# Patient Record
Sex: Female | Born: 1955
Health system: Southern US, Community
[De-identification: ages and names within clinical notes are randomized; demographics above are authoritative.]

## PROBLEM LIST (undated history)

## (undated) DIAGNOSIS — F32A Depression, unspecified: Secondary | ICD-10-CM

## (undated) DIAGNOSIS — I10 Essential (primary) hypertension: Secondary | ICD-10-CM

## (undated) DIAGNOSIS — E119 Type 2 diabetes mellitus without complications: Secondary | ICD-10-CM

## (undated) DIAGNOSIS — F329 Major depressive disorder, single episode, unspecified: Secondary | ICD-10-CM

## (undated) DIAGNOSIS — C50511 Malignant neoplasm of lower-outer quadrant of right female breast: Secondary | ICD-10-CM

## (undated) DIAGNOSIS — K219 Gastro-esophageal reflux disease without esophagitis: Secondary | ICD-10-CM

## (undated) DIAGNOSIS — Z803 Family history of malignant neoplasm of breast: Secondary | ICD-10-CM

## (undated) DIAGNOSIS — J302 Other seasonal allergic rhinitis: Secondary | ICD-10-CM

## (undated) DIAGNOSIS — J45909 Unspecified asthma, uncomplicated: Secondary | ICD-10-CM

## (undated) DIAGNOSIS — Z800 Family history of malignant neoplasm of digestive organs: Secondary | ICD-10-CM

## (undated) DIAGNOSIS — M1990 Unspecified osteoarthritis, unspecified site: Secondary | ICD-10-CM

## (undated) DIAGNOSIS — F419 Anxiety disorder, unspecified: Secondary | ICD-10-CM

## (undated) HISTORY — DX: Malignant neoplasm of lower-outer quadrant of right female breast: C50.511

## (undated) HISTORY — PX: DILATION AND CURETTAGE OF UTERUS: SHX78

## (undated) HISTORY — DX: Family history of malignant neoplasm of digestive organs: Z80.0

## (undated) HISTORY — PX: BREAST LUMPECTOMY: SHX2

## (undated) HISTORY — DX: Family history of malignant neoplasm of breast: Z80.3

## (undated) HISTORY — PX: JOINT REPLACEMENT: SHX530

---

## 1998-03-28 ENCOUNTER — Ambulatory Visit (HOSPITAL_COMMUNITY): Admission: RE | Admit: 1998-03-28 | Discharge: 1998-03-28 | Payer: Self-pay | Admitting: *Deleted

## 1998-07-11 ENCOUNTER — Other Ambulatory Visit: Admission: RE | Admit: 1998-07-11 | Discharge: 1998-07-11 | Payer: Self-pay | Admitting: Obstetrics and Gynecology

## 1999-04-14 ENCOUNTER — Encounter: Payer: Self-pay | Admitting: Obstetrics and Gynecology

## 1999-04-14 ENCOUNTER — Ambulatory Visit (HOSPITAL_COMMUNITY): Admission: RE | Admit: 1999-04-14 | Discharge: 1999-04-14 | Payer: Self-pay | Admitting: Obstetrics and Gynecology

## 1999-10-27 ENCOUNTER — Other Ambulatory Visit: Admission: RE | Admit: 1999-10-27 | Discharge: 1999-10-27 | Payer: Self-pay | Admitting: Obstetrics and Gynecology

## 2000-04-12 ENCOUNTER — Ambulatory Visit (HOSPITAL_COMMUNITY): Admission: RE | Admit: 2000-04-12 | Discharge: 2000-04-12 | Payer: Self-pay | Admitting: *Deleted

## 2000-04-12 ENCOUNTER — Encounter: Payer: Self-pay | Admitting: *Deleted

## 2000-12-26 ENCOUNTER — Other Ambulatory Visit: Admission: RE | Admit: 2000-12-26 | Discharge: 2000-12-26 | Payer: Self-pay | Admitting: Obstetrics and Gynecology

## 2002-02-20 ENCOUNTER — Other Ambulatory Visit: Admission: RE | Admit: 2002-02-20 | Discharge: 2002-02-20 | Payer: Self-pay | Admitting: Obstetrics and Gynecology

## 2002-03-01 ENCOUNTER — Encounter: Payer: Self-pay | Admitting: Obstetrics and Gynecology

## 2002-03-01 ENCOUNTER — Ambulatory Visit (HOSPITAL_COMMUNITY): Admission: RE | Admit: 2002-03-01 | Discharge: 2002-03-01 | Payer: Self-pay | Admitting: Obstetrics and Gynecology

## 2002-03-14 ENCOUNTER — Encounter (INDEPENDENT_AMBULATORY_CARE_PROVIDER_SITE_OTHER): Payer: Self-pay

## 2002-03-14 ENCOUNTER — Encounter: Admission: RE | Admit: 2002-03-14 | Discharge: 2002-03-14 | Payer: Self-pay | Admitting: Obstetrics and Gynecology

## 2002-03-14 ENCOUNTER — Encounter: Payer: Self-pay | Admitting: Obstetrics and Gynecology

## 2002-08-01 ENCOUNTER — Ambulatory Visit (HOSPITAL_COMMUNITY): Admission: RE | Admit: 2002-08-01 | Discharge: 2002-08-01 | Payer: Self-pay | Admitting: Family Medicine

## 2002-08-01 ENCOUNTER — Encounter: Payer: Self-pay | Admitting: Family Medicine

## 2002-08-03 ENCOUNTER — Encounter: Payer: Self-pay | Admitting: Internal Medicine

## 2002-08-03 ENCOUNTER — Encounter (HOSPITAL_COMMUNITY): Admission: RE | Admit: 2002-08-03 | Discharge: 2002-09-02 | Payer: Self-pay | Admitting: Internal Medicine

## 2002-08-13 ENCOUNTER — Encounter: Payer: Self-pay | Admitting: Family Medicine

## 2002-08-13 ENCOUNTER — Ambulatory Visit (HOSPITAL_COMMUNITY): Admission: RE | Admit: 2002-08-13 | Discharge: 2002-08-13 | Payer: Self-pay | Admitting: Family Medicine

## 2003-03-20 ENCOUNTER — Ambulatory Visit (HOSPITAL_COMMUNITY): Admission: RE | Admit: 2003-03-20 | Discharge: 2003-03-20 | Payer: Self-pay | Admitting: Obstetrics and Gynecology

## 2003-03-20 ENCOUNTER — Encounter: Payer: Self-pay | Admitting: Obstetrics and Gynecology

## 2003-03-28 ENCOUNTER — Other Ambulatory Visit: Admission: RE | Admit: 2003-03-28 | Discharge: 2003-03-28 | Payer: Self-pay | Admitting: Obstetrics and Gynecology

## 2003-08-12 ENCOUNTER — Ambulatory Visit (HOSPITAL_COMMUNITY): Admission: RE | Admit: 2003-08-12 | Discharge: 2003-08-12 | Payer: Self-pay | Admitting: Family Medicine

## 2004-03-31 ENCOUNTER — Ambulatory Visit (HOSPITAL_COMMUNITY): Admission: RE | Admit: 2004-03-31 | Discharge: 2004-03-31 | Payer: Self-pay | Admitting: Obstetrics and Gynecology

## 2004-11-23 ENCOUNTER — Other Ambulatory Visit: Admission: RE | Admit: 2004-11-23 | Discharge: 2004-11-23 | Payer: Self-pay | Admitting: Obstetrics and Gynecology

## 2005-04-01 ENCOUNTER — Ambulatory Visit (HOSPITAL_COMMUNITY): Admission: RE | Admit: 2005-04-01 | Discharge: 2005-04-01 | Payer: Self-pay | Admitting: Obstetrics and Gynecology

## 2006-03-14 ENCOUNTER — Ambulatory Visit: Payer: Self-pay | Admitting: Internal Medicine

## 2006-03-14 ENCOUNTER — Ambulatory Visit (HOSPITAL_COMMUNITY): Admission: RE | Admit: 2006-03-14 | Discharge: 2006-03-14 | Payer: Self-pay | Admitting: Internal Medicine

## 2006-04-13 ENCOUNTER — Ambulatory Visit (HOSPITAL_COMMUNITY): Admission: RE | Admit: 2006-04-13 | Discharge: 2006-04-13 | Payer: Self-pay | Admitting: Obstetrics and Gynecology

## 2007-04-17 ENCOUNTER — Ambulatory Visit (HOSPITAL_COMMUNITY): Admission: RE | Admit: 2007-04-17 | Discharge: 2007-04-17 | Payer: Self-pay | Admitting: Obstetrics & Gynecology

## 2008-01-16 ENCOUNTER — Encounter: Admission: RE | Admit: 2008-01-16 | Discharge: 2008-01-16 | Payer: Self-pay | Admitting: Obstetrics and Gynecology

## 2008-02-20 ENCOUNTER — Encounter: Admission: RE | Admit: 2008-02-20 | Discharge: 2008-02-20 | Payer: Self-pay | Admitting: Surgery

## 2009-03-04 ENCOUNTER — Ambulatory Visit (HOSPITAL_COMMUNITY): Admission: RE | Admit: 2009-03-04 | Discharge: 2009-03-04 | Payer: Self-pay | Admitting: Obstetrics and Gynecology

## 2010-03-05 ENCOUNTER — Ambulatory Visit (HOSPITAL_COMMUNITY): Admission: RE | Admit: 2010-03-05 | Discharge: 2010-03-05 | Payer: Self-pay | Admitting: Obstetrics and Gynecology

## 2010-09-28 ENCOUNTER — Encounter: Payer: Self-pay | Admitting: Obstetrics and Gynecology

## 2011-01-22 NOTE — Op Note (Signed)
NAMEZELIA, YZAGUIRRE                ACCOUNT NO.:  [id number]   MEDICAL RECORD NO.:  [id number]          PATIENT TYPE:  AMB   LOCATION:  DAY                           FACILITY:  [facility]   PHYSICIAN:  Lionel December, M.D.    DATE OF BIRTH:  April 17, 1956   DATE OF PROCEDURE:  03/14/2006  DATE OF DISCHARGE:                                 OPERATIVE REPORT   PROCEDURE:  Colonoscopy.   INDICATIONS:  Carolyn Glass is a 55 year old Caucasian female who is undergoing  average risk screening colonoscopy.   The procedure was reviewed with the patient and informed consent was  obtained.   MEDICATIONS FOR CONSCIOUS SEDATION:  Demerol 50 mg IV, Versed 6 mg IV,  divided dose.   FINDINGS:  Procedure performed in endoscopy suite.  The patient's vital  signs and O2 saturation were monitored during the procedure and remained  stable.  The patient was placed in left lateral recumbent position and  rectal examination performed.  No abnormality noted on external or digital  exam.  Olympus video scope was placed in the rectum and advanced under  vision into sigmoid colon and beyond.  Preparation was excellent.  The scope  was passed in the cecum which was identified by appendiceal orifice and  ileocecal valve.  Pictures taken for the record.  As the scope was  withdrawn, colonic mucosa was examined for the second time and was normal  throughout.  Rectal mucosa similarly was normal.  Scope was retroflexed to  examine anorectal junction and small hemorrhoids were noted below the  dentate line.  The endoscope was straightened and withdrawn.  The patient  tolerated the procedure well.   FINAL DIAGNOSIS:  Normal colonoscopy except small external hemorrhoids.   RECOMMENDATIONS:  1.  She will resume her usual medications and diet.  2.  Yearly hemoccults.  3.  She may consider next screening exam in ten years from now.      Lionel December, M.D.  Electronically Signed     NR/MEDQ  D:  03/14/2006  T:  03/14/2006   Job:  [id number]   cc:   Juluis Mire, M.D.  Fax: [phone]   Dr. Phillips Odor

## 2011-02-10 ENCOUNTER — Other Ambulatory Visit (HOSPITAL_COMMUNITY): Payer: Self-pay | Admitting: Obstetrics and Gynecology

## 2011-02-10 DIAGNOSIS — Z139 Encounter for screening, unspecified: Secondary | ICD-10-CM

## 2011-03-26 ENCOUNTER — Ambulatory Visit (HOSPITAL_COMMUNITY)
Admission: RE | Admit: 2011-03-26 | Discharge: 2011-03-26 | Disposition: A | Discharge status: Home / Self Care | Payer: BC Managed Care – PPO | Source: Ambulatory Visit | Attending: Obstetrics and Gynecology | Admitting: Obstetrics and Gynecology

## 2011-03-26 DIAGNOSIS — Z1231 Encounter for screening mammogram for malignant neoplasm of breast: Secondary | ICD-10-CM | POA: Insufficient documentation

## 2011-03-26 DIAGNOSIS — Z139 Encounter for screening, unspecified: Secondary | ICD-10-CM

## 2012-03-23 ENCOUNTER — Other Ambulatory Visit (HOSPITAL_COMMUNITY): Payer: Self-pay | Admitting: Obstetrics and Gynecology

## 2012-03-23 DIAGNOSIS — Z139 Encounter for screening, unspecified: Secondary | ICD-10-CM

## 2012-03-31 ENCOUNTER — Ambulatory Visit (HOSPITAL_COMMUNITY)
Admission: RE | Admit: 2012-03-31 | Discharge: 2012-03-31 | Disposition: A | Discharge status: Home / Self Care | Payer: BC Managed Care – PPO | Source: Ambulatory Visit | Attending: Obstetrics and Gynecology | Admitting: Obstetrics and Gynecology

## 2012-03-31 DIAGNOSIS — Z139 Encounter for screening, unspecified: Secondary | ICD-10-CM

## 2012-03-31 DIAGNOSIS — Z1231 Encounter for screening mammogram for malignant neoplasm of breast: Secondary | ICD-10-CM | POA: Insufficient documentation

## 2012-09-18 ENCOUNTER — Other Ambulatory Visit (HOSPITAL_COMMUNITY): Payer: Self-pay | Admitting: Family Medicine

## 2012-09-18 ENCOUNTER — Ambulatory Visit (HOSPITAL_COMMUNITY)
Admission: RE | Admit: 2012-09-18 | Discharge: 2012-09-18 | Disposition: A | Discharge status: Home / Self Care | Payer: BC Managed Care – PPO | Source: Ambulatory Visit | Attending: Family Medicine | Admitting: Family Medicine

## 2012-09-18 DIAGNOSIS — M546 Pain in thoracic spine: Secondary | ICD-10-CM | POA: Insufficient documentation

## 2012-09-18 DIAGNOSIS — M4120 Other idiopathic scoliosis, site unspecified: Secondary | ICD-10-CM

## 2012-12-29 ENCOUNTER — Ambulatory Visit (HOSPITAL_COMMUNITY)
Admission: RE | Admit: 2012-12-29 | Discharge: 2012-12-29 | Disposition: A | Discharge status: Home / Self Care | Payer: BC Managed Care – PPO | Source: Ambulatory Visit | Attending: Physician Assistant | Admitting: Physician Assistant

## 2012-12-29 ENCOUNTER — Other Ambulatory Visit (HOSPITAL_COMMUNITY): Payer: Self-pay | Admitting: Physician Assistant

## 2012-12-29 DIAGNOSIS — Y929 Unspecified place or not applicable: Secondary | ICD-10-CM | POA: Insufficient documentation

## 2012-12-29 DIAGNOSIS — W230XXA Caught, crushed, jammed, or pinched between moving objects, initial encounter: Secondary | ICD-10-CM | POA: Insufficient documentation

## 2012-12-29 DIAGNOSIS — R079 Chest pain, unspecified: Secondary | ICD-10-CM

## 2012-12-29 DIAGNOSIS — Y93K9 Activity, other involving animal care: Secondary | ICD-10-CM | POA: Insufficient documentation

## 2013-01-02 ENCOUNTER — Other Ambulatory Visit (HOSPITAL_COMMUNITY): Payer: Self-pay | Admitting: Sports Medicine

## 2013-01-02 DIAGNOSIS — M1611 Unilateral primary osteoarthritis, right hip: Secondary | ICD-10-CM

## 2013-01-05 ENCOUNTER — Ambulatory Visit (HOSPITAL_COMMUNITY): Admission: RE | Admit: 2013-01-05 | Payer: BC Managed Care – PPO | Source: Ambulatory Visit

## 2013-01-10 ENCOUNTER — Ambulatory Visit (HOSPITAL_COMMUNITY)
Admission: RE | Admit: 2013-01-10 | Discharge: 2013-01-10 | Disposition: A | Discharge status: Home / Self Care | Payer: BC Managed Care – PPO | Source: Ambulatory Visit | Attending: Sports Medicine | Admitting: Sports Medicine

## 2013-01-10 DIAGNOSIS — M1611 Unilateral primary osteoarthritis, right hip: Secondary | ICD-10-CM

## 2013-01-10 DIAGNOSIS — M1610 Unilateral primary osteoarthritis, unspecified hip: Secondary | ICD-10-CM | POA: Insufficient documentation

## 2013-01-10 DIAGNOSIS — M25559 Pain in unspecified hip: Secondary | ICD-10-CM | POA: Insufficient documentation

## 2013-01-10 DIAGNOSIS — M169 Osteoarthritis of hip, unspecified: Secondary | ICD-10-CM | POA: Insufficient documentation

## 2013-03-27 ENCOUNTER — Other Ambulatory Visit (HOSPITAL_COMMUNITY): Payer: Self-pay | Admitting: Obstetrics and Gynecology

## 2013-03-27 DIAGNOSIS — Z139 Encounter for screening, unspecified: Secondary | ICD-10-CM

## 2013-04-02 ENCOUNTER — Ambulatory Visit (HOSPITAL_COMMUNITY)
Admission: RE | Admit: 2013-04-02 | Discharge: 2013-04-02 | Disposition: A | Discharge status: Home / Self Care | Payer: BC Managed Care – PPO | Source: Ambulatory Visit | Attending: Obstetrics and Gynecology | Admitting: Obstetrics and Gynecology

## 2013-04-02 DIAGNOSIS — Z139 Encounter for screening, unspecified: Secondary | ICD-10-CM

## 2013-04-02 DIAGNOSIS — Z1231 Encounter for screening mammogram for malignant neoplasm of breast: Secondary | ICD-10-CM | POA: Insufficient documentation

## 2014-07-16 ENCOUNTER — Other Ambulatory Visit (HOSPITAL_COMMUNITY): Payer: Self-pay | Admitting: Family Medicine

## 2014-07-16 DIAGNOSIS — R591 Generalized enlarged lymph nodes: Secondary | ICD-10-CM

## 2014-07-18 ENCOUNTER — Other Ambulatory Visit (HOSPITAL_COMMUNITY): Payer: BC Managed Care – PPO

## 2014-07-18 ENCOUNTER — Ambulatory Visit (HOSPITAL_COMMUNITY): Payer: BC Managed Care – PPO

## 2014-12-31 ENCOUNTER — Other Ambulatory Visit (HOSPITAL_COMMUNITY): Payer: Self-pay | Admitting: Obstetrics and Gynecology

## 2014-12-31 DIAGNOSIS — Z1231 Encounter for screening mammogram for malignant neoplasm of breast: Secondary | ICD-10-CM

## 2015-01-20 ENCOUNTER — Ambulatory Visit (HOSPITAL_COMMUNITY)
Admission: RE | Admit: 2015-01-20 | Discharge: 2015-01-20 | Disposition: A | Discharge status: Home / Self Care | Payer: BC Managed Care – PPO | Source: Ambulatory Visit | Attending: Obstetrics and Gynecology | Admitting: Obstetrics and Gynecology

## 2015-01-20 DIAGNOSIS — Z1231 Encounter for screening mammogram for malignant neoplasm of breast: Secondary | ICD-10-CM

## 2015-03-25 ENCOUNTER — Other Ambulatory Visit: Payer: Self-pay | Admitting: Obstetrics and Gynecology

## 2015-03-26 LAB — CYTOLOGY - PAP

## 2015-12-14 ENCOUNTER — Ambulatory Visit: Payer: Self-pay | Admitting: Orthopedic Surgery

## 2015-12-14 NOTE — Progress Notes (Signed)
Preoperative surgical orders have been place into the Epic hospital system for Carolyn Glass on 12/14/2015, 7:57 PM  by Mickel Crow for surgery on 5//3/17.  Preop Total Hip - Anterior Approach orders including IV Tylenol, and IV Decadron as long as there are no contraindications to the above medications. Arlee Muslim, PA-C

## 2015-12-25 NOTE — Patient Instructions (Signed)
SHARESE LODES  12/25/2015   Your procedure is scheduled on: 01/07/2016    Report to [facility]  Entrance take [facility]  elevators to 3rd floor to  [place] at     130pm  Call this number if you have problems the morning of surgery [phone]   Remember: ONLY 1 PERSON MAY GO WITH YOU TO SHORT STAY TO GET  READY MORNING OF YOUR SURGERY.  Do not eat food after midnite.  May have clear liquids from 12 midnite until 0830am then nothing by mouth.       Take these medicines the morning of surgery with A SIP OF WATER: Albuterol Inhaler if needed and bring, Lexapro, Allegra, Hydrocodone if needed, Prilosec                                 You may not have any metal on your body including hair pins and              piercings  Do not wear jewelry, make-up, lotions, powders or perfumes, deodorant             Do not wear nail polish.  Do not shave  48 hours prior to surgery.                 Do not bring valuables to the hospital. [place].  Contacts, dentures or bridgework may not be worn into surgery.  Leave suitcase in the car. After surgery it may be brought to your room.         Special Instructions: coughing and deep breathing exercises, leg exercises               Please read over the following fact sheets you were given: _____________________________________________________________________                CLEAR LIQUID DIET   Foods Allowed                                                                     Foods Excluded  Coffee and tea, regular and decaf                             liquids that you cannot  Plain Jell-O in any flavor                                             see through such as: Fruit ices (not with fruit pulp)                                     milk, soups, orange juice  Iced Popsicles  All solid food Carbonated beverages, regular and diet                                     Cranberry, grape and apple juices Sports drinks like Gatorade Lightly seasoned clear broth or consume(fat free) Sugar, honey syrup  Sample Menu Breakfast                                Lunch                                     Supper Cranberry juice                    Beef broth                            Chicken broth Jell-O                                     Grape juice                           Apple juice Coffee or tea                        Jell-O                                      Popsicle                                                Coffee or tea                        Coffee or tea  _____________________________________________________________________  [facility] - Preparing for Surgery Before surgery, you can play an important role.  Because skin is not sterile, your skin needs to be as free of germs as possible.  You can reduce the number of germs on your skin by washing with CHG (chlorahexidine gluconate) soap before surgery.  CHG is an antiseptic cleaner which kills germs and bonds with the skin to continue killing germs even after washing. Please DO NOT use if you have an allergy to CHG or antibacterial soaps.  If your skin becomes reddened/irritated stop using the CHG and inform your nurse when you arrive at Short Stay. Do not shave (including legs and underarms) for at least 48 hours prior to the first CHG shower.  You may shave your face/neck. Please follow these instructions carefully:  1.  Shower with CHG Soap the night before surgery and the  morning of Surgery.  2.  If you choose to wash your hair, wash your hair first as usual with your  normal  shampoo.  3.  After you shampoo, rinse your hair and body thoroughly to remove the  shampoo.  4.  Use CHG as you would any other liquid soap.  You can apply chg directly  to the skin and wash                       Gently with a scrungie or clean washcloth.  5.   Apply the CHG Soap to your body ONLY FROM THE NECK DOWN.   Do not use on face/ open                           Wound or open sores. Avoid contact with eyes, ears mouth and genitals (private parts).                       Wash face,  Genitals (private parts) with your normal soap.             6.  Wash thoroughly, paying special attention to the area where your surgery  will be performed.  7.  Thoroughly rinse your body with warm water from the neck down.  8.  DO NOT shower/wash with your normal soap after using and rinsing off  the CHG Soap.                9.  Pat yourself dry with a clean towel.            10.  Wear clean pajamas.            11.  Place clean sheets on your bed the night of your first shower and do not  sleep with pets. Day of Surgery : Do not apply any lotions/deodorants the morning of surgery.  Please wear clean clothes to the hospital/surgery center.  FAILURE TO FOLLOW THESE INSTRUCTIONS MAY RESULT IN THE CANCELLATION OF YOUR SURGERY PATIENT SIGNATURE_________________________________  NURSE SIGNATURE__________________________________  ________________________________________________________________________  WHAT IS A BLOOD TRANSFUSION? Blood Transfusion Information  A transfusion is the replacement of blood or some of its parts. Blood is made up of multiple cells which provide different functions.  Red blood cells carry oxygen and are used for blood loss replacement.  White blood cells fight against infection.  Platelets control bleeding.  Plasma helps clot blood.  Other blood products are available for specialized needs, such as hemophilia or other clotting disorders. BEFORE THE TRANSFUSION  Who gives blood for transfusions?   Healthy volunteers who are fully evaluated to make sure their blood is safe. This is blood bank blood. Transfusion therapy is the safest it has ever been in the practice of medicine. Before blood is taken from a donor, a complete history is  taken to make sure that person has no history of diseases nor engages in risky social behavior (examples are intravenous drug use or sexual activity with multiple partners). The donor's travel history is screened to minimize risk of transmitting infections, such as malaria. The donated blood is tested for signs of infectious diseases, such as HIV and hepatitis. The blood is then tested to be sure it is compatible with you in order to minimize the chance of a transfusion reaction. If you or a relative donates blood, this is often done in anticipation of surgery and is not appropriate for emergency situations. It takes many days to process the donated blood. RISKS AND COMPLICATIONS Although transfusion therapy is very safe and saves many lives, the main dangers of transfusion include:  1. Getting an infectious disease. 2. Developing a transfusion reaction.  This is an allergic reaction to something in the blood you were given. Every precaution is taken to prevent this. The decision to have a blood transfusion has been considered carefully by your caregiver before blood is given. Blood is not given unless the benefits outweigh the risks. AFTER THE TRANSFUSION  Right after receiving a blood transfusion, you will usually feel much better and more energetic. This is especially true if your red blood cells have gotten low (anemic). The transfusion raises the level of the red blood cells which carry oxygen, and this usually causes an energy increase.  The nurse administering the transfusion will monitor you carefully for complications. HOME CARE INSTRUCTIONS  No special instructions are needed after a transfusion. You may find your energy is better. Speak with your caregiver about any limitations on activity for underlying diseases you may have. SEEK MEDICAL CARE IF:   Your condition is not improving after your transfusion.  You develop redness or irritation at the intravenous (IV) site. SEEK IMMEDIATE  MEDICAL CARE IF:  Any of the following symptoms occur over the next 12 hours:  Shaking chills.  You have a temperature by mouth above 102 F (38.9 C), not controlled by medicine.  Chest, back, or muscle pain.  People around you feel you are not acting correctly or are confused.  Shortness of breath or difficulty breathing.  Dizziness and fainting.  You get a rash or develop hives.  You have a decrease in urine output.  Your urine turns a dark color or changes to pink, red, or brown. Any of the following symptoms occur over the next 10 days:  You have a temperature by mouth above 102 F (38.9 C), not controlled by medicine.  Shortness of breath.  Weakness after normal activity.  The white part of the eye turns yellow (jaundice).  You have a decrease in the amount of urine or are urinating less often.  Your urine turns a dark color or changes to pink, red, or brown. Document Released: 08/20/2000 Document Revised: 11/15/2011 Document Reviewed: 04/08/2008 ExitCare Patient Information 2014 [place].  _______________________________________________________________________  Incentive Spirometer  An incentive spirometer is a tool that can help keep your lungs clear and active. This tool measures how well you are filling your lungs with each breath. Taking long deep breaths may help reverse or decrease the chance of developing breathing (pulmonary) problems (especially infection) following:  A long period of time when you are unable to move or be active. BEFORE THE PROCEDURE   If the spirometer includes an indicator to show your best effort, your nurse or respiratory therapist will set it to a desired goal.  If possible, sit up straight or lean slightly forward. Try not to slouch.  Hold the incentive spirometer in an upright position. INSTRUCTIONS FOR USE  3. Sit on the edge of your bed if possible, or sit up as far as you can in bed or on a chair. 4. Hold the  incentive spirometer in an upright position. 5. Breathe out normally. 6. Place the mouthpiece in your mouth and seal your lips tightly around it. 7. Breathe in slowly and as deeply as possible, raising the piston or the ball toward the top of the column. 8. Hold your breath for 3-5 seconds or for as long as possible. Allow the piston or ball to fall to the bottom of the column. 9. Remove the mouthpiece from your mouth and breathe out normally. 10. Rest for a few seconds and repeat Steps  1 through 7 at least 10 times every 1-2 hours when you are awake. Take your time and take a few normal breaths between deep breaths. 11. The spirometer may include an indicator to show your best effort. Use the indicator as a goal to work toward during each repetition. 12. After each set of 10 deep breaths, practice coughing to be sure your lungs are clear. If you have an incision (the cut made at the time of surgery), support your incision when coughing by placing a pillow or rolled up towels firmly against it. Once you are able to get out of bed, walk around indoors and cough well. You may stop using the incentive spirometer when instructed by your caregiver.  RISKS AND COMPLICATIONS  Take your time so you do not get dizzy or light-headed.  If you are in pain, you may need to take or ask for pain medication before doing incentive spirometry. It is harder to take a deep breath if you are having pain. AFTER USE  Rest and breathe slowly and easily.  It can be helpful to keep track of a log of your progress. Your caregiver can provide you with a simple table to help with this. If you are using the spirometer at home, follow these instructions: [place] IF:   You are having difficultly using the spirometer.  You have trouble using the spirometer as often as instructed.  Your pain medication is not giving enough relief while using the spirometer.  You develop fever of 100.5 F (38.1 C) or  higher. SEEK IMMEDIATE MEDICAL CARE IF:   You cough up bloody sputum that had not been present before.  You develop fever of 102 F (38.9 C) or greater.  You develop worsening pain at or near the incision site. MAKE SURE YOU:   Understand these instructions.  Will watch your condition.  Will get help right away if you are not doing well or get worse. Document Released: 01/03/2007 Document Revised: 11/15/2011 Document Reviewed: 03/06/2007 [facility] Patient Information 2014 [place], [place].   ________________________________________________________________________

## 2015-12-29 ENCOUNTER — Encounter (HOSPITAL_COMMUNITY): Payer: Self-pay

## 2015-12-29 ENCOUNTER — Encounter (HOSPITAL_COMMUNITY)
Admission: RE | Admit: 2015-12-29 | Discharge: 2015-12-29 | Disposition: A | Discharge status: Home / Self Care | Payer: BC Managed Care – PPO | Source: Ambulatory Visit | Attending: Orthopedic Surgery | Admitting: Orthopedic Surgery

## 2015-12-29 DIAGNOSIS — M1611 Unilateral primary osteoarthritis, right hip: Secondary | ICD-10-CM | POA: Diagnosis not present

## 2015-12-29 DIAGNOSIS — Z01812 Encounter for preprocedural laboratory examination: Secondary | ICD-10-CM | POA: Insufficient documentation

## 2015-12-29 DIAGNOSIS — Z01818 Encounter for other preprocedural examination: Secondary | ICD-10-CM | POA: Insufficient documentation

## 2015-12-29 DIAGNOSIS — Z0183 Encounter for blood typing: Secondary | ICD-10-CM | POA: Insufficient documentation

## 2015-12-29 HISTORY — DX: Gastro-esophageal reflux disease without esophagitis: K21.9

## 2015-12-29 HISTORY — DX: Unspecified osteoarthritis, unspecified site: M19.90

## 2015-12-29 HISTORY — DX: Type 2 diabetes mellitus without complications: E11.9

## 2015-12-29 HISTORY — DX: Unspecified asthma, uncomplicated: J45.909

## 2015-12-29 HISTORY — DX: Essential (primary) hypertension: I10

## 2015-12-29 LAB — SURGICAL PCR SCREEN
MRSA, PCR: NEGATIVE
Staphylococcus aureus: NEGATIVE

## 2015-12-29 LAB — COMPREHENSIVE METABOLIC PANEL
ALT: 20 U/L (ref 14–54)
ANION GAP: 9 (ref 5–15)
AST: 21 U/L (ref 15–41)
Albumin: 4.2 g/dL (ref 3.5–5.0)
Alkaline Phosphatase: 101 U/L (ref 38–126)
BUN: 10 mg/dL (ref 6–20)
CHLORIDE: 105 mmol/L (ref 101–111)
CO2: 25 mmol/L (ref 22–32)
CREATININE: 0.54 mg/dL (ref 0.44–1.00)
Calcium: 9.4 mg/dL (ref 8.9–10.3)
Glucose, Bld: 115 mg/dL — ABNORMAL HIGH (ref 65–99)
POTASSIUM: 4.3 mmol/L (ref 3.5–5.1)
Sodium: 139 mmol/L (ref 135–145)
Total Bilirubin: 0.7 mg/dL (ref 0.3–1.2)
Total Protein: 7.6 g/dL (ref 6.5–8.1)

## 2015-12-29 LAB — URINALYSIS, ROUTINE W REFLEX MICROSCOPIC
Bilirubin Urine: NEGATIVE
GLUCOSE, UA: 100 mg/dL — AB
KETONES UR: NEGATIVE mg/dL
Nitrite: NEGATIVE
PH: 6 (ref 5.0–8.0)
Protein, ur: NEGATIVE mg/dL
Specific Gravity, Urine: 1.012 (ref 1.005–1.030)

## 2015-12-29 LAB — APTT: aPTT: 33 seconds (ref 24–37)

## 2015-12-29 LAB — CBC
HCT: 42.3 % (ref 36.0–46.0)
Hemoglobin: 14 g/dL (ref 12.0–15.0)
MCH: 31.4 pg (ref 26.0–34.0)
MCHC: 33.1 g/dL (ref 30.0–36.0)
MCV: 94.8 fL (ref 78.0–100.0)
PLATELETS: 262 10*3/uL (ref 150–400)
RBC: 4.46 MIL/uL (ref 3.87–5.11)
RDW: 13.6 % (ref 11.5–15.5)
WBC: 9.4 10*3/uL (ref 4.0–10.5)

## 2015-12-29 LAB — URINE MICROSCOPIC-ADD ON: SQUAMOUS EPITHELIAL / LPF: NONE SEEN

## 2015-12-29 LAB — ABO/RH: ABO/RH(D): O POS

## 2015-12-29 LAB — PROTIME-INR
INR: 1.03 (ref 0.00–1.49)
PROTHROMBIN TIME: 13.3 s (ref 11.6–15.2)

## 2015-12-29 NOTE — Progress Notes (Signed)
Final EKG done 12/29/2015- EPIC

## 2015-12-29 NOTE — Progress Notes (Signed)
HGA1C done 12/01/15 on chart = 6.9

## 2015-12-29 NOTE — Progress Notes (Signed)
U/A and micro results done 12/29/2015 faxed via EPIC to Dr Wynelle Link.  Patient is being seen by her PCP- Dr Hilma Favors at [facility] on 12/29/2015 for " blood in urine".

## 2016-01-07 ENCOUNTER — Inpatient Hospital Stay (HOSPITAL_COMMUNITY): Payer: BC Managed Care – PPO

## 2016-01-07 ENCOUNTER — Encounter (HOSPITAL_COMMUNITY): Payer: Self-pay | Admitting: *Deleted

## 2016-01-07 ENCOUNTER — Encounter (HOSPITAL_COMMUNITY)
Admission: RE | Disposition: A | Discharge status: Home Health | Payer: Self-pay | Source: Ambulatory Visit | Attending: Orthopedic Surgery

## 2016-01-07 ENCOUNTER — Ambulatory Visit: Payer: Self-pay | Admitting: Orthopedic Surgery

## 2016-01-07 ENCOUNTER — Inpatient Hospital Stay (HOSPITAL_COMMUNITY): Payer: BC Managed Care – PPO | Admitting: Anesthesiology

## 2016-01-07 ENCOUNTER — Inpatient Hospital Stay (HOSPITAL_COMMUNITY)
Admission: RE | Admit: 2016-01-07 | Discharge: 2016-01-09 | DRG: 470 | Disposition: A | Discharge status: Home Health | Payer: BC Managed Care – PPO | Source: Ambulatory Visit | Attending: Orthopedic Surgery | Admitting: Orthopedic Surgery

## 2016-01-07 DIAGNOSIS — M1611 Unilateral primary osteoarthritis, right hip: Secondary | ICD-10-CM

## 2016-01-07 DIAGNOSIS — K219 Gastro-esophageal reflux disease without esophagitis: Secondary | ICD-10-CM | POA: Diagnosis present

## 2016-01-07 DIAGNOSIS — J45909 Unspecified asthma, uncomplicated: Secondary | ICD-10-CM | POA: Diagnosis present

## 2016-01-07 DIAGNOSIS — E119 Type 2 diabetes mellitus without complications: Secondary | ICD-10-CM | POA: Diagnosis present

## 2016-01-07 DIAGNOSIS — Z7984 Long term (current) use of oral hypoglycemic drugs: Secondary | ICD-10-CM | POA: Diagnosis not present

## 2016-01-07 DIAGNOSIS — Z96649 Presence of unspecified artificial hip joint: Secondary | ICD-10-CM

## 2016-01-07 DIAGNOSIS — M169 Osteoarthritis of hip, unspecified: Secondary | ICD-10-CM | POA: Diagnosis present

## 2016-01-07 DIAGNOSIS — I10 Essential (primary) hypertension: Secondary | ICD-10-CM | POA: Diagnosis present

## 2016-01-07 DIAGNOSIS — M25551 Pain in right hip: Secondary | ICD-10-CM | POA: Diagnosis present

## 2016-01-07 HISTORY — PX: TOTAL HIP ARTHROPLASTY: SHX124

## 2016-01-07 LAB — TYPE AND SCREEN
ABO/RH(D): O POS
ANTIBODY SCREEN: NEGATIVE

## 2016-01-07 LAB — GLUCOSE, CAPILLARY: GLUCOSE-CAPILLARY: 122 mg/dL — AB (ref 65–99)

## 2016-01-07 SURGERY — ARTHROPLASTY, HIP, TOTAL, ANTERIOR APPROACH
Anesthesia: Spinal | Site: Hip | Laterality: Right

## 2016-01-07 MED ORDER — LACTATED RINGERS IV SOLN
INTRAVENOUS | Status: DC
  Administered 2016-01-07 (×3): via INTRAVENOUS

## 2016-01-07 MED ORDER — LORATADINE 10 MG PO TABS
10.0000 mg | ORAL_TABLET | Freq: Every day | ORAL | Status: DC
  Administered 2016-01-08 – 2016-01-09 (×2): 10 mg via ORAL
  Filled 2016-01-07 (×2): qty 1

## 2016-01-07 MED ORDER — METOCLOPRAMIDE HCL 10 MG PO TABS: 5.0000 mg | ORAL_TABLET | Freq: Three times a day (TID) | ORAL | Status: DC | PRN

## 2016-01-07 MED ORDER — HYDROMORPHONE HCL 2 MG/ML IJ SOLN
INTRAMUSCULAR | Status: AC
Start: 2016-01-07 — End: 2016-01-07
  Filled 2016-01-07: qty 1

## 2016-01-07 MED ORDER — METHOCARBAMOL 1000 MG/10ML IJ SOLN
500.0000 mg | Freq: Four times a day (QID) | INTRAVENOUS | Status: DC | PRN
  Administered 2016-01-07: 500 mg via INTRAVENOUS
  Filled 2016-01-07: qty 550
  Filled 2016-01-07: qty 5

## 2016-01-07 MED ORDER — HYDROMORPHONE HCL 1 MG/ML IJ SOLN
INTRAMUSCULAR | Status: DC | PRN
  Administered 2016-01-07 (×2): 1 mg via INTRAVENOUS

## 2016-01-07 MED ORDER — ALBUTEROL SULFATE (2.5 MG/3ML) 0.083% IN NEBU: 3.0000 mL | INHALATION_SOLUTION | Freq: Four times a day (QID) | RESPIRATORY_TRACT | Status: DC | PRN

## 2016-01-07 MED ORDER — MIDAZOLAM HCL 2 MG/2ML IJ SOLN
INTRAMUSCULAR | Status: AC
  Filled 2016-01-07: qty 2

## 2016-01-07 MED ORDER — CEFAZOLIN SODIUM-DEXTROSE 2-4 GM/100ML-% IV SOLN
2.0000 g | Freq: Four times a day (QID) | INTRAVENOUS | Status: AC
  Administered 2016-01-07 – 2016-01-08 (×2): 2 g via INTRAVENOUS
  Filled 2016-01-07 (×2): qty 100

## 2016-01-07 MED ORDER — HYDROMORPHONE HCL 2 MG PO TABS
2.0000 mg | ORAL_TABLET | ORAL | Status: DC | PRN
  Administered 2016-01-07 – 2016-01-08 (×3): 2 mg via ORAL
  Filled 2016-01-07 (×3): qty 1

## 2016-01-07 MED ORDER — ONDANSETRON HCL 4 MG/2ML IJ SOLN
INTRAMUSCULAR | Status: DC | PRN
  Administered 2016-01-07: 4 mg via INTRAVENOUS

## 2016-01-07 MED ORDER — PROPOFOL 10 MG/ML IV BOLUS
INTRAVENOUS | Status: AC
  Filled 2016-01-07: qty 20

## 2016-01-07 MED ORDER — ACETAMINOPHEN 650 MG RE SUPP: 650.0000 mg | Freq: Four times a day (QID) | RECTAL | Status: DC | PRN

## 2016-01-07 MED ORDER — FENTANYL CITRATE (PF) 100 MCG/2ML IJ SOLN: 25.0000 ug | INTRAMUSCULAR | Status: DC | PRN

## 2016-01-07 MED ORDER — BUPIVACAINE HCL (PF) 0.25 % IJ SOLN
INTRAMUSCULAR | Status: AC
  Filled 2016-01-07: qty 30

## 2016-01-07 MED ORDER — DEXAMETHASONE SODIUM PHOSPHATE 10 MG/ML IJ SOLN
INTRAMUSCULAR | Status: AC
  Filled 2016-01-07: qty 1

## 2016-01-07 MED ORDER — SODIUM CHLORIDE 0.9 % IV SOLN
INTRAVENOUS | Status: DC
  Administered 2016-01-07: 22:00:00 via INTRAVENOUS

## 2016-01-07 MED ORDER — ONDANSETRON HCL 4 MG PO TABS: 4.0000 mg | ORAL_TABLET | Freq: Four times a day (QID) | ORAL | Status: DC | PRN

## 2016-01-07 MED ORDER — PROPOFOL 10 MG/ML IV BOLUS
INTRAVENOUS | Status: DC | PRN
  Administered 2016-01-07: 20 mg via INTRAVENOUS

## 2016-01-07 MED ORDER — METFORMIN HCL 500 MG PO TABS
500.0000 mg | ORAL_TABLET | Freq: Two times a day (BID) | ORAL | Status: DC
  Administered 2016-01-08 – 2016-01-09 (×3): 500 mg via ORAL
  Filled 2016-01-07 (×5): qty 1

## 2016-01-07 MED ORDER — 0.9 % SODIUM CHLORIDE (POUR BTL) OPTIME
TOPICAL | Status: DC | PRN
  Administered 2016-01-07: 1000 mL

## 2016-01-07 MED ORDER — ACETAMINOPHEN 500 MG PO TABS
1000.0000 mg | ORAL_TABLET | Freq: Four times a day (QID) | ORAL | Status: AC
  Administered 2016-01-07 – 2016-01-08 (×4): 1000 mg via ORAL
  Filled 2016-01-07 (×5): qty 2

## 2016-01-07 MED ORDER — MIDAZOLAM HCL 5 MG/5ML IJ SOLN
INTRAMUSCULAR | Status: DC | PRN
  Administered 2016-01-07: 1 mg via INTRAVENOUS
  Administered 2016-01-07: 2 mg via INTRAVENOUS

## 2016-01-07 MED ORDER — SODIUM CHLORIDE 0.9 % IV SOLN
1000.0000 mg | INTRAVENOUS | Status: AC
  Administered 2016-01-07: 1000 mg via INTRAVENOUS
  Filled 2016-01-07: qty 10

## 2016-01-07 MED ORDER — ONDANSETRON HCL 4 MG/2ML IJ SOLN
INTRAMUSCULAR | Status: AC
  Filled 2016-01-07: qty 2

## 2016-01-07 MED ORDER — FENTANYL CITRATE (PF) 100 MCG/2ML IJ SOLN
INTRAMUSCULAR | Status: DC | PRN
  Administered 2016-01-07 (×2): 50 ug via INTRAVENOUS

## 2016-01-07 MED ORDER — ACETAMINOPHEN 10 MG/ML IV SOLN
1000.0000 mg | Freq: Once | INTRAVENOUS | Status: AC
  Administered 2016-01-07: 1000 mg via INTRAVENOUS

## 2016-01-07 MED ORDER — PHENOL 1.4 % MT LIQD: 1.0000 | OROMUCOSAL | Status: DC | PRN

## 2016-01-07 MED ORDER — HYDROMORPHONE HCL 1 MG/ML IJ SOLN: 0.5000 mg | INTRAMUSCULAR | Status: DC | PRN

## 2016-01-07 MED ORDER — LACTATED RINGERS IV SOLN: INTRAVENOUS | Status: DC

## 2016-01-07 MED ORDER — CEFAZOLIN SODIUM-DEXTROSE 2-4 GM/100ML-% IV SOLN
2.0000 g | INTRAVENOUS | Status: AC
  Administered 2016-01-07: 2 g via INTRAVENOUS
  Filled 2016-01-07: qty 100

## 2016-01-07 MED ORDER — MEPERIDINE HCL 50 MG/ML IJ SOLN: 6.2500 mg | INTRAMUSCULAR | Status: DC | PRN

## 2016-01-07 MED ORDER — INSULIN ASPART 100 UNIT/ML ~~LOC~~ SOLN
0.0000 [IU] | Freq: Three times a day (TID) | SUBCUTANEOUS | Status: DC
Start: 2016-01-08 — End: 2016-01-09
  Administered 2016-01-08: 2 [IU] via SUBCUTANEOUS
  Administered 2016-01-08 – 2016-01-09 (×2): 3 [IU] via SUBCUTANEOUS

## 2016-01-07 MED ORDER — PANTOPRAZOLE SODIUM 40 MG PO TBEC
80.0000 mg | DELAYED_RELEASE_TABLET | Freq: Every day | ORAL | Status: DC
  Filled 2016-01-07: qty 2

## 2016-01-07 MED ORDER — FLEET ENEMA 7-19 GM/118ML RE ENEM: 1.0000 | ENEMA | Freq: Once | RECTAL | Status: DC | PRN

## 2016-01-07 MED ORDER — CEFAZOLIN SODIUM-DEXTROSE 2-4 GM/100ML-% IV SOLN
INTRAVENOUS | Status: AC
  Filled 2016-01-07: qty 100

## 2016-01-07 MED ORDER — METHOCARBAMOL 500 MG PO TABS: 500.0000 mg | ORAL_TABLET | Freq: Four times a day (QID) | ORAL | Status: DC | PRN

## 2016-01-07 MED ORDER — ACETAMINOPHEN 325 MG PO TABS: 650.0000 mg | ORAL_TABLET | Freq: Four times a day (QID) | ORAL | Status: DC | PRN

## 2016-01-07 MED ORDER — PROPOFOL 10 MG/ML IV BOLUS
INTRAVENOUS | Status: AC
  Filled 2016-01-07: qty 40

## 2016-01-07 MED ORDER — PROPOFOL 500 MG/50ML IV EMUL
INTRAVENOUS | Status: DC | PRN
  Administered 2016-01-07: 100 ug/kg/min via INTRAVENOUS

## 2016-01-07 MED ORDER — ESCITALOPRAM OXALATE 20 MG PO TABS
20.0000 mg | ORAL_TABLET | Freq: Every day | ORAL | Status: DC
  Administered 2016-01-08 – 2016-01-09 (×2): 20 mg via ORAL
  Filled 2016-01-07 (×2): qty 1

## 2016-01-07 MED ORDER — DEXAMETHASONE SODIUM PHOSPHATE 10 MG/ML IJ SOLN
10.0000 mg | Freq: Once | INTRAMUSCULAR | Status: AC
  Administered 2016-01-07: 10 mg via INTRAVENOUS

## 2016-01-07 MED ORDER — MENTHOL 3 MG MT LOZG: 1.0000 | LOZENGE | OROMUCOSAL | Status: DC | PRN

## 2016-01-07 MED ORDER — STERILE WATER FOR IRRIGATION IR SOLN
Status: DC | PRN
  Administered 2016-01-07: 3000 mL

## 2016-01-07 MED ORDER — DOCUSATE SODIUM 100 MG PO CAPS
100.0000 mg | ORAL_CAPSULE | Freq: Two times a day (BID) | ORAL | Status: DC
  Administered 2016-01-07 – 2016-01-09 (×4): 100 mg via ORAL

## 2016-01-07 MED ORDER — RIVAROXABAN 10 MG PO TABS
10.0000 mg | ORAL_TABLET | Freq: Every day | ORAL | Status: DC
  Administered 2016-01-08 – 2016-01-09 (×2): 10 mg via ORAL
  Filled 2016-01-07 (×3): qty 1

## 2016-01-07 MED ORDER — TRANEXAMIC ACID 1000 MG/10ML IV SOLN
1000.0000 mg | Freq: Once | INTRAVENOUS | Status: AC
  Administered 2016-01-07: 1000 mg via INTRAVENOUS
  Filled 2016-01-07: qty 10

## 2016-01-07 MED ORDER — BUPIVACAINE HCL (PF) 0.25 % IJ SOLN
INTRAMUSCULAR | Status: DC | PRN
  Administered 2016-01-07: 30 mL

## 2016-01-07 MED ORDER — PHENYLEPHRINE HCL 10 MG/ML IJ SOLN
10.0000 mg | INTRAVENOUS | Status: DC | PRN
  Administered 2016-01-07: 40 ug/min via INTRAVENOUS

## 2016-01-07 MED ORDER — CHLORHEXIDINE GLUCONATE 4 % EX LIQD: 60.0000 mL | Freq: Once | CUTANEOUS | Status: DC

## 2016-01-07 MED ORDER — SODIUM CHLORIDE 0.9 % IV SOLN: INTRAVENOUS | Status: DC

## 2016-01-07 MED ORDER — DEXAMETHASONE SODIUM PHOSPHATE 10 MG/ML IJ SOLN
10.0000 mg | Freq: Once | INTRAMUSCULAR | Status: AC
  Administered 2016-01-08: 10 mg via INTRAVENOUS
  Filled 2016-01-07: qty 1

## 2016-01-07 MED ORDER — PHENYLEPHRINE HCL 10 MG/ML IJ SOLN
INTRAMUSCULAR | Status: AC
  Filled 2016-01-07: qty 1

## 2016-01-07 MED ORDER — BISACODYL 10 MG RE SUPP: 10.0000 mg | Freq: Every day | RECTAL | Status: DC | PRN

## 2016-01-07 MED ORDER — POLYETHYLENE GLYCOL 3350 17 G PO PACK: 17.0000 g | PACK | Freq: Every day | ORAL | Status: DC | PRN

## 2016-01-07 MED ORDER — FENTANYL CITRATE (PF) 100 MCG/2ML IJ SOLN
INTRAMUSCULAR | Status: AC
  Filled 2016-01-07: qty 2

## 2016-01-07 MED ORDER — ONDANSETRON HCL 4 MG/2ML IJ SOLN: 4.0000 mg | Freq: Four times a day (QID) | INTRAMUSCULAR | Status: DC | PRN

## 2016-01-07 MED ORDER — METOCLOPRAMIDE HCL 5 MG/ML IJ SOLN: 5.0000 mg | Freq: Three times a day (TID) | INTRAMUSCULAR | Status: DC | PRN

## 2016-01-07 MED ORDER — ACETAMINOPHEN 10 MG/ML IV SOLN
INTRAVENOUS | Status: AC
  Filled 2016-01-07: qty 100

## 2016-01-07 MED ORDER — BUPIVACAINE IN DEXTROSE 0.75-8.25 % IT SOLN
INTRATHECAL | Status: DC | PRN
  Administered 2016-01-07: 1.8 mL via INTRATHECAL

## 2016-01-07 MED ORDER — DIPHENHYDRAMINE HCL 12.5 MG/5ML PO ELIX: 12.5000 mg | ORAL_SOLUTION | ORAL | Status: DC | PRN

## 2016-01-07 MED ORDER — METOCLOPRAMIDE HCL 5 MG/ML IJ SOLN: 10.0000 mg | Freq: Once | INTRAMUSCULAR | Status: DC | PRN

## 2016-01-07 SURGICAL SUPPLY — 33 items
BAG DECANTER FOR FLEXI CONT (MISCELLANEOUS) ×2 IMPLANT
BAG SPEC THK2 15X12 ZIP CLS (MISCELLANEOUS)
BAG ZIPLOCK 12X15 (MISCELLANEOUS) IMPLANT
BLADE SAG 18X100X1.27 (BLADE) ×2 IMPLANT
CAPT HIP TOTAL 2 ×1 IMPLANT
CLOTH BEACON ORANGE TIMEOUT ST (SAFETY) ×2 IMPLANT
COVER PERINEAL POST (MISCELLANEOUS) ×2 IMPLANT
DECANTER SPIKE VIAL GLASS SM (MISCELLANEOUS) ×2 IMPLANT
DRAPE STERI IOBAN 125X83 (DRAPES) ×2 IMPLANT
DRAPE U-SHAPE 47X51 STRL (DRAPES) ×4 IMPLANT
DRSG ADAPTIC 3X8 NADH LF (GAUZE/BANDAGES/DRESSINGS) ×2 IMPLANT
DRSG MEPILEX BORDER 4X4 (GAUZE/BANDAGES/DRESSINGS) ×2 IMPLANT
DRSG MEPILEX BORDER 4X8 (GAUZE/BANDAGES/DRESSINGS) ×2 IMPLANT
DURAPREP 26ML APPLICATOR (WOUND CARE) ×2 IMPLANT
ELECT REM PT RETURN 9FT ADLT (ELECTROSURGICAL) ×2
ELECTRODE REM PT RTRN 9FT ADLT (ELECTROSURGICAL) ×1 IMPLANT
EVACUATOR 1/8 PVC DRAIN (DRAIN) ×2 IMPLANT
GLOVE BIO SURGEON STRL SZ7.5 (GLOVE) ×2 IMPLANT
GLOVE BIO SURGEON STRL SZ8 (GLOVE) ×4 IMPLANT
GLOVE BIOGEL PI IND STRL 8 (GLOVE) ×2 IMPLANT
GLOVE BIOGEL PI INDICATOR 8 (GLOVE) ×2
GOWN STRL REUS W/TWL LRG LVL3 (GOWN DISPOSABLE) ×2 IMPLANT
GOWN STRL REUS W/TWL XL LVL3 (GOWN DISPOSABLE) ×2 IMPLANT
PACK ANTERIOR HIP CUSTOM (KITS) ×2 IMPLANT
STRIP CLOSURE SKIN 1/2X4 (GAUZE/BANDAGES/DRESSINGS) ×2 IMPLANT
SUT ETHIBOND NAB CT1 #1 30IN (SUTURE) ×2 IMPLANT
SUT MNCRL AB 4-0 PS2 18 (SUTURE) ×2 IMPLANT
SUT VIC AB 2-0 CT1 27 (SUTURE) ×6
SUT VIC AB 2-0 CT1 TAPERPNT 27 (SUTURE) ×2 IMPLANT
SUT VLOC 180 0 24IN GS25 (SUTURE) ×2 IMPLANT
SYR 50ML LL SCALE MARK (SYRINGE) IMPLANT
TRAY FOLEY W/METER SILVER 14FR (SET/KITS/TRAYS/PACK) ×2 IMPLANT
YANKAUER SUCT BULB TIP 10FT TU (MISCELLANEOUS) ×2 IMPLANT

## 2016-01-07 NOTE — Anesthesia Postprocedure Evaluation (Signed)
Anesthesia Post Note  Patient: Carolyn Glass  Procedure(s) Performed: Procedure(s) (LRB): TOTAL HIP ARTHROPLASTY ANTERIOR APPROACH (Right)  Patient location during evaluation: PACU Anesthesia Type: Spinal Level of consciousness: oriented and awake and alert Pain management: pain level controlled Vital Signs Assessment: post-procedure vital signs reviewed and stable Respiratory status: spontaneous breathing, respiratory function stable and patient connected to nasal cannula oxygen Cardiovascular status: blood pressure returned to baseline and stable Postop Assessment: no headache and no backache Anesthetic complications: no    Last Vitals:  Filed Vitals:   01/07/16 1845 01/07/16 1900  BP: 142/84 120/39  Pulse: 86 90  Temp: 36.7 C   Resp: 16 14    Last Pain:  Filed Vitals:   01/07/16 1910  PainSc: 1                  Montez Hageman

## 2016-01-07 NOTE — Anesthesia Preprocedure Evaluation (Signed)
Anesthesia Evaluation  Patient identified by MRN, date of birth, ID band Patient awake    Reviewed: Allergy & Precautions, NPO status , Patient's Chart, lab work & pertinent test results  Airway Mallampati: II  TM Distance: >3 FB Neck ROM: Full    Dental no notable dental hx.    Pulmonary asthma ,    Pulmonary exam normal breath sounds clear to auscultation       Cardiovascular hypertension, Pt. on medications Normal cardiovascular exam Rhythm:Regular Rate:Normal     Neuro/Psych negative neurological ROS  negative psych ROS   GI/Hepatic negative GI ROS, Neg liver ROS,   Endo/Other  diabetes, Type 2, Oral Hypoglycemic Agents  Renal/GU negative Renal ROS  negative genitourinary   Musculoskeletal negative musculoskeletal ROS (+)   Abdominal   Peds negative pediatric ROS (+)  Hematology negative hematology ROS (+)   Anesthesia Other Findings   Reproductive/Obstetrics negative OB ROS                             Anesthesia Physical Anesthesia Plan  ASA: II  Anesthesia Plan: Spinal   Post-op Pain Management:    Induction:   Airway Management Planned: Simple Face Mask  Additional Equipment:   Intra-op Plan:   Post-operative Plan:   Informed Consent: I have reviewed the patients History and Physical, chart, labs and discussed the procedure including the risks, benefits and alternatives for the proposed anesthesia with the patient or authorized representative who has indicated his/her understanding and acceptance.   Dental advisory given  Plan Discussed with: CRNA  Anesthesia Plan Comments:         Anesthesia Quick Evaluation

## 2016-01-07 NOTE — Op Note (Signed)
OPERATIVE REPORT  PREOPERATIVE DIAGNOSIS: Osteoarthritis of the Right hip.   POSTOPERATIVE DIAGNOSIS: Osteoarthritis of the Right  hip.   PROCEDURE: Right total hip arthroplasty, anterior approach.   SURGEON: Gaynelle Arabian, MD   ASSISTANT: Laroy Apple, PA-S  ANESTHESIA:  Spinal  ESTIMATED BLOOD LOSS:-325 ml  DRAINS: Hemovac x1.   COMPLICATIONS: None   CONDITION: PACU - hemodynamically stable.   BRIEF CLINICAL NOTE: Carolyn Glass is a 60 y.o. female who has advanced end-  stage arthritis of her Right  hip with progressively worsening pain and  dysfunction.The patient has failed nonoperative management and presents for  total hip arthroplasty.   PROCEDURE IN DETAIL: After successful administration of spinal  anesthetic, the traction boots for the [facility] bed were placed on both  feet and the patient was placed onto the [facility] bed, boots placed into the leg  holders. The Right hip was then isolated from the perineum with plastic  drapes and prepped and draped in the usual sterile fashion. ASIS and  greater trochanter were marked and a oblique incision was made, starting  at about 1 cm lateral and 2 cm distal to the ASIS and coursing towards  the anterior cortex of the femur. The skin was cut with a 10 blade  through subcutaneous tissue to the level of the fascia overlying the  tensor fascia lata muscle. The fascia was then incised in line with the  incision at the junction of the anterior third and posterior 2/3rd. The  muscle was teased off the fascia and then the interval between the TFL  and the rectus was developed. The Hohmann retractor was then placed at  the top of the femoral neck over the capsule. The vessels overlying the  capsule were cauterized and the fat on top of the capsule was removed.  A Hohmann retractor was then placed anterior underneath the rectus  femoris to give exposure to the entire anterior capsule. A T-shaped  capsulotomy was performed.  The edges were tagged and the femoral head  was identified.       Osteophytes are removed off the superior acetabulum.  The femoral neck was then cut in situ with an oscillating saw. Traction  was then applied to the left lower extremity utilizing the [facility]  traction. The femoral head was then removed. Retractors were placed  around the acetabulum and then circumferential removal of the labrum was  performed. Osteophytes were also removed. Reaming starts at 47 mm to  medialize and  Increased in 2 mm increments to 51 mm. We reamed in  approximately 40 degrees of abduction, 20 degrees anteversion. A 52 mm  pinnacle acetabular shell was then impacted in anatomic position under  fluoroscopic guidance with excellent purchase. We did not need to place  any additional dome screws. A 32 mm neutral + 4 marathon liner was then  placed into the acetabular shell.       The femoral lift was then placed along the lateral aspect of the femur  just distal to the vastus ridge. The leg was  externally rotated and capsule  was stripped off the inferior aspect of the femoral neck down to the  level of the lesser trochanter, this was done with electrocautery. The femur was lifted after this was performed. The  leg was then placed and extended in adducted position to essentially delivering the femur. We also removed the capsule superiorly and the  piriformis from the piriformis fossa  to gain excellent exposure of the  proximal femur. Rongeur was used to remove some cancellous bone to get  into the lateral portion of the proximal femur for placement of the  initial starter reamer. The starter broaches was placed  the starter broach  and was shown to go down the center of the canal. Broaching  with the  Corail system was then performed starting at size 8, coursing  Up to size 11. A size 11 had excellent torsional and rotational  and axial stability. The trial high offset neck was then placed  with a 32 + 1 trial  head. The hip was then reduced. We confirmed that  the stem was in the canal both on AP and lateral x-rays. It also has excellent sizing. The hip was reduced with outstanding stability through full extension, full external rotation,  and then flexion in adduction internal rotation. AP pelvis was taken  and the leg lengths were measured and found to be exactly equal. Hip  was then dislocated again and the femoral head and neck removed. The  femoral broach was removed. Size 11 Corail stem with a high offset  neck was then impacted into the femur following native anteversion. Has  excellent purchase in the canal. Excellent torsional and rotational and  axial stability. It is confirmed to be in the canal on AP and lateral  fluoroscopic views. The 32 + 1 ceramic head was placed and the hip  reduced with outstanding stability. Again AP pelvis was taken and it  confirmed that the leg lengths were equal. The wound was then copiously  irrigated with saline solution and the capsule reattached and repaired  with Ethibond suture. 20 ml of .25% Bupivicaine injected into the capsule and into the edge of the tensor fascia lata as well as subcutaneous tissue. The fascia overlying the tensor fascia lata was  then closed with a running #1 V-Loc. Subcu was closed with interrupted  2-0 Vicryl and subcuticular running 4-0 Monocryl. Incision was cleaned  and dried. Steri-Strips and a bulky sterile dressing applied. Hemovac  drain was hooked to suction and then he was awakened and transported to  recovery in stable condition.        Please note that a surgical assistant was a medical necessity for this procedure to perform it in a safe and expeditious manner. Assistant was necessary to provide appropriate retraction of vital neurovascular structures and to prevent femoral fracture and allow for anatomic placement of the prosthesis.  Gaynelle Arabian, M.D.

## 2016-01-07 NOTE — Transfer of Care (Signed)
Immediate Anesthesia Transfer of Care Note  Patient: Carolyn Glass  Procedure(s) Performed: Procedure(s): TOTAL HIP ARTHROPLASTY ANTERIOR APPROACH (Right)  Patient Location: PACU  Anesthesia Type:Spinal  Level of Consciousness: awake, alert , oriented and patient cooperative  Airway & Oxygen Therapy: Patient Spontanous Breathing and Patient connected to face mask oxygen  Post-op Assessment: Report given to RN, Post -op Vital signs reviewed and stable and Patient moving all extremities X 4  Post vital signs: stable  Last Vitals:  Filed Vitals:   01/07/16 1327  BP: 154/85  Pulse: 99  Temp: 36.8 C  Resp: 16    Last Pain:  Filed Vitals:   01/07/16 1349  PainSc: 1       Patients Stated Pain Goal: 4 ([id number])  Complications: No apparent anesthesia complications

## 2016-01-07 NOTE — H&P (Signed)
Carolyn Glass  DOB: [birth year] Married / Language: English / Race: White Female  Date of Admission:  01/07/2016 CC:  Right Hip Pain  History of Present Illness The patient is a 60 year old female who comes in for a preoperative History and Physical. The patient is scheduled for a right total hip arthroplasty (anterior) to be performed by Dr. Dione Plover. Aluisio, MD at [facility] on 01-07-2016. The patient is a 60 year old female who presented for follow up of their hip. The patient is being followed for their bilateral hip pain. Symptoms reported include: pain. The following medication has been used for pain control: Tylenol and Naproxen (but tries not to take it). The patient presents today following I-A injection on both hips (The left side was injected five weeks ago, and the right side was injected 37months ago.). Note for "Follow-up Hip": Patient states that her hips have noticed some improvement, but not much. Unfortunately, the hips are getting progressively worse over time. She has had pain for a few years, but it has gotten much worse especially on the right side. She is having a lot more functional limitations also. She is at a stage where the hip has essentially taken over her life. She is ready to proceed with the right total hip. They have been treated conservatively in the past for the above stated problem and despite conservative measures, they continue to have progressive pain and severe functional limitations and dysfunction. They have failed non-operative management including home exercise, medications, and injections. It is felt that they would benefit from undergoing total joint replacement. Risks and benefits of the procedure have been discussed with the patient and they elect to proceed with surgery. There are no active contraindications to surgery such as ongoing infection or rapidly progressive neurological disease.    Problem List/Past Medical  Primary osteoarthritis of  left hip (M16.12)  Osteoarthritis of right hip (M16.11)   Allergies  Azithromycin *MACROLIDES*  Rash.  Family History  Cancer  grandmother fathers side Cerebrovascular Accident  father Diabetes Mellitus  mother Hypertension  mother and father Osteoarthritis  mother Severe allergy  mother  Social History Alcohol use  never consumed alcohol Children  1 Current work status  retired Engineer, agricultural (Currently)  no Drug/Alcohol Rehab (Previously)  no Exercise  Exercises rarely; does running / walking Illicit drug use  no Living situation  live with spouse Marital status  married Number of flights of stairs before winded  4-5 Pain Contract  no Tobacco / smoke exposure  yes Tobacco use  never smoker  Medication History Omeprazole (40MG  Capsule DR, Oral) Active. Escitalopram Oxalate (20MG  Tablet, Oral) Active. Benazepril HCl (20MG  Tablet, Oral) Active. Calcium (Oral) Specific strength unknown - Active. Allegra Allergy (Oral) Specific strength unknown - Active. MetFORMIN HCl (500MG  Tablet, Oral) Active.    Past Surgical History Breast Biopsy  right, multiple times Dilation and Curettage of Uterus   Other Problems Asthma  Bronchitis  Anxiety Disorder  Diabetes Mellitus, Type II  Gastroesophageal Reflux Disease  High blood pressure  Osteoarthritis  Menopause     Review of Systems General Not Present- Chills, Fatigue, Fever, Memory Loss, Night Sweats, Weight Gain and Weight Loss. Skin Not Present- Eczema, Hives, Itching, Lesions and Rash. HEENT Not Present- Dentures, Double Vision, Headache, Hearing Loss, Tinnitus and Visual Loss. Respiratory Present- Allergies and Shortness of breath with exertion (History of Asthma). Not Present- Chronic Cough, Coughing up blood and Shortness of breath at rest. Cardiovascular Not Present-  Chest Pain, Difficulty Breathing Lying Down, Murmur, Palpitations, Racing/skipping heartbeats and  Swelling. Gastrointestinal Present- Heartburn (attributes to the NSAID's). Not Present- Abdominal Pain, Bloody Stool, Constipation, Diarrhea, Difficulty Swallowing, Jaundice, Loss of appetitie, Nausea and Vomiting. Female Genitourinary Not Present- Blood in Urine, Discharge, Flank Pain, Incontinence, Painful Urination, Urgency, Urinary frequency, Urinary Retention, Urinating at Night and Weak urinary stream. Musculoskeletal Present- Joint Pain and Morning Stiffness. Not Present- Back Pain, Joint Swelling, Muscle Pain, Muscle Weakness and Spasms. Neurological Not Present- Blackout spells, Difficulty with balance, Dizziness, Paralysis, Tremor and Weakness. Psychiatric Not Present- Insomnia.  Vitals Weight: 173 lb Height: 63in Weight was reported by patient. Height was reported by patient. Body Surface Area: 1.82 m Body Mass Index: 30.65 kg/m  Pulse: 84 (Regular)  BP: 134/74 (Sitting, Right Arm, Standard)  Physical Exam General Mental Status -Alert, cooperative and good historian. General Appearance-pleasant, Not in acute distress. Orientation-Oriented X3. Build & Nutrition-Well nourished and Well developed.  Head and Neck Head-normocephalic, atraumatic . Neck Global Assessment - supple, no bruit auscultated on the right, no bruit auscultated on the left.  Eye Vision-Wears corrective lenses(readers). Pupil - Bilateral-Regular and Round. Motion - Bilateral-EOMI.  Chest and Lung Exam Auscultation Breath sounds - clear at anterior chest wall and clear at posterior chest wall. Adventitious sounds - No Adventitious sounds.  Cardiovascular Auscultation Rhythm - Regular rate and rhythm. Heart Sounds - S1 WNL and S2 WNL. Murmurs & Other Heart Sounds - Auscultation of the heart reveals - No Murmurs.  Abdomen Palpation/Percussion Tenderness - Abdomen is non-tender to palpation. Rigidity (guarding) - Abdomen is soft. Auscultation Auscultation of the abdomen  reveals - Bowel sounds normal.  Female Genitourinary Note: Not done, not pertinent to present illness   Musculoskeletal Note: She is alert and oriented, in no apparent distress. Right hip can be flexed to about 100, no internal rotation, about 20 degrees of external rotation, 20 degrees of abduction. Left hip flexion about 110, rotation in 20, out 30, abduction 30 without discomfort. Gait pattern is significantly antalgic on the right.  RADIOGRAPHS AP pelvis and lateral of both hips show that she has advanced end-stage arthritis of the right hip bone-on-bone. She has a cystic mass in her femoral neck, which is well circumscribed and has a very benign appearance. She has significant arthritis in the left hip, but not as bad as the right.  Assessment & Plan  Osteoarthritis of right hip (M16.11)  Note:Surgical Plans: Right Total Hip Replacement - Anterior Approach  Disposition: Home  PCP: Dr. Sharilyn Sites  IV TXA  Anesthesia Issues: None  Signed electronically by Ok Edwards, III PA-C

## 2016-01-07 NOTE — Anesthesia Procedure Notes (Signed)
Spinal Patient location during procedure: OR End time: 01/07/2016 4:31 PM Staffing Performed by: anesthesiologist  Preanesthetic Checklist Completed: patient identified, site marked, surgical consent, pre-op evaluation, timeout performed, IV checked, risks and benefits discussed and monitors and equipment checked Spinal Block Patient position: sitting Prep: ChloraPrep Patient monitoring: heart rate, continuous pulse ox and blood pressure Approach: left paramedian Location: L3-4 Injection technique: single-shot Needle Needle type: Sprotte  Needle gauge: 24 G Needle length: 9 cm Assessment Sensory level: T6 Additional Notes Expiration date of kit checked and confirmed. Patient tolerated procedure well, without complications.

## 2016-01-08 ENCOUNTER — Encounter (HOSPITAL_COMMUNITY): Payer: Self-pay

## 2016-01-08 LAB — GLUCOSE, CAPILLARY
GLUCOSE-CAPILLARY: 113 mg/dL — AB (ref 65–99)
Glucose-Capillary: 152 mg/dL — ABNORMAL HIGH (ref 65–99)
Glucose-Capillary: 167 mg/dL — ABNORMAL HIGH (ref 65–99)
Glucose-Capillary: 185 mg/dL — ABNORMAL HIGH (ref 65–99)
Glucose-Capillary: 173 mg/dL — ABNORMAL HIGH (ref 65–99)

## 2016-01-08 LAB — BASIC METABOLIC PANEL
ANION GAP: 7 (ref 5–15)
BUN: 9 mg/dL (ref 6–20)
CHLORIDE: 102 mmol/L (ref 101–111)
CO2: 27 mmol/L (ref 22–32)
Calcium: 8.8 mg/dL — ABNORMAL LOW (ref 8.9–10.3)
Creatinine, Ser: 0.47 mg/dL (ref 0.44–1.00)
GFR calc Af Amer: >60 mL/min (ref >60)
GFR calc non Af Amer: >60 mL/min (ref >60)
GLUCOSE: 180 mg/dL — AB (ref 65–99)
POTASSIUM: 4.3 mmol/L (ref 3.5–5.1)
Sodium: 136 mmol/L (ref 135–145)

## 2016-01-08 LAB — CBC
HEMATOCRIT: 36.1 % (ref 36.0–46.0)
Hemoglobin: 12.1 g/dL (ref 12.0–15.0)
MCH: 31.4 pg (ref 26.0–34.0)
MCHC: 33.5 g/dL (ref 30.0–36.0)
MCV: 93.8 fL (ref 78.0–100.0)
Platelets: 264 10*3/uL (ref 150–400)
RBC: 3.85 MIL/uL — AB (ref 3.87–5.11)
RDW: 13.2 % (ref 11.5–15.5)
WBC: 12.8 10*3/uL — ABNORMAL HIGH (ref 4.0–10.5)

## 2016-01-08 MED ORDER — HYDROMORPHONE HCL 2 MG PO TABS: 2.0000 mg | ORAL_TABLET | ORAL | Status: DC | PRN

## 2016-01-08 MED ORDER — NON FORMULARY: 40.0000 mg | Freq: Every day | Status: DC

## 2016-01-08 MED ORDER — OMEPRAZOLE 20 MG PO CPDR
40.0000 mg | DELAYED_RELEASE_CAPSULE | Freq: Every day | ORAL | Status: DC
  Administered 2016-01-08 – 2016-01-09 (×2): 40 mg via ORAL
  Filled 2016-01-08 (×2): qty 2

## 2016-01-08 MED ORDER — RIVAROXABAN 10 MG PO TABS: 10.0000 mg | ORAL_TABLET | Freq: Every day | ORAL | Status: DC

## 2016-01-08 MED ORDER — METHOCARBAMOL 500 MG PO TABS: 500.0000 mg | ORAL_TABLET | Freq: Four times a day (QID) | ORAL | Status: DC | PRN

## 2016-01-08 NOTE — Evaluation (Signed)
Occupational Therapy Evaluation Patient Details Name: Carolyn Glass MRN: [id number] DOB: Apr 08, 1956 Today's Date: 01/08/2016    History of Present Illness s/p R DA THA   Clinical Impression   This 60 year old female was admitted for the above surgery. She will benefit from continued OT in acute to increase safety and independence with adls. Goals in acute are for min guard level.      Follow Up Recommendations  Supervision/Assistance - 24 hour    Equipment Recommendations   (has toilet riser; likely no further needs)    Recommendations for Other Services       Precautions / Restrictions Precautions Precautions: Fall Restrictions Weight Bearing Restrictions: No Other Position/Activity Restrictions: WBAT      Mobility Bed Mobility Overal bed mobility: Needs Assistance Bed Mobility: Supine to Sit     Supine to sit: Min assist     General bed mobility comments: assist for RLE  Transfers Overall transfer level: Needs assistance Equipment used: Rolling walker (2 wheeled) Transfers: Sit to/from [facility] Sit to Stand: Min assist Stand pivot transfers: Min assist       General transfer comment: cues for UE/LE placement    Balance                                            ADL Overall ADL's : Needs assistance/impaired             Lower Body Bathing: Moderate assistance;Sit to/from stand       Lower Body Dressing: Maximal assistance;Sit to/from stand   Toilet Transfer: Minimal assistance;Stand-pivot;RW (to recliner)             General ADL Comments: Pt anxious about getting up:  this was her first time up.  Pt had catheter in place. Educated on AE; husband is available to assist her as needed     Vision     Perception     Praxis      Pertinent Vitals/Pain Pain Assessment: 0-10 Pain Score: 6  Pain Location: R hip Pain Descriptors / Indicators: Tightness Pain Intervention(s): Limited activity within  patient's tolerance;Monitored during session;Premedicated before session;Repositioned (declined further ice)     Hand Dominance     Extremity/Trunk Assessment Upper Extremity Assessment Upper Extremity Assessment: Overall WFL for tasks assessed (arms shaky)           Communication Communication Communication: No difficulties   Cognition Arousal/Alertness: Awake/alert Behavior During Therapy: WFL for tasks assessed/performed Overall Cognitive Status: Within Functional Limits for tasks assessed                     General Comments       Exercises       Shoulder Instructions      Home Living Family/patient expects to be discharged to:: Private residence Living Arrangements: Spouse/significant other Available Help at Discharge: Family               Bathroom Shower/Tub: Walk-in Psychologist, prison and probation services: Standard     Home Equipment: Toilet riser          Prior Functioning/Environment Level of Independence: Independent             OT Diagnosis: Acute pain   OT Problem List: Decreased strength;Decreased activity tolerance;Decreased knowledge of use of DME or AE;Pain   OT Treatment/Interventions: Self-care/ADL training;DME and/or AE  instruction;Patient/family education    OT Goals(Current goals can be found in the care plan section) Acute Rehab OT Goals Patient Stated Goal: get back to being independent OT Goal Formulation: With patient Time For Goal Achievement: 01/15/16 Potential to Achieve Goals: Good ADL Goals Pt Will Perform Grooming: with min guard assist;standing Pt Will Perform Lower Body Bathing: with min guard assist;with adaptive equipment;sit to/from stand Pt Will Perform Lower Body Dressing: with min guard assist;with adaptive equipment;sit to/from stand Pt Will Transfer to Toilet: with min guard assist;ambulating;bedside commode (toilet riser) Pt Will Perform Toileting - Clothing Manipulation and hygiene: with min guard assist;sit  to/from stand Pt Will Perform Tub/Shower Transfer: Shower transfer;with min guard assist;ambulating;shower seat  OT Frequency: Min 2X/week   Barriers to D/C:            Co-evaluation              End of Session    Activity Tolerance: Patient tolerated treatment well Patient left: in chair;with call bell/phone within reach (no chair alarm pad present)   Time: [id number] OT Time Calculation (min): 23 min Charges:  OT General Charges $OT Visit: 1 Procedure OT Evaluation $OT Eval Low Complexity: 1 Procedure G-Codes:    Printice Hellmer 01/10/16, 12:15 PM Lesle Chris, OTR/L [phone] 01/10/2016

## 2016-01-08 NOTE — Care Management Note (Signed)
Case Management Note  Patient Details  Name: Carolyn Glass MRN: [id number] Date of Birth: September 16, 1955  Subjective/Objective:                   TOTAL HIP ARTHROPLASTY ANTERIOR APPROACH (Right) Action/Plan: Discharge planning  Expected Discharge Date:                  Expected Discharge Plan:  [place]  In-House Referral:     Discharge planning Services  CM Consult  Post Acute Care Choice:  NA Choice offered to:     DME Arranged:  N/A DME Agency:  NA  HH Arranged:  PT [place] Agency:  [place]  Status of Service:  Completed, signed off  Medicare Important Message Given:    Date Medicare IM Given:    Medicare IM give by:    Date Additional Medicare IM Given:    Additional Medicare Important Message give by:     If discussed at [place] of Stay Meetings, dates discussed:    Additional Comments: CM met with pt in room to offer choice of home health agency.  Pt chooses [facility] to render HHPT.  Pt has all DME at home.  Referral called to [facility] rep, Santiago Glad.  No other CM needs were communicated. Dellie Catholic, RN 01/08/2016, 4:16 PM

## 2016-01-08 NOTE — Progress Notes (Signed)
   Subjective: 1 Day Post-Op Procedure(s) (LRB): TOTAL HIP ARTHROPLASTY ANTERIOR APPROACH (Right) Patient reports pain as mild.   Patient seen in rounds by Dr. Wynelle Link. Patient is well, but has had some minor complaints of pain in the hip, requiring pain medications We will start therapy today.  Plan is to go Home after hospital stay.  Objective: Vital signs in last 24 hours: Temp:  [97.9 F (36.6 C)-99 F (37.2 C)] 97.9 F (36.6 C) (05/04 [id number]) Pulse Rate:  [68-99] 75 (05/04 0632) Resp:  [11-16] 16 (05/04 0632) BP: (120-154)/(39-85) 139/76 mmHg (05/04 0632) SpO2:  [97 %-100 %] 98 % (05/04 [id number]) Weight:  [78.472 kg (173 lb)] 78.472 kg (173 lb) (05/03 1347)  Intake/Output from previous day:  Intake/Output Summary (Last 24 hours) at 01/08/16 0731 Last data filed at 01/08/16 [id number]  Gross per 24 hour  Intake   2820 ml  Output   2205 ml  Net    615 ml    Intake/Output this shift: UOP 450   Labs:  Recent Labs  01/08/16 0422  HGB 12.1    Recent Labs  01/08/16 0422  WBC 12.8*  RBC 3.85*  HCT 36.1  PLT 264    Recent Labs  01/08/16 0422  NA 136  K 4.3  CL 102  CO2 27  BUN 9  CREATININE 0.47  GLUCOSE 180*  CALCIUM 8.8*   No results for input(s): LABPT, INR in the last 72 hours.  EXAM General - Patient is Alert and Appropriate Extremity - Neurovascular intact Sensation intact distally Dressing - dressing C/D/I Motor Function - intact, moving foot and toes well on exam.  Hemovac pulled without difficulty.  Past Medical History  Diagnosis Date  . Hypertension   . Shortness of breath dyspnea     with exertion   . Asthma     allergy and exercise induced asthma   . Diabetes mellitus without complication ([place])   . GERD (gastroesophageal reflux disease)   . Arthritis     Assessment/Plan: 1 Day Post-Op Procedure(s) (LRB): TOTAL HIP ARTHROPLASTY ANTERIOR APPROACH (Right) Active Problems:   OA (osteoarthritis) of hip  Estimated body mass index is  30.65 kg/(m^2) as calculated from the following:   Height as of this encounter: 5\' 3"  (1.6 m).   Weight as of this encounter: 78.472 kg (173 lb). Advance diet Up with therapy Plan for discharge tomorrow Discharge home with home health  DVT Prophylaxis - Xarelto Weight Bearing As Tolerated right Leg Hemovac Pulled Begin Therapy Probably home tomorrow  Arlee Muslim, PA-C Orthopaedic Surgery 01/08/2016, 7:31 AM

## 2016-01-08 NOTE — Progress Notes (Signed)
Physical Therapy Treatment Patient Details Name: Carolyn Glass MRN: [id number] DOB: 07-Nov-1955 Today's Date: Jan 14, 2016    History of Present Illness s/p R DA THA    PT Comments    Pt continues motivated but progressing slowly with mobility this pm 2* fatigue and "sore all  Over"  Follow Up Recommendations  Home health PT     Equipment Recommendations  Rolling walker with 5" wheels    Recommendations for Other Services OT consult     Precautions / Restrictions Precautions Precautions: Fall Restrictions Weight Bearing Restrictions: No Other Position/Activity Restrictions: WBAT    Mobility  Bed Mobility Overal bed mobility: Needs Assistance Bed Mobility: Sit to Supine       Sit to supine: Min assist   General bed mobility comments: Increased time, cues for sequence and use of L LE to self assist  Transfers Overall transfer level: Needs assistance Equipment used: Rolling walker (2 wheeled) Transfers: Sit to/from Stand Sit to Stand: Min assist         General transfer comment: cues for UE/LE placement  Ambulation/Gait Ambulation/Gait assistance: Min assist Ambulation Distance (Feet): 62 Feet Assistive device: Rolling walker (2 wheeled) Gait Pattern/deviations: Step-to pattern;Step-through pattern;Decreased step length - right;Decreased step length - left;Shuffle;Trunk flexed Gait velocity: decr   General Gait Details: cues for sequence, posture and position from [employer]            Wheelchair Mobility    Modified Rankin (Stroke Patients Only)       Balance                                    Cognition Arousal/Alertness: Awake/alert Behavior During Therapy: WFL for tasks assessed/performed Overall Cognitive Status: Within Functional Limits for tasks assessed                      Exercises Total Joint Exercises Ankle Circles/Pumps: AAROM;Both;15 reps;Supine    General Comments        Pertinent Vitals/Pain  Pain Assessment: 0-10 Pain Score: 6  Pain Location: Hip pain but c/o feeling sore all over Pain Descriptors / Indicators: Aching;Sore Pain Intervention(s): Limited activity within patient's tolerance;Monitored during session;Premedicated before session;Ice applied    Home Living                      Prior Function            PT Goals (current goals can now be found in the care plan section) Acute Rehab PT Goals Patient Stated Goal: get back to being independent PT Goal Formulation: With patient Potential to Achieve Goals: Good Progress towards PT goals: Progressing toward goals    Frequency  7X/week    PT Plan Current plan remains appropriate    Co-evaluation             End of Session Equipment Utilized During Treatment: Gait belt Activity Tolerance: Patient tolerated treatment well;Patient limited by fatigue Patient left: in bed;with call bell/phone within reach     Time: 1500-1524 PT Time Calculation (min) (ACUTE ONLY): 24 min  Charges:  $Gait Training: 8-22 mins $Therapeutic Activity: 8-22 mins                    G Codes:      Karmah Potocki Jan 14, 2016, 4:43 PM

## 2016-01-08 NOTE — Discharge Instructions (Addendum)
° °Dr. Frank Aluisio °Total Joint Specialist °[facility] °[address] °[place], [place] [address] °[phone] ° °ANTERIOR APPROACH TOTAL HIP REPLACEMENT POSTOPERATIVE DIRECTIONS ° ° °Hip Rehabilitation, Guidelines Following Surgery  °The results of a hip operation are greatly improved after range of motion and muscle strengthening exercises. Follow all safety measures which are given to protect your hip. If any of these exercises cause increased pain or swelling in your joint, decrease the amount until you are comfortable again. Then slowly increase the exercises. Call your caregiver if you have problems or questions.  ° °HOME CARE INSTRUCTIONS  °Remove items at home which could result in a fall. This includes throw rugs or furniture in walking pathways.  °· ICE to the affected hip every three hours for 30 minutes at a time and then as needed for pain and swelling.  Continue to use ice on the hip for pain and swelling from surgery. You may notice swelling that will progress down to the foot and ankle.  This is normal after surgery.  Elevate the leg when you are not up walking on it.   °· Continue to use the breathing machine which will help keep your temperature down.  It is common for your temperature to cycle up and down following surgery, especially at night when you are not up moving around and exerting yourself.  The breathing machine keeps your lungs expanded and your temperature down. ° ° °DIET °You may resume your previous home diet once your are discharged from the hospital. ° °DRESSING / WOUND CARE / SHOWERING °You may shower 3 days after surgery, but keep the wounds dry during showering.  You may use an occlusive plastic wrap (Press'n Seal for example), NO SOAKING/SUBMERGING IN THE BATHTUB.  If the bandage gets wet, change with a clean dry gauze.  If the incision gets wet, pat the wound dry with a clean towel. °You may start showering once you are discharged home but do not  submerge the incision under water. Just pat the incision dry and apply a dry gauze dressing on daily. °Change the surgical dressing daily and reapply a dry dressing each time. ° °ACTIVITY °Walk with your walker as instructed. °Use walker as long as suggested by your caregivers. °Avoid periods of inactivity such as sitting longer than an hour when not asleep. This helps prevent blood clots.  °You may resume a sexual relationship in one month or when given the OK by your doctor.  °You may return to work once you are cleared by your doctor.  °Do not drive a car for 6 weeks or until released by you surgeon.  °Do not drive while taking narcotics. ° °WEIGHT BEARING °Weight bearing as tolerated with assist device (walker, cane, etc) as directed, use it as long as suggested by your surgeon or therapist, typically at least 4-6 weeks. ° °POSTOPERATIVE CONSTIPATION PROTOCOL °Constipation - defined medically as fewer than three stools per week and severe constipation as less than one stool per week. ° °One of the most common issues patients have following surgery is constipation.  Even if you have a regular bowel pattern at home, your normal regimen is likely to be disrupted due to multiple reasons following surgery.  Combination of anesthesia, postoperative narcotics, change in appetite and fluid intake all can affect your bowels.  In order to avoid complications following surgery, here are some recommendations in order to help you during your recovery period. ° °Colace (docusate) - Pick up an over-the-counter   form of Colace or another stool softener and take twice a day as long as you are requiring postoperative pain medications.  Take with a full glass of water daily.  If you experience loose stools or diarrhea, hold the colace until you stool forms back up.  If your symptoms do not get better within 1 week or if they get worse, check with your doctor. ° °Dulcolax (bisacodyl) - Pick up over-the-counter and take as directed  by the product packaging as needed to assist with the movement of your bowels.  Take with a full glass of water.  Use this product as needed if not relieved by Colace only.  ° °MiraLax (polyethylene glycol) - Pick up over-the-counter to have on hand.  MiraLax is a solution that will increase the amount of water in your bowels to assist with bowel movements.  Take as directed and can mix with a glass of water, juice, soda, coffee, or tea.  Take if you go more than two days without a movement. °Do not use MiraLax more than once per day. Call your doctor if you are still constipated or irregular after using this medication for 7 days in a row. ° °If you continue to have problems with postoperative constipation, please contact the office for further assistance and recommendations.  If you experience "the worst abdominal pain ever" or develop nausea or vomiting, please contact the office immediatly for further recommendations for treatment. ° °ITCHING ° If you experience itching with your medications, try taking only a single pain pill, or even half a pain pill at a time.  You can also use Benadryl over the counter for itching or also to help with sleep.  ° °TED HOSE STOCKINGS °Wear the elastic stockings on both legs for three weeks following surgery during the day but you may remove then at night for sleeping. ° °MEDICATIONS °See your medication summary on the “After Visit Summary” that the nursing staff will review with you prior to discharge.  You may have some home medications which will be placed on hold until you complete the course of blood thinner medication.  It is important for you to complete the blood thinner medication as prescribed by your surgeon.  Continue your approved medications as instructed at time of discharge. ° °PRECAUTIONS °If you experience chest pain or shortness of breath - call 911 immediately for transfer to the hospital emergency department.  °If you develop a fever greater that 101 F,  purulent drainage from wound, increased redness or drainage from wound, foul odor from the wound/dressing, or calf pain - CONTACT YOUR SURGEON.   °                                                °FOLLOW-UP APPOINTMENTS °Make sure you keep all of your appointments after your operation with your surgeon and caregivers. You should call the office at the above phone number and make an appointment for approximately two weeks after the date of your surgery or on the date instructed by your surgeon outlined in the "After Visit Summary". ° °RANGE OF MOTION AND STRENGTHENING EXERCISES  °These exercises are designed to help you keep full movement of your hip joint. Follow your caregiver's or physical therapist's instructions. Perform all exercises about fifteen times, three times per day or as directed. Exercise both hips, even if you   have had only one joint replacement. These exercises can be done on a training (exercise) mat, on the floor, on a table or on a bed. Use whatever works the best and is most comfortable for you. Use music or television while you are exercising so that the exercises are a pleasant break in your day. This will make your life better with the exercises acting as a break in routine you can look forward to.  °Lying on your back, slowly slide your foot toward your buttocks, raising your knee up off the floor. Then slowly slide your foot back down until your leg is straight again.  °Lying on your back spread your legs as far apart as you can without causing discomfort.  °Lying on your side, raise your upper leg and foot straight up from the floor as far as is comfortable. Slowly lower the leg and repeat.  °Lying on your back, tighten up the muscle in the front of your thigh (quadriceps muscles). You can do this by keeping your leg straight and trying to raise your heel off the floor. This helps strengthen the largest muscle supporting your knee.  °Lying on your back, tighten up the muscles of your  buttocks both with the legs straight and with the knee bent at a comfortable angle while keeping your heel on the floor.  ° °IF YOU ARE TRANSFERRED TO A SKILLED REHAB FACILITY °If the patient is transferred to a skilled rehab facility following release from the hospital, a list of the current medications will be sent to the facility for the patient to continue.  When discharged from the skilled rehab facility, please have the facility set up the patient's Home Health Physical Therapy prior to being released. Also, the skilled facility will be responsible for providing the patient with their medications at time of release from the facility to include their pain medication, the muscle relaxants, and their blood thinner medication. If the patient is still at the rehab facility at time of the two week follow up appointment, the skilled rehab facility will also need to assist the patient in arranging follow up appointment in our office and any transportation needs. ° °MAKE SURE YOU:  °Understand these instructions.  °Get help right away if you are not doing well or get worse.  ° ° °Pick up stool softner and laxative for home use following surgery while on pain medications. °Do not submerge incision under water. °Please use good hand washing techniques while changing dressing each day. °May shower starting three days after surgery. °Please use a clean towel to pat the incision dry following showers. °Continue to use ice for pain and swelling after surgery. °Do not use any lotions or creams on the incision until instructed by your surgeon. ° °Take Xarelto for two and a half more weeks, then discontinue Xarelto. °Once the patient has completed the blood thinner regimen, then take a Baby 81 mg Aspirin daily for three more weeks. ° °Information on my medicine - XARELTO® (Rivaroxaban) ° °This medication education was reviewed with me or my healthcare representative as part of my discharge preparation.  The pharmacist that  spoke with me during my hospital stay was:  Zeigler, Dustin George, RPH ° °Why was Xarelto® prescribed for you? °Xarelto® was prescribed for you to reduce the risk of blood clots forming after orthopedic surgery. The medical term for these abnormal blood clots is venous thromboembolism (VTE). ° °What do you need to know about xarelto® ? °Take your Xarelto® ONCE   DAILY at the same time every day. °You may take it either with or without food. ° °If you have difficulty swallowing the tablet whole, you may crush it and mix in applesauce just prior to taking your dose. ° °Take Xarelto® exactly as prescribed by your doctor and DO NOT stop taking Xarelto® without talking to the doctor who prescribed the medication.  Stopping without other VTE prevention medication to take the place of Xarelto® may increase your risk of developing a clot. ° °After discharge, you should have regular check-up appointments with your healthcare provider that is prescribing your Xarelto®.   ° °What do you do if you miss a dose? °If you miss a dose, take it as soon as you remember on the same day then continue your regularly scheduled once daily regimen the next day. Do not take two doses of Xarelto® on the same day.  ° °Important Safety Information °A possible side effect of Xarelto® is bleeding. You should call your healthcare provider right away if you experience any of the following: °? Bleeding from an injury or your nose that does not stop. °? Unusual colored urine (red or dark brown) or unusual colored stools (red or black). °? Unusual bruising for unknown reasons. °? A serious fall or if you hit your head (even if there is no bleeding). ° °Some medicines may interact with Xarelto® and might increase your risk of bleeding while on Xarelto®. To help avoid this, consult your healthcare provider or pharmacist prior to using any new prescription or non-prescription medications, including herbals, vitamins, non-steroidal anti-inflammatory drugs  (NSAIDs) and supplements. ° °This website has more information on Xarelto®: [email or web address]. ° ° ° ° °

## 2016-01-08 NOTE — Discharge Summary (Signed)
Physician Discharge Summary   Patient ID: Carolyn Glass MRN: [id number] DOB/AGE: 12-Aug-1956 60 y.o.  Admit date: 01/07/2016 Discharge date: 01-09-16  Primary Diagnosis:  Osteoarthritis of the Right hip.   Admission Diagnoses:  Past Medical History  Diagnosis Date  . Hypertension   . Shortness of breath dyspnea     with exertion   . Asthma     allergy and exercise induced asthma   . Diabetes mellitus without complication ([place])   . GERD (gastroesophageal reflux disease)   . Arthritis    Discharge Diagnoses:   Active Problems:   OA (osteoarthritis) of hip  Estimated body mass index is 30.65 kg/(m^2) as calculated from the following:   Height as of this encounter: _0  (1.6 m).   Weight as of this encounter: 78.472 kg (173 lb).  Procedure(s) (LRB): TOTAL HIP ARTHROPLASTY ANTERIOR APPROACH (Right)   Consults: None  HPI: Carolyn Glass is a 60 y.o. female who has advanced end-  stage arthritis of her Right hip with progressively worsening pain and  dysfunction.The patient has failed nonoperative management and presents for  total hip arthroplasty.   Laboratory Data: Admission on 01/07/2016  Component Date Value Ref Range Status  . Glucose-Capillary 01/07/2016 122* 65 - 99 mg/dL Final  . WBC 01/08/2016 12.8* 4.0 - 10.5 K/uL Final  . RBC 01/08/2016 3.85* 3.87 - 5.11 MIL/uL Final  . Hemoglobin 01/08/2016 12.1  12.0 - 15.0 g/dL Final  . HCT 01/08/2016 36.1  36.0 - 46.0 % Final  . MCV 01/08/2016 93.8  78.0 - 100.0 fL Final  . MCH 01/08/2016 31.4  26.0 - 34.0 pg Final  . MCHC 01/08/2016 33.5  30.0 - 36.0 g/dL Final  . RDW 01/08/2016 13.2  11.5 - 15.5 % Final  . Platelets 01/08/2016 264  150 - 400 K/uL Final  . Sodium 01/08/2016 136  135 - 145 mmol/L Final  . Potassium 01/08/2016 4.3  3.5 - 5.1 mmol/L Final  . Chloride 01/08/2016 102  101 - 111 mmol/L Final  . CO2 01/08/2016 27  22 - 32 mmol/L Final  . Glucose, Bld 01/08/2016 180* 65 - 99 mg/dL Final  . BUN 01/08/2016 9   6 - 20 mg/dL Final  . Creatinine, Ser 01/08/2016 0.47  0.44 - 1.00 mg/dL Final  . Calcium 01/08/2016 8.8* 8.9 - 10.3 mg/dL Final  . GFR calc non Af Amer 01/08/2016 >60  >60 mL/min Final  . GFR calc Af Amer 01/08/2016 >60  >60 mL/min Final   Comment: (NOTE) The eGFR has been calculated using the CKD EPI equation. This calculation has not been validated in all clinical situations. eGFR's persistently <60 mL/min signify possible Chronic Kidney Disease.   . Anion gap 01/08/2016 7  5 - 15 Final  . Glucose-Capillary 01/07/2016 113* 65 - 99 mg/dL Final  . Comment 1 01/07/2016 Notify RN   Final  . Glucose-Capillary 01/07/2016 167* 65 - 99 mg/dL Final  . Comment 1 01/07/2016 Notify RN   Final  . Glucose-Capillary 01/08/2016 152* 65 - 99 mg/dL Final  . Glucose-Capillary 01/08/2016 185* 65 - 99 mg/dL Final  Hospital Outpatient Visit on 12/29/2015  Component Date Value Ref Range Status  . aPTT 12/29/2015 33  24 - 37 seconds Final  . WBC 12/29/2015 9.4  4.0 - 10.5 K/uL Final  . RBC 12/29/2015 4.46  3.87 - 5.11 MIL/uL Final  . Hemoglobin 12/29/2015 14.0  12.0 - 15.0 g/dL Final  . HCT 12/29/2015 42.3  36.0 - 46.0 %  Final  . MCV 12/29/2015 94.8  78.0 - 100.0 fL Final  . MCH 12/29/2015 31.4  26.0 - 34.0 pg Final  . MCHC 12/29/2015 33.1  30.0 - 36.0 g/dL Final  . RDW 12/29/2015 13.6  11.5 - 15.5 % Final  . Platelets 12/29/2015 262  150 - 400 K/uL Final  . Sodium 12/29/2015 139  135 - 145 mmol/L Final  . Potassium 12/29/2015 4.3  3.5 - 5.1 mmol/L Final  . Chloride 12/29/2015 105  101 - 111 mmol/L Final  . CO2 12/29/2015 25  22 - 32 mmol/L Final  . Glucose, Bld 12/29/2015 115* 65 - 99 mg/dL Final  . BUN 12/29/2015 10  6 - 20 mg/dL Final  . Creatinine, Ser 12/29/2015 0.54  0.44 - 1.00 mg/dL Final  . Calcium 12/29/2015 9.4  8.9 - 10.3 mg/dL Final  . Total Protein 12/29/2015 7.6  6.5 - 8.1 g/dL Final  . Albumin 12/29/2015 4.2  3.5 - 5.0 g/dL Final  . AST 12/29/2015 21  15 - 41 U/L Final  . ALT  12/29/2015 20  14 - 54 U/L Final  . Alkaline Phosphatase 12/29/2015 101  38 - 126 U/L Final  . Total Bilirubin 12/29/2015 0.7  0.3 - 1.2 mg/dL Final  . GFR calc non Af Amer 12/29/2015 >60  >60 mL/min Final  . GFR calc Af Amer 12/29/2015 >60  >60 mL/min Final   Comment: (NOTE) The eGFR has been calculated using the CKD EPI equation. This calculation has not been validated in all clinical situations. eGFR's persistently <60 mL/min signify possible Chronic Kidney Disease.   . Anion gap 12/29/2015 9  5 - 15 Final  . Prothrombin Time 12/29/2015 13.3  11.6 - 15.2 seconds Final  . INR 12/29/2015 1.03  0.00 - 1.49 Final  . ABO/RH(D) 12/29/2015 O POS   Final  . Antibody Screen 12/29/2015 NEG   Final  . Sample Expiration 12/29/2015 01/10/2016   Final  . Extend sample reason 12/29/2015 NO TRANSFUSIONS OR PREGNANCY IN THE PAST 3 MONTHS   Final  . Color, Urine 12/29/2015 YELLOW  YELLOW Final  . APPearance 12/29/2015 CLEAR  CLEAR Final  . Specific Gravity, Urine 12/29/2015 1.012  1.005 - 1.030 Final  . pH 12/29/2015 6.0  5.0 - 8.0 Final  . Glucose, UA 12/29/2015 100* NEGATIVE mg/dL Final  . Hgb urine dipstick 12/29/2015 LARGE* NEGATIVE Final  . Bilirubin Urine 12/29/2015 NEGATIVE  NEGATIVE Final  . Ketones, ur 12/29/2015 NEGATIVE  NEGATIVE mg/dL Final  . Protein, ur 12/29/2015 NEGATIVE  NEGATIVE mg/dL Final  . Nitrite 12/29/2015 NEGATIVE  NEGATIVE Final  . Leukocytes, UA 12/29/2015 MODERATE* NEGATIVE Final  . MRSA, PCR 12/29/2015 NEGATIVE  NEGATIVE Final  . Staphylococcus aureus 12/29/2015 NEGATIVE  NEGATIVE Final   Comment:        The Xpert SA Assay (FDA approved for NASAL specimens in patients over 69 years of age), is one component of a comprehensive surveillance program.  Test performance has been validated by [facility] for patients greater than or equal to 67 year old. It is not intended to diagnose infection nor to guide or monitor treatment.   . ABO/RH(D) 12/29/2015 O POS    Final  . Squamous Epithelial / LPF 12/29/2015 NONE SEEN  NONE SEEN Final  . WBC, UA 12/29/2015 6-30  0 - 5 WBC/hpf Final  . RBC / HPF 12/29/2015 6-30  0 - 5 RBC/hpf Final  . Bacteria, UA 12/29/2015 RARE* NONE SEEN Final  X-Rays:Dg Pelvis Portable  01/07/2016  CLINICAL DATA:  Status post right hip replacement. EXAM: PORTABLE PELVIS 1-2 VIEWS; DG C-ARM 1-60 MIN-NO REPORT COMPARISON:  None. FINDINGS: Single AP view shows right hip replacement hardware in place. Hardware appears grossly intact and appropriately positioned. Expected postsurgical changes within the surrounding soft tissues. IMPRESSION: Postsurgical changes of right hip arthroplasty. Hardware appears intact and appropriately positioned. No evidence of surgical complicating feature. Electronically Signed   By: Franki Cabot M.D.   On: 01/07/2016 19:37   Dg C-arm 1-60 Min-no Report  01/07/2016  CLINICAL DATA: SURG C-ARM 1-60 MINUTES Fluoroscopy was utilized by the requesting physician.  No radiographic interpretation.    EKG: Orders placed or performed during the hospital encounter of 12/29/15  . EKG 12 lead  . EKG 12 lead     Hospital Course: Patient was admitted to [facility] and taken to the OR and underwent the above state procedure without complications.  Patient tolerated the procedure well and was later transferred to the recovery room and then to the orthopaedic floor for postoperative care.  They were given PO and IV analgesics for pain control following their surgery.  They were given 24 hours of postoperative antibiotics of  Anti-infectives    Start     Dose/Rate Route Frequency Ordered Stop   01/07/16 2300  ceFAZolin (ANCEF) IVPB 2g/100 mL premix     2 g 200 mL/hr over 30 Minutes Intravenous Every 6 hours 01/07/16 1955 01/08/16 0602   01/07/16 1330  ceFAZolin (ANCEF) IVPB 2g/100 mL premix     2 g 200 mL/hr over 30 Minutes Intravenous On call to O.R. 01/07/16 1320 01/07/16 1627     and started on DVT  prophylaxis in the form of Xarelto.   PT and OT were ordered for total hip protocol.  The patient was allowed to be WBAT with therapy. Discharge planning was consulted to help with postop disposition and equipment needs.  Patient had a decent night on the evening of surgery.  They started to get up OOB with therapy on day one.  Hemovac drain was pulled without difficulty.  Continued to work with therapy into day two.  Dressing was changed on day two and the incision was healing well.  Patient was seen in rounds and was ready to go home on day two.  Discharge home with home health Diet - Cardiac diet and Diabetic Diet Follow up - in 2 weeks Activity - WBAT Disposition - Home Condition Upon Discharge - Stable D/C Meds - See DC Summary DVT Prophylaxis - Xarelto  Discharge Instructions    Call MD / Call 911    Complete by:  As directed   If you experience chest pain or shortness of breath, CALL 911 and be transported to the hospital emergency room.  If you develope a fever above 101 F, pus (white drainage) or increased drainage or redness at the wound, or calf pain, call your surgeon's office.     Change dressing    Complete by:  As directed   You may change your dressing dressing daily with sterile 4 x 4 inch gauze dressing and paper tape.  Do not submerge the incision under water.     Constipation Prevention    Complete by:  As directed   Drink plenty of fluids.  Prune juice may be helpful.  You may use a stool softener, such as Colace (over the counter) 100 mg twice a day.  Use MiraLax (over the counter)  for constipation as needed.     Diet - low sodium heart healthy    Complete by:  As directed      Diet Carb Modified    Complete by:  As directed      Discharge instructions    Complete by:  As directed   Pick up stool softner and laxative for home use following surgery while on pain medications. Do not submerge incision under water. Please use good hand washing techniques while changing  dressing each day. May shower starting three days after surgery. Please use a clean towel to pat the incision dry following showers. Continue to use ice for pain and swelling after surgery. Do not use any lotions or creams on the incision until instructed by your surgeon.   Total Hip Protocol.  Take Xarelto for two and a half more weeks, then discontinue Xarelto. Once the patient has completed the blood thinner regimen, then take a Baby 81 mg Aspirin daily for three more weeks.  Postoperative Constipation Protocol  Constipation - defined medically as fewer than three stools per week and severe constipation as less than one stool per week.  One of the most common issues patients have following surgery is constipation.  Even if you have a regular bowel pattern at home, your normal regimen is likely to be disrupted due to multiple reasons following surgery.  Combination of anesthesia, postoperative narcotics, change in appetite and fluid intake all can affect your bowels.  In order to avoid complications following surgery, here are some recommendations in order to help you during your recovery period.  Colace (docusate) - Pick up an over-the-counter form of Colace or another stool softener and take twice a day as long as you are requiring postoperative pain medications.  Take with a full glass of water daily.  If you experience loose stools or diarrhea, hold the colace until you stool forms back up.  If your symptoms do not get better within 1 week or if they get worse, check with your doctor.  Dulcolax (bisacodyl) - Pick up over-the-counter and take as directed by the product packaging as needed to assist with the movement of your bowels.  Take with a full glass of water.  Use this product as needed if not relieved by Colace only.   MiraLax (polyethylene glycol) - Pick up over-the-counter to have on hand.  MiraLax is a solution that will increase the amount of water in your bowels to assist with  bowel movements.  Take as directed and can mix with a glass of water, juice, soda, coffee, or tea.  Take if you go more than two days without a movement. Do not use MiraLax more than once per day. Call your doctor if you are still constipated or irregular after using this medication for 7 days in a row.  If you continue to have problems with postoperative constipation, please contact the office for further assistance and recommendations.  If you experience "the worst abdominal pain ever" or develop nausea or vomiting, please contact the office immediatly for further recommendations for treatment.     Do not sit on low chairs, stoools or toilet seats, as it may be difficult to get up from low surfaces    Complete by:  As directed      Driving restrictions    Complete by:  As directed   No driving until released by the physician.     Increase activity slowly as tolerated    Complete by:  As directed      Lifting restrictions    Complete by:  As directed   No lifting until released by the physician.     Patient may shower    Complete by:  As directed   You may shower without a dressing once there is no drainage.  Do not wash over the wound.  If drainage remains, do not shower until drainage stops.     TED hose    Complete by:  As directed   Use stockings (TED hose) for 3 weeks on both leg(s).  You may remove them at night for sleeping.     Weight bearing as tolerated    Complete by:  As directed   Laterality:  right  Extremity:  Lower            Medication List    STOP taking these medications        calcium-vitamin D 500-200 MG-UNIT tablet  Commonly known as:  OSCAL WITH D     cyclobenzaprine 10 MG tablet  Commonly known as:  FLEXERIL     HYDROcodone-acetaminophen 5-325 MG tablet  Commonly known as:  NORCO/VICODIN      TAKE these medications        albuterol 108 (90 Base) MCG/ACT inhaler  Commonly known as:  PROVENTIL HFA;VENTOLIN HFA  Inhale 2 puffs into the lungs every  6 (six) hours as needed for wheezing or shortness of breath.     benazepril 20 MG tablet  Commonly known as:  LOTENSIN  Take 20 mg by mouth 2 (two) times daily.     escitalopram 20 MG tablet  Commonly known as:  LEXAPRO  Take 20 mg by mouth daily.     fexofenadine 180 MG tablet  Commonly known as:  ALLEGRA  Take 180 mg by mouth daily.     HYDROmorphone 2 MG tablet  Commonly known as:  DILAUDID  Take 1-2 tablets (2-4 mg total) by mouth every 4 (four) hours as needed for moderate pain or severe pain.     metFORMIN 500 MG tablet  Commonly known as:  GLUCOPHAGE  Take 500 mg by mouth 2 (two) times daily with a meal.     methocarbamol 500 MG tablet  Commonly known as:  ROBAXIN  Take 1 tablet (500 mg total) by mouth every 6 (six) hours as needed for muscle spasms.     omeprazole 40 MG capsule  Commonly known as:  PRILOSEC  Take 40 mg by mouth daily.     rivaroxaban 10 MG Tabs tablet  Commonly known as:  XARELTO  Take 1 tablet (10 mg total) by mouth daily with breakfast. Take Xarelto for two and a half more weeks, then discontinue Xarelto. Once the patient has completed the blood thinner regimen, then take a Baby 81 mg Aspirin daily for three more weeks.           Follow-up Information    Follow up with [facility].   Why:  home health physical therapy   Contact information:   [address] [place] [address] [phone]       Follow up with Gearlean Alf, MD. Schedule an appointment as soon as possible for a visit on 01/20/2016.   Specialty:  Orthopedic Surgery   Why:  Call office at [phone] to setup appointment on Tuesday 01/20/16 with Dr. Wynelle Link.   Contact information:   [address] [place] [address] [phone]  Signed: Arlee Muslim, PA-C Orthopaedic Surgery 01/08/2016, 9:59 PM

## 2016-01-08 NOTE — Evaluation (Signed)
Physical Therapy Evaluation Patient Details Name: Carolyn Glass MRN: [id number] DOB: 1956-02-15 Today's Date: 01/08/2016   History of Present Illness  s/p R DA THA  Clinical Impression  Pt s/p R THR presents with decreased R LE strength/ROM and post op pain limiting functional mobility.  Pt should progress to dc home with family assist and HHPT follow up.      Follow Up Recommendations Home health PT    Equipment Recommendations  None recommended by PT    Recommendations for Other Services OT consult     Precautions / Restrictions Precautions Precautions: Fall Restrictions Weight Bearing Restrictions: No Other Position/Activity Restrictions: WBAT      Mobility  Bed Mobility Overal bed mobility: Needs Assistance Bed Mobility: Supine to Sit     Supine to sit: Min assist     General bed mobility comments: OOB with OT - declines back to bed  Transfers Overall transfer level: Needs assistance Equipment used: Rolling walker (2 wheeled) Transfers: Sit to/from Stand Sit to Stand: Min assist Stand pivot transfers: Min assist       General transfer comment: cues for UE/LE placement  Ambulation/Gait Ambulation/Gait assistance: Min assist Ambulation Distance (Feet): 64 Feet Assistive device: Rolling walker (2 wheeled) Gait Pattern/deviations: Step-to pattern;Decreased step length - right;Decreased step length - left;Shuffle;Trunk flexed Gait velocity: decr   General Gait Details: cues for sequence, posture and position from [employer]            Wheelchair Mobility    Modified Rankin (Stroke Patients Only)       Balance                                             Pertinent Vitals/Pain Pain Assessment: Faces Pain Score: 6  Faces Pain Scale: Hurts little more Pain Location: R hip Pain Descriptors / Indicators: Aching;Sore;Grimacing Pain Intervention(s): Limited activity within patient's tolerance;Monitored during  session;Premedicated before session;Ice applied    Home Living Family/patient expects to be discharged to:: Private residence Living Arrangements: Spouse/significant other Available Help at Discharge: Family Type of Home: Apartment Home Access: Stairs to enter Entrance Stairs-Rails: None Technical brewer of Steps: 3 Home Layout: One level Home Equipment: Environmental consultant - 4 wheels Additional Comments: Pt states she can borrow 4 wh RW from father    Prior Function Level of Independence: Independent               Hand Dominance        Extremity/Trunk Assessment   Upper Extremity Assessment: Overall WFL for tasks assessed           Lower Extremity Assessment: RLE deficits/detail RLE Deficits / Details: Strengtha t hip 2+/5 with AAROM at hip to 80 flex and 15 abd    Cervical / Trunk Assessment: Normal  Communication   Communication: No difficulties  Cognition Arousal/Alertness: Awake/alert Behavior During Therapy: WFL for tasks assessed/performed Overall Cognitive Status: Within Functional Limits for tasks assessed                      General Comments      Exercises Total Joint Exercises Ankle Circles/Pumps: AAROM;Both;15 reps;Supine Quad Sets: AAROM;Both;10 reps;Supine Heel Slides: AAROM;Right;15 reps;Supine Hip ABduction/ADduction: AAROM;Right;10 reps;Supine      Assessment/Plan    PT Assessment Patient needs continued PT services  PT Diagnosis Difficulty walking   PT Problem List Decreased  strength;Decreased range of motion;Decreased activity tolerance;Decreased mobility;Decreased knowledge of use of DME;Pain  PT Treatment Interventions DME instruction;Gait training;Stair training;Functional mobility training;Therapeutic activities;Therapeutic exercise;Patient/family education   PT Goals (Current goals can be found in the Care Plan section) Acute Rehab PT Goals Patient Stated Goal: get back to being independent PT Goal Formulation: With  patient Potential to Achieve Goals: Good    Frequency 7X/week   Barriers to discharge        Co-evaluation               End of Session Equipment Utilized During Treatment: Gait belt Activity Tolerance: Patient tolerated treatment well Patient left: in chair;with call bell/phone within reach Nurse Communication: Mobility status         Time: [id number] PT Time Calculation (min) (ACUTE ONLY): 38 min   Charges:   PT Evaluation $PT Eval Low Complexity: 1 Procedure PT Treatments $Gait Training: 8-22 mins $Therapeutic Exercise: 8-22 mins   PT G Codes:        Abdulahad Mederos 29-Jan-2016, 12:47 PM

## 2016-01-09 LAB — BASIC METABOLIC PANEL
Anion gap: 8 (ref 5–15)
BUN: 18 mg/dL (ref 6–20)
CO2: 28 mmol/L (ref 22–32)
Calcium: 9.1 mg/dL (ref 8.9–10.3)
Chloride: 104 mmol/L (ref 101–111)
Creatinine, Ser: 0.65 mg/dL (ref 0.44–1.00)
GFR calc Af Amer: >60 mL/min (ref >60)
GLUCOSE: 169 mg/dL — AB (ref 65–99)
POTASSIUM: 3.8 mmol/L (ref 3.5–5.1)
Sodium: 140 mmol/L (ref 135–145)

## 2016-01-09 LAB — CBC
HCT: 36.3 % (ref 36.0–46.0)
Hemoglobin: 12.1 g/dL (ref 12.0–15.0)
MCH: 31.3 pg (ref 26.0–34.0)
MCHC: 33.3 g/dL (ref 30.0–36.0)
MCV: 93.8 fL (ref 78.0–100.0)
PLATELETS: 267 10*3/uL (ref 150–400)
RBC: 3.87 MIL/uL (ref 3.87–5.11)
RDW: 13.4 % (ref 11.5–15.5)
WBC: 17.3 10*3/uL — ABNORMAL HIGH (ref 4.0–10.5)

## 2016-01-09 LAB — GLUCOSE, CAPILLARY
GLUCOSE-CAPILLARY: 136 mg/dL — AB (ref 65–99)
Glucose-Capillary: 188 mg/dL — ABNORMAL HIGH (ref 65–99)
Glucose-Capillary: 137 mg/dL — ABNORMAL HIGH (ref 65–99)

## 2016-01-09 NOTE — Progress Notes (Signed)
[facility] delivered RW to pt's room. Pt declined 3n1 bedside commode. Jonnie Finner RN CCM Case Mgmt phone [phone]

## 2016-01-09 NOTE — Progress Notes (Signed)
   Subjective: 2 Days Post-Op Procedure(s) (LRB): TOTAL HIP ARTHROPLASTY ANTERIOR APPROACH (Right) Patient reports pain as mild.   Patient seen in rounds by Dr. Wynelle Link. Patient is well, but has had some minor complaints of pain in the hip and thigh, requiring pain medications Patient is ready to go home today  Objective: Vital signs in last 24 hours: Temp:  [98 F (36.7 C)-98.6 F (37 C)] 98.2 F (36.8 C) (05/05 0533) Pulse Rate:  [76-81] 76 (05/05 0533) Resp:  [16-18] 16 (05/05 0533) BP: (132-147)/(61-74) 145/73 mmHg (05/05 0533) SpO2:  [95 %-99 %] 97 % (05/05 0533)  Intake/Output from previous day:  Intake/Output Summary (Last 24 hours) at 01/09/16 0755 Last data filed at 01/09/16 0534  Gross per 24 hour  Intake   1560 ml  Output   2800 ml  Net  -1240 ml    Labs:  Recent Labs  01/08/16 0422 01/09/16 0358  HGB 12.1 12.1    Recent Labs  01/08/16 0422 01/09/16 0358  WBC 12.8* 17.3*  RBC 3.85* 3.87  HCT 36.1 36.3  PLT 264 267    Recent Labs  01/08/16 0422 01/09/16 0358  NA 136 140  K 4.3 3.8  CL 102 104  CO2 27 28  BUN 9 18  CREATININE 0.47 0.65  GLUCOSE 180* 169*  CALCIUM 8.8* 9.1   No results for input(s): LABPT, INR in the last 72 hours.  EXAM: General - Patient is Alert, Appropriate and Oriented Extremity - Neurovascular intact Sensation intact distally Dorsiflexion/Plantar flexion intact Incision - clean, dry, no drainage Motor Function - intact, moving foot and toes well on exam.   Assessment/Plan: 2 Days Post-Op Procedure(s) (LRB): TOTAL HIP ARTHROPLASTY ANTERIOR APPROACH (Right) Procedure(s) (LRB): TOTAL HIP ARTHROPLASTY ANTERIOR APPROACH (Right) Past Medical History  Diagnosis Date  . Hypertension   . Shortness of breath dyspnea     with exertion   . Asthma     allergy and exercise induced asthma   . Diabetes mellitus without complication ([place])   . GERD (gastroesophageal reflux disease)   . Arthritis    Active Problems:  OA (osteoarthritis) of hip  Estimated body mass index is 30.65 kg/(m^2) as calculated from the following:   Height as of this encounter: 5\' 3"  (1.6 m).   Weight as of this encounter: 78.472 kg (173 lb). Up with therapy Discharge home with home health Diet - Cardiac diet and Diabetic Diet Follow up - in 2 weeks Activity - WBAT Disposition - Home Condition Upon Discharge - Stable D/C Meds - See DC Summary DVT Prophylaxis - [place], PA-C Orthopaedic Surgery 01/09/2016, 7:55 AM

## 2016-01-09 NOTE — Progress Notes (Signed)
[facility]   [facility] is providing the following services:RW - patient declined commode  If patient discharges after hours, please call [phone].   Linward Headland 01/09/2016, 11:55 AM

## 2016-01-09 NOTE — Progress Notes (Signed)
Physical Therapy Treatment Patient Details Name: Carolyn Glass MRN: [id number] DOB: 1956-07-22 Today's Date: 01/09/2016    History of Present Illness s/p R DA THA    PT Comments    POD # 2 Assisted with amb a greater distance then practiced stairs with spouse.  Performed some THR TE's following HEP and applied ICE.  Pt ready for D/C to home.   Follow Up Recommendations  Home health PT     Equipment Recommendations  Rolling walker with 5" wheels    Recommendations for Other Services       Precautions / Restrictions Precautions Precautions: None Restrictions Weight Bearing Restrictions: No Other Position/Activity Restrictions: WBAT    Mobility  Bed Mobility               General bed mobility comments: Pt OOB in recliner  Transfers Overall transfer level: Needs assistance Equipment used: Rolling walker (2 wheeled) Transfers: Sit to/from Stand Sit to Stand: Supervision         General transfer comment: cues for UE/LE placement and increased time  Ambulation/Gait Ambulation/Gait assistance: Supervision Ambulation Distance (Feet): 72 Feet Assistive device: Rolling walker (2 wheeled) Gait Pattern/deviations: Step-to pattern     General Gait Details: cues for sequence, posture and position from RW   Stairs Stairs: Yes Stairs assistance: Min assist Stair Management: No rails;Step to pattern;Backwards;With walker Number of Stairs: 4 General stair comments: <25% VC's on proper walker placement and safety    spouse present  Wheelchair Mobility    Modified Rankin (Stroke Patients Only)       Balance                                    Cognition Arousal/Alertness: Awake/alert Behavior During Therapy: WFL for tasks assessed/performed Overall Cognitive Status: Within Functional Limits for tasks assessed                      Exercises      General Comments        Pertinent Vitals/Pain Pain Assessment: 0-10 Pain Score:  4  Pain Location: R hip Pain Descriptors / Indicators: Sore;Tender Pain Intervention(s): Monitored during session;Premedicated before session;Repositioned;Ice applied    Home Living                      Prior Function            PT Goals (current goals can now be found in the care plan section)      Frequency  7X/week    PT Plan Current plan remains appropriate    Co-evaluation             End of Session Equipment Utilized During Treatment: Gait belt Activity Tolerance: Patient tolerated treatment well;Patient limited by fatigue Patient left: in chair;with call bell/phone within reach;with family/visitor present     Time: 1025-1050 PT Time Calculation (min) (ACUTE ONLY): 25 min  Charges:  $Gait Training: 8-22 mins $Therapeutic Exercise: 8-22 mins                    G Codes:      Rica Koyanagi  PTA [facility]  Acute  Rehab Pager      [phone]

## 2016-01-09 NOTE — Progress Notes (Signed)
Occupational Therapy Treatment Patient Details Name: Carolyn Glass MRN: [id number] DOB: Aug 08, 1956 Today's Date: 01/09/2016    History of present illness s/p R DA THA   OT comments  All OT education completed; no further OT needs at this time  Follow Up Recommendations  Supervision/Assistance - 24 hour    Equipment Recommendations  None recommended by OT    Recommendations for Other Services      Precautions / Restrictions Precautions Precautions: Fall Restrictions Weight Bearing Restrictions: No (WBAT) Other Position/Activity Restrictions: WBAT       Mobility Bed Mobility           Sit to supine: Min assist   General bed mobility comments: attempted to use sheet to self-assist.  Still required min A  Transfers   Equipment used: Rolling walker (2 wheeled) Transfers: Sit to/from Stand Sit to Stand: Min guard         General transfer comment: cues for UE/LE placement    Balance                                   ADL Overall ADL's : Needs assistance/impaired     Grooming: Oral care;Standing;Supervision/safety                           Tub/ Shower Transfer: Min guard;Walk-in shower;Ambulation     General ADL Comments: pt did not want to practice with AE this session nor complete any more ADLs. She states family will assist her at home.        Vision                     Perception     Praxis      Cognition   Behavior During Therapy: WFL for tasks assessed/performed Overall Cognitive Status: Within Functional Limits for tasks assessed                       Extremity/Trunk Assessment               Exercises     Shoulder Instructions       General Comments      Pertinent Vitals/ Pain       Pain Score: 3  Pain Location: R hip Pain Descriptors / Indicators: Sore Pain Intervention(s): Limited activity within patient's tolerance;Monitored during session;Premedicated before  session;Repositioned  Home Living                                          Prior Functioning/Environment              Frequency       Progress Toward Goals  OT Goals(current goals can now be found in the care plan section)  Progress towards OT goals: Progressing toward goals (all goals not met but no further OT needs)     Plan      Co-evaluation                 End of Session     Activity Tolerance Patient tolerated treatment well;No increased pain   Patient Left in chair;with call bell/phone within reach   Nurse Communication          Time: 7106-2694 OT Time Calculation (min): 19 min  Charges: OT General Charges $OT Visit: 1 Procedure OT Treatments $Self Care/Home Management : 8-22 mins  Jaedon Siler 01/09/2016, 10:42 AM  Lesle Chris, OTR/L [phone] 01/09/2016

## 2016-02-18 ENCOUNTER — Other Ambulatory Visit (HOSPITAL_COMMUNITY): Payer: Self-pay | Admitting: Obstetrics and Gynecology

## 2016-02-18 DIAGNOSIS — Z1231 Encounter for screening mammogram for malignant neoplasm of breast: Secondary | ICD-10-CM

## 2016-03-01 ENCOUNTER — Ambulatory Visit (HOSPITAL_COMMUNITY)
Admission: RE | Admit: 2016-03-01 | Discharge: 2016-03-01 | Disposition: A | Discharge status: Home / Self Care | Payer: BC Managed Care – PPO | Source: Ambulatory Visit | Attending: Obstetrics and Gynecology | Admitting: Obstetrics and Gynecology

## 2016-03-01 DIAGNOSIS — Z1231 Encounter for screening mammogram for malignant neoplasm of breast: Secondary | ICD-10-CM

## 2016-04-14 ENCOUNTER — Encounter (INDEPENDENT_AMBULATORY_CARE_PROVIDER_SITE_OTHER): Payer: Self-pay | Admitting: *Deleted

## 2016-04-15 ENCOUNTER — Ambulatory Visit: Payer: BC Managed Care – PPO | Admitting: Family Medicine

## 2016-05-06 ENCOUNTER — Ambulatory Visit (INDEPENDENT_AMBULATORY_CARE_PROVIDER_SITE_OTHER): Payer: BC Managed Care – PPO | Admitting: Family Medicine

## 2016-05-06 ENCOUNTER — Encounter: Payer: Self-pay | Admitting: Family Medicine

## 2016-05-06 VITALS — BP 124/72 | HR 88 | Temp 98.2°F | Resp 12 | Ht 63.0 in | Wt 183.0 lb

## 2016-05-06 DIAGNOSIS — E119 Type 2 diabetes mellitus without complications: Secondary | ICD-10-CM | POA: Diagnosis not present

## 2016-05-06 DIAGNOSIS — Z1159 Encounter for screening for other viral diseases: Secondary | ICD-10-CM | POA: Diagnosis not present

## 2016-05-06 DIAGNOSIS — I10 Essential (primary) hypertension: Secondary | ICD-10-CM

## 2016-05-06 DIAGNOSIS — Z7189 Other specified counseling: Secondary | ICD-10-CM | POA: Diagnosis not present

## 2016-05-06 DIAGNOSIS — Z7689 Persons encountering health services in other specified circumstances: Secondary | ICD-10-CM

## 2016-05-06 NOTE — Progress Notes (Signed)
Subjective:    Patient ID: Carolyn Glass, female    DOB: 1956-04-11, 60 y.o.   MRN: [id number]  HPI Patient is a very pleasant 60 year old white female here today to establish care. Her mammogram is up-to-date. It is been 10 years since her last colonoscopy. She already has an appointment to see her gastroenterologist at the first of next year to have this performed. She sees a gynecologist for her Pap smears. She has had Prevnar 13. She is due for the shingles vaccine as well as Pneumovax 23. We discussed these today but she defers them. Past medical history is significant for borderline type 2 diabetes mellitus for which she takes metformin. She also has hypertension. In the past she's had a history of anxiety for which she takes Lexapro 20 mg a day. 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 Otherwise she is doing well with no concerns. She recently had a right-sided total hip replacement. She also has severe osteoarthritis in her left hip but she is deferring surgery at the present time on this. She also has a history of acid reflux for which she takes omeprazole. However the majority of her symptoms began after she started taking NSAIDs for her hip pain. Past Medical History:  Diagnosis Date  . Arthritis   . Asthma    allergy and exercise induced asthma   . Diabetes mellitus without complication ([place])   . GERD (gastroesophageal reflux disease)   . Hypertension   . Shortness of breath dyspnea    with exertion    Past Surgical History:  Procedure Laterality Date  . BREAST LUMPECTOMY     right   . DILATION AND CURETTAGE OF UTERUS    . JOINT REPLACEMENT    . TOTAL HIP ARTHROPLASTY Right 01/07/2016   Procedure: TOTAL HIP ARTHROPLASTY ANTERIOR APPROACH;  Surgeon: Gaynelle Arabian, MD;  Location: [facility];  Service: Orthopedics;  Laterality: Right;   Current Outpatient Prescriptions on File Prior to Visit  Medication Sig  Dispense Refill  . albuterol (PROVENTIL HFA;VENTOLIN HFA) 108 (90 Base) MCG/ACT inhaler Inhale 2 puffs into the lungs every 6 (six) hours as needed for wheezing or shortness of breath.    . benazepril (LOTENSIN) 20 MG tablet Take 20 mg by mouth 2 (two) times daily.    Marland Kitchen escitalopram (LEXAPRO) 20 MG tablet Take 20 mg by mouth daily.    . fexofenadine (ALLEGRA) 180 MG tablet Take 180 mg by mouth daily.    . metFORMIN (GLUCOPHAGE) 500 MG tablet Take 500 mg by mouth 2 (two) times daily with a meal.    . omeprazole (PRILOSEC) 40 MG capsule Take 40 mg by mouth daily.     No current facility-administered medications on file prior to visit.    Allergies  Allergen Reactions  . Azithromycin Itching and Rash  . Codeine Rash    Thinks a codeine cough syrup caused rash.   Social History   Social History  . Marital status: Married    Spouse name: N/A  . Number of children: N/A  . Years of education: N/A   Occupational History  . Not on file.   Social History Main Topics  . Smoking status: Never Smoker  . Smokeless tobacco: Never Used  . Alcohol use No  . Drug use: No  . Sexual activity: Yes   Other Topics Concern  . Not on file   Social History Narrative  .  No narrative on file   Family History  Problem Relation Age of Onset  . Arthritis Mother   . Asthma Mother   . Diabetes Mother   . Hyperlipidemia Mother   . Hypertension Mother   . Arthritis Father   . Asthma Father   . Stroke Father   . Hyperlipidemia Father   . Early death Brother 8    Muscular Dystrophy  . Birth defects Brother   . Cancer Maternal Uncle   . Mental retardation Maternal Uncle   . Cancer Maternal Grandmother     breast, mets to bone      Review of Systems  All other systems reviewed and are negative.      Objective:   Physical Exam  Constitutional: She is oriented to person, place, and time. She appears well-developed and well-nourished. No distress.  HENT:  Head: Normocephalic and  atraumatic.  Right Ear: External ear normal.  Left Ear: External ear normal.  Nose: Nose normal.  Mouth/Throat: Oropharynx is clear and moist. No oropharyngeal exudate.  Eyes: Conjunctivae and EOM are normal. Pupils are equal, round, and reactive to light. Right eye exhibits no discharge. Left eye exhibits no discharge. No scleral icterus.  Neck: Normal range of motion. Neck supple. No JVD present. No tracheal deviation present. No thyromegaly present.  Cardiovascular: Normal rate, regular rhythm, normal heart sounds and intact distal pulses.  Exam reveals no gallop and no friction rub.   No murmur heard. Pulmonary/Chest: Effort normal and breath sounds normal. No stridor. No respiratory distress. She has no wheezes. She has no rales. She exhibits no tenderness.  Abdominal: Soft. Bowel sounds are normal. She exhibits no distension and no mass. There is no tenderness. There is no rebound and no guarding.  Musculoskeletal: Normal range of motion. She exhibits no edema, tenderness or deformity.  Lymphadenopathy:    She has no cervical adenopathy.  Neurological: She is alert and oriented to person, place, and time. She has normal reflexes. She displays normal reflexes. No cranial nerve deficit. She exhibits normal muscle tone. Coordination normal.  Skin: Skin is warm. No rash noted. She is not diaphoretic. No erythema. No pallor.  Psychiatric: She has a normal mood and affect. Her behavior is normal. Judgment and thought content normal.  Vitals reviewed.         Assessment & Plan:  Controlled type 2 diabetes mellitus without complication, without long-term current use of insulin ([place]) - Plan: CBC with Differential/Platelet, COMPLETE METABOLIC PANEL WITH GFR, Lipid panel, Hemoglobin A1c, Microalbumin, urine  Need for hepatitis C screening test - Plan: Hepatitis C Ab Reflex HCV RNA, QUANT  Encounter to establish care with new doctor  Benign essential HTN  Her blood pressure today is well  controlled. She is requesting assistance with weight loss. She would like to come off metformin area and I recommended that she return fasting for hemoglobin A1c area and hemoglobin A1c reveals that she needs to continue some type of medication, I would recommend discontinuing metformin and possibly switching to Trulicity to help manage her diabetes and also assist in weight loss. I will also like to check a fasting lipid panel. Goal LDL cholesterol is less than 100. I would also like to check a urine microalbumin. She is due for hepatitis C screening. I recommended a shingles vaccine as well as Pneumovax 23 but she declined both. Her cancer screening is up-to-date. Her bone density is up-to-date.

## 2016-05-11 ENCOUNTER — Other Ambulatory Visit: Payer: BC Managed Care – PPO

## 2016-05-11 LAB — COMPLETE METABOLIC PANEL WITH GFR
ALBUMIN: 4 g/dL (ref 3.6–5.1)
ALK PHOS: 91 U/L (ref 33–130)
ALT: 26 U/L (ref 6–29)
AST: 21 U/L (ref 10–35)
BILIRUBIN TOTAL: 0.3 mg/dL (ref 0.2–1.2)
BUN: 11 mg/dL (ref 7–25)
CALCIUM: 8.9 mg/dL (ref 8.6–10.4)
CO2: 28 mmol/L (ref 20–31)
Chloride: 104 mmol/L (ref 98–110)
Creat: 0.68 mg/dL (ref 0.50–0.99)
Glucose, Bld: 131 mg/dL — ABNORMAL HIGH (ref 70–99)
POTASSIUM: 4.5 mmol/L (ref 3.5–5.3)
Sodium: 140 mmol/L (ref 135–146)
TOTAL PROTEIN: 6.6 g/dL (ref 6.1–8.1)

## 2016-05-11 LAB — LIPID PANEL
CHOL/HDL RATIO: 4.5 ratio (ref <=5.0)
CHOLESTEROL: 176 mg/dL (ref 125–200)
HDL: 39 mg/dL — AB (ref >=46)
LDL Cholesterol: 102 mg/dL (ref <130)
Triglycerides: 174 mg/dL — ABNORMAL HIGH (ref <150)
VLDL: 35 mg/dL — ABNORMAL HIGH (ref <30)

## 2016-05-11 LAB — CBC WITH DIFFERENTIAL/PLATELET
BASOS ABS: 44 {cells}/uL (ref 0–200)
Basophils Relative: 1 %
EOS ABS: 264 {cells}/uL (ref 15–500)
EOS PCT: 6 %
HEMATOCRIT: 39.2 % (ref 35.0–45.0)
HEMOGLOBIN: 12.9 g/dL (ref 12.0–15.0)
LYMPHS ABS: 1452 {cells}/uL (ref 850–3900)
Lymphocytes Relative: 33 %
MCH: 29.9 pg (ref 27.0–33.0)
MCHC: 32.9 g/dL (ref 32.0–36.0)
MCV: 90.7 fL (ref 80.0–100.0)
MONO ABS: 396 {cells}/uL (ref 200–950)
MPV: 10.5 fL (ref 7.5–12.5)
Monocytes Relative: 9 %
NEUTROS PCT: 51 %
Neutro Abs: 2244 cells/uL (ref 1500–7800)
Platelets: 299 10*3/uL (ref 140–400)
RBC: 4.32 MIL/uL (ref 3.80–5.10)
RDW: 14.4 % (ref 11.0–15.0)
WBC: 4.4 10*3/uL (ref 3.8–10.8)

## 2016-05-12 LAB — HEMOGLOBIN A1C
HEMOGLOBIN A1C: 6.7 % — AB (ref <5.7)
MEAN PLASMA GLUCOSE: 146 mg/dL

## 2016-05-12 LAB — HEPATITIS C ANTIBODY: HCV Ab: NEGATIVE

## 2016-05-27 ENCOUNTER — Other Ambulatory Visit: Payer: Self-pay | Admitting: Obstetrics and Gynecology

## 2016-05-27 DIAGNOSIS — Z803 Family history of malignant neoplasm of breast: Secondary | ICD-10-CM

## 2016-06-08 ENCOUNTER — Ambulatory Visit
Admission: RE | Admit: 2016-06-08 | Discharge: 2016-06-08 | Disposition: A | Discharge status: Home / Self Care | Payer: BC Managed Care – PPO | Source: Ambulatory Visit | Attending: Obstetrics and Gynecology | Admitting: Obstetrics and Gynecology

## 2016-06-08 DIAGNOSIS — Z803 Family history of malignant neoplasm of breast: Secondary | ICD-10-CM

## 2016-06-08 MED ORDER — GADOBENATE DIMEGLUMINE 529 MG/ML IV SOLN
16.0000 mL | Freq: Once | INTRAVENOUS | Status: AC | PRN
  Administered 2016-06-08: 16 mL via INTRAVENOUS

## 2016-06-10 ENCOUNTER — Other Ambulatory Visit: Payer: Self-pay | Admitting: Obstetrics and Gynecology

## 2016-06-10 DIAGNOSIS — R928 Other abnormal and inconclusive findings on diagnostic imaging of breast: Secondary | ICD-10-CM

## 2016-06-16 ENCOUNTER — Ambulatory Visit
Admission: RE | Admit: 2016-06-16 | Discharge: 2016-06-16 | Disposition: A | Discharge status: Home / Self Care | Payer: BC Managed Care – PPO | Source: Ambulatory Visit | Attending: Obstetrics and Gynecology | Admitting: Obstetrics and Gynecology

## 2016-06-16 ENCOUNTER — Other Ambulatory Visit: Payer: Self-pay | Admitting: Obstetrics and Gynecology

## 2016-06-16 DIAGNOSIS — R928 Other abnormal and inconclusive findings on diagnostic imaging of breast: Secondary | ICD-10-CM

## 2016-06-18 ENCOUNTER — Telehealth: Payer: Self-pay | Admitting: *Deleted

## 2016-06-18 DIAGNOSIS — C50511 Malignant neoplasm of lower-outer quadrant of right female breast: Secondary | ICD-10-CM

## 2016-06-18 NOTE — Telephone Encounter (Signed)
Left vm to discuss [place] on 06/23/16. Contact information provided.

## 2016-06-21 ENCOUNTER — Encounter: Payer: Self-pay | Admitting: *Deleted

## 2016-06-21 DIAGNOSIS — C50511 Malignant neoplasm of lower-outer quadrant of right female breast: Secondary | ICD-10-CM | POA: Insufficient documentation

## 2016-06-21 HISTORY — DX: Malignant neoplasm of lower-outer quadrant of right female breast: C50.511

## 2016-06-21 NOTE — Telephone Encounter (Signed)
Confirmed [facility] for 06/23/16 at 0815 .  Instructions and contact information given.

## 2016-06-23 ENCOUNTER — Encounter: Payer: Self-pay | Admitting: Hematology

## 2016-06-23 ENCOUNTER — Ambulatory Visit: Payer: BC Managed Care – PPO | Attending: General Surgery | Admitting: Physical Therapy

## 2016-06-23 ENCOUNTER — Ambulatory Visit (HOSPITAL_BASED_OUTPATIENT_CLINIC_OR_DEPARTMENT_OTHER): Payer: BC Managed Care – PPO | Admitting: Hematology

## 2016-06-23 ENCOUNTER — Ambulatory Visit
Admission: RE | Admit: 2016-06-23 | Discharge: 2016-06-23 | Disposition: A | Discharge status: Home / Self Care | Payer: BC Managed Care – PPO | Source: Ambulatory Visit | Attending: Radiation Oncology | Admitting: Radiation Oncology

## 2016-06-23 ENCOUNTER — Other Ambulatory Visit (HOSPITAL_BASED_OUTPATIENT_CLINIC_OR_DEPARTMENT_OTHER): Payer: BC Managed Care – PPO

## 2016-06-23 ENCOUNTER — Encounter: Payer: Self-pay | Admitting: *Deleted

## 2016-06-23 ENCOUNTER — Encounter: Payer: Self-pay | Admitting: Physical Therapy

## 2016-06-23 VITALS — BP 137/79 | HR 81 | Temp 98.6°F | Resp 18 | Ht 63.0 in | Wt 178.8 lb

## 2016-06-23 DIAGNOSIS — Z170 Estrogen receptor positive status [ER+]: Secondary | ICD-10-CM | POA: Diagnosis not present

## 2016-06-23 DIAGNOSIS — R293 Abnormal posture: Secondary | ICD-10-CM | POA: Diagnosis not present

## 2016-06-23 DIAGNOSIS — C50511 Malignant neoplasm of lower-outer quadrant of right female breast: Secondary | ICD-10-CM

## 2016-06-23 DIAGNOSIS — E119 Type 2 diabetes mellitus without complications: Secondary | ICD-10-CM

## 2016-06-23 LAB — CBC WITH DIFFERENTIAL/PLATELET
BASO%: 0.2 % (ref 0.0–2.0)
Basophils Absolute: 0 10*3/uL (ref 0.0–0.1)
EOS ABS: 0.2 10*3/uL (ref 0.0–0.5)
EOS%: 3.7 % (ref 0.0–7.0)
HCT: 39.2 % (ref 34.8–46.6)
HEMOGLOBIN: 12.8 g/dL (ref 11.6–15.9)
LYMPH#: 1.3 10*3/uL (ref 0.9–3.3)
LYMPH%: 26.6 % (ref 14.0–49.7)
MCH: 30.8 pg (ref 25.1–34.0)
MCHC: 32.7 g/dL (ref 31.5–36.0)
MCV: 94.2 fL (ref 79.5–101.0)
MONO#: 0.4 10*3/uL (ref 0.1–0.9)
MONO%: 7.7 % (ref 0.0–14.0)
NEUT%: 61.8 % (ref 38.4–76.8)
NEUTROS ABS: 3 10*3/uL (ref 1.5–6.5)
Platelets: 250 10*3/uL (ref 145–400)
RBC: 4.16 10*6/uL (ref 3.70–5.45)
RDW: 13.7 % (ref 11.2–14.5)
WBC: 4.9 10*3/uL (ref 3.9–10.3)

## 2016-06-23 LAB — COMPREHENSIVE METABOLIC PANEL
ALBUMIN: 3.7 g/dL (ref 3.5–5.0)
ALK PHOS: 110 U/L (ref 40–150)
ALT: 19 U/L (ref 0–55)
AST: 18 U/L (ref 5–34)
Anion Gap: 9 mEq/L (ref 3–11)
BILIRUBIN TOTAL: 0.59 mg/dL (ref 0.20–1.20)
BUN: 12.3 mg/dL (ref 7.0–26.0)
CO2: 25 mEq/L (ref 22–29)
Calcium: 9.4 mg/dL (ref 8.4–10.4)
Chloride: 105 mEq/L (ref 98–109)
Creatinine: 0.8 mg/dL (ref 0.6–1.1)
EGFR: 81 mL/min/{1.73_m2} — AB (ref >90)
GLUCOSE: 253 mg/dL — AB (ref 70–140)
Potassium: 3.8 mEq/L (ref 3.5–5.1)
SODIUM: 139 meq/L (ref 136–145)
TOTAL PROTEIN: 6.8 g/dL (ref 6.4–8.3)

## 2016-06-23 NOTE — Patient Instructions (Signed)

## 2016-06-23 NOTE — Progress Notes (Signed)
Clinical Social Work [place] Psychosocial Distress Screening [place]  Patient completed distress screening protocol and scored a 5 on the Psychosocial Distress Thermometer which indicates moderate distress. Clinical Social Worker met with patient and patients husband in [facility] to assess for distress and other psychosocial needs. Patient stated she was feeling overwhelmed but felt "better" after meeting with the treatment team and getting more information on her treatment plan. CSW and patient discussed common feeling and emotions when being diagnosed with cancer, and the importance of support during treatment. CSW informed patient of the support team and support services at [facility], and patient was agreeable to an [place] guide referral. CSW provided contact information and encouraged patient to call with any questions or concerns.  ON[facility]N DISTRESS SCREENING 06/23/2016  Screening Type Initial Screening  Distress experienced in past week (1-10) 5  Practical problem type Work/school  Family Problem type Other (comment)  Emotional problem type Nervousness/Anxiety  Physical Problem type Pain  Physician notified of physical symptoms Yes  Referral to clinical social work Yes  Referral to support programs Yes     Johnnye Lana, MSW, LCSW, OSW-C Clinical Social Worker [place] [phone]

## 2016-06-23 NOTE — Therapy (Addendum)
[place], [place], [address] Phone: [phone]   Fax:  [phone]  Physical Therapy Evaluation  Patient Details  Name: Carolyn Glass MRN: [id number] Date of Birth: [birth year] Referring Provider: Dr. [name]  Encounter Date: 06/23/2016      PT End of Session - 06/23/16 1011    Visit Number 1   Number of Visits 1   PT Start Time 0946   PT Stop Time 5916  Also saw pt from 1014-1028 for a total of 30 minutes   PT Time Calculation (min) 16 min   Activity Tolerance Patient tolerated treatment well   Behavior During Therapy [facility] for tasks assessed/performed      Past Medical History:  Diagnosis Date  . Arthritis   . Asthma    allergy and exercise induced asthma   . Diabetes mellitus without complication ([place])   . GERD (gastroesophageal reflux disease)   . Hypertension   . Malignant neoplasm of lower-outer quadrant of right female breast ([place]) 06/21/2016  . Shortness of breath dyspnea    with exertion     Past Surgical History:  Procedure Laterality Date  . BREAST LUMPECTOMY     right   . DILATION AND CURETTAGE OF UTERUS    . JOINT REPLACEMENT    . TOTAL HIP ARTHROPLASTY Right 01/07/2016   Procedure: TOTAL HIP ARTHROPLASTY ANTERIOR APPROACH;  Surgeon: Gaynelle Arabian, MD;  Location: [facility];  Service: Orthopedics;  Laterality: Right;    There were no vitals filed for this visit.       Subjective Assessment - 06/23/16 1032    Subjective Patient reports she is here to see her medical team for her newly diagnosed right breast cancer.   Patient is accompained by: Family member   Pertinent History Patient was diagnosed on 06/08/16 with right grade 2 lobular carcinoma breast cancer.  It is ER/PR positive and HER2 negative with a Ki67 of 10%.  There are 2 masses measuring 1 cm and 1.4 cm located in the lower outer quadrant. There was also a finding on her breast MRI showing an abnormality  in her liver which will warrant a liver MRI.  She recently had a right total hip replacement in 5/17 and reports she needs to have her left hip replaced. She also has asthma.   Patient Stated Goals Reduce lymphedema risk and learn post op shoulder ROM HEP            [facility] PT Assessment - 06/23/16 0001      Assessment   Medical Diagnosis Right breast cancer   Referring Provider Dr. [name]   Onset Date/Surgical Date 06/08/16   Hand Dominance Right   Prior Therapy none     Precautions   Precautions Other (comment)   Precaution Comments active breast cancer     Restrictions   Weight Bearing Restrictions No     Balance Screen   Has the patient fallen in the past 6 months No   Has the patient had a decrease in activity level because of a fear of falling?  No   Is the patient reluctant to leave their home because of a fear of falling?  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   Leisure She does not exercise and is limited due to her left hip pain     Cognition   Overall Cognitive Status Within Functional Limits for tasks assessed     Posture/Postural Control   Posture/Postural Control Postural limitations   Postural Limitations Forward head;Rounded Shoulders     ROM / Strength   AROM / PROM / Strength AROM;Strength     AROM   AROM Assessment Site Shoulder;Cervical   Right/Left Shoulder Right;Left   Right Shoulder Extension 45 Degrees   Right Shoulder Flexion 146 Degrees   Right Shoulder ABduction 164 Degrees   Right Shoulder Internal Rotation 75 Degrees   Right Shoulder External Rotation 80 Degrees   Left Shoulder Extension 45 Degrees   Left Shoulder Flexion 139 Degrees    Left Shoulder ABduction 153 Degrees   Left Shoulder Internal Rotation 62 Degrees   Left Shoulder External Rotation 80 Degrees   Cervical Flexion WNL   Cervical Extension WNL   Cervical - Right Side Bend WNL   Cervical - Left Side Bend WNL   Cervical - Right Rotation 25% limited   Cervical - Left Rotation 25% limited     Strength   Overall Strength Within functional limits for tasks performed           LYMPHEDEMA/ONCOLOGY QUESTIONNAIRE - 06/23/16 1010      Type   Cancer Type Right breast cancer     Lymphedema Assessments   Lymphedema Assessments Upper extremities     Right Upper Extremity Lymphedema   10 cm Proximal to Olecranon Process 30 cm   Olecranon Process 26.5 cm   10 cm Proximal to Ulnar Styloid Process 25.7 cm   Just Proximal to Ulnar Styloid Process 18.2 cm   Across Hand at [employer] 19.6 cm   At [place] of 2nd Digit 6.9 cm     Left Upper Extremity Lymphedema   10 cm Proximal to Olecranon Process 29.3 cm   Olecranon Process 27 cm   10 cm Proximal to Ulnar Styloid Process 25.2 cm   Just Proximal to Ulnar Styloid Process 18.2 cm   Across Hand at [employer] 19.7 cm   At [place] of 2nd Digit 6.9 cm          Patient was instructed today in a home exercise program today for post op shoulder range of motion. These included active assist shoulder flexion in sitting, scapular retraction, wall walking with shoulder abduction, and hands behind head external rotation.  She was encouraged to do these twice a day, holding 3 seconds and repeating 5 times when permitted by her physician.                  PT Education - 06/23/16 1011    Education provided Yes   Education Details Lymphedema risk reduction and post op shoulder ROM HEP   Person(s) Educated Patient;Spouse   Methods Explanation;Demonstration;Handout   Comprehension Returned demonstration;Verbalized understanding              Breast Clinic Goals - 06/23/16 1038      Patient  will be able to verbalize understanding of pertinent lymphedema risk reduction practices relevant to her diagnosis specifically related to skin care.   Time 1   Period Days   Status Achieved     Patient will be able to return demonstrate and/or verbalize understanding of the post-op home exercise program related to regaining shoulder  range of motion.   Time 1   Period Days   Status Achieved     Patient will be able to verbalize understanding of the importance of attending the postoperative After Breast Cancer Class for further lymphedema risk reduction education and therapeutic exercise.   Time 1   Period Days   Status Achieved              Plan - 06/23/16 1032    Clinical Impression Statement Patient was diagnosed on 06/08/16 with right grade 2 lobular carcinoma breast cancer.  It is ER/PR positive and HER2 negative with a Ki67 of 10%.  There are 2 masses measuring 1 cm and 1.4 cm located in the lower outer quadrant. There was also a finding on her breast MRI showing an abnormality in her liver which will warrant a liver MRI.  She recently had a right total hip replacement in 5/17 and reports she needs to have her left hip replaced. She also has asthma.  Her multidisciplinary medical team met prior to her assessments to determine a recommended treatment plan.  She is planning to have a right mastectomy with reconstruction and a sentinel node biopsy followed by Oncotype testing and anti-estrogen therapy.  She will benefit from post op PT to regain shoulder ROM and reduce lymphedema risk.  Due to her lack of comorbidities that would impact rehab, her eval is of low complexity.   Rehab Potential Excellent   Clinical Impairments Affecting Rehab Potential None   PT Frequency One time visit   PT Treatment/Interventions Patient/family education;Therapeutic exercise   PT Next Visit Plan Will f/u after surgery to determine need for PT   PT Home Exercise Plan Post op shoulder ROM HEP   Consulted  and Agree with Plan of Care Patient;Family member/caregiver   Family Member Consulted Husband      Patient will benefit from skilled therapeutic intervention in order to improve the following deficits and impairments:  Postural dysfunction, Decreased knowledge of precautions, Pain, Impaired UE functional use, Decreased range of motion  Visit Diagnosis: Abnormal posture - Plan: PT plan of care cert/re-cert  Carcinoma of lower-outer quadrant of right breast in female, estrogen receptor positive ([place]) - Plan: PT plan of care cert/re-cert   Patient will follow up at outpatient cancer rehab if needed following surgery.  If the patient requires physical therapy at that time, a specific plan will be dictated and sent to the referring physician for approval. The patient was educated today on appropriate basic range of motion exercises to begin post operatively and the importance of attending the After Breast Cancer class following surgery.  Patient was educated today on lymphedema risk reduction practices as it pertains to recommendations that will benefit the patient immediately following surgery.  She verbalized good understanding.  No additional physical therapy is indicated at this time.      Problem List Patient Active Problem List   Diagnosis Date Noted  . Malignant neoplasm of lower-outer quadrant of right female breast ([place]) 06/21/2016  . OA (osteoarthritis) of hip 01/07/2016    Annia Friendly, PT 06/23/16 2:14 PM  [place], [place], [address] Phone: [phone]   Fax:  [phone]  Name: Carolyn Glass MRN: [id number] Date of Birth: Feb 03, 1956

## 2016-06-23 NOTE — Progress Notes (Addendum)
[place]  [phone] Fax:([phone]) [place] Note   Patient Care Team: [name], MD as PCP - General (Family Medicine) [name], MD as Consulting Physician (General Surgery) [name], MD as Consulting Physician (Hematology) [name], MD as Attending Physician (Radiation Oncology) 06/23/2016  CHIEF COMPLAINTS/PURPOSE OF CONSULTATION:  Newly diagnosed right multifocal breast cancer  Oncology History   Malignant neoplasm of lower-outer quadrant of right female breast [facility])   Staging form: Breast, AJCC 7th Edition   - Clinical stage from 06/23/2016: Stage IA (T1c, N0, M0) - Unsigned      Malignant neoplasm of lower-outer quadrant of right female breast ([place])   03/01/2016 Mammogram    Bilateral screening mammogram was negative      06/08/2016 Imaging    Bilateral breast MRI with and without contrast showed 4 lesions in the right breast, 2 adjacent 1.4 cm mass in the inferior aspect of the right breast, and in the right lateral aspect of the breast, there are 8 mm and a 7 mm irregular enhancing mass. Multiple small enhancing lesions within the liver, indeterminate. No axillary adenopathy.      06/16/2016 Initial Diagnosis    Malignant neoplasm of lower-outer quadrant of right female breast ([place])      06/16/2016 Initial Biopsy    Right breast needle core biopsy at 9 clock, 6:30 clock, 6 clock showed invasive bladder carcinoma, and LCIS      06/16/2016 Receptors her2    two right breast biopsy showed ER strongly positive, PR strongly positive, HER-2 negative, Ki-67 5-10%       06/16/2016 Imaging    Right breast and axilla ultrasound showed a 1.4 cm mass at the 6:30 o'clock, 1.0 cm mass at the 6:00, and 53m mass at 9:00 position. Axilla was negative.       HISTORY OF PRESENTING ILLNESS:  Carolyn DRISKILL60y.o. female is here because of her newly diagnosed right breast cancer. She is accompanied by her  husband to our multidisciplinary clinic today.  She [facility] right breast [facility] which was negative for malignancy. She also had right breast lumpectomy in 2009 for benign lesion. Her last screening mammogram was negative in June 2017. Due to her family history of breast cancer, her primary care physician felt she is at high risk for breast cancer, and referred her for breast MRI on 06/08/2016, which showed 4 small lesions in the right breast. She underwent ultrasound of the right breast and axilla, which showed 2 adjacent breast mass at 6:00 position, and a smaller mass at 9 clock position, axillary was negative. All of the 3 right breast lesions were biopsied under ultrasound guidance, which all showed invasive lobular carcinoma and LCIS. He appears dry positive, HER-2 negative.  She feels well, denies any significant pain, except arthritis pain in the left hip, manageable. She has good appetite and energy level, works full-time at [employer] She is married, lives with her husband. Her paternal grandmother and paternal cousin had breast cancer in their 460s  GYN HISTORY  Menarchal: 11  LMP: 60yo  Contraceptive: no  HRT: no  G5P1: one daughter 253yo    MEDICAL HISTORY:  Past Medical History:  Diagnosis Date  . Arthritis   . Asthma    allergy and exercise induced asthma   . Diabetes mellitus without complication ([place]   . GERD (gastroesophageal reflux disease)   . Hypertension   . Malignant neoplasm  of lower-outer quadrant of right female breast ([place]) 06/21/2016  . Shortness of breath dyspnea    with exertion     SURGICAL HISTORY: Past Surgical History:  Procedure Laterality Date  . BREAST LUMPECTOMY     right   . DILATION AND CURETTAGE OF UTERUS    . JOINT REPLACEMENT    . TOTAL HIP ARTHROPLASTY Right 01/07/2016   Procedure: TOTAL HIP ARTHROPLASTY ANTERIOR APPROACH;  Surgeon: Gaynelle Arabian, MD;  Location: [facility];  Service: Orthopedics;  Laterality: Right;     SOCIAL HISTORY: Social History   Social History  . Marital status: Married    Spouse name: N/A  . Number of children: N/A  . Years of education: N/A   Occupational History  . Not on file.   Social History Main Topics  . Smoking status: Never Smoker  . Smokeless tobacco: Never Used  . Alcohol use No  . Drug use: No  . Sexual activity: Yes   Other Topics Concern  . Not on file   Social History Narrative  . No narrative on file    FAMILY HISTORY: Family History  Problem Relation Age of Onset  . Arthritis Mother   . Asthma Mother   . Diabetes Mother   . Hyperlipidemia Mother   . Hypertension Mother   . Arthritis Father   . Asthma Father   . Stroke Father   . Hyperlipidemia Father   . Early death Brother 8    Muscular Dystrophy  . Birth defects Brother   . Cancer Maternal Uncle   . Mental retardation Maternal Uncle   . Cancer Maternal Grandmother     breast, mets to bone  . Cancer Paternal Grandmother 5    breast cancer   . Cancer Cousin 48    breast cancer     ALLERGIES:  is allergic to azithromycin; biaxin [clarithromycin]; and codeine.  MEDICATIONS:  Current Outpatient Prescriptions  Medication Sig Dispense Refill  . Acetaminophen (TYLENOL ARTHRITIS PAIN PO) Take by mouth 2 (two) times daily.    . benazepril (LOTENSIN) 20 MG tablet Take 20 mg by mouth 2 (two) times daily.    Marland Kitchen escitalopram (LEXAPRO) 20 MG tablet Take 20 mg by mouth daily.    . fexofenadine (ALLEGRA) 180 MG tablet Take 180 mg by mouth daily.    Marland Kitchen omeprazole (PRILOSEC) 40 MG capsule Take 40 mg by mouth daily.    Marland Kitchen albuterol (PROVENTIL HFA;VENTOLIN HFA) 108 (90 Base) MCG/ACT inhaler Inhale 2 puffs into the lungs every 6 (six) hours as needed for wheezing or shortness of breath.    . metFORMIN (GLUCOPHAGE) 500 MG tablet Take 500 mg by mouth 2 (two) times daily with a meal.     No current facility-administered medications for this visit.     REVIEW OF SYSTEMS:   Constitutional:  Denies fevers, chills or abnormal night sweats Eyes: Denies blurriness of vision, double vision or watery eyes Ears, nose, mouth, throat, and face: Denies mucositis or sore throat Respiratory: Denies cough, dyspnea or wheezes Cardiovascular: Denies palpitation, chest discomfort or lower extremity swelling Gastrointestinal:  Denies nausea, heartburn or change in bowel habits Skin: Denies abnormal skin rashes Lymphatics: Denies new lymphadenopathy or easy bruising Neurological:Denies numbness, tingling or new weaknesses Behavioral/Psych: Mood is stable, no new changes  All other systems were reviewed with the patient and are negative.  PHYSICAL EXAMINATION: ECOG PERFORMANCE STATUS: 0 - Asymptomatic  Vitals:   06/23/16 0844  BP: 137/79  Pulse: 81  Resp: 18  Temp: 98.6 F (37 C)   Filed Weights   06/23/16 0844  Weight: 178 lb 12.8 oz (81.1 kg)    GENERAL:alert, no distress and comfortable SKIN: skin color, texture, turgor are normal, no rashes or significant lesions EYES: normal, conjunctiva are pink and non-injected, sclera clear OROPHARYNX:no exudate, no erythema and lips, buccal mucosa, and tongue normal  NECK: supple, thyroid normal size, non-tender, without nodularity LYMPH:  no palpable lymphadenopathy in the cervical, axillary or inguinal LUNGS: clear to auscultation and percussion with normal breathing effort HEART: regular rate & rhythm and no murmurs and no lower extremity edema ABDOMEN:abdomen soft, non-tender and normal bowel sounds Musculoskeletal:no cyanosis of digits and no clubbing  PSYCH: alert & oriented x 3 with fluent speech NEURO: no focal motor/sensory deficits  Breasts: Breast inspection showed them to be symmetrical with no nipple discharge. (+) Skin ecchymosis at the right breast biopsy sites. Palpation of the breasts and axilla revealed no obvious mass that I could appreciate.   LABORATORY DATA:  I have reviewed the data as listed CBC Latest Ref Rng  & Units 06/23/2016 05/11/2016 01/09/2016  WBC 3.9 - 10.3 10e3/uL 4.9 4.4 17.3(H)  Hemoglobin 11.6 - 15.9 g/dL 12.8 12.9 12.1  Hematocrit 34.8 - 46.6 % 39.2 39.2 36.3  Platelets 145 - 400 10e3/uL 250 299 267   CMP Latest Ref Rng & Units 06/23/2016 05/11/2016 01/09/2016  Glucose 70 - 140 mg/dl 253(H) 131(H) 169(H)  BUN 7.0 - 26.0 mg/dL 12._0 Creatinine 0.6 - 1.1 mg/dL 0.8 0.68 0.65  Sodium 136 - 145 mEq/L 139 140 140  Potassium 3.5 - 5.1 mEq/L 3.8 4.5 3.8  Chloride 98 - 110 mmol/L - 104 104  CO2 22 - 29 mEq/L _1 Calcium 8.4 - 10.4 mg/dL 9.4 8.9 9.1  Total Protein 6.4 - 8.3 g/dL 6.8 6.6 -  Total Bilirubin 0.20 - 1.20 mg/dL 0.59 0.3 -  Alkaline Phos 40 - 150 U/L 110 91 -  AST 5 - 34 U/L 18 21 -  ALT 0 - 55 U/L 19 26 -   PATHOLOGY REPORT  Diagnosis 06/16/2016 1. Breast, right, needle core biopsy, 9 o'clock - INVASIVE LOBULAR CARCINOMA. - LOBULAR CARCINOMA IN SITU. - SEE COMMENT. 2. Breast, right, needle core biopsy, 6:30 o'clock - INVASIVE LOBULAR CARCINOMA. - LOBULAR CARCINOMA IN SITU. - SEE COMMENT. 3. Breast, right, needle core biopsy, 6 o'clock - INVASIVE LOBULAR CARCINOMA. - LOBULAR CARCINOMA IN SITU. - SEE COMMENT. Microscopic Comment 1. Smooth muscle myosin, p63 and calponin stains are performed on the first specimen (right 9 o'clock needle core biopsy). The stains confirm the presence of both invasive and in situ carcinoma. An E-cadherin immunohistochemical stain is also performed. This additional stain is negative in both the invasive and in situ components confirming a lobular phenotype. Although definitive grading of breast carcinoma is best done on excision, the features of the invasive tumor from the right 9 o'clock breast biopsy are compatible with a grade 2 breast carcinoma. Breast prognostic markers will be performed and reported in an addendum. Findings are called to the [facility] on 06/18/16. Dr. Vicente Males has seen this specimen in  consultation with agreement. 2. Smooth muscle myosin, p63 and calponin stains are performed on the second specimen (right 6:30 o'clock needle core biopsy). The stains confirm the presence of both invasive and in situ carcinoma. An E-cadherin immunohistochemical stain is also performed. This additional stain is negative in both the invasive and in situ  components confirming a lobular phenotype. Although definitive grading of breast carcinoma is best done on excision, the features of the invasive tumor from the right 6:30 o'clock biopsy are compatible with a grade 2 breast carcinoma. Breast prognostic markers will be performed and reported in an addendum. Findings are called to the [facility] on 06/18/16. Dr. Vicente Males has seen this specimen in consultation with agreement. 3. An E-cadherin immunohistochemical stain is performed on the third specimen (right 6 o'clock breast biopsy). The stain fails to demonstrate positivity in the invasive or in situ component confirming the lobular nature of both. Although definitive grading of breast carcinoma is best done on excision, the features of the invasive tumor from the right 6 o'clock breast biopsy are compatible with a grade 2 breast carcinoma. Breast prognostic markers will be performed and reported in an addendum. Findings are called to the [facility] on 06/18/16. Dr. Vicente Males has seen this specimen in consultation with agreement. (RAH:gt, 06/18/16) Willeen Niece MD  1. Results: IMMUNOHISTOCHEMICAL AND MORPHOMETRIC ANALYSIS PERFORMED MANUALLY Estrogen Receptor: 95%, POSITIVE, STRONG STAINING INTENSITY Progesterone Receptor: 95%, POSITIVE, STRONG STAINING INTENSITY Proliferation Marker Ki67: 10%  Results: HER2 - NEGATIVE RATIO OF HER2/CEP17 SIGNALS 1.16 AVERAGE HER2 COPY NUMBER PER CELL 2.20  2. PROGNOSTIC INDICATORS Insufficient tissue   3. PROGNOSTIC INDICATORS Results: IMMUNOHISTOCHEMICAL AND  MORPHOMETRIC ANALYSIS PERFORMED MANUALLY Estrogen Receptor: 100%, POSITIVE, STRONG STAINING INTENSITY Progesterone Receptor: 100%, POSITIVE, STRONG STAINING INTENSITY Proliferation Marker Ki67: 5% Results: HER2 - NEGATIVE RATIO OF HER2/CEP17 SIGNALS 1.19 AVERAGE HER2 COPY NUMBER PER CELL 3.15   RADIOGRAPHIC STUDIES: I have personally reviewed the radiological images as listed and agreed with the findings in the report. Mr Breast Bilateral W Wo Contrast  Result Date: 06/08/2016 CLINICAL DATA:  Patient for high risk screening MRI. Significant family history of breast cancer. History of benign right surgical biopsy. EXAM: BILATERAL BREAST MRI WITH AND WITHOUT CONTRAST TECHNIQUE: Multiplanar, multisequence MR images of both breasts were obtained prior to and following the intravenous administration of 16 ml of MultiHance. THREE-DIMENSIONAL MR IMAGE RENDERING ON INDEPENDENT WORKSTATION: Three-dimensional MR images were rendered by post-processing of the original MR data on an independent workstation. The three-dimensional MR images were interpreted, and findings are reported in the following complete MRI report for this study. Three dimensional images were evaluated at the independent Dyna[employer] workstation COMPARISON:  Previous exam(s). FINDINGS: Breast composition: b.  Scattered fibroglandular tissue. Background parenchymal enhancement: Mild Right breast: Within the inferior aspect of the right breast centrally there is a 1.4 x 1.6 x 1.1 cm irregular mass. Immediately adjacent (slightly anterior and lateral to this mass is an additional 1.4 cm irregular mass. In total these masses measure 2.4 x 1.1 x 1.4 cm. Within the lateral aspect of the right breast posteriorly there is an 8 mm enhancing mass with susceptibility artifact compatible with previously biopsied benign right fibroadenoma. Within the lateral right breast middle depth there is an indeterminate 7 mm irregular enhancing mass. Within the anterior  lateral right breast there is an indeterminate 7 mm irregular enhancing mass. Left breast: Within the subareolar left breast there is a 9 x 8 x 6 mm oval circumscribed enhancing mass. No additional suspicious areas of enhancement identified within the left breast. Lymph nodes: No abnormal appearing lymph nodes. Ancillary findings: Multiple small enhancing lesions are demonstrated within the liver which are incompletely characterized. Reference 8 mm lesion (image 114; series 8). Additionally there are multiple T2 bright lesions within the liver which  are incompletely characterized. IMPRESSION: Two adjacent irregular enhancing masses within the inferior aspect of the right breast concerning for the possibility of primary breast malignancy. Two additional indeterminate irregular enhancing masses within the lateral right breast. Indeterminate oval enhancing mass within the subareolar left breast. Indeterminate enhancing lesions within the liver. Indeterminate T2 bright lesions within the liver. RECOMMENDATION: Recommend second-look ultrasound and attempt to identify the irregular enhancing masses within the inferior aspect of the right breast and the two 7 mm enhancing masses within the lateral aspect of the right breast. Recommend second-look ultrasound in an attempt to identify the retroareolar left breast mass. Recommend biopsy of these masses by ultrasound if they are apparent sonographically. Recommend MRI guided core needle biopsy of any mass that is unable to be identified or characterized sonographically. Recommend dedicated abdominal MRI for further evaluation of hepatic lesions. BI-RADS CATEGORY  4: Suspicious. Electronically Signed   By: Lovey Newcomer M.D.   On: 06/08/2016 12:11   US Breast Ltd Uni Left Inc Axilla  Result Date: 06/16/2016 CLINICAL DATA:  Strong family history of breast cancer. Recent screening MRI showed multiple enhancing masses in the right breast and a subareolar area of enhancement in  the left breast. EXAM: ULTRASOUND OF THE BILATERAL BREAST COMPARISON:  Previous exam(s). FINDINGS: On physical exam, I do not palpate a mass in the lateral aspect of the right breast or 6 o'clock region of the right breast. I do not palpate a mass in the subareolar region of the left breast. Both nipples are symmetric. The left nipple is not inverted and there is no erythema or ulceration. Targeted ultrasound is performed, showing an irregular spiculated mass in the right breast at 6:30 4 cm from the nipple measuring 1.1 x 1.3 x 1.4 cm. Adjacent to this is an irregular spiculated mass at 6 o'clock 4 cm from the nipple measuring 0.9 x 1.0 x 1.0 cm. There is an irregular hypoechoic mass in the right breast at 9 o'clock 4 cm from the nipple measuring 7 x 8 x 4 mm. Sonographic evaluation of the right axilla does not show any enlarged adenopathy. Sonographic evaluation of the subareolar region of the left breast shows normal tissue. There is no suspicious mass, abnormal shadowing or distortion. The lesion seen on the MRI is thought to be the nipple. The nipple has a normal appearance clinically. IMPRESSION: Three suspicious masses in the right breast. RECOMMENDATION: Ultrasound-guided core biopsies of the masses in the 9 o'clock, 6:30 o'clock and 6 o'clock regions of the right breast recommended. This will be performed and dictated separately. I have discussed the findings and recommendations with the patient. Results were also provided in writing at the conclusion of the visit. If applicable, a reminder letter will be sent to the patient regarding the next appointment. BI-RADS CATEGORY  5: Highly suggestive of malignancy - appropriate action should be taken. Electronically Signed   By: Lillia Mountain M.D.   On: 06/16/2016 15:25   US Breast Ltd Uni Right Inc Axilla  Result Date: 06/16/2016 CLINICAL DATA:  Strong family history of breast cancer. Recent screening MRI showed multiple enhancing masses in the right breast  and a subareolar area of enhancement in the left breast. EXAM: ULTRASOUND OF THE BILATERAL BREAST COMPARISON:  Previous exam(s). FINDINGS: On physical exam, I do not palpate a mass in the lateral aspect of the right breast or 6 o'clock region of the right breast. I do not palpate a mass in the subareolar region of the left breast.  Both nipples are symmetric. The left nipple is not inverted and there is no erythema or ulceration. Targeted ultrasound is performed, showing an irregular spiculated mass in the right breast at 6:30 4 cm from the nipple measuring 1.1 x 1.3 x 1.4 cm. Adjacent to this is an irregular spiculated mass at 6 o'clock 4 cm from the nipple measuring 0.9 x 1.0 x 1.0 cm. There is an irregular hypoechoic mass in the right breast at 9 o'clock 4 cm from the nipple measuring 7 x 8 x 4 mm. Sonographic evaluation of the right axilla does not show any enlarged adenopathy. Sonographic evaluation of the subareolar region of the left breast shows normal tissue. There is no suspicious mass, abnormal shadowing or distortion. The lesion seen on the MRI is thought to be the nipple. The nipple has a normal appearance clinically. IMPRESSION: Three suspicious masses in the right breast. RECOMMENDATION: Ultrasound-guided core biopsies of the masses in the 9 o'clock, 6:30 o'clock and 6 o'clock regions of the right breast recommended. This will be performed and dictated separately. I have discussed the findings and recommendations with the patient. Results were also provided in writing at the conclusion of the visit. If applicable, a reminder letter will be sent to the patient regarding the next appointment. BI-RADS CATEGORY  5: Highly suggestive of malignancy - appropriate action should be taken. Electronically Signed   By: Lillia Mountain M.D.   On: 06/16/2016 15:25   Mm Diag Breast Tomo Uni Right  Result Date: 06/16/2016 CLINICAL DATA:  Recent MRI showed multiple enhancing lesions in the right breast. Strong family  history of breast cancer. Status post 3 ultrasound-guided core biopsies. EXAM: 3D DIAGNOSTIC RIGHT MAMMOGRAM POST ULTRASOUND BIOPSY COMPARISON:  Previous exam(s). FINDINGS: 3D Mammographic images were obtained following ultrasound guided biopsies of the right breast. Mammographic images show there is a heart shaped clip in the 6 o'clock region of the right breast, a coil shaped clip in the 6:30 region of the right breast and a ribbon shaped clip in the 9 o'clock region of the right breast. There is a wing shaped clip in the upper-outer quadrant of the right breast from a prior benign biopsy. IMPRESSION: Status post ultrasound-guided core biopsies of the right breast with pathology pending. Final Assessment: Post Procedure Mammograms for Marker Placement Electronically Signed   By: Lillia Mountain M.D.   On: 06/16/2016 15:42   [place] Rt Breast Bx W Loc Dev 1st Lesion Img Bx Spec US Guide  Addendum Date: 06/21/2016   ADDENDUM REPORT: 06/21/2016 07:58 ADDENDUM: Pathology revealed grade II invasive lobular carcinoma and lobular carcinoma in situ in the right breast at 9:00, 6:30 and 6:00. This was found to be concordant by Dr. Lillia Mountain. Pathology results were discussed with the patient by telephone. The patient reported doing well after the biopsy. Post biopsy instructions and care were reviewed and questions were answered. The patient was encouraged to call [facility] for any additional concerns. The patient was referred to the [facility] at the [facility] on June 23, 2016. Pathology results reported by Susa Raring RN, BSN on 06/21/2016. Electronically Signed   By: Lillia Mountain M.D.   On: 06/21/2016 07:58   Result Date: 06/21/2016 CLINICAL DATA:  Strong family history of breast cancer. Recent screening MRI showed multiple enhancing masses in the right breast. EXAM: ULTRASOUND GUIDED RIGHT BREAST CORE NEEDLE BIOPSY COMPARISON:  Previous  exam(s). PROCEDURE: I met with the patient  and we discussed the procedure of ultrasound-guided biopsy, including benefits and alternatives. We discussed the high likelihood of a successful procedure. We discussed the risks of the procedure including infection, bleeding, tissue injury, clip migration, and inadequate sampling. Informed written consent was given. The usual time-out protocol was performed immediately prior to the procedure. Using sterile technique and 1% Lidocaine as local anesthetic, under direct ultrasound visualization, a 12 gauge vacuum-assisted device was used to perform biopsy of a mass in the 6 o'clock region of the right breastusing a lateral to medial approach. At the conclusion of the procedure, a heart shaped tissue marker clip was deployed into the biopsy cavity. Follow-up 2-view mammogram was performed and dictated separately. IMPRESSION: Ultrasound-guided biopsy of the 6 o'clock region of the right breast. No apparent complications. Electronically Signed: By: Lillia Mountain M.D. On: 06/16/2016 15:30   [place] Rt Breast Bx W Loc Dev Ea Add Lesion Img Bx Spec US Guide  Addendum Date: 06/21/2016   ADDENDUM REPORT: 06/21/2016 07:58 ADDENDUM: Pathology revealed grade II invasive lobular carcinoma and lobular carcinoma in situ in the right breast at 9:00, 6:30 and 6:00. This was found to be concordant by Dr. Lillia Mountain. Pathology results were discussed with the patient by telephone. The patient reported doing well after the biopsy. Post biopsy instructions and care were reviewed and questions were answered. The patient was encouraged to call [facility] for any additional concerns. The patient was referred to the [facility] at the [facility] on June 23, 2016. Pathology results reported by Susa Raring RN, BSN on 06/21/2016. Electronically Signed   By: Lillia Mountain M.D.   On: 06/21/2016 07:58   Result Date:  06/21/2016 CLINICAL DATA:  Recent MRI showed multiple enhancing masses in the right breast. Strong family history of breast cancer. EXAM: ULTRASOUND GUIDED RIGHT BREAST CORE NEEDLE BIOPSY COMPARISON:  Previous exam(s). PROCEDURE: I met with the patient and we discussed the procedure of ultrasound-guided biopsy, including benefits and alternatives. We discussed the high likelihood of a successful procedure. We discussed the risks of the procedure including infection, bleeding, tissue injury, clip migration, and inadequate sampling. Informed written consent was given. The usual time-out protocol was performed immediately prior to the procedure. Using sterile technique and 1% Lidocaine as local anesthetic, under direct ultrasound visualization, a 12 gauge vacuum-assisted device was used to perform biopsy of a mass in the 6:30 region of the right breastusing a lateral to medial approach. At the conclusion of the procedure, a coil shaped tissue marker clip was deployed into the biopsy cavity. Follow-up 2-view mammogram was performed and dictated separately. IMPRESSION: Ultrasound-guided biopsy of the 6:30 region of the right breast. No apparent complications. Electronically Signed: By: Lillia Mountain M.D. On: 06/16/2016 15:29   [place] Rt Breast Bx W Loc Dev Ea Add Lesion Img Bx Spec US Guide  Addendum Date: 06/21/2016   ADDENDUM REPORT: 06/21/2016 07:58 ADDENDUM: Pathology revealed grade II invasive lobular carcinoma and lobular carcinoma in situ in the right breast at 9:00, 6:30 and 6:00. This was found to be concordant by Dr. Lillia Mountain. Pathology results were discussed with the patient by telephone. The patient reported doing well after the biopsy. Post biopsy instructions and care were reviewed and questions were answered. The patient was encouraged to call [facility] for any additional concerns. The patient was referred to the [facility] at the [facility]  on June 23, 2016. Pathology results reported by Susa Raring RN, BSN on 06/21/2016. Electronically Signed   By: Lillia Mountain M.D.   On: 06/21/2016 07:58   Result Date: 06/21/2016 CLINICAL DATA:  Strong family history of breast cancer. Recent MRI showed suspicious enhancing masses in the right breast. EXAM: ULTRASOUND GUIDED RIGHT BREAST CORE NEEDLE BIOPSY COMPARISON:  Previous exam(s). PROCEDURE: I met with the patient and we discussed the procedure of ultrasound-guided biopsy, including benefits and alternatives. We discussed the high likelihood of a successful procedure. We discussed the risks of the procedure including infection, bleeding, tissue injury, clip migration, and inadequate sampling. Informed written consent was given. The usual time-out protocol was performed immediately prior to the procedure. Using sterile technique and 1% Lidocaine as local anesthetic, under direct ultrasound visualization, a 12 gauge vacuum-assisted device was used to perform biopsy of the mass in the 9 o'clock region the right breastusing an inferior to superior approach. At the conclusion of the procedure, a ribbon shaped tissue marker clip was deployed into the biopsy cavity. Follow-up 2-view mammogram was performed and dictated separately. IMPRESSION: Ultrasound-guided biopsy of the 9 o'clock region right breast. No apparent complications. Electronically Signed: By: Lillia Mountain M.D. On: 06/16/2016 15:27    ASSESSMENT & PLAN: 60 year old Caucasian postmenopausal woman, presented image discovered multifocal right breast masses.   1. Malignant neoplasm of lower-outer quadrant of right female breast, invasive lobular carcinoma and LCIS, [id number], stage IA, G2, ER+/PR+/HER2- ---We discussed her imaging findings and the biopsy results in great details. -Her breast MRI incidentally showed multiple small lesions in the liver, indeterminate, will order abdominal MRI with and without contrast for  evaluation. I think the suspicion for metastatic disease in her liver is not very high, given her early stage disease.  -Giving the multifocal disease, she likely need right mastectomy. She is agreeable with that. She was seen by Dr. [name] today and likely will proceed with surgery soon.  -I recommend a Oncotype Dx test on the surgical sample and we'll make a decision about adjuvant chemotherapy based on the Oncotype result. Written material of this test was given to her. She has good baseline health, would be a good candidate for chemotherapy if her Oncotype recurrence score is high. Giving her lobular histology and low Ki-67, I suspect this is likely a low-risk disease. -If her surgical sentinel lymph node node positive, I recommend mammaprint for further risk stratification and guide adjuvant chemotherapy. -Giving the strong ER and PR expression in her postmenopausal status, I recommend adjuvant endocrine therapy with aromatase inhibitor for a total of 5-10 years to reduce the risk of cancer recurrence. Potential benefits and side effects were discussed with patient and she is interested. Due to her lobular histology, I would recommend a total of 10 years if she is able tolerate. -She was also seen by radiation oncologist Dr. Camelia Eng today. If her surgical sentinel lymph nodes were negative, she would not need post mastectomy radiation.  -We also discussed the breast cancer surveillance after her surgery. She will continue annual screening mammogram, self exam, and a routine office visit with lab and exam with [place]. -I encouraged her to have healthy diet and exercise regularly.    2. Genetics -She has 2 family members who had breast cancer at a young age, she will qualify for genetic testing, we'll refer her.  3. DM, type 2 -She used to be on metformin, off now. Her random blood glucose was 253 today, I strongly encouraged her to  follow-up with her primary care physician, and consider restarting  metformin  4. Arthritis -She had a total right hip replacement, has mild arthralgia in the left hip, manageable.  Plan -Abdominal MRI with and without contrast -Genetic referral -Oncotype or mammaprint on her surgical sample -I'll plan to see her back 3 weeks after her surgery -She'll follow-up with her primary care physician for her hyperglycemia   Orders Placed This Encounter  Procedures  . MR Abdomen W Contrast    Standing Status:   Future    Standing Expiration Date:   06/23/2017    Order Specific Question:   If indicated for the ordered procedure, I authorize the administration of contrast media per Radiology protocol    Answer:   Yes    Order Specific Question:   Reason for Exam (SYMPTOM  OR DIAGNOSIS REQUIRED)    Answer:   new breast cancer with liver lesions seen on breast MRI    Order Specific Question:   Preferred imaging location?    Answer:   [facility] (table limit-350 lbs)    Order Specific Question:   What is the patient's sedation requirement?    Answer:   No Sedation    Order Specific Question:   Does the patient have a pacemaker or implanted devices?    Answer:   No    All questions were answered. The patient knows to call the clinic with any problems, questions or concerns. I spent 55 minutes counseling the patient face to face. The total time spent in the appointment was 60 minutes and more than 50% was on counseling.     [name], MD 06/23/2016 9:57 PM

## 2016-06-23 NOTE — Progress Notes (Signed)
Nutrition Assessment  Reason for Assessment:  Pt seen in Breast Clinic  ASSESSMENT: 60 y.o female with new diagnosis of right breast cancer ER +, P + and HER2 -.  History of DM, GERD, HTN  Pt reports normal appetite prior to visit today.  Medications:  metformin  Labs: glucose 253  Anthropometrics:  Ht: 5'3" Wt: 178 lbs, 12.8 oz. Reports no wt loss prior to admission BMI; 31.7  NUTRITION DIAGNOSIS: Food and Nutrition Related knowledge Deficit related to new diagnosis of breast cancer as evidenced by no prior need for nutrition related information.  INTERVENTION: Provided packet of information regarding managing treatment side effects, well-balanced plant based diet including whole grains and lean proteins, soy foods, organic foods, and common myths.  Answered pt and husband's questions regarding nutrition.  Provided business card for further questions or concerns.  Verbalized understanding.  MONITORING, EVALUATION, and GOAL: Pt will consume a healthy plant based diet to maintain lean body mass throughout treatment.   Marah Park B. Zenia Resides, [place], [place] (pager) Weekend/On-Call pager [phone])

## 2016-06-23 NOTE — Progress Notes (Signed)
Radiation Oncology         [phone] ________________________________  Initial outpatient Consultation  Name: Carolyn Glass MRN: [id number]  Date: 06/23/2016  DOB: 1956/02/12  EB:RAXENMM,HWKGSU [name], MD  [name], MD   REFERRING PHYSICIAN: [name], MD  DIAGNOSIS:    ICD-9-CM ICD-10-CM   1. Malignant neoplasm of lower-outer quadrant of right breast of female, estrogen receptor positive ([place]) 174.5 C50.511    V86.0 Z17.0    Stage IA Right multifocal [id number] Breast LOQ Invasive Lobular Carcinoma, ER+ / PR+ / Her2- Ki67 5-10%, Grade II  HISTORY OF PRESENT ILLNESS::Carolyn Glass is a 60 y.o. female who presented with four suspicious masses in the right breast in screening MRI. Also a left retroareolar mass, but that was negative in ultrasound. There were also non-specific lesions in liver MRI. The ultrasound revealed a 1.0 cm mass at the 6 o'clock position, a 1.4 cm mass at 6:30 position, and an 8 mm mass at the 9 o'clock position of the right breast. Note that there were two 7 mm masses in this 9:00 region reported on her MRI. Axilla was negative. The three lesions from the ultrasound were biopsied, all were demonstrative of grade II invasive lobular carcinoma ER +, PR +,  Her2-.   Her discussion at tumor board, it was recommended she undergo mastectomy and sentinel lymph node biopsy, oncotype testing, genetic testing due to family history, and a dedicated MRI of her liver.    PREVIOUS RADIATION THERAPY: No  PAST MEDICAL HISTORY:  has a past medical history of Arthritis; Asthma; Diabetes mellitus without complication ([place]); GERD (gastroesophageal reflux disease); Hypertension; Malignant neoplasm of lower-outer quadrant of right female breast ([place]) (06/21/2016); and Shortness of breath dyspnea.    PAST SURGICAL HISTORY: Past Surgical History:  Procedure Laterality Date  . BREAST LUMPECTOMY     right   . DILATION AND CURETTAGE OF UTERUS    . JOINT REPLACEMENT    .  TOTAL HIP ARTHROPLASTY Right 01/07/2016   Procedure: TOTAL HIP ARTHROPLASTY ANTERIOR APPROACH;  Surgeon: Gaynelle Arabian, MD;  Location: [facility];  Service: Orthopedics;  Laterality: Right;    FAMILY HISTORY: family history includes Arthritis in her father and mother; Asthma in her father and mother; Birth defects in her brother; Cancer in her maternal grandmother and maternal uncle; Diabetes in her mother; Early death (age of onset: 72) in her brother; Hyperlipidemia in her father and mother; Hypertension in her mother; Mental retardation in her maternal uncle; Stroke in her father.  SOCIAL HISTORY:  reports that she has never smoked. She has never used smokeless tobacco. She reports that she does not drink alcohol or use drugs.  ALLERGIES: Azithromycin; Biaxin [clarithromycin]; and Codeine  MEDICATIONS:  Current Outpatient Prescriptions  Medication Sig Dispense Refill  . Acetaminophen (TYLENOL ARTHRITIS PAIN PO) Take by mouth 2 (two) times daily.    Marland Kitchen albuterol (PROVENTIL HFA;VENTOLIN HFA) 108 (90 Base) MCG/ACT inhaler Inhale 2 puffs into the lungs every 6 (six) hours as needed for wheezing or shortness of breath.    . benazepril (LOTENSIN) 20 MG tablet Take 20 mg by mouth 2 (two) times daily.    Marland Kitchen escitalopram (LEXAPRO) 20 MG tablet Take 20 mg by mouth daily.    . fexofenadine (ALLEGRA) 180 MG tablet Take 180 mg by mouth daily.    . metFORMIN (GLUCOPHAGE) 500 MG tablet Take 500 mg by mouth 2 (two) times daily with a meal.    . omeprazole (PRILOSEC) 40 MG  capsule Take 40 mg by mouth daily.     No current facility-administered medications for this encounter.     REVIEW OF SYSTEMS:  Notable for that above. A 15 point review of systems was obtained. She was positive for arthritis, difficulties walking, and diabetes. All other systems were negative.   PHYSICAL EXAM:   Vitals with BMI 06/23/2016  Height 5' 3"   Weight 178 lbs 13 oz  BMI 93.7  Systolic 342  Diastolic 79  Pulse 81  Respirations  18    General: Alert and oriented, in no acute distress HEENT: Head is normocephalic. Extraocular movements are intact. Oropharynx is clear. Neck: Neck is supple, no palpable cervical or supraclavicular lymphadenopathy. Heart: Regular in rate and rhythm with no murmurs, rubs, or gallops. Chest: Clear to auscultation bilaterally, with no rhonchi, wheezes, or rales. Abdomen: Soft, nontender, nondistended, with no rigidity or guarding. Extremities: No cyanosis or edema. Lymphatics: see Neck Exam Skin: No concerning lesions. Ecchymosis over right breast from bx Musculoskeletal: symmetric strength and muscle tone throughout. Neurologic: Cranial nerves II through XII are grossly intact. No obvious focalities. Speech is fluent. Coordination is intact. Psychiatric: Judgment and insight are intact. Affect is appropriate. Breasts: No palpable masses in axillary regions bilaterally. No palpable masses in the left breast.  In the right breast there is post biopsy bruising in the 6-9 o'clock region. She has some swelling in the 6 o'clock region, most of which is post biopsy change. I am not able to appreciate a discreet mass in the 9 o'clock region. . No other palpable masses appreciated in the breasts or axillae.   ECOG = 0  0 - Asymptomatic (Fully active, able to carry on all predisease activities without restriction)  1 - Symptomatic but completely ambulatory (Restricted in physically strenuous activity but ambulatory and able to carry out work of a light or sedentary nature. For example, light housework, office work)  2 - Symptomatic, <50% in bed during the day (Ambulatory and capable of all self care but unable to carry out any work activities. Up and about more than 50% of waking hours)  3 - Symptomatic, >50% in bed, but not bedbound (Capable of only limited self-care, confined to bed or chair 50% or more of waking hours)  4 - Bedbound (Completely disabled. Cannot carry on any self-care. Totally  confined to bed or chair)  5 - Death   Eustace Pen MM, Creech RH, Tormey DC, et al. [phone]). "Toxicity and response criteria of the [facility]". [place] Oncol. 5 (6): 649-55   LABORATORY DATA:  Lab Results  Component Value Date   WBC 4.9 06/23/2016   HGB 12.8 06/23/2016   HCT 39.2 06/23/2016   MCV 94.2 06/23/2016   PLT 250 06/23/2016   CMP     Component Value Date/Time   NA 139 06/23/2016 0753   K 3.8 06/23/2016 0753   CL 104 05/11/2016 0940   CO2 25 06/23/2016 0753   GLUCOSE 253 (H) 06/23/2016 0753   BUN 12.3 06/23/2016 0753   CREATININE 0.8 06/23/2016 0753   CALCIUM 9.4 06/23/2016 0753   PROT 6.8 06/23/2016 0753   ALBUMIN 3.7 06/23/2016 0753   AST 18 06/23/2016 0753   ALT 19 06/23/2016 0753   ALKPHOS 110 06/23/2016 0753   BILITOT 0.59 06/23/2016 0753   GFRNONAA >89 05/11/2016 0940   GFRAA >89 05/11/2016 0940         RADIOGRAPHY: Mr Breast Bilateral W Wo Contrast  Result Date: 06/08/2016  CLINICAL DATA:  Patient for high risk screening MRI. Significant family history of breast cancer. History of benign right surgical biopsy. EXAM: BILATERAL BREAST MRI WITH AND WITHOUT CONTRAST TECHNIQUE: Multiplanar, multisequence MR images of both breasts were obtained prior to and following the intravenous administration of 16 ml of MultiHance. THREE-DIMENSIONAL MR IMAGE RENDERING ON INDEPENDENT WORKSTATION: Three-dimensional MR images were rendered by post-processing of the original MR data on an independent workstation. The three-dimensional MR images were interpreted, and findings are reported in the following complete MRI report for this study. Three dimensional images were evaluated at the independent DynaCad workstation COMPARISON:  Previous exam(s). FINDINGS: Breast composition: b.  Scattered fibroglandular tissue. Background parenchymal enhancement: Mild Right breast: Within the inferior aspect of the right breast centrally there is a 1.4 x 1.6 x 1.1 cm  irregular mass. Immediately adjacent (slightly anterior and lateral to this mass is an additional 1.4 cm irregular mass. In total these masses measure 2.4 x 1.1 x 1.4 cm. Within the lateral aspect of the right breast posteriorly there is an 8 mm enhancing mass with susceptibility artifact compatible with previously biopsied benign right fibroadenoma. Within the lateral right breast middle depth there is an indeterminate 7 mm irregular enhancing mass. Within the anterior lateral right breast there is an indeterminate 7 mm irregular enhancing mass. Left breast: Within the subareolar left breast there is a 9 x 8 x 6 mm oval circumscribed enhancing mass. No additional suspicious areas of enhancement identified within the left breast. Lymph nodes: No abnormal appearing lymph nodes. Ancillary findings: Multiple small enhancing lesions are demonstrated within the liver which are incompletely characterized. Reference 8 mm lesion (image 114; series 8). Additionally there are multiple T2 bright lesions within the liver which are incompletely characterized. IMPRESSION: Two adjacent irregular enhancing masses within the inferior aspect of the right breast concerning for the possibility of primary breast malignancy. Two additional indeterminate irregular enhancing masses within the lateral right breast. Indeterminate oval enhancing mass within the subareolar left breast. Indeterminate enhancing lesions within the liver. Indeterminate T2 bright lesions within the liver. RECOMMENDATION: Recommend second-look ultrasound and attempt to identify the irregular enhancing masses within the inferior aspect of the right breast and the two 7 mm enhancing masses within the lateral aspect of the right breast. Recommend second-look ultrasound in an attempt to identify the retroareolar left breast mass. Recommend biopsy of these masses by ultrasound if they are apparent sonographically. Recommend MRI guided core needle biopsy of any mass that  is unable to be identified or characterized sonographically. Recommend dedicated abdominal MRI for further evaluation of hepatic lesions. BI-RADS CATEGORY  4: Suspicious. Electronically Signed   By: Lovey Newcomer M.D.   On: 06/08/2016 12:11   US Breast Ltd Uni Left Inc Axilla  Result Date: 06/16/2016 CLINICAL DATA:  Strong family history of breast cancer. Recent screening MRI showed multiple enhancing masses in the right breast and a subareolar area of enhancement in the left breast. EXAM: ULTRASOUND OF THE BILATERAL BREAST COMPARISON:  Previous exam(s). FINDINGS: On physical exam, I do not palpate a mass in the lateral aspect of the right breast or 6 o'clock region of the right breast. I do not palpate a mass in the subareolar region of the left breast. Both nipples are symmetric. The left nipple is not inverted and there is no erythema or ulceration. Targeted ultrasound is performed, showing an irregular spiculated mass in the right breast at 6:30 4 cm from the nipple measuring 1.1 x 1.3 x 1.4  cm. Adjacent to this is an irregular spiculated mass at 6 o'clock 4 cm from the nipple measuring 0.9 x 1.0 x 1.0 cm. There is an irregular hypoechoic mass in the right breast at 9 o'clock 4 cm from the nipple measuring 7 x 8 x 4 mm. Sonographic evaluation of the right axilla does not show any enlarged adenopathy. Sonographic evaluation of the subareolar region of the left breast shows normal tissue. There is no suspicious mass, abnormal shadowing or distortion. The lesion seen on the MRI is thought to be the nipple. The nipple has a normal appearance clinically. IMPRESSION: Three suspicious masses in the right breast. RECOMMENDATION: Ultrasound-guided core biopsies of the masses in the 9 o'clock, 6:30 o'clock and 6 o'clock regions of the right breast recommended. This will be performed and dictated separately. I have discussed the findings and recommendations with the patient. Results were also provided in writing at the  conclusion of the visit. If applicable, a reminder letter will be sent to the patient regarding the next appointment. BI-RADS CATEGORY  5: Highly suggestive of malignancy - appropriate action should be taken. Electronically Signed   By: Lillia Mountain M.D.   On: 06/16/2016 15:25   US Breast Ltd Uni Right Inc Axilla  Result Date: 06/16/2016 CLINICAL DATA:  Strong family history of breast cancer. Recent screening MRI showed multiple enhancing masses in the right breast and a subareolar area of enhancement in the left breast. EXAM: ULTRASOUND OF THE BILATERAL BREAST COMPARISON:  Previous exam(s). FINDINGS: On physical exam, I do not palpate a mass in the lateral aspect of the right breast or 6 o'clock region of the right breast. I do not palpate a mass in the subareolar region of the left breast. Both nipples are symmetric. The left nipple is not inverted and there is no erythema or ulceration. Targeted ultrasound is performed, showing an irregular spiculated mass in the right breast at 6:30 4 cm from the nipple measuring 1.1 x 1.3 x 1.4 cm. Adjacent to this is an irregular spiculated mass at 6 o'clock 4 cm from the nipple measuring 0.9 x 1.0 x 1.0 cm. There is an irregular hypoechoic mass in the right breast at 9 o'clock 4 cm from the nipple measuring 7 x 8 x 4 mm. Sonographic evaluation of the right axilla does not show any enlarged adenopathy. Sonographic evaluation of the subareolar region of the left breast shows normal tissue. There is no suspicious mass, abnormal shadowing or distortion. The lesion seen on the MRI is thought to be the nipple. The nipple has a normal appearance clinically. IMPRESSION: Three suspicious masses in the right breast. RECOMMENDATION: Ultrasound-guided core biopsies of the masses in the 9 o'clock, 6:30 o'clock and 6 o'clock regions of the right breast recommended. This will be performed and dictated separately. I have discussed the findings and recommendations with the patient.  Results were also provided in writing at the conclusion of the visit. If applicable, a reminder letter will be sent to the patient regarding the next appointment. BI-RADS CATEGORY  5: Highly suggestive of malignancy - appropriate action should be taken. Electronically Signed   By: Lillia Mountain M.D.   On: 06/16/2016 15:25   Mm Diag Breast Tomo Uni Right  Result Date: 06/16/2016 CLINICAL DATA:  Recent MRI showed multiple enhancing lesions in the right breast. Strong family history of breast cancer. Status post 3 ultrasound-guided core biopsies. EXAM: 3D DIAGNOSTIC RIGHT MAMMOGRAM POST ULTRASOUND BIOPSY COMPARISON:  Previous exam(s). FINDINGS: 3D Mammographic images were  obtained following ultrasound guided biopsies of the right breast. Mammographic images show there is a heart shaped clip in the 6 o'clock region of the right breast, a coil shaped clip in the 6:30 region of the right breast and a ribbon shaped clip in the 9 o'clock region of the right breast. There is a wing shaped clip in the upper-outer quadrant of the right breast from a prior benign biopsy. IMPRESSION: Status post ultrasound-guided core biopsies of the right breast with pathology pending. Final Assessment: Post Procedure Mammograms for Marker Placement Electronically Signed   By: Lillia Mountain M.D.   On: 06/16/2016 15:42   [place] Rt Breast Bx W Loc Dev 1st Lesion Img Bx Spec US Guide  Addendum Date: 06/21/2016   ADDENDUM REPORT: 06/21/2016 07:58 ADDENDUM: Pathology revealed grade II invasive lobular carcinoma and lobular carcinoma in situ in the right breast at 9:00, 6:30 and 6:00. This was found to be concordant by Dr. Lillia Mountain. Pathology results were discussed with the patient by telephone. The patient reported doing well after the biopsy. Post biopsy instructions and care were reviewed and questions were answered. The patient was encouraged to call [facility] for any additional concerns. The patient was referred  to the [facility] at the [facility] on June 23, 2016. Pathology results reported by Susa Raring RN, BSN on 06/21/2016. Electronically Signed   By: Lillia Mountain M.D.   On: 06/21/2016 07:58   Result Date: 06/21/2016 CLINICAL DATA:  Strong family history of breast cancer. Recent screening MRI showed multiple enhancing masses in the right breast. EXAM: ULTRASOUND GUIDED RIGHT BREAST CORE NEEDLE BIOPSY COMPARISON:  Previous exam(s). PROCEDURE: I met with the patient and we discussed the procedure of ultrasound-guided biopsy, including benefits and alternatives. We discussed the high likelihood of a successful procedure. We discussed the risks of the procedure including infection, bleeding, tissue injury, clip migration, and inadequate sampling. Informed written consent was given. The usual time-out protocol was performed immediately prior to the procedure. Using sterile technique and 1% Lidocaine as local anesthetic, under direct ultrasound visualization, a 12 gauge vacuum-assisted device was used to perform biopsy of a mass in the 6 o'clock region of the right breastusing a lateral to medial approach. At the conclusion of the procedure, a heart shaped tissue marker clip was deployed into the biopsy cavity. Follow-up 2-view mammogram was performed and dictated separately. IMPRESSION: Ultrasound-guided biopsy of the 6 o'clock region of the right breast. No apparent complications. Electronically Signed: By: Lillia Mountain M.D. On: 06/16/2016 15:30   [place] Rt Breast Bx W Loc Dev Ea Add Lesion Img Bx Spec US Guide  Addendum Date: 06/21/2016   ADDENDUM REPORT: 06/21/2016 07:58 ADDENDUM: Pathology revealed grade II invasive lobular carcinoma and lobular carcinoma in situ in the right breast at 9:00, 6:30 and 6:00. This was found to be concordant by Dr. Lillia Mountain. Pathology results were discussed with the patient by telephone. The patient reported doing well after the  biopsy. Post biopsy instructions and care were reviewed and questions were answered. The patient was encouraged to call [facility] for any additional concerns. The patient was referred to the [facility] at the [facility] on June 23, 2016. Pathology results reported by Susa Raring RN, BSN on 06/21/2016. Electronically Signed   By: Lillia Mountain M.D.   On: 06/21/2016 07:58   Result Date: 06/21/2016 CLINICAL DATA:  Recent MRI showed multiple enhancing masses in the right breast. Strong family history of breast cancer. EXAM: ULTRASOUND GUIDED RIGHT BREAST CORE NEEDLE BIOPSY COMPARISON:  Previous exam(s). PROCEDURE: I met with the patient and we discussed the procedure of ultrasound-guided biopsy, including benefits and alternatives. We discussed the high likelihood of a successful procedure. We discussed the risks of the procedure including infection, bleeding, tissue injury, clip migration, and inadequate sampling. Informed written consent was given. The usual time-out protocol was performed immediately prior to the procedure. Using sterile technique and 1% Lidocaine as local anesthetic, under direct ultrasound visualization, a 12 gauge vacuum-assisted device was used to perform biopsy of a mass in the 6:30 region of the right breastusing a lateral to medial approach. At the conclusion of the procedure, a coil shaped tissue marker clip was deployed into the biopsy cavity. Follow-up 2-view mammogram was performed and dictated separately. IMPRESSION: Ultrasound-guided biopsy of the 6:30 region of the right breast. No apparent complications. Electronically Signed: By: Lillia Mountain M.D. On: 06/16/2016 15:29   [place] Rt Breast Bx W Loc Dev Ea Add Lesion Img Bx Spec US Guide  Addendum Date: 06/21/2016   ADDENDUM REPORT: 06/21/2016 07:58 ADDENDUM: Pathology revealed grade II invasive lobular carcinoma and lobular carcinoma in situ in the right  breast at 9:00, 6:30 and 6:00. This was found to be concordant by Dr. Lillia Mountain. Pathology results were discussed with the patient by telephone. The patient reported doing well after the biopsy. Post biopsy instructions and care were reviewed and questions were answered. The patient was encouraged to call [facility] for any additional concerns. The patient was referred to the [facility] at the [facility] on June 23, 2016. Pathology results reported by Susa Raring RN, BSN on 06/21/2016. Electronically Signed   By: Lillia Mountain M.D.   On: 06/21/2016 07:58   Result Date: 06/21/2016 CLINICAL DATA:  Strong family history of breast cancer. Recent MRI showed suspicious enhancing masses in the right breast. EXAM: ULTRASOUND GUIDED RIGHT BREAST CORE NEEDLE BIOPSY COMPARISON:  Previous exam(s). PROCEDURE: I met with the patient and we discussed the procedure of ultrasound-guided biopsy, including benefits and alternatives. We discussed the high likelihood of a successful procedure. We discussed the risks of the procedure including infection, bleeding, tissue injury, clip migration, and inadequate sampling. Informed written consent was given. The usual time-out protocol was performed immediately prior to the procedure. Using sterile technique and 1% Lidocaine as local anesthetic, under direct ultrasound visualization, a 12 gauge vacuum-assisted device was used to perform biopsy of the mass in the 9 o'clock region the right breastusing an inferior to superior approach. At the conclusion of the procedure, a ribbon shaped tissue marker clip was deployed into the biopsy cavity. Follow-up 2-view mammogram was performed and dictated separately. IMPRESSION: Ultrasound-guided biopsy of the 9 o'clock region right breast. No apparent complications. Electronically Signed: By: Lillia Mountain M.D. On: 06/16/2016 15:27      IMPRESSION/PLAN:  Multifocal T1c  N0 Mx, likely stage I A right breast cancer, liver MRI pending   It was a pleasure meeting the patient today. We discussed the risks, benefits, and side effects of radiotherapy. We discussed that given her clinical node negativity, it is unlikely that she will need post mastectomy radiotherapy. I think it is reasonable to under go immediate reconstruction if she is interested. We discussed the indication for post mastectomy radiation in case she warrants a referral back to  me, such as node positivity or positive margins .   __________________________________________   [name], MD

## 2016-06-24 ENCOUNTER — Ambulatory Visit (INDEPENDENT_AMBULATORY_CARE_PROVIDER_SITE_OTHER): Payer: BC Managed Care – PPO | Admitting: Family Medicine

## 2016-06-24 ENCOUNTER — Encounter: Payer: Self-pay | Admitting: Family Medicine

## 2016-06-24 VITALS — BP 162/90 | HR 84 | Temp 98.8°F | Resp 16 | Wt 179.5 lb

## 2016-06-24 DIAGNOSIS — R739 Hyperglycemia, unspecified: Secondary | ICD-10-CM | POA: Diagnosis not present

## 2016-06-24 MED ORDER — METFORMIN HCL 500 MG PO TABS: 500.0000 mg | ORAL_TABLET | Freq: Two times a day (BID) | ORAL | 5 refills | Status: DC

## 2016-06-24 MED ORDER — ESCITALOPRAM OXALATE 20 MG PO TABS: 20.0000 mg | ORAL_TABLET | Freq: Every day | ORAL | 11 refills | Status: DC

## 2016-06-24 NOTE — Progress Notes (Signed)
Subjective:    Patient ID: Carolyn Glass, female    DOB: 1956-02-27, 60 y.o.   MRN: [id number]  HPI  05/06/16 Patient is a very pleasant 60 year old white female here today to establish care. Her mammogram is up-to-date. It is been 10 years since her last colonoscopy. She already has an appointment to see her gastroenterologist at the first of next year to have this performed. She sees a gynecologist for her Pap smears. She has had Prevnar 13. She is due for the shingles vaccine as well as Pneumovax 23. We discussed these today but she defers them. Past medical history is significant for borderline type 2 diabetes mellitus for which she takes metformin. She also has hypertension. In the past she's had a history of anxiety for which she takes Lexapro 20 mg a day. 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 Otherwise she is doing well with no concerns. She recently had a right-sided total hip replacement. She also has severe osteoarthritis in her left hip but she is deferring surgery at the present time on this. She also has a history of acid reflux for which she takes omeprazole. However the majority of her symptoms began after she started taking NSAIDs for her hip pain.  At that time, my plan was: Her blood pressure today is well controlled. She is requesting assistance with weight loss. She would like to come off metformin area and I recommended that she return fasting for hemoglobin A1c area and hemoglobin A1c reveals that she needs to continue some type of medication, I would recommend discontinuing metformin and possibly switching to Trulicity to help manage her diabetes and also assist in weight loss. I will also like to check a fasting lipid panel. Goal LDL cholesterol is less than 100. I would also like to check a urine microalbumin. She is due for hepatitis C screening. I recommended a shingles vaccine as well as Pneumovax 23  but she declined both. Her cancer screening is up-to-date. Her bone density is up-to-date.  06/24/16 Hemoglobin A1c was 6.7. Patient independently decided to discontinue metformin and replaced it with no other medication. Shortly thereafter she was diagnosed with breast cancer in her right breast. During the process of preoperative evaluation, she was found to have a random blood sugar of 253 and was referred back to me. Her hemoglobin A1c was 6.7 in early September. Shortly thereafter she stopped the metformin. Prior to stopping the metformin it was working well. We had a 20 minute discussion today involving the medication options that she has. Past Medical History:  Diagnosis Date  . Arthritis   . Asthma    allergy and exercise induced asthma   . Diabetes mellitus without complication ([place])   . GERD (gastroesophageal reflux disease)   . Hypertension   . Malignant neoplasm of lower-outer quadrant of right female breast ([place]) 06/21/2016  . Shortness of breath dyspnea    with exertion    Past Surgical History:  Procedure Laterality Date  . BREAST LUMPECTOMY     right   . DILATION AND CURETTAGE OF UTERUS    . JOINT REPLACEMENT    . TOTAL HIP ARTHROPLASTY Right 01/07/2016   Procedure: TOTAL HIP ARTHROPLASTY ANTERIOR APPROACH;  Surgeon: Gaynelle Arabian, MD;  Location: [facility];  Service: Orthopedics;  Laterality: Right;   Current Outpatient Prescriptions on File Prior to Visit  Medication Sig Dispense Refill  . Acetaminophen (TYLENOL ARTHRITIS PAIN  PO) Take by mouth 2 (two) times daily.    Marland Kitchen albuterol (PROVENTIL HFA;VENTOLIN HFA) 108 (90 Base) MCG/ACT inhaler Inhale 2 puffs into the lungs every 6 (six) hours as needed for wheezing or shortness of breath.    . benazepril (LOTENSIN) 20 MG tablet Take 20 mg by mouth 2 (two) times daily.    . fexofenadine (ALLEGRA) 180 MG tablet Take 180 mg by mouth daily.    Marland Kitchen omeprazole (PRILOSEC) 40 MG capsule Take 40 mg by mouth daily.     No current  facility-administered medications on file prior to visit.    Allergies  Allergen Reactions  . Azithromycin Itching and Rash  . Biaxin [Clarithromycin] Rash  . Codeine Rash    Thinks a codeine cough syrup caused rash.   Social History   Social History  . Marital status: Married    Spouse name: N/A  . Number of children: N/A  . Years of education: N/A   Occupational History  . Not on file.   Social History Main Topics  . Smoking status: Never Smoker  . Smokeless tobacco: Never Used  . Alcohol use No  . Drug use: No  . Sexual activity: Yes   Other Topics Concern  . Not on file   Social History Narrative  . No narrative on file   Family History  Problem Relation Age of Onset  . Arthritis Mother   . Asthma Mother   . Diabetes Mother   . Hyperlipidemia Mother   . Hypertension Mother   . Arthritis Father   . Asthma Father   . Stroke Father   . Hyperlipidemia Father   . Early death Brother 8    Muscular Dystrophy  . Birth defects Brother   . Cancer Maternal Uncle   . Mental retardation Maternal Uncle   . Cancer Maternal Grandmother     breast, mets to bone  . Cancer Paternal Grandmother 15    breast cancer   . Cancer Cousin 48    breast cancer       Review of Systems  All other systems reviewed and are negative.      Objective:   Physical Exam  Constitutional: She is oriented to person, place, and time. She appears well-developed and well-nourished. No distress.  HENT:  Head: Normocephalic and atraumatic.  Right Ear: External ear normal.  Left Ear: External ear normal.  Nose: Nose normal.  Mouth/Throat: Oropharynx is clear and moist. No oropharyngeal exudate.  Eyes: Conjunctivae and EOM are normal. Pupils are equal, round, and reactive to light. Right eye exhibits no discharge. Left eye exhibits no discharge. No scleral icterus.  Neck: Normal range of motion. Neck supple. No JVD present. No tracheal deviation present. No thyromegaly present.    Cardiovascular: Normal rate, regular rhythm, normal heart sounds and intact distal pulses.  Exam reveals no gallop and no friction rub.   No murmur heard. Pulmonary/Chest: Effort normal and breath sounds normal. No stridor. No respiratory distress. She has no wheezes. She has no rales. She exhibits no tenderness.  Abdominal: Soft. Bowel sounds are normal. She exhibits no distension and no mass. There is no tenderness. There is no rebound and no guarding.  Musculoskeletal: Normal range of motion. She exhibits no edema, tenderness or deformity.  Lymphadenopathy:    She has no cervical adenopathy.  Neurological: She is alert and oriented to person, place, and time. She has normal reflexes. No cranial nerve deficit. She exhibits normal muscle tone. Coordination normal.  Skin:  Skin is warm. No rash noted. She is not diaphoretic. No erythema. No pallor.  Psychiatric: She has a normal mood and affect. Her behavior is normal. Judgment and thought content normal.  Vitals reviewed.         Assessment & Plan:  Hyperglycemia The patient elects to resume her metformin 500 mg by mouth twice a day and recheck blood sugars in 6 weeks.

## 2016-06-25 ENCOUNTER — Ambulatory Visit (HOSPITAL_COMMUNITY): Payer: BC Managed Care – PPO

## 2016-06-25 ENCOUNTER — Other Ambulatory Visit: Payer: Self-pay | Admitting: Hematology

## 2016-06-28 ENCOUNTER — Ambulatory Visit (HOSPITAL_COMMUNITY)
Admission: RE | Admit: 2016-06-28 | Discharge: 2016-06-28 | Disposition: A | Discharge status: Home / Self Care | Payer: BC Managed Care – PPO | Source: Ambulatory Visit | Attending: Hematology | Admitting: Hematology

## 2016-06-28 ENCOUNTER — Telehealth: Payer: Self-pay | Admitting: *Deleted

## 2016-06-28 ENCOUNTER — Encounter: Payer: Self-pay | Admitting: Genetic Counselor

## 2016-06-28 ENCOUNTER — Other Ambulatory Visit: Payer: BC Managed Care – PPO

## 2016-06-28 ENCOUNTER — Ambulatory Visit (HOSPITAL_BASED_OUTPATIENT_CLINIC_OR_DEPARTMENT_OTHER): Payer: BC Managed Care – PPO | Admitting: Genetic Counselor

## 2016-06-28 DIAGNOSIS — Z803 Family history of malignant neoplasm of breast: Secondary | ICD-10-CM

## 2016-06-28 DIAGNOSIS — C50511 Malignant neoplasm of lower-outer quadrant of right female breast: Secondary | ICD-10-CM

## 2016-06-28 DIAGNOSIS — Z170 Estrogen receptor positive status [ER+]: Secondary | ICD-10-CM

## 2016-06-28 DIAGNOSIS — K760 Fatty (change of) liver, not elsewhere classified: Secondary | ICD-10-CM | POA: Insufficient documentation

## 2016-06-28 DIAGNOSIS — Z800 Family history of malignant neoplasm of digestive organs: Secondary | ICD-10-CM

## 2016-06-28 DIAGNOSIS — Z7183 Encounter for nonprocreative genetic counseling: Secondary | ICD-10-CM

## 2016-06-28 DIAGNOSIS — D1803 Hemangioma of intra-abdominal structures: Secondary | ICD-10-CM | POA: Insufficient documentation

## 2016-06-28 MED ORDER — GADOBENATE DIMEGLUMINE 529 MG/ML IV SOLN
20.0000 mL | Freq: Once | INTRAVENOUS | Status: AC | PRN
Start: 2016-06-28 — End: 2016-06-28
  Administered 2016-06-28: 16 mL via INTRAVENOUS

## 2016-06-28 NOTE — Telephone Encounter (Signed)
  Oncology Nurse Navigator Documentation  Navigator Location: [facility] (06/28/16 1400)   )Navigator Encounter Type: Telephone (06/28/16 1400) Telephone: Outgoing Call;Clinic/MDC Follow-up (06/28/16 1400)           Plastic Surgery Consult Date: 06/24/16 (06/28/16 1400)                                      Time Spent with Patient: 15 (06/28/16 1400)

## 2016-06-28 NOTE — Progress Notes (Signed)
REFERRING PROVIDER: [name], MD [address] [place], [place] [address]   [name], MD  PRIMARY PROVIDER:  Odette Fraction, MD  PRIMARY REASON FOR VISIT:  1. Malignant neoplasm of lower-outer quadrant of right breast of female, estrogen receptor positive ([place])   2. Family history of breast cancer   3. Family history of colon cancer   4. Family history of stomach cancer      HISTORY OF PRESENT ILLNESS:   Carolyn Glass, a 60 y.o. female, was seen for a [facility] cancer genetics consultation at the request of Dr. Dennard Schaumann due to a personal and family history of cancer.  Carolyn Glass presents to clinic today to discuss the possibility of a hereditary predisposition to cancer, genetic testing, and to further clarify her future cancer risks, as well as potential cancer risks for family members.   In October 2017, at the age of 28, Carolyn Glass was diagnosed with invasive ductal carcinoma of the right breast.  The tumor is ER+/PR+/Her2-. This will be treated with single mastectomy and radiation.  Genetics is being drawn to determine whether she should consider a double mastectomy.   CANCER HISTORY:  Oncology History   Malignant neoplasm of lower-outer quadrant of right female breast [facility])   Staging form: Breast, AJCC 7th Edition   - Clinical stage from 06/23/2016: Stage IA (T1c, N0, M0) - Unsigned      Malignant neoplasm of lower-outer quadrant of right female breast ([place])   03/01/2016 Mammogram    Bilateral screening mammogram was negative      06/08/2016 Imaging    Bilateral breast MRI with and without contrast showed 4 lesions in the right breast, 2 adjacent 1.4 cm mass in the inferior aspect of the right breast, and in the right lateral aspect of the breast, there are 8 mm and a 7 mm irregular enhancing mass. Multiple small enhancing lesions within the liver, indeterminate. No axillary adenopathy.      06/16/2016 Initial Diagnosis    Malignant neoplasm of lower-outer  quadrant of right female breast ([place])      06/16/2016 Initial Biopsy    Right breast needle core biopsy at 9 clock, 6:30 clock, 6 clock showed invasive bladder carcinoma, and LCIS      06/16/2016 Receptors her2    two right breast biopsy showed ER strongly positive, PR strongly positive, HER-2 negative, Ki-67 5-10%       06/16/2016 Imaging    Right breast and axilla ultrasound showed a 1.4 cm mass at the 6:30 o'clock, 1.0 cm mass at the 6:00, and 43m mass at 9:00 position. Axilla was negative.        HORMONAL RISK FACTORS:  Menarche was at age [age]  First live birth at age 60  OCP use for approximately 7 years.  Ovaries intact: yes.  Hysterectomy: no.  Menopausal status: postmenopausal.  HRT use: 0 years. Colonoscopy: yes; normal. Mammogram within the last year: yes. Number of breast biopsies: 1. Up to date with pelvic exams:  yes. Any excessive radiation exposure in the past:  no  Past Medical History:  Diagnosis Date  . Arthritis   . Asthma    allergy and exercise induced asthma   . Diabetes mellitus without complication ([place]   . Family history of breast cancer   . Family history of colon cancer   . Family history of stomach cancer   . GERD (gastroesophageal reflux disease)   . Hypertension   . Malignant neoplasm of  lower-outer quadrant of right female breast ([place]) 06/21/2016  . Shortness of breath dyspnea    with exertion     Past Surgical History:  Procedure Laterality Date  . BREAST LUMPECTOMY     right   . DILATION AND CURETTAGE OF UTERUS    . JOINT REPLACEMENT    . TOTAL HIP ARTHROPLASTY Right 01/07/2016   Procedure: TOTAL HIP ARTHROPLASTY ANTERIOR APPROACH;  Surgeon: Gaynelle Arabian, MD;  Location: [facility];  Service: Orthopedics;  Laterality: Right;    Social History   Social History  . Marital status: Married    Spouse name: N/A  . Number of children: 1  . Years of education: N/A   Social History Main Topics  . Smoking status: Never Smoker   . Smokeless tobacco: Never Used  . Alcohol use No  . Drug use: No  . Sexual activity: Yes   Other Topics Concern  . None   Social History Narrative  . None     FAMILY HISTORY:  We obtained a detailed, 4-generation family history.  Significant diagnoses are listed below: Family History  Problem Relation Age of Onset  . Arthritis Mother   . Asthma Mother   . Diabetes Mother   . Hyperlipidemia Mother   . Hypertension Mother   . Arthritis Father   . Asthma Father   . Stroke Father   . Hyperlipidemia Father   . Early death Brother 8    Muscular Dystrophy  . Birth defects Brother   . Cancer Maternal Uncle     possibly stomach  . Mental retardation Maternal Uncle   . Cancer Paternal Grandmother 40    breast cancer   . Cancer Cousin 51    paternal cousin's daughter with breast cancer  . Colon cancer Maternal Aunt     dx in her 23s  . Leukemia Paternal Uncle   . Lymphoma Paternal Uncle   . Down syndrome Paternal Uncle     The patient has one daughter who is cancer free. She has three brothers, one who died at 45 from muscular dystrophy.  The patient's mother is alive.  She has two sisters and a brother.  One sister had colon cancer in her 41's and her brother had possibly stomach cancer in his 41's.  The patient's father is alive at 49.  He had 7 brothers.  One brother had leukemia and another with Hodgkin's Lymphoma.  One of his brothers had a child who's daughter had breast cancer in her 35's. The patient's paternal grandmother had breast cancer at age 10.   Patient's maternal ancestors are of [place] and [place] descent, and paternal ancestors are of Caucasian descent. There is no reported Ashkenazi Jewish ancestry. There is no known consanguinity.  GENETIC COUNSELING ASSESSMENT: Carolyn Glass is a 60 y.o. female with a personal history of breast cancer and family history of breast, colon and stomach cancer which is somewhat suggestive of a hereditary cancer syndrome and  predisposition to cancer. We, therefore, discussed and recommended the following at today's visit.   DISCUSSION: We discussed that about 5-10% of breast cancer is hereditary with most cases due to BRCA mutations.  She has a limited family history on her paternal side with her father having 6 brothers.  We discussed that there are other genes outside of BRCA 1 and 2 that are associated with an increased risk for breast cancer including PALB2, ATM and CHEK2.  We reviewed the characteristics, features and inheritance patterns of hereditary cancer  syndromes. We also discussed genetic testing, including the appropriate family members to test, the process of testing, insurance coverage and turn-around-time for results. We discussed the implications of a negative, positive and/or variant of uncertain significant result. We recommended Carolyn Glass pursue genetic testing for the Comprehensive cancer gene panel. The Comprehensive Cancer Panel offered by [facility] includes sequencing and/or deletion duplication testing of the following 32 genes: APC, ATM, AXIN2, BARD1, BMPR1A, BRCA1, BRCA2, BRIP1, CDH1, CDK4, CDKN2A, CHEK2, EPCAM, FANCC, MLH1, MSH2, MSH6, MUTYH, NBN, PALB2, PMS2, POLD1, POLE, PTEN, RAD51C, RAD51D, SCG5/GREM1, SMAD4, STK11, TP53, VHL, and XRCC2.     Based on Ms. Ferdinand's personal and family history of cancer, she meets medical criteria for genetic testing. Despite that she meets criteria, she may still have an out of pocket cost. We discussed that if her out of pocket cost for testing is over $100, the laboratory will call and confirm whether she wants to proceed with testing.  If the out of pocket cost of testing is less than $100 she will be billed by the genetic testing laboratory.   PLAN: After considering the risks, benefits, and limitations, Carolyn Glass  provided informed consent to pursue genetic testing and the blood sample was sent to [facility] for analysis of the Comprehensive Cancer Panel.  We placed a RUSH on the results, so the results should be available within approximately 2-3 weeks' time, at which point they will be disclosed by telephone to Carolyn Glass, as will any additional recommendations warranted by these results. Carolyn Glass will receive a summary of her genetic counseling visit and a copy of her results once available. This information will also be available in Epic. We encouraged Carolyn Glass to remain in contact with cancer genetics annually so that we can continuously update the family history and inform her of any changes in cancer genetics and testing that may be of benefit for her family. Carolyn Glass questions were answered to her satisfaction today. Our contact information was provided should additional questions or concerns arise.  Lastly, we encouraged Carolyn Glass to remain in contact with cancer genetics annually so that we can continuously update the family history and inform her of any changes in cancer genetics and testing that may be of benefit for this family.   Ms.  Glass questions were answered to her satisfaction today. Our contact information was provided should additional questions or concerns arise. Thank you for the referral and allowing [place] to share in the care of your patient.   Carolyn Glass P. Florene Glen, [place], [facility] Certified Genetic Counselor [email or web address] phone: [phone]  The patient was seen for a total of 45 minutes in face-to-face genetic counseling.  This patient was discussed with Drs. Magrinat, Lindi Adie and/or Burr Medico who agrees with the above.    _______________________________________________________________________ For Office Staff:  Number of people involved in session: 1 Was an Intern/ student involved with case: yes Lady Deutscher

## 2016-07-09 ENCOUNTER — Encounter: Payer: Self-pay | Admitting: Genetic Counselor

## 2016-07-09 ENCOUNTER — Telehealth: Payer: Self-pay | Admitting: Genetic Counselor

## 2016-07-09 DIAGNOSIS — Z1379 Encounter for other screening for genetic and chromosomal anomalies: Secondary | ICD-10-CM | POA: Insufficient documentation

## 2016-07-09 NOTE — Telephone Encounter (Signed)
LM on VM with good news on test results.  Asked that she CB. 

## 2016-07-13 ENCOUNTER — Encounter: Payer: Self-pay | Admitting: Genetic Counselor

## 2016-07-15 ENCOUNTER — Ambulatory Visit: Payer: Self-pay | Admitting: General Surgery

## 2016-07-15 DIAGNOSIS — C50811 Malignant neoplasm of overlapping sites of right female breast: Secondary | ICD-10-CM

## 2016-07-23 ENCOUNTER — Ambulatory Visit: Payer: Self-pay | Admitting: General Surgery

## 2016-07-27 ENCOUNTER — Other Ambulatory Visit: Payer: Self-pay | Admitting: Family Medicine

## 2016-07-30 ENCOUNTER — Telehealth: Payer: Self-pay | Admitting: Hematology

## 2016-07-30 NOTE — Telephone Encounter (Signed)
lvm to inform pt of 09/10/16 appt date/time per LOS °

## 2016-08-04 NOTE — H&P (Signed)
Subjective:     Patient ID: Carolyn Glass is a 60 y.o. female.  HPI  Here for follow up discussion breast reconstruction prior to planned bilateral mastectomies. Presented following normal screening MMG 02/2016 and then screening MRI 06/2016 due to family history. MRI showed right breast 1.4 x 1.6 x 1.1 cm irregular mass. Anterior and lateral to this mass was an additional 1.4 cm irregular mass, total span 2.4 cm. In that lateral right breast 8 mm enhancing mass compatible with previously biopsied benign right fibroadenoma.  Additionaly in the lateral right breast middle depth there is an indeterminate 7 mm irregular enhancing mass.Within the anterior lateral right breast there is an indeterminate 7 mm irregular enhancing mass.  In the LEFT breast subareolar  9 x 8 x 6 mm mass noted.  Subsuquent [place] was normal on LEFT. On right three biopsies labeled 9, 6:30 and 6 oclock  all demonstrated ILC/LCIS, ER/PR +, Her2-. Axilla was negative. Given span of disease, mastectomy recommended. Oncotype planned. Patient has elected for bilateral procedure.  Genetics negative. Current 38 C, happy with this. 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 Husband drives trucks.  PMH significant for DM, last HbA1c 6.7 9/17 Notes she went on metformin prior to THA, was diet controlled throughout life and has gone off Metformin and lost 5 lbs.  Review of Systems     Objective:   Physical Exam  Cardiovascular: Normal rate, regular rhythm and normal heart sounds.   Pulmonary/Chest: Effort normal and breath sounds normal.  Abdominal: Soft.    Grade 2 ptosis, right periareolar scar   SN to nipple R 27 L 27 BW R 18 L 18 (chest wall BW 13 cm) Nipple to IMF R 9.5 L 10.5 cm  Assessment:     Right ILC LOQ    Plan:     Plan bilateral SSM with tissue expander possible acellular dermis reconstruction.Discussed may have Wise pattern skin scar or transverse scar mid breast Reviewed use  of ADM in breast reconstruction, cadaveric source, and can act as additional nidus for infection if experiences infection or seroma as will take weeks to incorporate.  Reviewed incisions, drains, OR length, hospital stay and recovery. Discussed process of expansion and implant based risks including rupture, MRI surveillance for silicone implants, infection requiring surgery or removal, contracture. Discussed future surgery dependent on adjuvant treatments.  Reviewed post procedure limitations inc compression, drain care, prescriptions.   Reviewed reconstruction will be asensate. Reviewedrisks mastectomy flap necrosis requiring additional surgery. Additional risks including but not limited to anesthesia, wound healing problems, damage to deeper structures, need for additional procedures, DVT/PE, cardiopulmonary complications, unacceptable cosmetic result, hematoma, seroma, fat necrosis reviewed.  Irene Limbo, MD [facility] [phone], pin [phone]

## 2016-08-10 ENCOUNTER — Encounter (HOSPITAL_BASED_OUTPATIENT_CLINIC_OR_DEPARTMENT_OTHER): Payer: Self-pay | Admitting: *Deleted

## 2016-08-12 ENCOUNTER — Encounter (HOSPITAL_BASED_OUTPATIENT_CLINIC_OR_DEPARTMENT_OTHER)
Admission: RE | Admit: 2016-08-12 | Discharge: 2016-08-12 | Disposition: A | Discharge status: Home / Self Care | Payer: BC Managed Care – PPO | Source: Ambulatory Visit | Attending: General Surgery | Admitting: General Surgery

## 2016-08-12 DIAGNOSIS — N6022 Fibroadenosis of left breast: Secondary | ICD-10-CM | POA: Diagnosis not present

## 2016-08-12 DIAGNOSIS — J45909 Unspecified asthma, uncomplicated: Secondary | ICD-10-CM | POA: Diagnosis not present

## 2016-08-12 DIAGNOSIS — K219 Gastro-esophageal reflux disease without esophagitis: Secondary | ICD-10-CM | POA: Diagnosis not present

## 2016-08-12 DIAGNOSIS — Z881 Allergy status to other antibiotic agents status: Secondary | ICD-10-CM | POA: Diagnosis not present

## 2016-08-12 DIAGNOSIS — I10 Essential (primary) hypertension: Secondary | ICD-10-CM | POA: Diagnosis not present

## 2016-08-12 DIAGNOSIS — Z885 Allergy status to narcotic agent status: Secondary | ICD-10-CM | POA: Diagnosis not present

## 2016-08-12 DIAGNOSIS — M1990 Unspecified osteoarthritis, unspecified site: Secondary | ICD-10-CM | POA: Diagnosis not present

## 2016-08-12 DIAGNOSIS — Z7984 Long term (current) use of oral hypoglycemic drugs: Secondary | ICD-10-CM | POA: Diagnosis not present

## 2016-08-12 DIAGNOSIS — F419 Anxiety disorder, unspecified: Secondary | ICD-10-CM | POA: Diagnosis not present

## 2016-08-12 DIAGNOSIS — E119 Type 2 diabetes mellitus without complications: Secondary | ICD-10-CM | POA: Diagnosis not present

## 2016-08-12 DIAGNOSIS — N6012 Diffuse cystic mastopathy of left breast: Secondary | ICD-10-CM | POA: Diagnosis not present

## 2016-08-12 DIAGNOSIS — Z170 Estrogen receptor positive status [ER+]: Secondary | ICD-10-CM | POA: Diagnosis not present

## 2016-08-12 DIAGNOSIS — C50511 Malignant neoplasm of lower-outer quadrant of right female breast: Secondary | ICD-10-CM | POA: Diagnosis not present

## 2016-08-12 DIAGNOSIS — D0502 Lobular carcinoma in situ of left breast: Secondary | ICD-10-CM | POA: Diagnosis not present

## 2016-08-12 DIAGNOSIS — Z888 Allergy status to other drugs, medicaments and biological substances status: Secondary | ICD-10-CM | POA: Diagnosis not present

## 2016-08-12 LAB — BASIC METABOLIC PANEL
Anion gap: 7 (ref 5–15)
BUN: 10 mg/dL (ref 6–20)
CALCIUM: 9.3 mg/dL (ref 8.9–10.3)
CHLORIDE: 103 mmol/L (ref 101–111)
CO2: 26 mmol/L (ref 22–32)
CREATININE: 0.7 mg/dL (ref 0.44–1.00)
GFR calc non Af Amer: >60 mL/min (ref >60)
Glucose, Bld: 96 mg/dL (ref 65–99)
Potassium: 3.9 mmol/L (ref 3.5–5.1)
SODIUM: 136 mmol/L (ref 135–145)

## 2016-08-12 NOTE — Progress Notes (Signed)
Pt instructed to drink H2O before 0730 day of surgery.

## 2016-08-12 NOTE — H&P (Signed)
History of Present Illness Carolyn Glass Carolyn Glass; 08/12/2016 1:45 PM) The patient is a 60 year old female who presents with breast cancer. She returns for a preop visit prior to planned bilateral total mastectomy with reconstruction including right axillary sentinel lymph node biopsy.  Her original presentation was as follows: She is a 60 YO post menopausal female referred by Dr. Miquel Glass for evaluation of recently diagnosed carcinoma of the right breast. she previously has had benign biopsies on the right side. mammogram was negative this summer. Breast MRI was recommended by Dr. Radene Glass due to her family history and personal history of breast problems. she has had a paternal grandmother and an aunt with breast cancer. Subsequent imaging included bilateral breast MRI revealing 2 separate masses in the inferior breast measuring 1.4 cm and 1.4 cm adjacent to each other. also noted in the right breast were an 8 mm enhancing mass laterally and a separate 7 mm enhancing mass laterally more anteriorly and a 7 mm irregular enhancing mass in the anterior lateral right breast. Left side showed enhancement subareolar which on subsequent ultrasound was just felt to be nipple enhancement. Several small indeterminate liver masses were also noted. Subsequent targeted ultrasound showed 2 spiculated masses in the 6:30 position measuring 1.4 cm and 1 cm as well as a hypoechoic mass in the lateral right breast at 9:00 measuring 7 mm. Axilla appeared normal. An ultrasound guided breast biopsy was performed on 06/16/2016 with pathology revealing invasive lobular carcinoma of the breast in all 3 areas. She is seen now in breast multidisciplinary clinic for initial treatment planning. She has experienced no breast symptoms, specifically lump or skin change, nipple discharge or pain.  Findings at that time were the following: Tumor size: 3 masses measuring from 1.4-0.7 cm Tumor grade: 2, Ki-67 5-10% Estrogen Receptor:  positive Progesterone Receptor: positive Her-2 neu: negative Lymph node status: negative  Subsequently genetic testing was negative. She has seen last surgery and has elected bilateral mastectomy with immediate reconstruction.    Problem List/Past Medical Carolyn Glass; 08/12/2016 1:45 PM) MALIGNANT NEOPLASM OF RIGHT BREAST, STAGE 1, ESTROGEN RECEPTOR POSITIVE (C50.911)  Other Problems Carolyn Glass; 08/12/2016 1:45 PM) Gastroesophageal Reflux Disease Breast Cancer High blood pressure Lump In Breast Anxiety Disorder Asthma Arthritis  Past Surgical History Carolyn Glass; 08/12/2016 1:45 PM) Oral Surgery Hip Surgery Right. Breast Biopsy Right. multiple  Diagnostic Studies History Carolyn Glass; 08/12/2016 1:45 PM) Mammogram within last year Colonoscopy 5-10 years ago Pap Smear 1-5 years ago  Allergies Carolyn Glass, Carolyn Glass; 08/12/2016 1:06 PM) Azithromycin *CHEMICALS* Biaxin *MACROLIDES* Codeine Sulfate *ANALGESICS - OPIOID* Rash.  Medication History Carolyn Glass, Carolyn Glass; 08/12/2016 1:07 PM) Albuterol (90MCG/ACT Aerosol Soln, Inhalation) Active. Benazepril HCl (20MG Tablet, Oral) Active. Lexapro (20MG Tablet, Oral) Active. Allegra (180MG Tablet, Oral) Active. MetFORMIN HCl (500MG Tablet, Oral) Active. PriLOSEC (20MG Capsule DR, Oral) Active. Medications Reconciled  Social History Carolyn Glass; 08/12/2016 1:45 PM) No drug use No caffeine use Tobacco use Never smoker. Alcohol use Occasional alcohol use.  Family History Carolyn Glass; 08/12/2016 1:45 PM) Arthritis Brother, Family Members In General, Father, Mother. Bleeding disorder Daughter. Cancer Family Members In General. Breast Cancer Family Members In General. Thyroid problems Family Members In General. Respiratory Condition Mother. Colon Cancer Family Members In General. Cerebrovascular Accident Father. Diabetes  Mellitus Brother, Family Members In [place], Mother. Kidney Disease Father. Hypertension Family Members In General, Father, Mother.  Pregnancy / Birth History Carolyn Glass,  Carolyn Glass; 08/12/2016 1:45 PM) Age of menopause 2-55 Age at menarche 60 years. Para 1 Gravida 4 Contraceptive History Intrauterine device, Oral contraceptives. Irregular periods Maternal age 84-25 Length (months) of breastfeeding 3-6  Vitals Carolyn Glass Carolyn Glass; 08/12/2016 1:07 PM) 08/12/2016 1:07 PM Weight: 176 lb Temp.: 98.33F  Pulse: 85 (Regular)  BP: 110/72 (Sitting, Left Arm, Standard)       Physical Exam Carolyn Glass; 08/12/2016 1:48 PM) The physical exam findings are as follows: Note:General: Alert, well-developed and well nourished Caucasian female, in no distress Skin: Warm and dry without rash or infection. HEENT: No palpable masses or thyromegaly. Sclera nonicteric. Breasts: No palpable masses in either breast. No skin changes or nipple crusting or discharge. Lymph nodes: No cervical, supraclavicular, or inguinal nodes palpable. Lungs: Breath sounds clear and equal. No wheezing or increased work of breathing. Cardiovascular: Regular rate and rhythm without murmer. Extremities: No edema or joint swelling or deformity. No chronic venous stasis changes. Neurologic: Alert and fully oriented. Gait normal. No focal weakness. Psychiatric: Normal mood and affect. Thought content appropriate with normal judgement and insight    Assessment & Plan Carolyn Glass; 08/12/2016 1:49 PM) MALIGNANT NEOPLASM OF RIGHT BREAST, STAGE 1, ESTROGEN RECEPTOR POSITIVE (C50.911) Impression: 60 year old female with a new diagnosis of cancer of the right breast, multicentric. Clinical stage Ia, ER positive, PR positive, HER-2 negative. I had previously discussed with the patient and her husband surgical treatment options. due to multicentric disease she will require a total mastectomy  with axillary sentinel lymph node biopsy. After consultation she has elected bilateral mastectomy with immediate reconstruction. We again discussed the surgery and indications and risks today and all her questions were answered.

## 2016-08-13 ENCOUNTER — Encounter (HOSPITAL_BASED_OUTPATIENT_CLINIC_OR_DEPARTMENT_OTHER): Payer: Self-pay | Admitting: Anesthesiology

## 2016-08-13 ENCOUNTER — Ambulatory Visit (HOSPITAL_BASED_OUTPATIENT_CLINIC_OR_DEPARTMENT_OTHER)
Admission: RE | Admit: 2016-08-13 | Discharge: 2016-08-14 | Disposition: A | Discharge status: Home / Self Care | Payer: BC Managed Care – PPO | Source: Ambulatory Visit | Attending: General Surgery | Admitting: General Surgery

## 2016-08-13 ENCOUNTER — Encounter (HOSPITAL_COMMUNITY)
Admission: RE | Admit: 2016-08-13 | Discharge: 2016-08-13 | Disposition: A | Discharge status: Home / Self Care | Payer: BC Managed Care – PPO | Source: Ambulatory Visit | Attending: General Surgery | Admitting: General Surgery

## 2016-08-13 ENCOUNTER — Ambulatory Visit (HOSPITAL_BASED_OUTPATIENT_CLINIC_OR_DEPARTMENT_OTHER): Payer: BC Managed Care – PPO | Admitting: Anesthesiology

## 2016-08-13 ENCOUNTER — Encounter (HOSPITAL_BASED_OUTPATIENT_CLINIC_OR_DEPARTMENT_OTHER)
Admission: RE | Disposition: A | Discharge status: Home / Self Care | Payer: Self-pay | Source: Ambulatory Visit | Attending: General Surgery

## 2016-08-13 DIAGNOSIS — J45909 Unspecified asthma, uncomplicated: Secondary | ICD-10-CM | POA: Insufficient documentation

## 2016-08-13 DIAGNOSIS — I10 Essential (primary) hypertension: Secondary | ICD-10-CM | POA: Insufficient documentation

## 2016-08-13 DIAGNOSIS — C50511 Malignant neoplasm of lower-outer quadrant of right female breast: Secondary | ICD-10-CM | POA: Diagnosis not present

## 2016-08-13 DIAGNOSIS — D0502 Lobular carcinoma in situ of left breast: Secondary | ICD-10-CM | POA: Insufficient documentation

## 2016-08-13 DIAGNOSIS — Z888 Allergy status to other drugs, medicaments and biological substances status: Secondary | ICD-10-CM | POA: Insufficient documentation

## 2016-08-13 DIAGNOSIS — C50811 Malignant neoplasm of overlapping sites of right female breast: Secondary | ICD-10-CM

## 2016-08-13 DIAGNOSIS — K219 Gastro-esophageal reflux disease without esophagitis: Secondary | ICD-10-CM | POA: Insufficient documentation

## 2016-08-13 DIAGNOSIS — Z885 Allergy status to narcotic agent status: Secondary | ICD-10-CM | POA: Insufficient documentation

## 2016-08-13 DIAGNOSIS — F419 Anxiety disorder, unspecified: Secondary | ICD-10-CM | POA: Insufficient documentation

## 2016-08-13 DIAGNOSIS — N6012 Diffuse cystic mastopathy of left breast: Secondary | ICD-10-CM | POA: Insufficient documentation

## 2016-08-13 DIAGNOSIS — Z7984 Long term (current) use of oral hypoglycemic drugs: Secondary | ICD-10-CM | POA: Insufficient documentation

## 2016-08-13 DIAGNOSIS — E119 Type 2 diabetes mellitus without complications: Secondary | ICD-10-CM | POA: Insufficient documentation

## 2016-08-13 DIAGNOSIS — M1990 Unspecified osteoarthritis, unspecified site: Secondary | ICD-10-CM | POA: Insufficient documentation

## 2016-08-13 DIAGNOSIS — Z170 Estrogen receptor positive status [ER+]: Secondary | ICD-10-CM | POA: Insufficient documentation

## 2016-08-13 DIAGNOSIS — Z881 Allergy status to other antibiotic agents status: Secondary | ICD-10-CM | POA: Insufficient documentation

## 2016-08-13 DIAGNOSIS — N6022 Fibroadenosis of left breast: Secondary | ICD-10-CM | POA: Insufficient documentation

## 2016-08-13 HISTORY — PX: MASTECTOMY W/ SENTINEL NODE BIOPSY: SHX2001

## 2016-08-13 HISTORY — DX: Major depressive disorder, single episode, unspecified: F32.9

## 2016-08-13 HISTORY — PX: BREAST RECONSTRUCTION WITH PLACEMENT OF TISSUE EXPANDER AND FLEX HD (ACELLULAR HYDRATED DERMIS): SHX6295

## 2016-08-13 HISTORY — DX: Anxiety disorder, unspecified: F41.9

## 2016-08-13 HISTORY — DX: Other seasonal allergic rhinitis: J30.2

## 2016-08-13 HISTORY — DX: Depression, unspecified: F32.A

## 2016-08-13 LAB — GLUCOSE, CAPILLARY
GLUCOSE-CAPILLARY: 163 mg/dL — AB (ref 65–99)
GLUCOSE-CAPILLARY: 190 mg/dL — AB (ref 65–99)
Glucose-Capillary: 205 mg/dL — ABNORMAL HIGH (ref 65–99)

## 2016-08-13 SURGERY — MASTECTOMY WITH SENTINEL LYMPH NODE BIOPSY
Anesthesia: Regional | Site: Chest | Laterality: Bilateral

## 2016-08-13 MED ORDER — DIPHENHYDRAMINE HCL 50 MG/ML IJ SOLN: 12.5000 mg | Freq: Four times a day (QID) | INTRAMUSCULAR | Status: DC | PRN

## 2016-08-13 MED ORDER — METHYLENE BLUE 0.5 % INJ SOLN
INTRAVENOUS | Status: DC | PRN
  Administered 2016-08-13: 5 mL via SUBMUCOSAL

## 2016-08-13 MED ORDER — METHYLENE BLUE 0.5 % INJ SOLN
INTRAVENOUS | Status: AC
  Filled 2016-08-13: qty 10

## 2016-08-13 MED ORDER — FENTANYL CITRATE (PF) 100 MCG/2ML IJ SOLN
INTRAMUSCULAR | Status: AC
  Filled 2016-08-13: qty 2

## 2016-08-13 MED ORDER — PHENYLEPHRINE HCL 10 MG/ML IJ SOLN
INTRAMUSCULAR | Status: DC | PRN
  Administered 2016-08-13 (×3): 80 ug via INTRAVENOUS

## 2016-08-13 MED ORDER — ACETAMINOPHEN 500 MG PO TABS
ORAL_TABLET | ORAL | Status: AC
  Filled 2016-08-13: qty 2

## 2016-08-13 MED ORDER — ROCURONIUM BROMIDE 100 MG/10ML IV SOLN
INTRAVENOUS | Status: DC | PRN
  Administered 2016-08-13: 10 mg via INTRAVENOUS
  Administered 2016-08-13: 30 mg via INTRAVENOUS
  Administered 2016-08-13 (×2): 10 mg via INTRAVENOUS

## 2016-08-13 MED ORDER — CELECOXIB 400 MG PO CAPS
400.0000 mg | ORAL_CAPSULE | ORAL | Status: AC
  Administered 2016-08-13: 400 mg via ORAL

## 2016-08-13 MED ORDER — BENAZEPRIL HCL 20 MG PO TABS: 20.0000 mg | ORAL_TABLET | Freq: Every day | ORAL | Status: DC

## 2016-08-13 MED ORDER — ONDANSETRON HCL 4 MG/2ML IJ SOLN: 4.0000 mg | Freq: Once | INTRAMUSCULAR | Status: DC | PRN

## 2016-08-13 MED ORDER — DIPHENHYDRAMINE HCL 12.5 MG/5ML PO ELIX: 12.5000 mg | ORAL_SOLUTION | Freq: Four times a day (QID) | ORAL | Status: DC | PRN

## 2016-08-13 MED ORDER — MIDAZOLAM HCL 2 MG/2ML IJ SOLN
INTRAMUSCULAR | Status: AC
  Filled 2016-08-13: qty 2

## 2016-08-13 MED ORDER — ACETAMINOPHEN 500 MG PO TABS
1000.0000 mg | ORAL_TABLET | ORAL | Status: AC
  Administered 2016-08-13: 1000 mg via ORAL

## 2016-08-13 MED ORDER — OXYCODONE HCL 5 MG PO TABS
5.0000 mg | ORAL_TABLET | ORAL | 0 refills | Status: DC | PRN
Start: 2016-08-13 — End: 2016-11-03

## 2016-08-13 MED ORDER — PROPOFOL 10 MG/ML IV BOLUS
INTRAVENOUS | Status: AC
Start: 2016-08-13 — End: 2016-08-13
  Filled 2016-08-13: qty 20

## 2016-08-13 MED ORDER — SUCCINYLCHOLINE CHLORIDE 20 MG/ML IJ SOLN
INTRAMUSCULAR | Status: DC | PRN
  Administered 2016-08-13: 100 mg via INTRAVENOUS

## 2016-08-13 MED ORDER — LIDOCAINE 2% (20 MG/ML) 5 ML SYRINGE
INTRAMUSCULAR | Status: AC
  Filled 2016-08-13: qty 5

## 2016-08-13 MED ORDER — CEFAZOLIN SODIUM-DEXTROSE 2-4 GM/100ML-% IV SOLN
2.0000 g | Freq: Three times a day (TID) | INTRAVENOUS | Status: DC
  Administered 2016-08-13 – 2016-08-14 (×2): 2 g via INTRAVENOUS
  Filled 2016-08-13 (×2): qty 100

## 2016-08-13 MED ORDER — ALBUTEROL SULFATE HFA 108 (90 BASE) MCG/ACT IN AERS: 2.0000 | INHALATION_SPRAY | Freq: Four times a day (QID) | RESPIRATORY_TRACT | Status: DC | PRN

## 2016-08-13 MED ORDER — HYDROMORPHONE HCL 1 MG/ML IJ SOLN
0.5000 mg | INTRAMUSCULAR | Status: DC | PRN
Start: 2016-08-13 — End: 2016-08-14
  Administered 2016-08-13: 1 mg via INTRAVENOUS
  Filled 2016-08-13: qty 1

## 2016-08-13 MED ORDER — CEFAZOLIN SODIUM-DEXTROSE 2-4 GM/100ML-% IV SOLN
2.0000 g | INTRAVENOUS | Status: AC
  Administered 2016-08-13: 2 g via INTRAVENOUS

## 2016-08-13 MED ORDER — ONDANSETRON 4 MG PO TBDP: 4.0000 mg | ORAL_TABLET | Freq: Four times a day (QID) | ORAL | Status: DC | PRN

## 2016-08-13 MED ORDER — DEXAMETHASONE SODIUM PHOSPHATE 4 MG/ML IJ SOLN
INTRAMUSCULAR | Status: DC | PRN
  Administered 2016-08-13: 10 mg via INTRAVENOUS

## 2016-08-13 MED ORDER — INSULIN ASPART 100 UNIT/ML ~~LOC~~ SOLN: 0.0000 [IU] | Freq: Three times a day (TID) | SUBCUTANEOUS | Status: DC

## 2016-08-13 MED ORDER — LACTATED RINGERS IV SOLN
INTRAVENOUS | Status: DC
  Administered 2016-08-13 (×3): via INTRAVENOUS

## 2016-08-13 MED ORDER — BUPIVACAINE-EPINEPHRINE (PF) 0.25% -1:200000 IJ SOLN
INTRAMUSCULAR | Status: AC
  Filled 2016-08-13: qty 30

## 2016-08-13 MED ORDER — EPHEDRINE SULFATE 50 MG/ML IJ SOLN
INTRAMUSCULAR | Status: DC | PRN
  Administered 2016-08-13: 15 mg via INTRAVENOUS

## 2016-08-13 MED ORDER — LIDOCAINE HCL (CARDIAC) 20 MG/ML IV SOLN
INTRAVENOUS | Status: DC | PRN
  Administered 2016-08-13: 30 mg via INTRAVENOUS

## 2016-08-13 MED ORDER — MIDAZOLAM HCL 5 MG/5ML IJ SOLN
INTRAMUSCULAR | Status: DC | PRN
  Administered 2016-08-13: 2 mg via INTRAVENOUS

## 2016-08-13 MED ORDER — SUCCINYLCHOLINE CHLORIDE 200 MG/10ML IV SOSY
PREFILLED_SYRINGE | INTRAVENOUS | Status: AC
  Filled 2016-08-13: qty 10

## 2016-08-13 MED ORDER — SUGAMMADEX SODIUM 200 MG/2ML IV SOLN
INTRAVENOUS | Status: DC | PRN
  Administered 2016-08-13: 200 mg via INTRAVENOUS

## 2016-08-13 MED ORDER — ONDANSETRON HCL 4 MG/2ML IJ SOLN: 4.0000 mg | Freq: Four times a day (QID) | INTRAMUSCULAR | Status: DC | PRN

## 2016-08-13 MED ORDER — SODIUM CHLORIDE 0.9 % IV SOLN
INTRAVENOUS | Status: DC | PRN
  Administered 2016-08-13: 800 mL

## 2016-08-13 MED ORDER — FENTANYL CITRATE (PF) 100 MCG/2ML IJ SOLN
25.0000 ug | INTRAMUSCULAR | Status: DC | PRN
  Administered 2016-08-13 (×2): 25 ug via INTRAVENOUS

## 2016-08-13 MED ORDER — SCOPOLAMINE 1 MG/3DAYS TD PT72
1.0000 | MEDICATED_PATCH | Freq: Once | TRANSDERMAL | Status: DC | PRN
Start: 2016-08-13 — End: 2016-08-13

## 2016-08-13 MED ORDER — GABAPENTIN 300 MG PO CAPS
300.0000 mg | ORAL_CAPSULE | ORAL | Status: AC
  Administered 2016-08-13: 300 mg via ORAL

## 2016-08-13 MED ORDER — ONDANSETRON HCL 4 MG/2ML IJ SOLN
INTRAMUSCULAR | Status: DC | PRN
  Administered 2016-08-13: 4 mg via INTRAVENOUS

## 2016-08-13 MED ORDER — SULFAMETHOXAZOLE-TRIMETHOPRIM 800-160 MG PO TABS: 1.0000 | ORAL_TABLET | Freq: Two times a day (BID) | ORAL | 0 refills | Status: DC

## 2016-08-13 MED ORDER — PROPOFOL 10 MG/ML IV BOLUS
INTRAVENOUS | Status: DC | PRN
  Administered 2016-08-13: 50 mg via INTRAVENOUS
  Administered 2016-08-13: 150 mg via INTRAVENOUS

## 2016-08-13 MED ORDER — ONDANSETRON HCL 4 MG/2ML IJ SOLN
INTRAMUSCULAR | Status: AC
  Filled 2016-08-13: qty 2

## 2016-08-13 MED ORDER — ESCITALOPRAM OXALATE 20 MG PO TABS
20.0000 mg | ORAL_TABLET | Freq: Every day | ORAL | Status: DC
  Administered 2016-08-13: 20 mg via ORAL

## 2016-08-13 MED ORDER — CYCLOBENZAPRINE HCL 10 MG PO TABS: 10.0000 mg | ORAL_TABLET | Freq: Three times a day (TID) | ORAL | Status: DC | PRN

## 2016-08-13 MED ORDER — POTASSIUM CHLORIDE IN NACL 20-0.45 MEQ/L-% IV SOLN
INTRAVENOUS | Status: DC
  Administered 2016-08-13: 18:00:00 via INTRAVENOUS

## 2016-08-13 MED ORDER — DEXAMETHASONE SODIUM PHOSPHATE 10 MG/ML IJ SOLN
INTRAMUSCULAR | Status: AC
  Filled 2016-08-13: qty 1

## 2016-08-13 MED ORDER — CYCLOBENZAPRINE HCL 10 MG PO TABS: 10.0000 mg | ORAL_TABLET | Freq: Three times a day (TID) | ORAL | 0 refills | Status: DC | PRN

## 2016-08-13 MED ORDER — FENTANYL CITRATE (PF) 100 MCG/2ML IJ SOLN
INTRAMUSCULAR | Status: DC | PRN
  Administered 2016-08-13: 25 ug via INTRAVENOUS
  Administered 2016-08-13: 50 ug via INTRAVENOUS
  Administered 2016-08-13 (×2): 25 ug via INTRAVENOUS
  Administered 2016-08-13: 50 ug via INTRAVENOUS
  Administered 2016-08-13: 100 ug via INTRAVENOUS
  Administered 2016-08-13: 25 ug via INTRAVENOUS
  Administered 2016-08-13: 100 ug via INTRAVENOUS

## 2016-08-13 MED ORDER — TECHNETIUM TC 99M SULFUR COLLOID FILTERED
1.0000 | Freq: Once | INTRAVENOUS | Status: AC | PRN
  Administered 2016-08-13: 1 via INTRADERMAL

## 2016-08-13 MED ORDER — MIDAZOLAM HCL 2 MG/2ML IJ SOLN
1.0000 mg | INTRAMUSCULAR | Status: DC | PRN
  Administered 2016-08-13: 1 mg via INTRAVENOUS
  Administered 2016-08-13: 2 mg via INTRAVENOUS

## 2016-08-13 MED ORDER — CHLORHEXIDINE GLUCONATE CLOTH 2 % EX PADS: 6.0000 | MEDICATED_PAD | Freq: Once | CUTANEOUS | Status: DC

## 2016-08-13 MED ORDER — FENTANYL CITRATE (PF) 100 MCG/2ML IJ SOLN
50.0000 ug | INTRAMUSCULAR | Status: DC | PRN
  Administered 2016-08-13: 100 ug via INTRAVENOUS
  Administered 2016-08-13: 50 ug via INTRAVENOUS

## 2016-08-13 MED ORDER — CELECOXIB 200 MG PO CAPS
ORAL_CAPSULE | ORAL | Status: AC
  Filled 2016-08-13: qty 2

## 2016-08-13 MED ORDER — SODIUM CHLORIDE 0.9 % IJ SOLN
INTRAMUSCULAR | Status: AC
  Filled 2016-08-13: qty 10

## 2016-08-13 MED ORDER — SUGAMMADEX SODIUM 200 MG/2ML IV SOLN
INTRAVENOUS | Status: AC
  Filled 2016-08-13: qty 2

## 2016-08-13 MED ORDER — SODIUM CHLORIDE 0.9 % IJ SOLN
INTRAMUSCULAR | Status: DC | PRN
  Administered 2016-08-13: 3 mL via INTRAVENOUS

## 2016-08-13 MED ORDER — PHENYLEPHRINE 40 MCG/ML (10ML) SYRINGE FOR IV PUSH (FOR BLOOD PRESSURE SUPPORT)
PREFILLED_SYRINGE | INTRAVENOUS | Status: AC
  Filled 2016-08-13: qty 10

## 2016-08-13 MED ORDER — OXYCODONE HCL 5 MG PO TABS
5.0000 mg | ORAL_TABLET | ORAL | Status: DC | PRN
  Administered 2016-08-14 (×2): 10 mg via ORAL
  Filled 2016-08-13 (×2): qty 2

## 2016-08-13 MED ORDER — PANTOPRAZOLE SODIUM 40 MG PO TBEC
80.0000 mg | DELAYED_RELEASE_TABLET | Freq: Every day | ORAL | Status: DC
  Administered 2016-08-13: 80 mg via ORAL

## 2016-08-13 MED ORDER — KETOROLAC TROMETHAMINE 15 MG/ML IJ SOLN
15.0000 mg | Freq: Three times a day (TID) | INTRAMUSCULAR | Status: DC
  Administered 2016-08-13 – 2016-08-14 (×2): 15 mg via INTRAVENOUS
  Filled 2016-08-13 (×2): qty 1

## 2016-08-13 MED ORDER — EPHEDRINE 5 MG/ML INJ
INTRAVENOUS | Status: AC
  Filled 2016-08-13: qty 10

## 2016-08-13 MED ORDER — GABAPENTIN 300 MG PO CAPS
ORAL_CAPSULE | ORAL | Status: AC
  Filled 2016-08-13: qty 1

## 2016-08-13 MED ORDER — EPHEDRINE SULFATE 50 MG/ML IJ SOLN: INTRAMUSCULAR | Status: DC | PRN

## 2016-08-13 MED ORDER — CEFAZOLIN SODIUM-DEXTROSE 2-4 GM/100ML-% IV SOLN
INTRAVENOUS | Status: AC
  Filled 2016-08-13: qty 100

## 2016-08-13 SURGICAL SUPPLY — 102 items
ALLODERM 8X16 READY TO USE (Tissue) ×2 IMPLANT
ALLODERM RTU 8X16 (Tissue) ×4 IMPLANT
APL SKNCLS STERI-STRIP NONHPOA (GAUZE/BANDAGES/DRESSINGS)
APPLIER CLIP 11 MED OPEN (CLIP)
APPLIER CLIP 9.375 MED OPEN (MISCELLANEOUS)
APR CLP MED 11 20 MLT OPN (CLIP)
APR CLP MED 9.3 20 MLT OPN (MISCELLANEOUS)
BAG DECANTER FOR FLEXI CONT (MISCELLANEOUS) ×3 IMPLANT
BENZOIN TINCTURE PRP APPL 2/3 (GAUZE/BANDAGES/DRESSINGS) IMPLANT
BINDER BREAST LRG (GAUZE/BANDAGES/DRESSINGS) ×4 IMPLANT
BINDER BREAST MEDIUM (GAUZE/BANDAGES/DRESSINGS) IMPLANT
BINDER BREAST XLRG (GAUZE/BANDAGES/DRESSINGS) ×1 IMPLANT
BINDER BREAST XXLRG (GAUZE/BANDAGES/DRESSINGS) IMPLANT
BLADE CLIPPER SURG (BLADE) IMPLANT
BLADE HEX COATED 2.75 (ELECTRODE) ×3 IMPLANT
BLADE SURG 10 STRL SS (BLADE) ×6 IMPLANT
BLADE SURG 15 STRL LF DISP TIS (BLADE) ×2 IMPLANT
BLADE SURG 15 STRL SS (BLADE) ×3
BNDG GAUZE ELAST 4 BULKY (GAUZE/BANDAGES/DRESSINGS) ×6 IMPLANT
CANISTER SUCT 1200ML W/VALVE (MISCELLANEOUS) ×6 IMPLANT
CHLORAPREP W/TINT 26ML (MISCELLANEOUS) ×6 IMPLANT
CLIP APPLIE 11 MED OPEN (CLIP) IMPLANT
CLIP APPLIE 9.375 MED OPEN (MISCELLANEOUS) IMPLANT
COVER BACK TABLE 60X90IN (DRAPES) ×3 IMPLANT
COVER MAYO STAND STRL (DRAPES) ×6 IMPLANT
COVER PROBE W GEL 5X96 (DRAPES) ×3 IMPLANT
DECANTER SPIKE VIAL GLASS SM (MISCELLANEOUS) ×3 IMPLANT
DEVICE DISSECT PLASMABLAD 3.0S (MISCELLANEOUS) ×2 IMPLANT
DEVICE DUBIN W/COMP PLATE 8390 (MISCELLANEOUS) ×2 IMPLANT
DRAIN CHANNEL 15F RND FF W/TCR (WOUND CARE) ×3 IMPLANT
DRAIN CHANNEL 19F RND (DRAIN) ×3 IMPLANT
DRAIN HEMOVAC 1/8 X 5 (WOUND CARE) IMPLANT
DRAPE LAPAROSCOPIC ABDOMINAL (DRAPES) ×3 IMPLANT
DRAPE SURG 17X23 STRL (DRAPES) IMPLANT
DRAPE TOP ARMCOVERS (MISCELLANEOUS) ×3 IMPLANT
DRAPE UTILITY XL STRL (DRAPES) ×3 IMPLANT
DRSG PAD ABDOMINAL 8X10 ST (GAUZE/BANDAGES/DRESSINGS) ×6 IMPLANT
DRSG TEGADERM 4X10 (GAUZE/BANDAGES/DRESSINGS) IMPLANT
DRSG TEGADERM 4X4.75 (GAUZE/BANDAGES/DRESSINGS) IMPLANT
ELECT BLADE 4.0 EZ CLEAN MEGAD (MISCELLANEOUS) ×3
ELECT COATED BLADE 2.86 ST (ELECTRODE) ×3 IMPLANT
ELECT REM PT RETURN 9FT ADLT (ELECTROSURGICAL) ×6
ELECTRODE BLDE 4.0 EZ CLN MEGD (MISCELLANEOUS) ×2 IMPLANT
ELECTRODE REM PT RTRN 9FT ADLT (ELECTROSURGICAL) ×4 IMPLANT
EVACUATOR SILICONE 100CC (DRAIN) ×6 IMPLANT
EXPANDER TISSUE MX 500CC (Prosthesis & Implant Plastic) IMPLANT
GAUZE SPONGE 4X4 12PLY STRL (GAUZE/BANDAGES/DRESSINGS) ×3 IMPLANT
GAUZE VASELINE 3X9 (GAUZE/BANDAGES/DRESSINGS) ×3 IMPLANT
GLOVE BIO SURGEON STRL SZ 6 (GLOVE) ×8 IMPLANT
GLOVE BIOGEL PI IND STRL 8 (GLOVE) ×2 IMPLANT
GLOVE BIOGEL PI INDICATOR 8 (GLOVE) ×2
GLOVE ECLIPSE 7.5 STRL STRAW (GLOVE) ×4 IMPLANT
GOWN STRL REUS W/ TWL LRG LVL3 (GOWN DISPOSABLE) ×6 IMPLANT
GOWN STRL REUS W/ TWL XL LVL3 (GOWN DISPOSABLE) ×2 IMPLANT
GOWN STRL REUS W/TWL LRG LVL3 (GOWN DISPOSABLE) ×9
GOWN STRL REUS W/TWL XL LVL3 (GOWN DISPOSABLE) ×3
IV NS 500ML (IV SOLUTION) ×3
IV NS 500ML BAXH (IV SOLUTION) ×2 IMPLANT
KIT FILL SYSTEM UNIVERSAL (SET/KITS/TRAYS/PACK) ×3 IMPLANT
LIGHT WAVEGUIDE WIDE FLAT (MISCELLANEOUS) IMPLANT
LIQUID BAND (GAUZE/BANDAGES/DRESSINGS) ×3 IMPLANT
NDL HYPO 25X1 1.5 SAFETY (NEEDLE) ×4 IMPLANT
NDL SAFETY ECLIPSE 18X1.5 (NEEDLE) ×2 IMPLANT
NEEDLE HYPO 18GX1.5 SHARP (NEEDLE) ×3
NEEDLE HYPO 25X1 1.5 SAFETY (NEEDLE) ×6 IMPLANT
NS IRRIG 1000ML POUR BTL (IV SOLUTION) ×3 IMPLANT
PACK BASIN DAY SURGERY FS (CUSTOM PROCEDURE TRAY) ×6 IMPLANT
PACK UNIVERSAL I (CUSTOM PROCEDURE TRAY) ×3 IMPLANT
PENCIL BUTTON HOLSTER BLD 10FT (ELECTRODE) ×6 IMPLANT
PIN SAFETY STERILE (MISCELLANEOUS) ×6 IMPLANT
PLASMABLADE 3.0S (MISCELLANEOUS) ×6
SHEET MEDIUM DRAPE 40X70 STRL (DRAPES) ×2 IMPLANT
SLEEVE SCD COMPRESS KNEE MED (MISCELLANEOUS) ×3 IMPLANT
SPONGE LAP 18X18 X RAY DECT (DISPOSABLE) ×9 IMPLANT
SPONGE LAP 4X18 X RAY DECT (DISPOSABLE) IMPLANT
STAPLER VISISTAT 35W (STAPLE) ×3 IMPLANT
STRIP CLOSURE SKIN 1/2X4 (GAUZE/BANDAGES/DRESSINGS) IMPLANT
SUT ETHILON 2 0 FS 18 (SUTURE) ×2 IMPLANT
SUT ETHILON 3 0 PS 1 (SUTURE) ×4 IMPLANT
SUT MNCRL AB 4-0 PS2 18 (SUTURE) ×4 IMPLANT
SUT MON AB 4-0 PC3 18 (SUTURE) ×6 IMPLANT
SUT SILK 2 0 SH (SUTURE) ×1 IMPLANT
SUT VIC AB 0 CT1 27 (SUTURE) ×12
SUT VIC AB 0 CT1 27XBRD ANBCTR (SUTURE) IMPLANT
SUT VIC AB 3-0 54X BRD REEL (SUTURE) IMPLANT
SUT VIC AB 3-0 BRD 54 (SUTURE) ×3
SUT VIC AB 3-0 SH 27 (SUTURE) ×15
SUT VIC AB 3-0 SH 27X BRD (SUTURE) ×4 IMPLANT
SUT VICRYL 3-0 CR8 SH (SUTURE) ×1 IMPLANT
SUT VICRYL 4-0 PS2 18IN ABS (SUTURE) ×4 IMPLANT
SUT VLOC 180 0 24IN GS25 (SUTURE) IMPLANT
SYR BULB IRRIGATION 50ML (SYRINGE) ×3 IMPLANT
SYR CONTROL 10ML LL (SYRINGE) ×6 IMPLANT
TAPE MEASURE VINYL STERILE (MISCELLANEOUS) ×3 IMPLANT
TISSUE ALLDRM RTU 8X16 (Tissue) IMPLANT
TISSUE EXPANDER MX 500CC (Prosthesis & Implant Plastic) ×6 IMPLANT
TOWEL OR 17X24 6PK STRL BLUE (TOWEL DISPOSABLE) ×12 IMPLANT
TOWEL OR NON WOVEN STRL DISP B (DISPOSABLE) ×3 IMPLANT
TRAY FOLEY BAG SILVER LF 16FR (SET/KITS/TRAYS/PACK) ×1 IMPLANT
TUBE CONNECTING 20X1/4 (TUBING) ×3 IMPLANT
UNDERPAD 30X30 (UNDERPADS AND DIAPERS) ×6 IMPLANT
YANKAUER SUCT BULB TIP NO VENT (SUCTIONS) ×7 IMPLANT

## 2016-08-13 NOTE — Op Note (Signed)
Operative Note   DATE OF OPERATION: 12.8.17  LOCATION: [facility]-observation  SURGICAL DIVISION: Plastic Surgery  PREOPERATIVE DIAGNOSES:  1.Right breast cancer   POSTOPERATIVE DIAGNOSES:  same  PROCEDURE:  1. Bilateral breast reconstruction with tissue expanders 2. Acellular dermis (Alloderm) for breast reconstruction 175cm2  SURGEON: Irene Limbo MD MBA  ASSISTANT: none  ANESTHESIA:  General.   EBL: 150 ml for entire case  COMPLICATIONS: None immediate.   INDICATIONS FOR PROCEDURE:  The patient, Carolyn Glass, is a 60 y.o. female born on March 13, 1956, is here for immediate expander based reconstruction following bilateral skin sparing mastecteomies.   FINDINGS: Natrelle 133MX-13-T 500 ml tissue expanders placed bilateral, initial fill volume 275 ml. RIGHT SN [id number] LEFT [id number]  DESCRIPTION OF PROCEDURE:  The patient was marked with the patient in the preoperative area to mark sternal notch, chest midline, anterior axillary lines and inframammary folds. The patient was taken to the operating room. SCDs were placed and IV antibiotics were given. The patient's operative site was prepped and draped in a sterile fashion. A time out was performed and all information was confirmed to be correct. Following completion of mastectomies, reconstruction began on left side. The inferior insertions of pectoralis were elevated continuous with anterior rectus fascia to inframammary fold.Submuscular dissection completed toward clavicle. Acellular dermis was perforated and sewn to inferior border of pectoralis major with running 3-0 vicryl. A 19 Fr drain was placed in subcutaneous position laterally and submuscular position medialy and secured to skin with 2-0 nylon. The cavity was irrigated with solution containing Ancef, gentamicin, and bacitracin. Hemostasis was ensured. Cavity irrigated with Betadine. The tissue expander was prepared and placed in submuscular position. The expander  was secured to chest wall with a 3-0 vicryl. The inferior border of the acellular dermis was inset to serratus fascia with cuff of acelluar dermis wrapping over lateral border expander. The mastectomy flap soft tissue over superior pole was then advance inferiorly and secured to anterior surface pectoralis with 0-vicryl. Laterally the mastectomy flap over posterior axillary line was advanced anteriorly and the subcutaneous tissue and superficial fascia was secured to serratus fascia and acellular dermis with 0-vicryl.  Skin closure completedwith 3-0 vicryl in fascial layer and 4-0 vicryl in dermis. Skin closure completed with 4-0 monocryl subcuticular and tissue adhesive.  I then directed attention to right chest. Similar submuscular dissection completed and acellular dermis secured to inferior border pectoralis muscle. Drain placed and expander placed. Inferior border ADM secured to chest wall. Laterally the mastectomy flap over posterior axillary line was advanced anteriorly and the subcutaneous tissue and superficial fascia was secured to serratus fascia and acellular dermis with 0-vicryl. Skin closure was completedin the same manner as left. The ports were accessed and filled to 275 ml. Dry dressing and breast binder applied  The patient was allowed to wake from anesthesia, extubated and taken to the recovery room in satisfactory condition.   SPECIMENS: none  DRAINS: 19 Fr JP in right and left chest  Irene Limbo, MD [facility]astic [facility] Surgery [phone], pin [phone]

## 2016-08-13 NOTE — Interval H&P Note (Signed)
History and Physical Interval Note:  08/13/2016 9:29 AM  Carolyn Glass  has presented today for surgery, with the diagnosis of right breast cancer  The various methods of treatment have been discussed with the patient and family. After consideration of risks, benefits and other options for treatment, the patient has consented to  Bilateral breast reconstruction with tissue expanders possible acellular dermis as a surgical intervention .  The patient's history has been reviewed, patient examined, no change in status, stable for surgery.  I have reviewed the patient's chart and labs.  Questions were answered to the patient's satisfaction.     Cleotha Tsang

## 2016-08-13 NOTE — Transfer of Care (Signed)
Immediate Anesthesia Transfer of Care Note  Patient: MARGURETTE CAISON  Procedure(s) Performed: Procedure(s): BILATERAL TOTAL MASTECTOMY WITH RIGHT AXILLARY SENTINEL LYMPH NODE BIOPSY (Bilateral) BREAST RECONSTRUCTION WITH PLACEMENT OF TISSUE EXPANDER AND ALLODERM (Bilateral)  Patient Location: PACU  Anesthesia Type:GA combined with regional for post-op pain  Level of Consciousness: sedated and responds to stimulation  Airway & Oxygen Therapy: Patient Spontanous Breathing and Patient connected to face mask oxygen  Post-op Assessment: Report given to RN and Post -op Vital signs reviewed and stable  Post vital signs: Reviewed and stable  Last Vitals:  Vitals:   08/13/16 1110 08/13/16 1115  BP:  132/65  Pulse: 89 90  Resp: 11 11  Temp:      Last Pain:  Vitals:   08/13/16 0910  TempSrc: Oral         Complications: No apparent anesthesia complications

## 2016-08-13 NOTE — Anesthesia Preprocedure Evaluation (Addendum)
Anesthesia Evaluation  Patient identified by MRN, date of birth, ID band Patient awake    Reviewed: Allergy & Precautions, NPO status , Patient's Chart, lab work & pertinent test results  Airway Mallampati: II  TM Distance: >3 FB Neck ROM: Full    Dental  (+) Teeth Intact, Dental Advisory Given   Pulmonary    breath sounds clear to auscultation       Cardiovascular hypertension,  Rhythm:Regular Rate:Normal     Neuro/Psych    GI/Hepatic   Endo/Other  diabetes  Renal/GU      Musculoskeletal   Abdominal   Peds  Hematology   Anesthesia Other Findings   Reproductive/Obstetrics                             Anesthesia Physical Anesthesia Plan  ASA: III  Anesthesia Plan: General and Regional   Post-op Pain Management:    Induction: Intravenous  Airway Management Planned: Oral ETT  Additional Equipment:   Intra-op Plan:   Post-operative Plan: Extubation in OR  Informed Consent: I have reviewed the patients History and Physical, chart, labs and discussed the procedure including the risks, benefits and alternatives for the proposed anesthesia with the patient or authorized representative who has indicated his/her understanding and acceptance.   Dental advisory given  Plan Discussed with: CRNA and Anesthesiologist  Anesthesia Plan Comments:         Anesthesia Quick Evaluation  

## 2016-08-13 NOTE — Discharge Instructions (Signed)

## 2016-08-13 NOTE — Interval H&P Note (Signed)
History and Physical Interval Note:  08/13/2016 12:32 PM  Carolyn Glass  has presented today for surgery, with the diagnosis of right breast cancer  The various methods of treatment have been discussed with the patient and family. After consideration of risks, benefits and other options for treatment, the patient has consented to  Procedure(s): BILATERAL TOTAL MASTECTOMY WITH RIGHT AXILLARY SENTINEL LYMPH NODE BIOPSY (Bilateral) BREAST RECONSTRUCTION WITH PLACEMENT OF TISSUE EXPANDER AND POSSIBLE CORTIVA (Bilateral) as a surgical intervention .  The patient's history has been reviewed, patient examined, no change in status, stable for surgery.  I have reviewed the patient's chart and labs.  Questions were answered to the patient's satisfaction.     Kaidin Boehle T

## 2016-08-13 NOTE — Anesthesia Procedure Notes (Addendum)
Anesthesia Regional Block:  Pectoralis block  Pre-Anesthetic Checklist: ,, timeout performed, Correct Patient, Correct Site, Correct Laterality, Correct Procedure, Correct Position, site marked, Risks and benefits discussed,  Surgical consent,  Pre-op evaluation,  At surgeon's request and post-op pain management  Laterality: Right  Prep: chloraprep       Needles:   Needle Type: Echogenic Stimulator Needle     Needle Length: 9cm 9 cm Needle Gauge: 22 and 22 G    Additional Needles:  Procedures: ultrasound guided (picture in chart) Pectoralis block Narrative:  Start time: 08/13/2016 10:45 AM End time: 08/13/2016 10:50 AM Injection made incrementally with aspirations every 5 mL.  Performed by: Personally   Additional Notes: 25 cc 0.5% Naropin injected easily

## 2016-08-13 NOTE — Anesthesia Procedure Notes (Addendum)
Anesthesia Regional Block:  Pectoralis block  Pre-Anesthetic Checklist: ,, timeout performed, Correct Patient, Correct Site, Correct Laterality, Correct Procedure, Correct Position, site marked, Risks and benefits discussed,  Surgical consent,  Pre-op evaluation,  At surgeon's request and post-op pain management  Laterality: Left  Prep: chloraprep       Needles:  Injection technique: Single-shot  Needle Type: Echogenic Stimulator Needle     Needle Length: 9cm 9 cm Needle Gauge: 21 and 21 G    Additional Needles:  Procedures: ultrasound guided (picture in chart) Pectoralis block Narrative:  Start time: 08/13/2016 10:40 AM End time: 08/13/2016 10:45 AM Injection made incrementally with aspirations every 5 mL.  Performed by: Personally  Anesthesiologist: Mechel Haggard  Additional Notes: 20 cc 0.5% Naropin injected easily

## 2016-08-13 NOTE — Anesthesia Procedure Notes (Signed)
Procedure Name: Intubation Date/Time: 08/13/2016 12:45 PM Performed by: Toula Moos L Pre-anesthesia Checklist: Patient identified, Emergency Drugs available, Suction available, Patient being monitored and Timeout performed Patient Re-evaluated:Patient Re-evaluated prior to inductionOxygen Delivery Method: Circle system utilized Preoxygenation: Pre-oxygenation with 100% oxygen Intubation Type: IV induction Ventilation: Mask ventilation without difficulty Laryngoscope Size: Miller and 3 Grade View: Grade III Tube type: Oral Tube size: 8.0 mm Number of attempts: 2 Airway Equipment and Method: Stylet and Oral airway Placement Confirmation: ETT inserted through vocal cords under direct vision,  positive ETCO2 and breath sounds checked- equal and bilateral Secured at: 21 cm Tube secured with: Tape Dental Injury: Teeth and Oropharynx as per pre-operative assessment  Difficulty Due To: Difficult Airway- due to large tongue, Difficult Airway- due to limited oral opening and Difficult Airway- due to dentition

## 2016-08-13 NOTE — Op Note (Signed)
Preoperative Diagnosis: right breast cancer  Postoprative Diagnosis: right breast cancer  Procedure: Procedure(s): Blue dye injection right breast,  BILATERAL TOTAL MASTECTOMY WITH RIGHT AXILLARY SENTINEL LYMPH NODE BIOPSY    Surgeon: [name] T   Assistants: Irene Limbo  Anesthesia:  General endotracheal - Double lumen tube  Indications: Patient is a 60 year old female with a recent diagnosis of multi centric stage I cancer of the right breast. She has a family history of breast cancer as well and dense breast tissue with her diagnosis obtained with screening MRI with a negative mammogram. She has elected bilateral total mastectomy is initial surgical treatment and we plan right axillary sentinel lymph node. The procedure and alternatives and risks have been discussed with the patient extensively and detailed elsewhere.    Procedure Detail: Preoperatively the patient underwent injection of 1 mCi of technetium sulfur colloid intradermally around the right nipple in the holding area.  She underwent bilateral pectoral block by anesthesia. She was taken to the operating room, placed in the supine position on the operating table, and general endotracheal anesthesia induced. Initially I was unable to obtain significant counts in the right axilla with the neoprobe and therefore under sterile technique after patient timeout I injected 5 mL of dilute methylene blue subcutaneously beneath the right nipple and massaged this for several minutes. The anterior chest, breasts, axilla and upper arms were widely sterilely prepped and draped. Patient timeout was again performed and correct procedure verified. She received preoperative IV antibiotics and PAS were placed. The left side was approached initially. A transverse elliptical skin sparing incision encompassing the nipple areolar complex was used and made with the plasma blade. Skin and subcutaneous flaps were then raised medially to the edge  of the sternum, superiorly toward the clavicle, inferiorly to the inframammary crease and laterally out to the anterior border of latissimus dorsi. After complete mobilization of the flaps the breast was reflected off the chest wall working medial to lateral with the plasma blade separating the breast from the pectoralis major and serratus working laterally. The latissimus was identified and the specimen was dissected off the anterior border of latissimus. Further attachments along the serratus were divided and was mobilized from the edge of the pectoralis major. The clavipectoral fascia was incised and the pectoralis minor identified. There were no abnormal palpable lymph nodes. I then came across the low axilla with Kelly clamps and the specimen was removed and oriented and sent for permanent pathology. Plans were tied with 3-0 Vicryl. The wound was thoroughly irrigated and hemostasis assured. Flaps appeared healthy. Moist saline gauze was placed in the wound and it was isolated and attention was turned to the right side. An identical incision and skin flap development was performed on the right. As the dissection progressed toward the axilla the clavipectoral fascia was incised in the axilla exposed. Using the neoprobe for guidance a hot slightly enlarged lymph node was identified. It also contained blue dye. This was completely excised with the plasma blade and ex vivo had counts of about 300. It was sent as hot blue right axillary sentinel lymph node. Background in the axilla at this point was minimal and I did not feel any abnormally enlarged lymph nodes. We then came across the low axilla with clamps and removed the specimen which was oriented and sent for pathology and these were tied with 3-0 Vicryl. The neoprobe was used to try to identify any further hot nodes in the axillary tail specimen or in the axilla  and I could not identify any significant further counts in no blue dye was seen. This wound was also  thoroughly irrigated and hemostasis assured. Dr. Iran Planas then proceeded with reconstruction as planned.   Findings: As above  Estimated Blood Loss:  less than 100 mL         Drains: Per Dr. Iran Planas  Blood Given: none          Specimens: #1 left total mastectomy   #2 right total mastectomy   #3 right axillary sentinel lymph node        Complications:  * No complications entered in OR log *         Disposition: PACU - hemodynamically stable.         Condition: stable

## 2016-08-13 NOTE — Anesthesia Postprocedure Evaluation (Signed)
Anesthesia Post Note  Patient: Carolyn Glass  Procedure(s) Performed: Procedure(s) (LRB): BILATERAL TOTAL MASTECTOMY WITH RIGHT AXILLARY SENTINEL LYMPH NODE BIOPSY (Bilateral) BREAST RECONSTRUCTION WITH PLACEMENT OF TISSUE EXPANDER AND ALLODERM (Bilateral)  Patient location during evaluation: PACU Anesthesia Type: General and Regional Level of consciousness: awake, awake and alert, oriented and sedated Pain management: pain level controlled Vital Signs Assessment: post-procedure vital signs reviewed and stable Respiratory status: spontaneous breathing, nonlabored ventilation and respiratory function stable Cardiovascular status: blood pressure returned to baseline Anesthetic complications: no    Last Vitals:  Vitals:   08/13/16 1722 08/13/16 1730  BP: 119/76 119/71  Pulse: 88 89  Resp: (!) 48 16  Temp: 36.6 C     Last Pain:  Vitals:   08/13/16 0910  TempSrc: Oral                 Teira Arcilla COKER

## 2016-08-14 DIAGNOSIS — C50511 Malignant neoplasm of lower-outer quadrant of right female breast: Secondary | ICD-10-CM | POA: Diagnosis not present

## 2016-08-14 LAB — GLUCOSE, CAPILLARY: GLUCOSE-CAPILLARY: 152 mg/dL — AB (ref 65–99)

## 2016-08-16 ENCOUNTER — Encounter (HOSPITAL_BASED_OUTPATIENT_CLINIC_OR_DEPARTMENT_OTHER): Payer: Self-pay | Admitting: General Surgery

## 2016-08-16 NOTE — Addendum Note (Signed)
Addendum  created 08/16/16 1518 by Roberts Gaudy, MD   Anesthesia Intra Blocks edited, Sign clinical note

## 2016-08-17 ENCOUNTER — Telehealth: Payer: Self-pay | Admitting: *Deleted

## 2016-08-17 NOTE — Telephone Encounter (Signed)
Received order per Dr. Burr Medico for Oncotype testing. Requisition sent to pathology. PAC sent to [facility] and [facility].

## 2016-08-26 ENCOUNTER — Telehealth: Payer: Self-pay | Admitting: *Deleted

## 2016-08-26 NOTE — Telephone Encounter (Signed)
Received Oncotype Dx results of 11.  Placed a copy in Dr. Ernestina Penna box and gave Varney Biles a copy.  Took a copy to HIM to scan.

## 2016-08-27 ENCOUNTER — Encounter (HOSPITAL_COMMUNITY): Payer: Self-pay

## 2016-09-08 NOTE — Progress Notes (Signed)
[place]  [phone] [phone]  Clinic Follow Up Note   Patient Care Team: [name], MD as PCP - General (Family Medicine) [name], MD as Consulting Physician (General Surgery) [name], MD as Consulting Physician (Hematology) [name], MD as Attending Physician (Radiation Oncology) 09/10/2016  CHIEF COMPLAINTS:  Follow up for right multifocal breast cancer  Oncology History   Malignant neoplasm of lower-outer quadrant of right female breast [facility])   Staging form: Breast, AJCC 7th Edition   - Clinical stage from 06/23/2016: Stage IA (T1c, N0, M0) - Unsigned      Malignant neoplasm of lower-outer quadrant of right female breast ([place])   03/01/2016 Mammogram    Bilateral screening mammogram was negative      06/08/2016 Imaging    Bilateral breast MRI with and without contrast showed 4 lesions in the right breast, 2 adjacent 1.4 cm mass in the inferior aspect of the right breast, and in the right lateral aspect of the breast, there are 8 mm and a 7 mm irregular enhancing mass. Multiple small enhancing lesions within the liver, indeterminate. No axillary adenopathy.      06/16/2016 Initial Diagnosis    Malignant neoplasm of lower-outer quadrant of right female breast ([place])      06/16/2016 Initial Biopsy    Right breast needle core biopsy at 9 clock, 6:30 clock, 6 clock showed invasive bladder carcinoma, and LCIS      06/16/2016 Receptors her2    two right breast biopsy showed ER strongly positive, PR strongly positive, HER-2 negative, Ki-67 5-10%       06/16/2016 Imaging    Right breast and axilla ultrasound showed a 1.4 cm mass at the 6:30 o'clock, 1.0 cm mass at the 6:00, and 76m mass at 9:00 position. Axilla was negative.      06/28/2016 Imaging    MRI ABDOMEN W W/O CONTRAST 06/28/2016 IMPRESSION: 1. Multiple hepatic hemangiomas.  No suspicious liver lesion. 2. Hepatic steatosis. 3. No evidence of abdominal  metastasis.      06/28/2016 Genetic Testing    Patient has genetic testing done for breast cancer. Results revealed patient has the following variant called, MSH2 c.2650A>T.      08/13/2016 Surgery    Bilateral simple mastectomy and right SLN biopsy        08/13/2016 Pathology Results    Right mastectomy showed invasive ductal carcinoma, G2, multifocal, 1.5cm, 1.5cm, and 1.0cm. (+) LCIS, margins are negative, 1 sentinel lymph node was negative. Left simple mastectomy showed LCIS, ADH, no malignancy.       08/13/2016 Oncotype testing    Oncotype recurrence score 11, which predicts 10 year distant recurrence risk of 7% with tamoxifen alone, low risk category. No adjuvant chemo recommended based on this.        HISTORY OF PRESENTING ILLNESS: (06/23/2016) PForbes Cellar89y.o. female is here because of her newly diagnosed right breast cancer. She is accompanied by her husband to our multidisciplinary clinic today.  [facility]. She also had right breast lumpectomy in 2009 for benign lesion. Her last screening mammogram was negative in June 2017. Due to her family history of breast cancer, her primary care physician felt she is at high risk for breast cancer, and referred her for breast MRI on 06/08/2016, which showed 4 small lesions in the right breast. She underwent ultrasound of the right breast and axilla, which showed 2 adjacent  breast mass at 6:00 position, and a smaller mass at 9 clock position, axillary was negative. All of the 3 right breast lesions were biopsied under ultrasound guidance, which all showed invasive lobular carcinoma and LCIS. He appears dry positive, HER-2 negative.  She feels well, denies any significant pain, except arthritis pain in the left hip, manageable. She has good appetite and energy level, works full-time at [employer]. She is married, lives with her husband. Her paternal grandmother  and paternal cousin had breast cancer in their 32s.  GYN HISTORY  Menarchal: 11  LMP: 60 yo  Contraceptive: no  HRT: no  G5P1: one daughter 21 yo   CURRENT THERAPY: Pending Exemestane (Aromasin) 25 mg daily starting September 25, 2016  INTERVAL HISTORY: [name] returns for follow up and to discuss her Oncotype Score and future treatment options.  The patient presented to Genetics on 06/28/16 and it revealed a variant called MSH2 c.2650A>T.  The patient saw Dr. Iran Planas about recontruction on 09/09/16. And she saw Dr. [name] yesterday. The patient's drain tubes are out.  The patient denies any problems at this time. She has some problems raising her arms post surgery and she is going through physical therapy at this time. The patient has arthritis in her hips, knees, and shoulders. She had a right hip replacement in May 2017 and will have replacement of the left hip in the future. She has some difficulty getting in and out of cars. She reports some hot flashes since surgery.  MEDICAL HISTORY:  Past Medical History:  Diagnosis Date  . Anxiety   . Arthritis   . Asthma    allergy and exercise induced asthma   . Depression   . Diabetes mellitus without complication ([place])   . Family history of breast cancer   . Family history of colon cancer   . Family history of stomach cancer   . GERD (gastroesophageal reflux disease)   . Hypertension   . Malignant neoplasm of lower-outer quadrant of right female breast ([place]) 06/21/2016  . Seasonal allergies     SURGICAL HISTORY: Past Surgical History:  Procedure Laterality Date  . BREAST LUMPECTOMY     right   . BREAST RECONSTRUCTION WITH PLACEMENT OF TISSUE EXPANDER AND FLEX HD (ACELLULAR HYDRATED DERMIS) Bilateral 08/13/2016   Procedure: BREAST RECONSTRUCTION WITH PLACEMENT OF TISSUE EXPANDER AND ALLODERM;  Surgeon: Irene Limbo, MD;  Location: [facility];  Service: Plastics;  Laterality: Bilateral;  . DILATION AND  CURETTAGE OF UTERUS    . JOINT REPLACEMENT    . MASTECTOMY W/ SENTINEL NODE BIOPSY Bilateral 08/13/2016   Procedure: BILATERAL TOTAL MASTECTOMY WITH RIGHT AXILLARY SENTINEL LYMPH NODE BIOPSY;  Surgeon: [name], MD;  Location: [facility];  Service: General;  Laterality: Bilateral;  . TOTAL HIP ARTHROPLASTY Right 01/07/2016   Procedure: TOTAL HIP ARTHROPLASTY ANTERIOR APPROACH;  Surgeon: Gaynelle Arabian, MD;  Location: [facility];  Service: Orthopedics;  Laterality: Right;    SOCIAL HISTORY: Social History   Social History  . Marital status: Married    Spouse name: N/A  . Number of children: 1  . Years of education: N/A   Occupational History  . Not on file.   Social History Main Topics  . Smoking status: Never Smoker  . Smokeless tobacco: Never Used  . Alcohol use No  . Drug use: No  . Sexual activity: Yes   Other Topics Concern  . Not on file   Social History Narrative  .  No narrative on file    FAMILY HISTORY: Family History  Problem Relation Age of Onset  . Arthritis Mother   . Asthma Mother   . Diabetes Mother   . Hyperlipidemia Mother   . Hypertension Mother   . Arthritis Father   . Asthma Father   . Stroke Father   . Hyperlipidemia Father   . Early death Brother 8    Muscular Dystrophy  . Birth defects Brother   . Cancer Maternal Uncle     possibly stomach  . Mental retardation Maternal Uncle   . Cancer Paternal Grandmother 9    breast cancer   . Cancer Cousin 55    paternal cousin's daughter with breast cancer  . Colon cancer Maternal Aunt     dx in her 43s  . Leukemia Paternal Uncle   . Lymphoma Paternal Uncle   . Down syndrome Paternal Uncle     ALLERGIES:  is allergic to azithromycin; biaxin [clarithromycin]; codeine; and macrolides and ketolides.  MEDICATIONS:  Current Outpatient Prescriptions  Medication Sig Dispense Refill  . Acetaminophen (TYLENOL ARTHRITIS PAIN PO) Take by mouth 2 (two) times daily.    Marland Kitchen albuterol  (PROVENTIL HFA;VENTOLIN HFA) 108 (90 Base) MCG/ACT inhaler Inhale 2 puffs into the lungs every 6 (six) hours as needed for wheezing or shortness of breath.    . benazepril (LOTENSIN) 20 MG tablet TAKE (1) TABLET BY MOUTH TWICE DAILY. 60 tablet 11  . cyclobenzaprine (FLEXERIL) 10 MG tablet Take 1 tablet (10 mg total) by mouth 3 (three) times daily as needed for muscle spasms. 30 tablet 0  . escitalopram (LEXAPRO) 20 MG tablet Take 1 tablet (20 mg total) by mouth daily. 30 tablet 11  . exemestane (AROMASIN) 25 MG tablet Take 1 tablet (25 mg total) by mouth daily after breakfast. 30 tablet 2  . fexofenadine (ALLEGRA) 180 MG tablet Take 180 mg by mouth daily.    . metFORMIN (GLUCOPHAGE) 500 MG tablet Take 1 tablet (500 mg total) by mouth 2 (two) times daily with a meal. 60 tablet 5  . omeprazole (PRILOSEC) 40 MG capsule Take 40 mg by mouth daily.    Marland Kitchen oxyCODONE (ROXICODONE) 5 MG immediate release tablet Take 1-2 tablets (5-10 mg total) by mouth every 4 (four) hours as needed for severe pain. (Patient not taking: Reported on 09/10/2016) 40 tablet 0   No current facility-administered medications for this visit.     REVIEW OF SYSTEMS:   Constitutional: Denies fevers, chills or abnormal night sweats Eyes: Denies blurriness of vision, double vision or watery eyes Ears, nose, mouth, throat, and face: Denies mucositis or sore throat Respiratory: Denies cough, dyspnea or wheezes Cardiovascular: Denies palpitation, chest discomfort or lower extremity swelling Gastrointestinal:  Denies nausea, heartburn or change in bowel habits Skin: Denies abnormal skin rashes Lymphatics: Denies new lymphadenopathy or easy bruising Neurological:Denies numbness, tingling or new weaknesses Behavioral/Psych: Mood is stable, no new changes  All other systems were reviewed with the patient and are negative.  PHYSICAL EXAMINATION: ECOG PERFORMANCE STATUS: 0 - Asymptomatic  Vitals:   09/10/16 1318  BP: (!) 147/73  Pulse:  92  Resp: 17  Temp: 98.6 F (37 C)   Filed Weights   09/10/16 1318  Weight: 183 lb 3.2 oz (83.1 kg)    GENERAL:alert, no distress and comfortable SKIN: skin color, texture, turgor are normal, no rashes or significant lesions EYES: normal, conjunctiva are pink and non-injected, sclera clear OROPHARYNX:no exudate, no erythema and lips, buccal mucosa, and  tongue normal. NECK: supple, thyroid normal size, non-tender, without nodularity LYMPH:  no palpable lymphadenopathy in the cervical, axillary or inguinal LUNGS: clear to auscultation and percussion with normal breathing effort HEART: regular rate & rhythm and no murmurs and no lower extremity edema ABDOMEN:abdomen soft, non-tender and normal bowel sounds Musculoskeletal:no cyanosis of digits and no clubbing  PSYCH: alert & oriented x 3 with fluent speech NEURO: no focal motor/sensory deficits  Breasts: (+) Bilateral mastectomy with tissue expander. Right side covered in gauze. Left incision well healed.  LABORATORY DATA:  I have reviewed the data as listed CBC Latest Ref Rng & Units 06/23/2016 05/11/2016 01/09/2016  WBC 3.9 - 10.3 10e3/uL 4.9 4.4 17.3(H)  Hemoglobin 11.6 - 15.9 g/dL 12.8 12.9 12.1  Hematocrit 34.8 - 46.6 % 39.2 39.2 36.3  Platelets 145 - 400 10e3/uL 250 299 267   CMP Latest Ref Rng & Units 08/12/2016 06/23/2016 05/11/2016  Glucose 65 - 99 mg/dL 96 253(H) 131(H)  BUN 6 - 20 mg/dL 10 12.3 11  Creatinine 0.44 - 1.00 mg/dL 0.70 0.8 0.68  Sodium 135 - 145 mmol/L 136 139 140  Potassium 3.5 - 5.1 mmol/L 3.9 3.8 4.5  Chloride 101 - 111 mmol/L 103 - 104  CO2 22 - 32 mmol/L _0 Calcium 8.9 - 10.3 mg/dL 9.3 9.4 8.9  Total Protein 6.4 - 8.3 g/dL - 6.8 6.6  Total Bilirubin 0.20 - 1.20 mg/dL - 0.59 0.3  Alkaline Phos 40 - 150 U/L - 110 91  AST 5 - 34 U/L - 18 21  ALT 0 - 55 U/L - 19 26   PATHOLOGY REPORT  ONCOTYPE DX 08/13/2016   Diagnosis 08/13/2016 1. Breast, simple mastectomy, Left - LOBULAR NEOPLASIA (LOBULAR  CARCINOMA IN SITU). - ATYPICAL DUCTAL HYPERPLASIA. - FLAT EPITHELIAL ATYPIA. - FIBROCYSTIC CHANGES WITH ADENOSIS AND CALCIFICATIONS. 2. Breast, simple mastectomy, Right - INVASIVE LOBULAR CARCINOMA, GRADE II/III, MULTIPLE FOCI, SPANNING 1.5 CM, 1.5 CM AND 1.0 CM. - LOBULAR CARCINOMA IN SITU. - THE SURGICAL RESECTION MARGINS ARE NEGATIVE FOR INVASIVE CARCINOMA. - SEE ONCOLOGY TABLE BELOW. 3. Lymph node, sentinel, biopsy, Right - THERE IS NO EVIDENCE OF CARCINOMA IN 1 OF 1 LYMPH NODE (0/1). - SEE COMMENT. Microscopic Comment 2. BREAST, INVASIVE TUMOR, WITH LYMPH NODES PRESENT Specimen, including laterality and lymph node sampling (sentinel, non-sentinel): Right breast and right axillary lymph node. Procedure: Simple mastectomy and axillary lymph node resection x1. Histologic type: Lobular Grade: II Tubule formation: 3 Nuclear pleomorphism: 2 Mitotic:2 Tumor size (gross measurements): 1.5 cm, 1.5 cm, and 1.0 cm Margins: Greater than 0.2 cm to all margins. Lymphovascular invasion: Not identified. Ductal carcinoma in situ: Not identified. Tumor focality: At least three foci. Extent of tumor: Confined to breast parenchyma. Lymph nodes: 1 of 3 FINAL for DEARI, SESSLER [phone]) Microscopic Comment(continued) Examined: 1 Sentinel 0 Non-sentinel 1 Total Lymph nodes with metastasis: 0 Breast prognostic profile: Estrogen receptor: Positive. Progesterone receptor: Positive. Her 2 neu: Negative. Ki-67: 5% - 10% Non-neoplastic breast: Fibrocystic changes with adenosis and calcifications. TNM: mpT1c, pN0 Comments: There are three nodules of similar appearing invasive lobular carcinoma with associated lobular carcinoma in situ. The carcinoma cells are negative for E-cadherin, supporting a lobular phenotype. 3. Immunohistochemical stains performed on two blocks of part 3 fail to highlight the presence of cytokeratin positive tumor cells. (JBK:ecj 08/17/2016) Enid Cutter  MD Pathologist, Electronic Signature (Case signed 08/17/2016)  Genetic Testing 06/28/2016 Genetic testing did identify a variant called, MSH2 c.2650A>T.  Diagnosis 06/16/2016 1.  Breast, right, needle core biopsy, 9 o'clock - INVASIVE LOBULAR CARCINOMA. - LOBULAR CARCINOMA IN SITU. - SEE COMMENT. 2. Breast, right, needle core biopsy, 6:30 o'clock - INVASIVE LOBULAR CARCINOMA. - LOBULAR CARCINOMA IN SITU. - SEE COMMENT. 3. Breast, right, needle core biopsy, 6 o'clock - INVASIVE LOBULAR CARCINOMA. - LOBULAR CARCINOMA IN SITU. - SEE COMMENT. Microscopic Comment 1. Smooth muscle myosin, p63 and calponin stains are performed on the first specimen (right 9 o'clock needle core biopsy). The stains confirm the presence of both invasive and in situ carcinoma. An E-cadherin immunohistochemical stain is also performed. This additional stain is negative in both the invasive and in situ components confirming a lobular phenotype. Although definitive grading of breast carcinoma is best done on excision, the features of the invasive tumor from the right 9 o'clock breast biopsy are compatible with a grade 2 breast carcinoma. Breast prognostic markers will be performed and reported in an addendum. Findings are called to the [facility] on 06/18/16. Dr. Vicente Males has seen this specimen in consultation with agreement. 2. Smooth muscle myosin, p63 and calponin stains are performed on the second specimen (right 6:30 o'clock needle core biopsy). The stains confirm the presence of both invasive and in situ carcinoma. An E-cadherin immunohistochemical stain is also performed. This additional stain is negative in both the invasive and in situ components confirming a lobular phenotype. Although definitive grading of breast carcinoma is best done on excision, the features of the invasive tumor from the right 6:30 o'clock biopsy are compatible with a grade 2 breast carcinoma. Breast prognostic  markers will be performed and reported in an addendum. Findings are called to the [facility] on 06/18/16. Dr. Vicente Males has seen this specimen in consultation with agreement. 3. An E-cadherin immunohistochemical stain is performed on the third specimen (right 6 o'clock breast biopsy). The stain fails to demonstrate positivity in the invasive or in situ component confirming the lobular nature of both. Although definitive grading of breast carcinoma is best done on excision, the features of the invasive tumor from the right 6 o'clock breast biopsy are compatible with a grade 2 breast carcinoma. Breast prognostic markers will be performed and reported in an addendum. Findings are called to the [facility] on 06/18/16. Dr. Vicente Males has seen this specimen in consultation with agreement. (RAH:gt, 06/18/16) Willeen Niece MD  1. Results: IMMUNOHISTOCHEMICAL AND MORPHOMETRIC ANALYSIS PERFORMED MANUALLY Estrogen Receptor: 95%, POSITIVE, STRONG STAINING INTENSITY Progesterone Receptor: 95%, POSITIVE, STRONG STAINING INTENSITY Proliferation Marker Ki67: 10%  Results: HER2 - NEGATIVE RATIO OF HER2/CEP17 SIGNALS 1.16 AVERAGE HER2 COPY NUMBER PER CELL 2.20  2. PROGNOSTIC INDICATORS Insufficient tissue   3. PROGNOSTIC INDICATORS Results: IMMUNOHISTOCHEMICAL AND MORPHOMETRIC ANALYSIS PERFORMED MANUALLY Estrogen Receptor: 100%, POSITIVE, STRONG STAINING INTENSITY Progesterone Receptor: 100%, POSITIVE, STRONG STAINING INTENSITY Proliferation Marker Ki67: 5% Results: HER2 - NEGATIVE RATIO OF HER2/CEP17 SIGNALS 1.19 AVERAGE HER2 COPY NUMBER PER CELL 3.15   RADIOGRAPHIC STUDIES: I have personally reviewed the radiological images as listed and agreed with the findings in the report. No results found.  ASSESSMENT & PLAN: 61 y.o. Caucasian postmenopausal woman, presented image discovered multifocal right breast masses.  1. Malignant neoplasm of lower-outer  quadrant of right female breast, invasive lobular carcinoma and LCIS, mpT1cN0M0, stage IA, G2, ER+/PR+/HER2- -I reviewed her surgical pathology findings with patient in details. Her surgical margins was negative, 1 sentinel lymph nodes was negative. She had stage I disease. -Oncotype DX score was 11 with the 10  year risk of distant recurrence with tamoxifen alone is 7%. This is at low risk. Therefore I do not recommend chemotherapy.  -Giving the strong ER and PR expression in her postmenopausal status, I recommend adjuvant endocrine therapy with aromatase inhibitor for a total of 5-10 years to reduce the risk of cancer recurrence. Potential benefits and side effects were discussed with patient and she is interested. Due to her lobular histology, I would recommend a total of 10 years if she is able tolerate. -She was also seen by radiation oncologist Dr. Isidore Moos. The patient's surgical sentinel lymph nodes were negative and she would not need post mastectomy radiation. -We also discussed the breast cancer surveillance after her surgery. She will continue self exam and a routine office visit with lab and exam with [place]. Mammograms not needed since she had bilateral mammograms. -I encouraged her to have healthy diet and exercise regularly. -I recommended Exemestane (Aromasin) 25 mg daily, to start on 09/25/16. The potential benefit and side effects, which includes but not limited to, hot flash, skin and vaginal dryness, metabolic changes (increased blood glucose, cholesterol, weight, etc.), slightly in increased risk of cardiovascular disease, cataracts, muscular and joint discomfort, osteopenia and osteoporosis, etc, were discussed with her in great details. She agrees to proceed.   2. Genetics -She has 2 family members who had breast cancer at a young age, she will qualify for genetic testing, we'll refer her. -Blood sample sent on 06/28/16. -Genetic testing did identify a VUS in MSH2, c.2650A>T.  3. DM,  type 2 -She used to be on metformin, off now. Her random blood glucose was 253 on 06/23/2016, I strongly encouraged her to follow-up with her primary care physician, and consider restarting metformin.  4. Arthritis -She had a total right hip replacement, has mild arthralgia in the left hip, manageable. -We discussed that adjuvant hormonal therapy could make this worse.  PLAN -The patient will begin Exemestane on 09/25/16. -Continue follow up with plastic surgery. -Lab and f/u in 2 months.  Orders Placed This Encounter  Procedures  . CBC with Differential    Standing Status:   Standing    Number of Occurrences:   30    Standing Expiration Date:   09/10/2026  . Comprehensive metabolic panel    Standing Status:   Standing    Number of Occurrences:   30    Standing Expiration Date:   09/10/2026    All questions were answered. The patient knows to call the clinic with any problems, questions or concerns.  I spent 25 minutes counseling the patient face to face. The total time spent in the appointment was 30 minutes and more than 50% was on counseling.     [name], MD 09/10/2016 6:07 PM  This document serves as a record of services personally performed by [name], MD. It was created on her behalf by Darcus Austin, a trained medical scribe. The creation of this record is based on the scribe's personal observations and the provider's statements to them. This document has been checked and approved by the attending provider.

## 2016-09-10 ENCOUNTER — Ambulatory Visit (HOSPITAL_BASED_OUTPATIENT_CLINIC_OR_DEPARTMENT_OTHER): Payer: BC Managed Care – PPO | Admitting: Hematology

## 2016-09-10 ENCOUNTER — Telehealth: Payer: Self-pay | Admitting: Hematology

## 2016-09-10 ENCOUNTER — Encounter: Payer: Self-pay | Admitting: Hematology

## 2016-09-10 VITALS — BP 147/73 | HR 92 | Temp 98.6°F | Resp 17 | Ht 63.0 in | Wt 183.2 lb

## 2016-09-10 DIAGNOSIS — C50511 Malignant neoplasm of lower-outer quadrant of right female breast: Secondary | ICD-10-CM

## 2016-09-10 DIAGNOSIS — Z170 Estrogen receptor positive status [ER+]: Secondary | ICD-10-CM | POA: Diagnosis not present

## 2016-09-10 DIAGNOSIS — E119 Type 2 diabetes mellitus without complications: Secondary | ICD-10-CM | POA: Diagnosis not present

## 2016-09-10 MED ORDER — EXEMESTANE 25 MG PO TABS: 25.0000 mg | ORAL_TABLET | Freq: Every day | ORAL | 2 refills | Status: DC

## 2016-09-10 NOTE — Telephone Encounter (Signed)
Appointments scheduled per 09/10/16 los. Patient was given a copy of the AVS report and appointment schedule, per 09/10/16 los. °

## 2016-09-22 ENCOUNTER — Ambulatory Visit: Payer: BC Managed Care – PPO | Admitting: Physical Therapy

## 2016-09-24 ENCOUNTER — Ambulatory Visit: Payer: BC Managed Care – PPO | Attending: Plastic Surgery | Admitting: Physical Therapy

## 2016-09-24 DIAGNOSIS — M25611 Stiffness of right shoulder, not elsewhere classified: Secondary | ICD-10-CM | POA: Insufficient documentation

## 2016-09-24 DIAGNOSIS — M6281 Muscle weakness (generalized): Secondary | ICD-10-CM | POA: Diagnosis present

## 2016-09-24 DIAGNOSIS — M25612 Stiffness of left shoulder, not elsewhere classified: Secondary | ICD-10-CM | POA: Insufficient documentation

## 2016-09-24 NOTE — Therapy (Signed)
[place], [place], [address] Phone: [phone]   Fax:  [phone]  Physical Therapy Evaluation  Patient Details  Name: Carolyn Glass MRN: [id number] Date of Birth: 1956/05/18 Referring Provider: Dr. Irene Limbo  Encounter Date: 09/24/2016      PT End of Session - 09/24/16 1219    Visit Number 1   Number of Visits 9   Date for PT Re-Evaluation 10/25/16   PT Start Time 1106   PT Stop Time 1155   PT Time Calculation (min) 49 min   Activity Tolerance Patient tolerated treatment well   Behavior During Therapy [facility] for tasks assessed/performed      Past Medical History:  Diagnosis Date  . Anxiety   . Arthritis   . Asthma    allergy and exercise induced asthma   . Depression   . Diabetes mellitus without complication ([place])   . Family history of breast cancer   . Family history of colon cancer   . Family history of stomach cancer   . GERD (gastroesophageal reflux disease)   . Hypertension   . Malignant neoplasm of lower-outer quadrant of right female breast ([place]) 06/21/2016  . Seasonal allergies     Past Surgical History:  Procedure Laterality Date  . BREAST LUMPECTOMY     right   . BREAST RECONSTRUCTION WITH PLACEMENT OF TISSUE EXPANDER AND FLEX HD (ACELLULAR HYDRATED DERMIS) Bilateral 08/13/2016   Procedure: BREAST RECONSTRUCTION WITH PLACEMENT OF TISSUE EXPANDER AND ALLODERM;  Surgeon: Irene Limbo, MD;  Location: [place];  Service: Plastics;  Laterality: Bilateral;  . DILATION AND CURETTAGE OF UTERUS    . JOINT REPLACEMENT    . MASTECTOMY W/ SENTINEL NODE BIOPSY Bilateral 08/13/2016   Procedure: BILATERAL TOTAL MASTECTOMY WITH RIGHT AXILLARY SENTINEL LYMPH NODE BIOPSY;  Surgeon: [name], MD;  Location: [place];  Service: General;  Laterality: Bilateral;  . TOTAL HIP ARTHROPLASTY Right 01/07/2016   Procedure: TOTAL HIP ARTHROPLASTY  ANTERIOR APPROACH;  Surgeon: Gaynelle Arabian, MD;  [facility];  Service: Orthopedics;  Laterality: Right;    There were no vitals filed for this visit.       Subjective Assessment - 09/24/16 1111    Subjective "I had breast cancer and double mastectomy and I'm assuming I'm here to help work with the muscles in my arms--for like overhead and straighten up."   Pertinent History Right breast cancer diagnosed 06/18/16; went to [facility] on 06/23/16 and next day met with Dr. Iran Planas.  Had a second scan to rule out liver issue that showed up on breast MRI.  Pt. had bilateral mastectomy 08/13/16 with immediate reconstruction/expander placement.  Just started aromosin today.  No chemo nor radiation.  One lymph node removed on right side, which was negative.  Has had three fills of expanders; goes back 10/13/16 to discuss her choice of implant, and will decide then when to have next surgery.  Diabetes and HTN, controlled with meds.  Had right hip replacement in May 2017, which is doing well, and eventually she will need the left done.   Limitations Other (comment)  reaching   Patient Stated Goals get my strength back   Currently in Pain? No/denies            [facility] PT Assessment - 09/24/16 0001      Assessment   Medical Diagnosis right breast cancer, s/p bilateral mastectomies with immediate expander placement   Referring Provider Dr. Irene Limbo  Onset Date/Surgical Date 08/13/16   Hand Dominance Right     Precautions   Precautions Other (comment)   Precaution Comments cancer precautions     Restrictions   Weight Bearing Restrictions No     Balance Screen   Has the patient fallen in the past 6 months No   Has the patient had a decrease in activity level because of a fear of falling?  No   Is the patient reluctant to leave their home because of a fear of falling?  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 Iran Planas recently, but is doing well   Quick DASH  25     ROM / Strength   AROM / PROM / Strength AROM;PROM     AROM   AROM Assessment Site Shoulder   Right/Left Shoulder Right;Left   Right Shoulder Flexion 129 Degrees   Right Shoulder ABduction 104 Degrees   Right Shoulder Internal Rotation --  WFL, supine   Right Shoulder External Rotation 50 Degrees  supine   Left Shoulder Flexion 130 Degrees   Left Shoulder ABduction 102 Degrees  slight scaption   Left Shoulder Internal Rotation --  WFL, supine   Left Shoulder External Rotation 45 Degrees  supine     PROM   PROM Assessment Site Shoulder   Right/Left Shoulder Right;Left   Right Shoulder Flexion 135 Degrees   Left Shoulder Flexion 140 Degrees           LYMPHEDEMA/ONCOLOGY QUESTIONNAIRE - 09/24/16 1123      Type   Cancer Type right breast cancer with bilateral mastectomies     Surgeries   Mastectomy Date 08/13/16   Number Lymph Nodes Removed 1     Treatment   Active Chemotherapy Treatment No   Past Chemotherapy Treatment No   Active Radiation Treatment No   Past Radiation Treatment No   Current Hormone Treatment Yes   Date 09/24/16   Drug Name exemestane/aromasin     Lymphedema Assessments   Lymphedema Assessments Upper extremities     Right Upper Extremity  Lymphedema   10 cm Proximal to Olecranon Process 31.5 cm   Olecranon Process 27.7 cm   10 cm Proximal to Ulnar Styloid Process 26.8 cm   Just Proximal to Ulnar Styloid Process 18 cm   Across Hand at [employer] 20.2 cm   At [place] of 2nd Digit 6.8 cm     Left Upper Extremity Lymphedema   10 cm Proximal to Olecranon Process 30.5 cm   Olecranon Process 27.9 cm   10 cm Proximal to Ulnar Styloid Process 26.3 cm   Just Proximal to Ulnar Styloid Process 18.3 cm   Across Hand at [employer] 20  cm   At [facility] of 2nd Digit 7 cm           Quick Dash - 09/24/16 0001    Open a tight or new jar Moderate difficulty   Do heavy household chores (wash walls, wash floors) Moderate difficulty   Carry a shopping bag or briefcase Mild difficulty   Wash your back Mild difficulty   Use a knife to cut food No difficulty   Recreational activities in which you take some force or impact through your arm, shoulder, or hand (golf, hammering, tennis) Moderate difficulty   During the past week, to what extent has your arm, shoulder or hand problem interfered with your normal social activities with family, friends, neighbors, or groups? Slightly   During the past week, to what extent has your arm, shoulder or hand problem limited your work or other regular daily activities Slightly   Arm, shoulder, or hand pain. Mild   Tingling (pins and needles) in your arm, shoulder, or hand None   Difficulty Sleeping No difficulty   DASH Score 25 %                     PT Education - 09/24/16 1218    Education provided Yes   Education Details reminded to do exercises she learned at [facility] (clasped hands shoulder flexion, wall walking for abduction, shoulder er with scapular retraction   Person(s) Educated Patient   Methods Verbal cues   Comprehension Verbalized understanding                [place] - 09/24/16 1225      CC Long Term Goal  #1   Title Quick  DASH score will be redued to 15 or less.   Baseline 25 at eval   Time 4   Period Weeks   Status New     CC Long Term Goal  #2   Title Bilateral shoulder active flexion to 150 degrees.   Time 4   Period Weeks   Status New     CC Long Term Goal  #3   Title Bilateral shoulder active abduction 160 degrees.   Time 4   Period Weeks   Status New     CC Long Term Goal  #4   Title Bilateral shoulder active external rotation at least 80 degrees   Time 4   Period Weeks   Status New     CC Long Term Goal  #5   Title knowledgeable about lymphedema risk reduction practices   Baseline she was told today at eval that her risk is extremely small   Time 4   Period Weeks   Status New     CC Long Term Goal  #6   Title bilateral shoulder strength grossly 4/5 or better   Time 4   Period Weeks   Status New     CC Long Term Goal  #7   Title independent with HEP for shoulder ROM and strengthening   Time 4   Period Weeks   Status New     Additional Goals   Additional Goals Yes            Plan - 09/24/16 1219    Clinical Impression Statement Patient diagnosed with right breast cancer who underwent bilateral mastectomies, the left prophylactic.  She had one lymph node removed on right.  She had immediate expander placement at the time of mastectomy.  She has limited active  and passive shoulder ROM.  She is at very low risk for lymphedema on the right only, but arm circumference measurements were taken today.  Arm strength is decreased.  Eval is moderate with comorbidity of diabetes and evolving with expanders still being filled.   Rehab Potential Excellent   Clinical Impairments Affecting Rehab Potential still having expander fills   PT Frequency 2x / week   PT Duration 4 weeks  prn   PT Treatment/Interventions ADLs/Self Care Home Management;Therapeutic activities;Therapeutic exercise;Patient/family education;Manual techniques;Passive range of motion   PT Next Visit Plan Begin  P/AA/A/ROM to both shoulders; progress HEP.  Later include strengthening and lymphedema risk reduction education.   PT Home Exercise Plan breast multi-disciplinary clinic stretches right now   Recommended Other Services She plans to pursue doing PT in [place] for convenience.   Consulted and Agree with Plan of Care Patient      Patient will benefit from skilled therapeutic intervention in order to improve the following deficits and impairments:  Decreased range of motion, Decreased strength, Impaired UE functional use  Visit Diagnosis: Stiffness of right shoulder, not elsewhere classified - Plan: PT plan of care cert/re-cert  Stiffness of left shoulder, not elsewhere classified - Plan: PT plan of care cert/re-cert  Muscle weakness (generalized) - Plan: PT plan of care cert/re-cert     Problem List Patient Active Problem List   Diagnosis Date Noted  . Genetic testing 07/09/2016  . Family history of breast cancer   . Family history of colon cancer   . Family history of stomach cancer   . Malignant neoplasm of lower-outer quadrant of right female breast ([place]) 06/21/2016  . OA (osteoarthritis) of hip 01/07/2016    Cloteal Isaacson 09/24/2016, 12:32 PM  [place], [place], [address] Phone: [phone]   Fax:  [phone]  Name: ARRAYAH CONNORS MRN: [id number] Date of Birth: 27-Jun-1956   Serafina Royals, PT 09/24/16 12:32 PM

## 2016-09-27 ENCOUNTER — Telehealth (HOSPITAL_COMMUNITY): Payer: Self-pay | Admitting: Physical Therapy

## 2016-09-27 NOTE — Telephone Encounter (Signed)
Cancer pt has as already been evaluated at [facility] will be treated her for PT -Right Shoulder ROM per [facility].

## 2016-09-29 ENCOUNTER — Ambulatory Visit (HOSPITAL_COMMUNITY): Payer: BC Managed Care – PPO | Attending: Family Medicine | Admitting: Physical Therapy

## 2016-09-29 DIAGNOSIS — M25612 Stiffness of left shoulder, not elsewhere classified: Secondary | ICD-10-CM | POA: Diagnosis present

## 2016-09-29 DIAGNOSIS — M6281 Muscle weakness (generalized): Secondary | ICD-10-CM

## 2016-09-29 DIAGNOSIS — C50511 Malignant neoplasm of lower-outer quadrant of right female breast: Secondary | ICD-10-CM | POA: Diagnosis present

## 2016-09-29 DIAGNOSIS — M25611 Stiffness of right shoulder, not elsewhere classified: Secondary | ICD-10-CM | POA: Insufficient documentation

## 2016-09-29 DIAGNOSIS — R293 Abnormal posture: Secondary | ICD-10-CM | POA: Insufficient documentation

## 2016-09-29 DIAGNOSIS — Z170 Estrogen receptor positive status [ER+]: Secondary | ICD-10-CM | POA: Diagnosis present

## 2016-09-29 NOTE — Therapy (Signed)
[place], [place], [address] Phone: [phone]   Fax:  [phone]  Physical Therapy Treatment  Patient Details  Name: Carolyn Glass MRN: [id number] Date of Birth: 1955-11-13 Referring Provider: Dr. Irene Limbo  Encounter Date: 09/29/2016      PT End of Session - 09/29/16 1329    Visit Number 2   Number of Visits 9   Date for PT Re-Evaluation 10/25/16   PT Start Time 1122   PT Stop Time 1200   PT Time Calculation (min) 38 min   Activity Tolerance Patient tolerated treatment well   Behavior During Therapy [facility] for tasks assessed/performed      Past Medical History:  Diagnosis Date  . Anxiety   . Arthritis   . Asthma    allergy and exercise induced asthma   . Depression   . Diabetes mellitus without complication ([place])   . Family history of breast cancer   . Family history of colon cancer   . Family history of stomach cancer   . GERD (gastroesophageal reflux disease)   . Hypertension   . Malignant neoplasm of lower-outer quadrant of right female breast ([place]) 06/21/2016  . Seasonal allergies     Past Surgical History:  Procedure Laterality Date  . BREAST LUMPECTOMY     right   . BREAST RECONSTRUCTION WITH PLACEMENT OF TISSUE EXPANDER AND FLEX HD (ACELLULAR HYDRATED DERMIS) Bilateral 08/13/2016   Procedure: BREAST RECONSTRUCTION WITH PLACEMENT OF TISSUE EXPANDER AND ALLODERM;  Surgeon: Irene Limbo, MD;  Location: [place];  Service: Plastics;  Laterality: Bilateral;  . DILATION AND CURETTAGE OF UTERUS    . JOINT REPLACEMENT    . MASTECTOMY W/ SENTINEL NODE BIOPSY Bilateral 08/13/2016   Procedure: BILATERAL TOTAL MASTECTOMY WITH RIGHT AXILLARY SENTINEL LYMPH NODE BIOPSY;  Surgeon: [name], MD;  Location: [place];  Service: General;  Laterality: Bilateral;  . TOTAL HIP ARTHROPLASTY Right 01/07/2016   Procedure: TOTAL HIP ARTHROPLASTY ANTERIOR APPROACH;   Surgeon: Gaynelle Arabian, MD;  [facility];  Service: Orthopedics;  Laterality: Right;    There were no vitals filed for this visit.      Subjective Assessment - 09/29/16 1324    Subjective pt received her evaluation last week at [facility] outpatient rehab.  It was determined there she had no lymphedema issues, only ROM issues in bilateral shoulders.  Pt reports no pain today, only tightness in bilateral pectoral regions from her expanders.     Pertinent History Right breast cancer diagnosed 06/18/16; went to [facility] on 06/23/16 and next day met with Dr. Iran Planas.  Had a second scan to rule out liver issue that showed up on breast MRI.  Pt. had bilateral mastectomy 08/13/16 with immediate reconstruction/expander placement.  Just started aromosin today.  No chemo nor radiation.  One lymph node removed on right side, which was negative.  Has had three fills of expanders; goes back 10/13/16 to discuss her choice of implant, and will decide then when to have next surgery.  Diabetes and HTN, controlled with meds.  Had right hip replacement in May 2017, which is doing well, and eventually she will need the left done.   Currently in Pain? No/denies                         [facility] Adult PT Treatment/Exercise - 09/29/16 0001      Shoulder Exercises: Supine   Flexion AAROM;Both;12  reps   Flexion Limitations 1# cane   ABduction AAROM;Both;10 reps   ABduction Limitations 1# cane   Other Supine Exercises PROM all motions bilaterally   Other Supine Exercises stargazer stretch 3X20"     Shoulder Exercises: Stretch   Corner Stretch 3 reps;20 seconds   External Rotation Stretch 2 reps;20 seconds  shown in doorway   Wall Stretch - Flexion 5 reps   Wall Stretch - ABduction 5 reps     Manual Therapy   Manual Therapy Myofascial release   Manual therapy comments completed seperate from all other skilled interventions   Myofascial Release bilateral pec and anterior shoulder fascia                 PT Education - 09/29/16 1329    Education provided Yes   Education Details reviewed evaluation goals and HEP.  Pt educated in depth on lymphedema (causes/treatments) and precautions   Person(s) Educated Patient   Methods Explanation;Demonstration;Tactile cues;Verbal cues;Handout   Comprehension Verbalized understanding;Returned demonstration;Verbal cues required;Tactile cues required;Need further instruction                Long Term Clinic Goals - 09/24/16 1225      CC Long Term Goal  #1   Title Quick DASH score will be redued to 15 or less.   Baseline 25 at eval   Time 4   Period Weeks   Status New     CC Long Term Goal  #2   Title Bilateral shoulder active flexion to 150 degrees.   Time 4   Period Weeks   Status New     CC Long Term Goal  #3   Title Bilateral shoulder active abduction 160 degrees.   Time 4   Period Weeks   Status New     CC Long Term Goal  #4   Title Bilateral shoulder active external rotation at least 80 degrees   Time 4   Period Weeks   Status New     CC Long Term Goal  #5   Title knowledgeable about lymphedema risk reduction practices   Baseline she was told today at eval that her risk is extremely small   Time 4   Period Weeks   Status New     CC Long Term Goal  #6   Title bilateral shoulder strength grossly 4/5 or better   Time 4   Period Weeks   Status New     CC Long Term Goal  #7   Title independent with HEP for shoulder ROM and strengthening   Time 4   Period Weeks   Status New     Additional Goals   Additional Goals Yes            Plan - 09/29/16 1331    Clinical Impression Statement Reveiwed evaluation and long term goals for therapy.  Began A/P/AAROM for bilateral shoulders.  Mostly limited in flexion and abduction wtih Rt being more restricted than Lt.  Pt presently without signs/symptoms of lymphedema, however educated on warning signs, prevention and general causes for diagnosis.  Pt  with majority of tightness in bilateral pecs where expanders are placed and this is the area of most discomfort with ROM actvities for pateint.  Completed myofascial techniques in these areas and instructed with self stretching to help with this.  Pt without questions or concerns at end of session.     Rehab Potential Excellent   Clinical Impairments Affecting Rehab Potential still having expander  fills   PT Frequency 2x / week   PT Duration 4 weeks  prn   PT Treatment/Interventions ADLs/Self Care Home Management;Therapeutic activities;Therapeutic exercise;Patient/family education;Manual techniques;Passive range of motion   PT Next Visit Plan Establish ROM first then begin strengthening.  Begin pulleys next session.   PT Home Exercise Plan breast multi-disciplinary clinic stretches right now   Consulted and Agree with Plan of Care Patient      Patient will benefit from skilled therapeutic intervention in order to improve the following deficits and impairments:  Decreased range of motion, Decreased strength, Impaired UE functional use  Visit Diagnosis: Stiffness of right shoulder, not elsewhere classified  Stiffness of left shoulder, not elsewhere classified  Muscle weakness (generalized)  Abnormal posture  Carcinoma of lower-outer quadrant of right breast in female, estrogen receptor positive ([place])     Problem List Patient Active Problem List   Diagnosis Date Noted  . Genetic testing 07/09/2016  . Family history of breast cancer   . Family history of colon cancer   . Family history of stomach cancer   . Malignant neoplasm of lower-outer quadrant of right female breast ([place]) 06/21/2016  . OA (osteoarthritis) of hip 01/07/2016    Teena Irani, PTA/CLT [phone]  09/29/2016, 1:37 PM  [place] [address] [place], [place], [address] Phone: [phone]   Fax:  [phone]  Name: Carolyn Glass MRN: [id number] Date of  Birth: 30-Jun-1956

## 2016-09-30 ENCOUNTER — Ambulatory Visit (HOSPITAL_COMMUNITY): Payer: BC Managed Care – PPO | Admitting: Physical Therapy

## 2016-09-30 DIAGNOSIS — M25612 Stiffness of left shoulder, not elsewhere classified: Secondary | ICD-10-CM

## 2016-09-30 DIAGNOSIS — M25611 Stiffness of right shoulder, not elsewhere classified: Secondary | ICD-10-CM

## 2016-09-30 DIAGNOSIS — M6281 Muscle weakness (generalized): Secondary | ICD-10-CM

## 2016-09-30 NOTE — Therapy (Signed)
[place], [place], [address] Phone: [phone]   Fax:  [phone]  Physical Therapy Treatment  Patient Details  Name: Carolyn Glass MRN: [id number] Date of Birth: 1955/10/07 Referring Provider: Dr. Irene Limbo  Encounter Date: 09/30/2016      PT End of Session - 09/30/16 1108    Visit Number 3   Number of Visits 9   Date for PT Re-Evaluation 10/25/16   Authorization - Visit Number 3   Authorization - Number of Visits 9   PT Start Time 1034   PT Stop Time 1112   PT Time Calculation (min) 38 min   Activity Tolerance Patient tolerated treatment well   Behavior During Therapy [facility] for tasks assessed/performed      Past Medical History:  Diagnosis Date  . Anxiety   . Arthritis   . Asthma    allergy and exercise induced asthma   . Depression   . Diabetes mellitus without complication ([place])   . Family history of breast cancer   . Family history of colon cancer   . Family history of stomach cancer   . GERD (gastroesophageal reflux disease)   . Hypertension   . Malignant neoplasm of lower-outer quadrant of right female breast ([place]) 06/21/2016  . Seasonal allergies     Past Surgical History:  Procedure Laterality Date  . BREAST LUMPECTOMY     right   . BREAST RECONSTRUCTION WITH PLACEMENT OF TISSUE EXPANDER AND FLEX HD (ACELLULAR HYDRATED DERMIS) Bilateral 08/13/2016   Procedure: BREAST RECONSTRUCTION WITH PLACEMENT OF TISSUE EXPANDER AND ALLODERM;  Surgeon: Irene Limbo, MD;  Location: [facility];  Service: Plastics;  Laterality: Bilateral;  . DILATION AND CURETTAGE OF UTERUS    . JOINT REPLACEMENT    . MASTECTOMY W/ SENTINEL NODE BIOPSY Bilateral 08/13/2016   Procedure: BILATERAL TOTAL MASTECTOMY WITH RIGHT AXILLARY SENTINEL LYMPH NODE BIOPSY;  Surgeon: [name], MD;  Location: [place] [facility] [place];  Service: General;  Laterality: Bilateral;  . TOTAL HIP  ARTHROPLASTY Right 01/07/2016   Procedure: TOTAL HIP ARTHROPLASTY ANTERIOR APPROACH;  Surgeon: Gaynelle Arabian, MD;  Location: [facility];  Service: Orthopedics;  Laterality: Right;    There were no vitals filed for this visit.      Subjective Assessment - 09/30/16 1036    Subjective PT states that she was sore from last treatment.  Sore enough that she had to take some mm relaxors.    Pertinent History Right breast cancer diagnosed 06/18/16; went to [facility] on 06/23/16 and next day met with Dr. Iran Planas.  Had a second scan to rule out liver issue that showed up on breast MRI.  Pt. had bilateral mastectomy 08/13/16 with immediate reconstruction/expander placement.  Just started aromosin today.  No chemo nor radiation.  One lymph node removed on right side, which was negative.  Has had three fills of expanders; goes back 10/13/16 to discuss her choice of implant, and will decide then when to have next surgery.  Diabetes and HTN, controlled with meds.  Had right hip replacement in May 2017, which is doing well, and eventually she will need the left done.   Limitations Other (comment)  reaching   Patient Stated Goals get my strength back   Currently in Pain? No/denies                         [facility] Adult PT Treatment/Exercise - 09/30/16 0001  Shoulder Exercises: Supine   Protraction Both;10 reps   External Rotation Right;10 reps  2#   Flexion Both;10 reps;Other (comment)   Flexion Limitations 1# cane   ABduction AAROM;Both;10 reps   ABduction Limitations 1# cane   Other Supine Exercises PROM all motions bilaterally   Other Supine Exercises Butterfly x 10      Shoulder Exercises: Seated   Other Seated Exercises ER stretch 15" x 3     Shoulder Exercises: Standing   Other Standing Exercises Pectorial stretch  x 3; wall stretches for flexion and abduction     Manual Therapy   Manual Therapy Joint mobilization   Manual therapy comments completed seperate from all other skilled  interventions   Joint Mobilization scapular mobilizationm, anterior and inferior jt capsule                 PT Education - 09/30/16 1108    Education provided Yes   Education Details HEP   Person(s) Educated Patient   Methods Explanation   Comprehension Verbalized understanding                [facility] Goals - 09/24/16 1225      CC Long Term Goal  #1   Title Quick DASH score will be redued to 15 or less.   Baseline 25 at eval   Time 4   Period Weeks   Status New     CC Long Term Goal  #2   Title Bilateral shoulder active flexion to 150 degrees.   Time 4   Period Weeks   Status New     CC Long Term Goal  #3   Title Bilateral shoulder active abduction 160 degrees.   Time 4   Period Weeks   Status New     CC Long Term Goal  #4   Title Bilateral shoulder active external rotation at least 80 degrees   Time 4   Period Weeks   Status New     CC Long Term Goal  #5   Title knowledgeable about lymphedema risk reduction practices   Baseline she was told today at eval that her risk is extremely small   Time 4   Period Weeks   Status New     CC Long Term Goal  #6   Title bilateral shoulder strength grossly 4/5 or better   Time 4   Period Weeks   Status New     CC Long Term Goal  #7   Title independent with HEP for shoulder ROM and strengthening   Time 4   Period Weeks   Status New     Additional Goals   Additional Goals Yes            Plan - 09/30/16 1114    Clinical Impression Statement Added jt mobs and scapular mobilization to program.  Pt has improved ROM significantly with ER being the most limited.  Pt given HEP.    Rehab Potential Excellent   Clinical Impairments Affecting Rehab Potential still having expander fills   PT Frequency 2x / week   PT Duration 4 weeks  prn   PT Treatment/Interventions ADLs/Self Care Home Management;Therapeutic activities;Therapeutic exercise;Patient/family education;Manual techniques;Passive range  of motion   PT Next Visit Plan Assess to see if pt has full ROM for flexion and abduction while sitting.     PT Home Exercise Plan breast multi-disciplinary clinic stretches right now   Consulted and Agree with Plan of Care Patient  Patient will benefit from skilled therapeutic intervention in order to improve the following deficits and impairments:  Decreased range of motion, Decreased strength, Impaired UE functional use  Visit Diagnosis: Stiffness of right shoulder, not elsewhere classified  Stiffness of left shoulder, not elsewhere classified  Muscle weakness (generalized)     Problem List Patient Active Problem List   Diagnosis Date Noted  . Genetic testing 07/09/2016  . Family history of breast cancer   . Family history of colon cancer   . Family history of stomach cancer   . Malignant neoplasm of lower-outer quadrant of right female breast ([place]) 06/21/2016  . OA (osteoarthritis) of hip 01/07/2016    Richard Ritchey,CINDY 09/30/2016, 4:28 PM  [place], [place], [address] Phone: [phone]   Fax:  [phone]  Name: TEKISHA DARCEY MRN: [id number] Date of Birth: 04-Jun-1956

## 2016-09-30 NOTE — Patient Instructions (Addendum)
ROM: Flexion - Wand (Supine)    Lie on back holding wand. Raise arms over head.  Repeat __10__ times per set. Do _1___ sets per session. Do 2____ sessions per day.  http://orth.exer.us/928   Copyright  VHI. All rights reserved.  ROM: Abduction - Wand    Holding wand with right hand palm up, push wand directly out to side, leading with other hand palm down, until stretch is felt. Hold __10__ seconds. Repeat ___10_ times per set. Do _1___ sets per session. Do __2__ sessions per day.  http://orth.exer.us/746   Copyright  VHI. All rights reserved.  ROM: External / Internal Rotation - in Abduction (Standing)    With upper arm parallel to floor and elbows bent at right angles, gently rotate arms up then down as far as possible without pain.  Do with right hand only with 1-2# weight Repeat 10____ times per set. Do __1__ sets per session. Do __2__ sessions per day.  http://orth.exer.us/912   Copyright  VHI. All rights reserved.  ROM: Posterior Capsule Stretch    Gently pull on left forward elbow with other hand until stretch is felt in shoulder. Hold __15__ seconds. Repeat __10__ times per set. Do ____ sets per session. Do __2__ sessions per day.  http://orth.exer.us/886   Copyright  VHI. All rights reserved.  ROM: External Rotation    Keeping right  forearm palm down on table, bend forward at waist until stretch is felt. Hold _30___ seconds. Repeat ___5_ times per set. Do _1___ sets per session. Do 2____ sessions per day.  http://orth.exer.us/762   Copyright  VHI. All rights reserved.

## 2016-10-04 ENCOUNTER — Ambulatory Visit (HOSPITAL_COMMUNITY): Payer: BC Managed Care – PPO | Admitting: Physical Therapy

## 2016-10-04 DIAGNOSIS — M25611 Stiffness of right shoulder, not elsewhere classified: Secondary | ICD-10-CM

## 2016-10-04 DIAGNOSIS — M25612 Stiffness of left shoulder, not elsewhere classified: Secondary | ICD-10-CM

## 2016-10-04 NOTE — Therapy (Signed)
[place], [place], [address] Phone: [phone]   Fax:  [phone]  Physical Therapy Treatment  Patient Details  Name: Carolyn Glass MRN: [id number] Date of Birth: Oct 22, 1955 Referring Provider: Dr. Irene Limbo  Encounter Date: 10/04/2016      PT End of Session - 10/04/16 1213    Visit Number 4   Number of Visits 9   Date for PT Re-Evaluation 10/25/16   Authorization - Visit Number 4   Authorization - Number of Visits 9   PT Start Time 1120   PT Stop Time 1200   PT Time Calculation (min) 40 min   Activity Tolerance Patient tolerated treatment well   Behavior During Therapy [facility] for tasks assessed/performed      Past Medical History:  Diagnosis Date  . Anxiety   . Arthritis   . Asthma    allergy and exercise induced asthma   . Depression   . Diabetes mellitus without complication ([place])   . Family history of breast cancer   . Family history of colon cancer   . Family history of stomach cancer   . GERD (gastroesophageal reflux disease)   . Hypertension   . Malignant neoplasm of lower-outer quadrant of right female breast ([place]) 06/21/2016  . Seasonal allergies     Past Surgical History:  Procedure Laterality Date  . BREAST LUMPECTOMY     right   . BREAST RECONSTRUCTION WITH PLACEMENT OF TISSUE EXPANDER AND FLEX HD (ACELLULAR HYDRATED DERMIS) Bilateral 08/13/2016   Procedure: BREAST RECONSTRUCTION WITH PLACEMENT OF TISSUE EXPANDER AND ALLODERM;  Surgeon: Irene Limbo, MD;  Location: [place];  Service: Plastics;  Laterality: Bilateral;  . DILATION AND CURETTAGE OF UTERUS    . JOINT REPLACEMENT    . MASTECTOMY W/ SENTINEL NODE BIOPSY Bilateral 08/13/2016   Procedure: BILATERAL TOTAL MASTECTOMY WITH RIGHT AXILLARY SENTINEL LYMPH NODE BIOPSY;  Surgeon: [name], MD;  Location: [place];  Service: General;  Laterality: Bilateral;  . TOTAL HIP  ARTHROPLASTY Right 01/07/2016   Procedure: TOTAL HIP ARTHROPLASTY ANTERIOR APPROACH;  Surgeon: Gaynelle Arabian, MD;  [facility];  Service: Orthopedics;  Laterality: Right;    There were no vitals filed for this visit.      Subjective Assessment - 10/04/16 1204    Subjective Pt states no pain or soreness today, however feels alot of tension and pulling in Rt chest wall with certain movements.  States she's pretty much doing everything without difficulties at this point.  Returns to MD 2/7 to discuss implant surgery.                         [place] Adult PT Treatment/Exercise - 10/04/16 0001      Shoulder Exercises: Supine   Protraction Both;10 reps   Protraction Limitations 2# weight   Flexion 15 reps;AAROM   Flexion Limitations 2# cane   ABduction AAROM;Both;15 reps   ABduction Limitations 2# cane   Other Supine Exercises PROM all motions bilaterally   Other Supine Exercises Butterfly x 10      Shoulder Exercises: Seated   Flexion Both;10 reps   Abduction Both;10 reps     Shoulder Exercises: Standing   Other Standing Exercises Pectorial stretch  x 3      Manual Therapy   Manual Therapy Myofascial release;Passive ROM   Manual therapy comments completed seperate from all other skilled interventions   Myofascial Release Right  pec and anterior shoulder fascia   Passive ROM bilateral shoulders                        [facility] Goals - 09/24/16 1225      CC Long Term Goal  #1   Title Quick DASH score will be redued to 15 or less.   Baseline 25 at eval   Time 4   Period Weeks   Status Progressing (N/T today)     CC Long Term Goal  #2   Title Bilateral shoulder active flexion to 150 degrees.   Time 4   Period Weeks   Status Achieved 160 degrees today     CC Long Term Goal  #3   Title Bilateral shoulder active abduction 160 degrees.   Time 4   Period Weeks   Status Progressing 140 degrees     CC Long Term Goal  #4   Title  Bilateral shoulder active external rotation at least 80 degrees   Time 4   Period Weeks   Status Achieved     CC Long Term Goal  #5   Title knowledgeable about lymphedema risk reduction practices   Baseline she was told today at eval that her risk is extremely small   Time 4   Period Weeks   Status Achieved     CC Long Term Goal  #6   Title bilateral shoulder strength grossly 4/5 or better   Time 4   Period Weeks   Status Progressing (N/T today)     CC Long Term Goal  #7   Title independent with HEP for shoulder ROM and strengthening   Time 4   Period Weeks   Status Achieved     Additional Goals   Additional Goals Yes            Plan - 10/04/16 1214    Clinical Impression Statement Pt with continued improvements.  Mostly having tightness in Rt chest/pec region but able to loosen these adhesions with myofascial techniques. Pt with full functional ROM in seated postiton.  In supine now with 160 degrees AROM flexion bilateraterally and 140 degrees abduction.  Pt has met most goals as set at initial evaluation.  discusesed continuation of services with patient and she could not suggest any activities still having difficulty with.  Requested she present any other areas of concentration at next appointment.     Rehab Potential Excellent   Clinical Impairments Affecting Rehab Potential still having expander fills   PT Frequency 2x / week   PT Duration 4 weeks  prn   PT Treatment/Interventions ADLs/Self Care Home Management;Therapeutic activities;Therapeutic exercise;Patient/family education;Manual techniques;Passive range of motion   PT Next Visit Plan discuss continuation of skilled therapy with patient; discuss other deficits.   PT Home Exercise Plan breast multi-disciplinary clinic stretches right now   Consulted and Agree with Plan of Care Patient      Patient will benefit from skilled therapeutic intervention in order to improve the following deficits and impairments:   Decreased range of motion, Decreased strength, Impaired UE functional use  Visit Diagnosis: Stiffness of right shoulder, not elsewhere classified  Stiffness of left shoulder, not elsewhere classified     Problem List Patient Active Problem List   Diagnosis Date Noted  . Genetic testing 07/09/2016  . Family history of breast cancer   . Family history of colon cancer   . Family history of stomach cancer   .  Malignant neoplasm of lower-outer quadrant of right female breast ([place]) 06/21/2016  . OA (osteoarthritis) of hip 01/07/2016    Teena Irani, PTA/CLT [phone]  10/04/2016, 12:17 PM  [place] [address] [place], [place], [address] Phone: [phone]   Fax:  [phone]  Name: Carolyn Glass MRN: [id number] Date of Birth: [birth year]

## 2016-10-06 ENCOUNTER — Ambulatory Visit (HOSPITAL_COMMUNITY): Payer: BC Managed Care – PPO | Admitting: Physical Therapy

## 2016-10-06 DIAGNOSIS — C50511 Malignant neoplasm of lower-outer quadrant of right female breast: Secondary | ICD-10-CM

## 2016-10-06 DIAGNOSIS — M25611 Stiffness of right shoulder, not elsewhere classified: Secondary | ICD-10-CM

## 2016-10-06 DIAGNOSIS — R293 Abnormal posture: Secondary | ICD-10-CM

## 2016-10-06 DIAGNOSIS — Z170 Estrogen receptor positive status [ER+]: Secondary | ICD-10-CM

## 2016-10-06 DIAGNOSIS — M25612 Stiffness of left shoulder, not elsewhere classified: Secondary | ICD-10-CM

## 2016-10-06 DIAGNOSIS — M6281 Muscle weakness (generalized): Secondary | ICD-10-CM

## 2016-10-06 NOTE — Therapy (Signed)
[place], [place], [address] Phone: [phone]   Fax:  [phone]  Physical Therapy Treatment  Patient Details  Name: Carolyn Glass MRN: [id number] Date of Birth: 1956/09/04 Referring Provider: Dr. Rosine Beat  Encounter Date: 10/06/2016      PT End of Session - 10/06/16 1150    Visit Number 5   Number of Visits 5   Date for PT Re-Evaluation 10/25/16   Authorization - Visit Number 5   Authorization - Number of Visits 5   PT Start Time 1115   PT Stop Time 1140   PT Time Calculation (min) 25 min   Activity Tolerance Patient tolerated treatment well   Behavior During Therapy [facility] for tasks assessed/performed      Past Medical History:  Diagnosis Date  . Anxiety   . Arthritis   . Asthma    allergy and exercise induced asthma   . Depression   . Diabetes mellitus without complication ([place])   . Family history of breast cancer   . Family history of colon cancer   . Family history of stomach cancer   . GERD (gastroesophageal reflux disease)   . Hypertension   . Malignant neoplasm of lower-outer quadrant of right female breast ([place]) 06/21/2016  . Seasonal allergies     Past Surgical History:  Procedure Laterality Date  . BREAST LUMPECTOMY     right   . BREAST RECONSTRUCTION WITH PLACEMENT OF TISSUE EXPANDER AND FLEX HD (ACELLULAR HYDRATED DERMIS) Bilateral 08/13/2016   Procedure: BREAST RECONSTRUCTION WITH PLACEMENT OF TISSUE EXPANDER AND ALLODERM;  Surgeon: Irene Limbo, MD;  Location: [facility];  Service: Plastics;  Laterality: Bilateral;  . DILATION AND CURETTAGE OF UTERUS    . JOINT REPLACEMENT    . MASTECTOMY W/ SENTINEL NODE BIOPSY Bilateral 08/13/2016   Procedure: BILATERAL TOTAL MASTECTOMY WITH RIGHT AXILLARY SENTINEL LYMPH NODE BIOPSY;  Surgeon: [name], MD;  Location: [facility];  Service: General;  Laterality: Bilateral;  . TOTAL HIP ARTHROPLASTY  Right 01/07/2016   Procedure: TOTAL HIP ARTHROPLASTY ANTERIOR APPROACH;  Surgeon: Gaynelle Arabian, MD;  Location: [facility];  Service: Orthopedics;  Laterality: Right;    There were no vitals filed for this visit.      Subjective Assessment - 10/06/16 1123    Subjective Pt states that she only feels discomfort at end range.  She is I with her exercises but she can tell that she does not have the same mobility in her right shoulder as she does in her left at this time.    Pertinent History Right breast cancer diagnosed 06/18/16; went to [facility] on 06/23/16 and next day met with Dr. Iran Planas.  Had a second scan to rule out liver issue that showed up on breast MRI.  Pt. had bilateral mastectomy 08/13/16 with immediate reconstruction/expander placement.  Just started aromosin today.  No chemo nor radiation.  One lymph node removed on right side, which was negative.  Has had three fills of expanders; goes back 10/13/16 to discuss her choice of implant, and will decide then when to have next surgery.  Diabetes and HTN, controlled with meds.  Had right hip replacement in May 2017, which is doing well, and eventually she will need the left done.   Currently in Pain? No/denies            [facility] PT Assessment - 10/06/16 0001      Assessment   Medical Diagnosis right  breast cancer, s/p bilateral mastectomies with immediate expander placement   Referring Provider Dr. Rosine Beat   Onset Date/Surgical Date 08/13/16   Hand Dominance Right     Precautions   Precautions Other (comment)   Precaution Comments cancer precautions     Restrictions   Weight Bearing Restrictions No     Balance Screen   Has the patient fallen in the past 6 months No   Has the patient had a decrease in activity level because of a fear of falling?  No   Is the patient reluctant to leave their home because of a fear of falling?  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 Iran Planas recently, but is doing well   Quick DASH  9  was 25 goal was to be at 15      ROM / Strength   AROM / PROM / Strength Strength     AROM   Right Shoulder Flexion 165 Degrees  was129   Right Shoulder ABduction 160 Degrees  was 104   Right Shoulder Internal Rotation --  WFL, supine   Right Shoulder External Rotation 80 Degrees  was 50   Left Shoulder Flexion 170 Degrees  was170   Left Shoulder ABduction 170 Degrees  was 102   Left Shoulder Internal Rotation --  WFL, supine   Left Shoulder External Rotation 85 Degrees  was 45     PROM   Right Shoulder Flexion --   Left Shoulder Flexion --     Strength   Overall Strength Comments B shoulder wnl for all motions               Quick Dash - 10/06/16 0001    Open a tight or new jar Moderate difficulty  This has always been like this.   Do heavy household chores (wash walls, wash floors) No difficulty   Carry a shopping bag or briefcase No difficulty   Wash your back No difficulty   Use a knife to cut food No difficulty   Recreational activities in which you take some force or impact through your arm, shoulder, or hand (golf, hammering, tennis) No difficulty   During the past week, to what extent has your arm, shoulder or hand problem interfered with  your normal social activities with family, friends, neighbors, or groups? Not at all   During the past week, to what extent has your arm, shoulder or hand problem limited your work or other regular daily activities Slightly   Arm, shoulder, or hand pain. None   Tingling (pins and needles) in your arm, shoulder, or hand Mild   Difficulty Sleeping No difficulty   DASH Score 9.09 %  [facility] PT Treatment/Exercise - 10/06/16 0001      Shoulder Exercises: Supine   Other Supine Exercises Butterfly x 10      Shoulder Exercises: Standing   Flexion Both;5 reps  followed by 5 self PROM using Lt hand at end range.    ABduction Both;5 reps                PT Education - 10/06/16 1149    Education provided Yes   Education Details self ROM for increased shoulder flexion, The importance to continuing to exercise.    Person(s) Educated Patient   Methods Explanation;Demonstration   Comprehension Verbalized understanding;Returned demonstration                Long Term Clinic Goals - 09/24/16 1225      CC Long Term Goal  #1   Title Quick DASH score will be redued to 15 or less.   Baseline 25 at eval now 9   Time 4   Period Weeks   Status Met      CC Long Term Goal  #2   Title Bilateral shoulder active flexion to 150 degrees.   Time 4   Period Weeks   Status Met     CC Long Term Goal  #3   Title Bilateral shoulder active abduction 160 degrees.   Time 4   Period Weeks   Status Met     CC Long Term Goal  #4   Title Bilateral shoulder active external rotation at least 80 degrees   Time 4   Period Weeks   Status Met     CC Long Term Goal  #5   Title knowledgeable about lymphedema risk reduction practices   Baseline she was told today at eval that her risk is extremely small   Time 4   Period Weeks   Status met     CC Long Term Goal  #6   Title bilateral shoulder strength grossly 4/5 or better   Time 4   Period Weeks   Status New      CC Long Term Goal  #7   Title independent with HEP for shoulder ROM and strengthening   Time 4   Period Weeks   Status New     Additional Goals   Additional Goals Yes            Plan - 10/06/16 1151    Clinical Impression Statement Pt is I in exercises.  Her ROM is functional although mild restriction at end range.  Pt strength is wfl with her quick dash decreasing from 25 to 9.  Therapist and patient agreed that pt is ready for discharge with the understanding that the patient should push her end Range of motion in her right shoulder until she does not feel any tightness when performing full ROM exercises.    Rehab Potential Excellent   Clinical Impairments Affecting Rehab Potential still having expander fills   PT Frequency 2x / week   PT Duration 4 weeks  prn   PT Treatment/Interventions ADLs/Self Care Home Management;Therapeutic activities;Therapeutic exercise;Patient/family education;Manual techniques;Passive range of motion   PT Next Visit Plan Discharge pt from skilled therapy to [place] breast multi-disciplinary clinic stretches right now   Consulted and Agree with Plan of Care Patient      Patient will benefit from skilled therapeutic intervention in order to improve the following deficits and impairments:  Decreased range of motion, Decreased  strength, Impaired UE functional use  Visit Diagnosis: Stiffness of right shoulder, not elsewhere classified  Stiffness of left shoulder, not elsewhere classified  Muscle weakness (generalized)  Abnormal posture  Carcinoma of lower-outer quadrant of right breast in female, estrogen receptor positive ([place])     Problem List Patient Active Problem List   Diagnosis Date Noted  . Genetic testing 07/09/2016  . Family history of breast cancer   . Family history of colon cancer   . Family history of stomach cancer   . Malignant neoplasm of lower-outer quadrant of right female breast ([place]) 06/21/2016  .  OA (osteoarthritis) of hip 01/07/2016  Rayetta Humphrey, PT CLT [phone] 10/06/2016, 11:55 AM  [place], [place], [address] Phone: [phone]   Fax:  [phone]  Name: JERRICA THORMAN MRN: [id number] Date of Birth: 09-Feb-1956   PHYSICAL THERAPY DISCHARGE SUMMARY  Visits from Start of Care: 5  Current functional level related to goals / functional outcomes: I with all aspects   Remaining deficits: none   Education / Equipment: As above  Plan: Patient agrees to discharge.  Patient goals were met. Patient is being discharged due to meeting the stated rehab goals.  ?????        Rayetta Humphrey, [place] CLT [phone]

## 2016-10-11 ENCOUNTER — Ambulatory Visit (HOSPITAL_COMMUNITY): Payer: BC Managed Care – PPO | Admitting: Physical Therapy

## 2016-10-18 ENCOUNTER — Encounter (HOSPITAL_COMMUNITY): Payer: Self-pay

## 2016-10-20 ENCOUNTER — Ambulatory Visit (INDEPENDENT_AMBULATORY_CARE_PROVIDER_SITE_OTHER): Payer: BC Managed Care – PPO | Admitting: Physician Assistant

## 2016-10-20 ENCOUNTER — Encounter: Payer: Self-pay | Admitting: Physician Assistant

## 2016-10-20 VITALS — BP 138/90 | HR 88 | Temp 98.4°F | Resp 16 | Wt 186.6 lb

## 2016-10-20 DIAGNOSIS — R509 Fever, unspecified: Secondary | ICD-10-CM

## 2016-10-20 DIAGNOSIS — K219 Gastro-esophageal reflux disease without esophagitis: Secondary | ICD-10-CM

## 2016-10-20 DIAGNOSIS — R635 Abnormal weight gain: Secondary | ICD-10-CM | POA: Diagnosis not present

## 2016-10-20 LAB — CBC WITH DIFFERENTIAL/PLATELET
Basophils Absolute: 0 cells/uL (ref 0–200)
Basophils Relative: 0 %
EOS PCT: 4 %
Eosinophils Absolute: 212 cells/uL (ref 15–500)
HCT: 40.6 % (ref 35.0–45.0)
Hemoglobin: 13.2 g/dL (ref 12.0–15.0)
Lymphocytes Relative: 27 %
Lymphs Abs: 1431 cells/uL (ref 850–3900)
MCH: 30.5 pg (ref 27.0–33.0)
MCHC: 32.5 g/dL (ref 32.0–36.0)
MCV: 93.8 fL (ref 80.0–100.0)
MONOS PCT: 8 %
MPV: 10.4 fL (ref 7.5–12.5)
Monocytes Absolute: 424 cells/uL (ref 200–950)
NEUTROS ABS: 3233 {cells}/uL (ref 1500–7800)
NEUTROS PCT: 61 %
PLATELETS: 312 10*3/uL (ref 140–400)
RBC: 4.33 MIL/uL (ref 3.80–5.10)
RDW: 14.3 % (ref 11.0–15.0)
WBC: 5.3 10*3/uL (ref 3.8–10.8)

## 2016-10-20 LAB — URINALYSIS, ROUTINE W REFLEX MICROSCOPIC
BILIRUBIN URINE: NEGATIVE
Glucose, UA: NEGATIVE
KETONES UR: NEGATIVE
Leukocytes, UA: NEGATIVE
Nitrite: NEGATIVE
PROTEIN: NEGATIVE
Specific Gravity, Urine: >1.03 (ref 1.001–1.035)
pH: 5 (ref 5.0–8.0)

## 2016-10-20 LAB — URINALYSIS, MICROSCOPIC ONLY
Casts: NONE SEEN [LPF]
Crystals: NONE SEEN [HPF]
YEAST: NONE SEEN [HPF]

## 2016-10-20 MED ORDER — OMEPRAZOLE 20 MG PO CPDR: 20.0000 mg | DELAYED_RELEASE_CAPSULE | Freq: Every day | ORAL | 3 refills | Status: DC

## 2016-10-20 NOTE — Progress Notes (Signed)
Patient ID: Carolyn Glass MRN: [id number], DOB: 1955/12/25, 61 y.o. Date of Encounter: 10/20/2016, 10:52 AM    Chief Complaint:  Chief Complaint  Patient presents with  . Fever     HPI: 61 y.o. year old female has been diagnosed with breast cancer and recently underwent bilateral mastectomy. Says that she is now being evaluated/plan for breast reconstruction surgery. On 10/13/16 she had an appointment with the plastic surgeon. Says when she was there for that visit they noted that her "temperature was 99 point something".  She states that on Sunday 10/17/16 she "was just feeling a little bad" "so she checked her temperature and got 99 point something".  Says that she has checked her temperature at other times and has been getting normal readings-- less than 98.7. Says that she is just waiting for the phone call for them to schedule the surgery and that she doesn't want to end up with some type of infection delaying the surgery.  She reports that she has had no nasal congestion, no mucus from the nose. No cough. No sore throat. Had no nausea, no vomiting,no diarrhea, no abdominal pain. Had no dysuria, no urinary urgency, no urinary frequency.  She also pulls out a prescription bottle and says that she has been on those hormones for one month and has gained 10 pounds in one month. Is asking what she can do about her weight. Says that it is very frustrating. Says that prior to that she had to have some surgeries and therefore exercise was prohibited for a while and now this is happening so she is having further weight gain.  Also shows me a bottle of omeprazole that she has been buying over-the-counter. Says that she has been having to take this because of all of the anti-inflammatories that she has been on for her osteoarthritis. Says that she thinks would be cheaper to get this as a prescription and is wondering if I can send prescription for omeprazole.  No other concerns today.     Home  Meds:   Outpatient Medications Prior to Visit  Medication Sig Dispense Refill  . Acetaminophen (TYLENOL ARTHRITIS PAIN PO) Take by mouth 2 (two) times daily.    Marland Kitchen albuterol (PROVENTIL HFA;VENTOLIN HFA) 108 (90 Base) MCG/ACT inhaler Inhale 2 puffs into the lungs every 6 (six) hours as needed for wheezing or shortness of breath.    . benazepril (LOTENSIN) 20 MG tablet TAKE (1) TABLET BY MOUTH TWICE DAILY. 60 tablet 11  . escitalopram (LEXAPRO) 20 MG tablet Take 1 tablet (20 mg total) by mouth daily. 30 tablet 11  . exemestane (AROMASIN) 25 MG tablet Take 1 tablet (25 mg total) by mouth daily after breakfast. 30 tablet 2  . fexofenadine (ALLEGRA) 180 MG tablet Take 180 mg by mouth daily.    . metFORMIN (GLUCOPHAGE) 500 MG tablet Take 1 tablet (500 mg total) by mouth 2 (two) times daily with a meal. 60 tablet 5  . omeprazole (PRILOSEC) 40 MG capsule Take 40 mg by mouth daily.    . cyclobenzaprine (FLEXERIL) 10 MG tablet Take 1 tablet (10 mg total) by mouth 3 (three) times daily as needed for muscle spasms. (Patient not taking: Reported on 10/20/2016) 30 tablet 0  . oxyCODONE (ROXICODONE) 5 MG immediate release tablet Take 1-2 tablets (5-10 mg total) by mouth every 4 (four) hours as needed for severe pain. (Patient not taking: Reported on 09/10/2016) 40 tablet 0   No facility-administered medications prior to visit.  Allergies:  Allergies  Allergen Reactions  . Azithromycin Itching and Rash  . Biaxin [Clarithromycin] Rash  . Codeine Rash    Thinks a codeine cough syrup caused rash.  . Macrolides And Ketolides Rash      Review of Systems: See HPI for pertinent ROS. All other ROS negative.    Physical Exam: Blood pressure 138/90, pulse 88, temperature 98.4 F (36.9 C), temperature source Oral, resp. rate 16, weight 186 lb 9.6 oz (84.6 kg), SpO2 98 %., Body mass index is 33.05 kg/m. General:  WF. Appears in no acute distress. HEENT: Normocephalic, atraumatic, eyes without discharge,  sclera non-icteric, nares are without discharge. Bilateral auditory canals clear, TM's are without perforation, pearly grey and translucent with reflective cone of light bilaterally. Oral cavity moist, posterior pharynx without exudate, erythema, peritonsillar abscess.  Neck: Supple. No thyromegaly. No lymphadenopathy. Lungs: Clear bilaterally to auscultation without wheezes, rales, or rhonchi. Breathing is unlabored. Heart: Regular rhythm. No murmurs, rubs, or gallops. Abdomen: Soft, non-tender, non-distended with normoactive bowel sounds. No hepatomegaly. No rebound/guarding. No obvious abdominal masses. Msk:  Strength and tone normal for age. Extremities/Skin: Warm and dry.  Neuro: Alert and oriented X 3. Moves all extremities spontaneously. Gait is normal. CNII-XII grossly in tact. Psych:  Responds to questions appropriately with a normal affect.     ASSESSMENT AND PLAN:  61 y.o. year old female with  1. Fever, unspecified fever cause Will check UA to R/O UTI, check CBC - CBC with Differential/Platelet - Urinalysis, Routine w reflex microscopic  2. Weight gain Will check TSH to see if thyroid contributing to weight gain. Explained to her that this is normal she will have to manage her weight gain with diet and exercise. - TSH  3. Gastroesophageal reflux disease, esophagitis presence not specified - omeprazole (PRILOSEC) 20 MG capsule; Take 1 capsule (20 mg total) by mouth daily.  Dispense: 30 capsule; Refill: 3   Signed, [address] [place], [place], [facility] 10/20/2016 10:52 AM

## 2016-10-21 LAB — TSH: TSH: 1.88 m[IU]/L

## 2016-10-25 NOTE — H&P (Signed)
Subjective:     Patient ID: Carolyn Glass is a 61 y.o. female.  HPI  2.5 months post op. Plan second stage breast reconstruction with implants.   Presented following normal screening MMG 02/2016 and then screening MRI 06/2016 due to family history. MRI showed right breast 1.4 x 1.6 x 1.1 cm irregular mass. Anterior and lateral to this mass was an additional 1.4 cm irregular mass, total span 2.4 cm. In that lateral right breast 8 mm enhancing mass compatible with previously biopsied benign right fibroadenoma.  Additionaly in the lateral right breast middle depth there is an indeterminate 7 mm irregular enhancing mass.Within the anterior lateral right breast there was an indeterminate 7 mm irregular enhancing mass. In the LEFT breast subareolar  9 x 8 x 6 mm mass noted. Subsuquent [place] was normal on LEFT. On right three biopsies labeled 9, 6:30 and 6 oclock  all demonstrated ILC/LCIS, ER/PR +, Her2-. Axilla was negative. Final pathology with left breast LCIS, ADH. Right breast with multifocal ILC spanning 1.5 cm, 1.5 cm, 1.0 cm with LCIS margins clear. 0/1 SLN. Oncotype 11. Plan adjuvant aromasin for 10 years.  Genetics negative. Prior 38 C, happy with this. Right mastectomy 974 g  Left 956 g  PMH significant for DM, last HbA1c 6.7 9/17 Notes she went on metformin prior to THA, was diet controlled throughout life and has gone off Metformin .  Review of Systems     Objective:   Physical Exam  Cardiovascular: Normal rate, regular rhythm and normal heart sounds.   Pulmonary/Chest: Effort normal and breath sounds normal.  BW R 13.5 L 13.5 cm  Chest: soft scars maturing bilateral animation depression right axilla bilateral superior pole, depression medial inner expander  TXH:FSFS no hernias no striae  Assessment:     Right ILC LOQ S/p bilateral SSM, TE/ADM (Alloderm) reconstruction    Plan:     Plan implant exchange, possible lipofilling. Plan implant exchange and  possiblelipofilling. Discsused saline vs silicone, round vs shaped, textured vs smooth. Reviewed incidence of ALCL with textured implants, difference between companies, treatment for this. Reviewed benefits of texturing including possible less mobility or displacement implants. Reviewed MRI surveillance for silicone for rupture. Reviewed shared risks rippling, rupture, contracture, infection requiring removal.At this time plan smooth round saline.  Reviewed purpose fat grafting to aid with contour, thicken flaps. Discussed donor site incisions, pain, bruising, compression x 6 weeks, risk donor site deformities, variable take graft, fat necrosis that presents as lumps, andneed to repeat procedure. Asked her to purchase compression garment for use.  Plan OP surgery. Additional risks including but not limited to bleeding, hematoma, seroma, asymmetry, implant displacement DVT/PE, cardiopulmonary complications, unacceptable cosmetic appearance, damage to deeper structures, need for additional surgery reviewed.   Natrelle [id number] 500 ml tissue expanders placed bilateral,  RIGHT  525 ml total fill volume LEFT 525 ml total fill volume     Irene Limbo, MD [facility] [phone], pin [phone]

## 2016-11-03 ENCOUNTER — Encounter (HOSPITAL_BASED_OUTPATIENT_CLINIC_OR_DEPARTMENT_OTHER): Payer: Self-pay | Admitting: *Deleted

## 2016-11-03 ENCOUNTER — Encounter (HOSPITAL_BASED_OUTPATIENT_CLINIC_OR_DEPARTMENT_OTHER)
Admission: RE | Admit: 2016-11-03 | Discharge: 2016-11-03 | Disposition: A | Discharge status: Home / Self Care | Payer: BC Managed Care – PPO | Source: Ambulatory Visit | Attending: Plastic Surgery | Admitting: Plastic Surgery

## 2016-11-03 DIAGNOSIS — I10 Essential (primary) hypertension: Secondary | ICD-10-CM | POA: Diagnosis not present

## 2016-11-03 DIAGNOSIS — K219 Gastro-esophageal reflux disease without esophagitis: Secondary | ICD-10-CM | POA: Diagnosis not present

## 2016-11-03 DIAGNOSIS — Z9013 Acquired absence of bilateral breasts and nipples: Secondary | ICD-10-CM | POA: Diagnosis not present

## 2016-11-03 DIAGNOSIS — Z421 Encounter for breast reconstruction following mastectomy: Secondary | ICD-10-CM | POA: Diagnosis not present

## 2016-11-03 DIAGNOSIS — E119 Type 2 diabetes mellitus without complications: Secondary | ICD-10-CM | POA: Diagnosis not present

## 2016-11-03 DIAGNOSIS — Z79811 Long term (current) use of aromatase inhibitors: Secondary | ICD-10-CM | POA: Diagnosis not present

## 2016-11-03 DIAGNOSIS — Z79899 Other long term (current) drug therapy: Secondary | ICD-10-CM | POA: Diagnosis not present

## 2016-11-03 DIAGNOSIS — Z7984 Long term (current) use of oral hypoglycemic drugs: Secondary | ICD-10-CM | POA: Diagnosis not present

## 2016-11-03 DIAGNOSIS — Z853 Personal history of malignant neoplasm of breast: Secondary | ICD-10-CM | POA: Diagnosis not present

## 2016-11-03 LAB — BASIC METABOLIC PANEL
Anion gap: 7 (ref 5–15)
BUN: 8 mg/dL (ref 6–20)
CALCIUM: 9.2 mg/dL (ref 8.9–10.3)
CO2: 29 mmol/L (ref 22–32)
CREATININE: 0.61 mg/dL (ref 0.44–1.00)
Chloride: 102 mmol/L (ref 101–111)
GFR calc non Af Amer: >60 mL/min (ref >60)
Glucose, Bld: 130 mg/dL — ABNORMAL HIGH (ref 65–99)
Potassium: 3.7 mmol/L (ref 3.5–5.1)
SODIUM: 138 mmol/L (ref 135–145)

## 2016-11-03 NOTE — Progress Notes (Signed)
Bottled water given with instructions to complete by 0430 dos, pt verbalized understanding. 

## 2016-11-04 MED ORDER — PROPOFOL 10 MG/ML IV BOLUS
INTRAVENOUS | Status: AC
  Filled 2016-11-04: qty 20

## 2016-11-04 MED ORDER — FENTANYL CITRATE (PF) 250 MCG/5ML IJ SOLN
INTRAMUSCULAR | Status: AC
  Filled 2016-11-04: qty 5

## 2016-11-04 MED ORDER — MIDAZOLAM HCL 2 MG/2ML IJ SOLN
INTRAMUSCULAR | Status: AC
  Filled 2016-11-04: qty 2

## 2016-11-04 NOTE — Anesthesia Preprocedure Evaluation (Signed)
Anesthesia Evaluation  Patient identified by MRN, date of birth, ID band Patient awake    Reviewed: Allergy & Precautions, NPO status , Patient's Chart, lab work & pertinent test results  Airway Mallampati: II  TM Distance: >3 FB Neck ROM: Full    Dental  (+) Teeth Intact, Dental Advisory Given   Pulmonary    breath sounds clear to auscultation       Cardiovascular hypertension,  Rhythm:Regular Rate:Normal     Neuro/Psych    GI/Hepatic GERD  ,  Endo/Other  diabetes, Type 2  Renal/GU      Musculoskeletal  (+) Arthritis ,   Abdominal   Peds  Hematology   Anesthesia Other Findings   Reproductive/Obstetrics                             Anesthesia Physical  Anesthesia Plan  ASA: III  Anesthesia Plan: General   Post-op Pain Management:    Induction: Intravenous  Airway Management Planned: Oral ETT  Additional Equipment:   Intra-op Plan:   Post-operative Plan: Extubation in OR  Informed Consent: I have reviewed the patients History and Physical, chart, labs and discussed the procedure including the risks, benefits and alternatives for the proposed anesthesia with the patient or authorized representative who has indicated his/her understanding and acceptance.   Dental advisory given  Plan Discussed with: CRNA  Anesthesia Plan Comments:        Anesthesia Quick Evaluation

## 2016-11-05 ENCOUNTER — Encounter (HOSPITAL_BASED_OUTPATIENT_CLINIC_OR_DEPARTMENT_OTHER): Payer: Self-pay

## 2016-11-05 ENCOUNTER — Ambulatory Visit (HOSPITAL_BASED_OUTPATIENT_CLINIC_OR_DEPARTMENT_OTHER): Payer: BC Managed Care – PPO | Admitting: Anesthesiology

## 2016-11-05 ENCOUNTER — Ambulatory Visit (HOSPITAL_BASED_OUTPATIENT_CLINIC_OR_DEPARTMENT_OTHER)
Admission: RE | Admit: 2016-11-05 | Discharge: 2016-11-05 | Disposition: A | Discharge status: Home / Self Care | Payer: BC Managed Care – PPO | Source: Ambulatory Visit | Attending: Plastic Surgery | Admitting: Plastic Surgery

## 2016-11-05 ENCOUNTER — Other Ambulatory Visit: Payer: BC Managed Care – PPO

## 2016-11-05 ENCOUNTER — Encounter (HOSPITAL_BASED_OUTPATIENT_CLINIC_OR_DEPARTMENT_OTHER)
Admission: RE | Disposition: A | Discharge status: Home / Self Care | Payer: Self-pay | Source: Ambulatory Visit | Attending: Plastic Surgery

## 2016-11-05 ENCOUNTER — Ambulatory Visit: Payer: BC Managed Care – PPO | Admitting: Hematology

## 2016-11-05 DIAGNOSIS — Z79811 Long term (current) use of aromatase inhibitors: Secondary | ICD-10-CM | POA: Insufficient documentation

## 2016-11-05 DIAGNOSIS — Z9013 Acquired absence of bilateral breasts and nipples: Secondary | ICD-10-CM | POA: Insufficient documentation

## 2016-11-05 DIAGNOSIS — K219 Gastro-esophageal reflux disease without esophagitis: Secondary | ICD-10-CM | POA: Insufficient documentation

## 2016-11-05 DIAGNOSIS — Z421 Encounter for breast reconstruction following mastectomy: Secondary | ICD-10-CM | POA: Insufficient documentation

## 2016-11-05 DIAGNOSIS — I10 Essential (primary) hypertension: Secondary | ICD-10-CM | POA: Insufficient documentation

## 2016-11-05 DIAGNOSIS — E119 Type 2 diabetes mellitus without complications: Secondary | ICD-10-CM | POA: Insufficient documentation

## 2016-11-05 DIAGNOSIS — Z853 Personal history of malignant neoplasm of breast: Secondary | ICD-10-CM | POA: Insufficient documentation

## 2016-11-05 DIAGNOSIS — Z7984 Long term (current) use of oral hypoglycemic drugs: Secondary | ICD-10-CM | POA: Insufficient documentation

## 2016-11-05 DIAGNOSIS — Z79899 Other long term (current) drug therapy: Secondary | ICD-10-CM | POA: Insufficient documentation

## 2016-11-05 HISTORY — PX: REMOVAL OF BILATERAL TISSUE EXPANDERS WITH PLACEMENT OF BILATERAL BREAST IMPLANTS: SHX6431

## 2016-11-05 HISTORY — PX: LIPOSUCTION WITH LIPOFILLING: SHX6436

## 2016-11-05 LAB — GLUCOSE, CAPILLARY: GLUCOSE-CAPILLARY: 205 mg/dL — AB (ref 65–99)

## 2016-11-05 SURGERY — REMOVAL, TISSUE EXPANDER, BREAST, BILATERAL, WITH BILATERAL IMPLANT IMPLANT INSERTION
Anesthesia: General | Laterality: Bilateral

## 2016-11-05 MED ORDER — SODIUM BICARBONATE 4 % IV SOLN
INTRAVENOUS | Status: DC | PRN
  Administered 2016-11-05: 500 mL via INTRAMUSCULAR

## 2016-11-05 MED ORDER — CELECOXIB 400 MG PO CAPS
400.0000 mg | ORAL_CAPSULE | ORAL | Status: AC
  Administered 2016-11-05: 400 mg via ORAL

## 2016-11-05 MED ORDER — PROPOFOL 500 MG/50ML IV EMUL
INTRAVENOUS | Status: AC
  Filled 2016-11-05: qty 50

## 2016-11-05 MED ORDER — MIDAZOLAM HCL 2 MG/2ML IJ SOLN
1.0000 mg | INTRAMUSCULAR | Status: DC | PRN
  Administered 2016-11-05: 2 mg via INTRAVENOUS

## 2016-11-05 MED ORDER — MIDAZOLAM HCL 2 MG/2ML IJ SOLN
INTRAMUSCULAR | Status: AC
  Filled 2016-11-05: qty 2

## 2016-11-05 MED ORDER — OXYCODONE HCL 5 MG PO TABS: 5.0000 mg | ORAL_TABLET | Freq: Once | ORAL | Status: DC | PRN

## 2016-11-05 MED ORDER — HYDROMORPHONE HCL 1 MG/ML IJ SOLN
INTRAMUSCULAR | Status: AC
  Filled 2016-11-05: qty 1

## 2016-11-05 MED ORDER — SUFENTANIL CITRATE 50 MCG/ML IV SOLN
INTRAVENOUS | Status: AC
  Filled 2016-11-05: qty 1

## 2016-11-05 MED ORDER — SODIUM CHLORIDE 0.9 % IV SOLN
INTRAVENOUS | Status: DC | PRN
  Administered 2016-11-05: 1000 mL

## 2016-11-05 MED ORDER — SUFENTANIL CITRATE 50 MCG/ML IV SOLN
INTRAVENOUS | Status: DC | PRN
  Administered 2016-11-05 (×2): 5 ug via INTRAVENOUS
  Administered 2016-11-05: 20 ug via INTRAVENOUS
  Administered 2016-11-05: 5 ug via INTRAVENOUS

## 2016-11-05 MED ORDER — CEFAZOLIN SODIUM-DEXTROSE 2-4 GM/100ML-% IV SOLN
INTRAVENOUS | Status: AC
  Filled 2016-11-05: qty 100

## 2016-11-05 MED ORDER — GABAPENTIN 300 MG PO CAPS
ORAL_CAPSULE | ORAL | Status: AC
  Filled 2016-11-05: qty 1

## 2016-11-05 MED ORDER — EPINEPHRINE 30 MG/30ML IJ SOLN
INTRAMUSCULAR | Status: AC
  Filled 2016-11-05: qty 1

## 2016-11-05 MED ORDER — ACETAMINOPHEN 500 MG PO TABS
1000.0000 mg | ORAL_TABLET | ORAL | Status: AC
  Administered 2016-11-05: 1000 mg via ORAL

## 2016-11-05 MED ORDER — NEOSTIGMINE METHYLSULFATE 10 MG/10ML IV SOLN
INTRAVENOUS | Status: DC | PRN
  Administered 2016-11-05: 3 mg via INTRAVENOUS

## 2016-11-05 MED ORDER — LIDOCAINE HCL (PF) 1 % IJ SOLN
INTRAMUSCULAR | Status: AC
  Filled 2016-11-05: qty 60

## 2016-11-05 MED ORDER — PHENYLEPHRINE HCL 10 MG/ML IJ SOLN
INTRAMUSCULAR | Status: DC | PRN
  Administered 2016-11-05: 80 ug via INTRAVENOUS

## 2016-11-05 MED ORDER — HYDROMORPHONE HCL 1 MG/ML IJ SOLN
0.2500 mg | INTRAMUSCULAR | Status: DC | PRN
  Administered 2016-11-05 (×2): 0.5 mg via INTRAVENOUS
  Administered 2016-11-05: 0.25 mg via INTRAVENOUS

## 2016-11-05 MED ORDER — LACTATED RINGERS IV SOLN
INTRAVENOUS | Status: DC
  Administered 2016-11-05 (×3): via INTRAVENOUS

## 2016-11-05 MED ORDER — SODIUM BICARBONATE 4 % IV SOLN
INTRAVENOUS | Status: AC
  Filled 2016-11-05: qty 10

## 2016-11-05 MED ORDER — GABAPENTIN 300 MG PO CAPS
300.0000 mg | ORAL_CAPSULE | ORAL | Status: AC
  Administered 2016-11-05: 300 mg via ORAL

## 2016-11-05 MED ORDER — CEFAZOLIN SODIUM-DEXTROSE 2-4 GM/100ML-% IV SOLN
2.0000 g | INTRAVENOUS | Status: AC
  Administered 2016-11-05: 2 g via INTRAVENOUS

## 2016-11-05 MED ORDER — LIDOCAINE HCL (CARDIAC) 20 MG/ML IV SOLN
INTRAVENOUS | Status: DC | PRN
  Administered 2016-11-05: 100 mg via INTRAVENOUS

## 2016-11-05 MED ORDER — ACETAMINOPHEN 500 MG PO TABS
ORAL_TABLET | ORAL | Status: AC
  Filled 2016-11-05: qty 2

## 2016-11-05 MED ORDER — SULFAMETHOXAZOLE-TRIMETHOPRIM 800-160 MG PO TABS: 1.0000 | ORAL_TABLET | Freq: Two times a day (BID) | ORAL | 0 refills | Status: DC

## 2016-11-05 MED ORDER — GLYCOPYRROLATE 0.2 MG/ML IJ SOLN
INTRAMUSCULAR | Status: DC | PRN
  Administered 2016-11-05: .4 mg via INTRAVENOUS

## 2016-11-05 MED ORDER — CHLORHEXIDINE GLUCONATE CLOTH 2 % EX PADS: 6.0000 | MEDICATED_PAD | Freq: Once | CUTANEOUS | Status: DC

## 2016-11-05 MED ORDER — SCOPOLAMINE 1 MG/3DAYS TD PT72: 1.0000 | MEDICATED_PATCH | Freq: Once | TRANSDERMAL | Status: DC | PRN

## 2016-11-05 MED ORDER — CELECOXIB 200 MG PO CAPS
ORAL_CAPSULE | ORAL | Status: AC
  Filled 2016-11-05: qty 2

## 2016-11-05 MED ORDER — EPHEDRINE 5 MG/ML INJ
INTRAVENOUS | Status: AC
Start: 2016-11-05 — End: 2016-11-05
  Filled 2016-11-05: qty 10

## 2016-11-05 MED ORDER — OXYCODONE HCL 5 MG PO TABS
5.0000 mg | ORAL_TABLET | ORAL | 0 refills | Status: DC | PRN
Start: 2016-11-05 — End: 2017-02-16

## 2016-11-05 MED ORDER — ROCURONIUM BROMIDE 100 MG/10ML IV SOLN
INTRAVENOUS | Status: DC | PRN
  Administered 2016-11-05: 50 mg via INTRAVENOUS
  Administered 2016-11-05: 10 mg via INTRAVENOUS

## 2016-11-05 MED ORDER — OXYCODONE HCL 5 MG/5ML PO SOLN: 5.0000 mg | Freq: Once | ORAL | Status: DC | PRN

## 2016-11-05 MED ORDER — MEPERIDINE HCL 25 MG/ML IJ SOLN: 6.2500 mg | INTRAMUSCULAR | Status: DC | PRN

## 2016-11-05 MED ORDER — DEXAMETHASONE SODIUM PHOSPHATE 4 MG/ML IJ SOLN
INTRAMUSCULAR | Status: DC | PRN
  Administered 2016-11-05: 10 mg via INTRAVENOUS

## 2016-11-05 MED ORDER — FENTANYL CITRATE (PF) 100 MCG/2ML IJ SOLN: 50.0000 ug | INTRAMUSCULAR | Status: DC | PRN

## 2016-11-05 MED ORDER — ROCURONIUM BROMIDE 50 MG/5ML IV SOSY
PREFILLED_SYRINGE | INTRAVENOUS | Status: AC
  Filled 2016-11-05: qty 10

## 2016-11-05 MED ORDER — ONDANSETRON HCL 4 MG/2ML IJ SOLN
INTRAMUSCULAR | Status: DC | PRN
  Administered 2016-11-05: 4 mg via INTRAVENOUS

## 2016-11-05 MED ORDER — PROMETHAZINE HCL 25 MG/ML IJ SOLN: 6.2500 mg | INTRAMUSCULAR | Status: DC | PRN

## 2016-11-05 SURGICAL SUPPLY — 78 items
BAG DECANTER FOR FLEXI CONT (MISCELLANEOUS) ×2 IMPLANT
BINDER ABDOMINAL 10 UNV 27-48 (MISCELLANEOUS) IMPLANT
BINDER ABDOMINAL 12 SM 30-45 (SOFTGOODS) ×1 IMPLANT
BINDER BREAST 3XL (BIND) IMPLANT
BINDER BREAST LRG (GAUZE/BANDAGES/DRESSINGS) IMPLANT
BINDER BREAST MEDIUM (GAUZE/BANDAGES/DRESSINGS) IMPLANT
BINDER BREAST XLRG (GAUZE/BANDAGES/DRESSINGS) IMPLANT
BINDER BREAST XXLRG (GAUZE/BANDAGES/DRESSINGS) IMPLANT
BLADE SURG 10 STRL SS (BLADE) ×4 IMPLANT
BLADE SURG 11 STRL SS (BLADE) ×2 IMPLANT
BNDG GAUZE ELAST 4 BULKY (GAUZE/BANDAGES/DRESSINGS) ×4 IMPLANT
CANISTER LIPO FAT HARVEST (MISCELLANEOUS) ×2 IMPLANT
CANISTER SUCT 1200ML W/VALVE (MISCELLANEOUS) ×4 IMPLANT
CHLORAPREP W/TINT 26ML (MISCELLANEOUS) ×3 IMPLANT
COVER BACK TABLE 60X90IN (DRAPES) ×2 IMPLANT
COVER MAYO STAND STRL (DRAPES) ×3 IMPLANT
DECANTER SPIKE VIAL GLASS SM (MISCELLANEOUS) IMPLANT
DRAIN CHANNEL 15F RND FF W/TCR (WOUND CARE) IMPLANT
DRAPE TOP ARMCOVERS (MISCELLANEOUS) ×2 IMPLANT
DRAPE U-SHAPE 76X120 STRL (DRAPES) ×2 IMPLANT
DRSG PAD ABDOMINAL 8X10 ST (GAUZE/BANDAGES/DRESSINGS) ×6 IMPLANT
ELECT BLADE 4.0 EZ CLEAN MEGAD (MISCELLANEOUS) ×2
ELECT COATED BLADE 2.86 ST (ELECTRODE) ×2 IMPLANT
ELECT REM PT RETURN 9FT ADLT (ELECTROSURGICAL) ×2
ELECTRODE BLDE 4.0 EZ CLN MEGD (MISCELLANEOUS) ×1 IMPLANT
ELECTRODE REM PT RTRN 9FT ADLT (ELECTROSURGICAL) ×1 IMPLANT
EVACUATOR SILICONE 100CC (DRAIN) IMPLANT
FILTER LIPOSUCTION (MISCELLANEOUS) ×2 IMPLANT
GLOVE BIO SURGEON STRL SZ 6 (GLOVE) ×7 IMPLANT
GLOVE BIO SURGEON STRL SZ7 (GLOVE) ×2 IMPLANT
GLOVE BIOGEL PI IND STRL 7.0 (GLOVE) IMPLANT
GLOVE BIOGEL PI INDICATOR 7.0 (GLOVE) ×2
GLOVE ECLIPSE 6.5 STRL STRAW (GLOVE) ×2 IMPLANT
GOWN STRL REUS W/ TWL LRG LVL3 (GOWN DISPOSABLE) ×2 IMPLANT
GOWN STRL REUS W/ TWL XL LVL3 (GOWN DISPOSABLE) IMPLANT
GOWN STRL REUS W/TWL LRG LVL3 (GOWN DISPOSABLE) ×6
GOWN STRL REUS W/TWL XL LVL3 (GOWN DISPOSABLE) ×4
IMPL BREAST SALINE 700-750C (Breast) IMPLANT
IMPLANT BREAST SALINE 700-750C (Breast) ×4 IMPLANT
IV NS 500ML (IV SOLUTION)
IV NS 500ML BAXH (IV SOLUTION) IMPLANT
KIT FILL SYSTEM UNIVERSAL (SET/KITS/TRAYS/PACK) IMPLANT
LINER CANISTER 1000CC FLEX (MISCELLANEOUS) ×2 IMPLANT
LIQUID BAND (GAUZE/BANDAGES/DRESSINGS) ×5 IMPLANT
NDL HYPO 25X1 1.5 SAFETY (NEEDLE) IMPLANT
NDL SAFETY ECLIPSE 18X1.5 (NEEDLE) ×1 IMPLANT
NEEDLE HYPO 18GX1.5 SHARP (NEEDLE) ×2
NEEDLE HYPO 25X1 1.5 SAFETY (NEEDLE) IMPLANT
NS IRRIG 1000ML POUR BTL (IV SOLUTION) ×2 IMPLANT
PACK BASIN DAY SURGERY FS (CUSTOM PROCEDURE TRAY) ×2 IMPLANT
PAD ALCOHOL SWAB (MISCELLANEOUS) ×2 IMPLANT
PENCIL BUTTON HOLSTER BLD 10FT (ELECTRODE) ×2 IMPLANT
PIN SAFETY STERILE (MISCELLANEOUS) ×2 IMPLANT
SHEET MEDIUM DRAPE 40X70 STRL (DRAPES) ×3 IMPLANT
SIZER BREAST SGL USE HP 650CC (SIZER) ×2
SIZER BRST SGL USE HP 650CC (SIZER) IMPLANT
SLEEVE SCD COMPRESS KNEE MED (MISCELLANEOUS) ×2 IMPLANT
SPONGE LAP 18X18 X RAY DECT (DISPOSABLE) ×5 IMPLANT
STAPLER VISISTAT 35W (STAPLE) ×2 IMPLANT
SUT ETHILON 2 0 FS 18 (SUTURE) IMPLANT
SUT MNCRL AB 4-0 PS2 18 (SUTURE) ×5 IMPLANT
SUT PDS AB 2-0 CT2 27 (SUTURE) IMPLANT
SUT VIC AB 3-0 PS1 18 (SUTURE)
SUT VIC AB 3-0 PS1 18XBRD (SUTURE) IMPLANT
SUT VIC AB 3-0 SH 27 (SUTURE) ×4
SUT VIC AB 3-0 SH 27X BRD (SUTURE) ×2 IMPLANT
SUT VICRYL 4-0 PS2 18IN ABS (SUTURE) ×4 IMPLANT
SYR 10ML LL (SYRINGE) ×8 IMPLANT
SYR 50ML LL SCALE MARK (SYRINGE) ×10 IMPLANT
SYR BULB IRRIGATION 50ML (SYRINGE) ×4 IMPLANT
SYR CONTROL 10ML LL (SYRINGE) IMPLANT
SYR TB 1ML LL NO SAFETY (SYRINGE) ×2 IMPLANT
TOWEL OR 17X24 6PK STRL BLUE (TOWEL DISPOSABLE) ×4 IMPLANT
TUBE CONNECTING 20X1/4 (TUBING) ×3 IMPLANT
TUBING INFILTRATION IT-10001 (TUBING) ×2 IMPLANT
TUBING SET GRADUATE ASPIR 12FT (MISCELLANEOUS) ×2 IMPLANT
UNDERPAD 30X30 (UNDERPADS AND DIAPERS) ×4 IMPLANT
YANKAUER SUCT BULB TIP NO VENT (SUCTIONS) ×2 IMPLANT

## 2016-11-05 NOTE — Interval H&P Note (Signed)
History and Physical Interval Note:  11/05/2016 6:58 AM  Carolyn Glass  has presented today for surgery, with the diagnosis of HISTORY OF BREAST CANCER  The various methods of treatment have been discussed with the patient and family. After consideration of risks, benefits and other options for treatment, the patient has consented to  Procedure(s): REMOVAL OF BILATERAL TISSUE EXPANDERS WITH PLACEMENT OF BILATERAL BREAST SALINE IMPLANTS (Bilateral) LIPOFILLING FROM ABDOMIN TO BILATERAL CHEST (Bilateral) as a surgical intervention .  The patient's history has been reviewed, patient examined, no change in status, stable for surgery.  I have reviewed the patient's chart and labs.  Questions were answered to the patient's satisfaction.     Carolyn Glass

## 2016-11-05 NOTE — Op Note (Addendum)
Operative Note   DATE OF OPERATION: 3.2.18  LOCATION: [facility]-outpatient  SURGICAL DIVISION: Plastic Surgery  PREOPERATIVE DIAGNOSES:  1. History breast cancer 2. Acquired absence breasts   POSTOPERATIVE DIAGNOSES:  same  PROCEDURE:  1. Removal bilateral tissue expanders and placement saline implants 2. Lipofilling to bilateral chest  SURGEON: Irene Limbo MD MBA  ASSISTANT: none  ANESTHESIA:  General.   EBL: 55 ml  COMPLICATIONS: None immediate.   INDICATIONS FOR PROCEDURE:  The patient, Carolyn Glass, is a 61 y.o. female born on 13-Jan-1956, is here for second stage reconstruction following bilateral skin sparing mastectomies and immediate expander, acellular dermis reconstruction.   FINDINGS: Full incorporation acellular dermis bilateral. 70 ml fat infiltrated over each hemi chest. Natrelle Smooth Round Saline High Profile 700 ml implants placed bilateral, fill volume 750 ml bilateral. REF [id number] RIGHT SN [id number] LEFT SN [id number]  DESCRIPTION OF PROCEDURE:  The patient's operative site was marked with the patient in the preoperative area to mark sternal notch, chest midline, anterior axillary lines, and area of left lateral chest wall and inferolateral mastectomy flap marked for liposuction contouring. Area for fat harvest from supra and infra umbilical abdomen marked. The patient was taken to the operating room. SCDs were placed and IV antibiotics were given. A time out was performed and all information was confirmed to be correct. The patient's chest and abdomen were then prepped and draped in a sterile fashion.   Incision made in right chest mastectomy scar. Incision carried through to pectoralis muscle and capsule. Muscle divided in direction of fibers. Expander removed. Capsulotomies performed superiorly and medially with extensive radial scoring medially and inferiorly. Sizer was placed, and I then directed attention to left breast. The left chest  demonstrated more redundancy of mastectomy flaps and thicker flaps. In order to address this approximately 1 cm of flap deepithelialized surrounding scar. Pectoralis muscle divided in direction of fibers and expander removed. Superior capsulotomies performed.Sizer placed.  Natrelle Smooth Round High Profile 700 ml  implants selected.  Stab incision made over bilateral lateral abdomen and tumescent fluid infiltrated over supra and infra umbilical abdomen, total 400 ml tumescent infiltrated. Power assisted liposuction performed to endpoint symmetric contour and soft tissue thickness, total lipoaspirate 450 ml. The fat was then washed and prepared by gravity for infiltration. Harvested fat was then infiltrated in subcutaneous and intramuscular planes throughout right mastectomy flap including axilla. Over left chest, fat infiltration limited to superior medial pole in both subcutaneous and intramuscular planes. Additional 100 ml tumescent infiltrated over left lower pole mastectomy flap and lateral chest and liposuction performed to smooth contour.  At this time bilateral cavities irrigated with solution containing Ancef, gentamicin, andbacitracin. Pockest inspected for hemostasis. Cavities then irrigated with Betadine. Implant placed in each cavity and orientation implant confirmed.Closure was completed with running 3-0 vicryl for approximation of pectoralis muscle. 4-0 vicryl was placed in dermis and running 4-0 monocryl was used to close skin. Tissue adhesive was applied to breast incisions. The abdominal incisions were approximated with 4-0 monocryl. Dry dressing and abdominal and breast binder applied  The patient was allowed to wake from anesthesia, extubated and taken to the recovery room in satisfactory condition.   SPECIMENS: none  DRAINS: none  Irene Limbo, MD [facility] [phone], pin [phone]

## 2016-11-05 NOTE — Anesthesia Postprocedure Evaluation (Signed)
Anesthesia Post Note  Patient: Carolyn Glass  Procedure(s) Performed: Procedure(s) (LRB): REMOVAL OF BILATERAL TISSUE EXPANDERS WITH PLACEMENT OF BILATERAL BREAST SALINE IMPLANTS (Bilateral) LIPOFILLING FROM ABDOMEN TO BILATERAL CHEST (Bilateral)  Patient location during evaluation: PACU Anesthesia Type: General Level of consciousness: sedated and patient cooperative Pain management: pain level controlled Vital Signs Assessment: post-procedure vital signs reviewed and stable Respiratory status: spontaneous breathing Cardiovascular status: stable Anesthetic complications: no       Last Vitals:  Vitals:   11/05/16 1215 11/05/16 1312  BP: (!) 138/97 129/89  Pulse: 85 99  Resp: 13 18  Temp:  36.9 C    Last Pain:  Vitals:   11/05/16 1312  TempSrc: Oral  PainSc: 0-No pain                 Nolon Nations

## 2016-11-05 NOTE — Transfer of Care (Signed)
Immediate Anesthesia Transfer of Care Note  Patient: Carolyn Glass  Procedure(s) Performed: Procedure(s): REMOVAL OF BILATERAL TISSUE EXPANDERS WITH PLACEMENT OF BILATERAL BREAST SALINE IMPLANTS (Bilateral) LIPOFILLING FROM ABDOMIN TO BILATERAL CHEST (Bilateral)  Patient Location: PACU  Anesthesia Type:General  Level of Consciousness: awake, alert  and oriented  Airway & Oxygen Therapy: Patient Spontanous Breathing and Patient connected to face mask oxygen  Post-op Assessment: Report given to RN and Post -op Vital signs reviewed and stable  Post vital signs: Reviewed and stable  Last Vitals:  Vitals:   11/05/16 0651  BP: (!) 159/78  Pulse: 88  Resp: 20  Temp: 37.3 C    Last Pain:  Vitals:   11/05/16 0652  TempSrc:   PainSc: 3       Patients Stated Pain Goal: 1 ([id number])  Complications: No apparent anesthesia complications

## 2016-11-05 NOTE — Anesthesia Procedure Notes (Signed)
Procedure Name: Intubation Date/Time: 11/05/2016 7:38 AM Performed by: Melynda Ripple D Pre-anesthesia Checklist: Patient identified, Emergency Drugs available, Suction available and Patient being monitored Patient Re-evaluated:Patient Re-evaluated prior to inductionOxygen Delivery Method: Circle system utilized Preoxygenation: Pre-oxygenation with 100% oxygen Intubation Type: IV induction Ventilation: Mask ventilation without difficulty Laryngoscope Size: Mac and 3 Grade View: Grade II Tube type: Oral Number of attempts: 1 Airway Equipment and Method: Stylet and Oral airway Placement Confirmation: ETT inserted through vocal cords under direct vision,  positive ETCO2 and breath sounds checked- equal and bilateral Secured at: 23 cm Tube secured with: Tape Dental Injury: Teeth and Oropharynx as per pre-operative assessment

## 2016-11-05 NOTE — Discharge Instructions (Signed)

## 2016-11-08 ENCOUNTER — Encounter (HOSPITAL_BASED_OUTPATIENT_CLINIC_OR_DEPARTMENT_OTHER): Payer: Self-pay | Admitting: Plastic Surgery

## 2016-11-09 NOTE — Progress Notes (Signed)
[place]  [phone] [phone]  Clinic Follow Up Note   Patient Care Team: [name], MD as PCP - General (Family Medicine) [name], MD as Consulting Physician (General Surgery) [name], MD as Consulting Physician (Hematology) [name], MD as Attending Physician (Radiation Oncology) 11/16/2016  CHIEF COMPLAINTS:  Follow up for right multifocal breast cancer  Oncology History   Malignant neoplasm of lower-outer quadrant of right female breast [facility])   Staging form: Breast, AJCC 7th Edition   - Clinical stage from 06/23/2016: Stage IA (T1c, N0, M0) - Unsigned      Malignant neoplasm of lower-outer quadrant of right female breast ([place])   04/28/2015 Imaging    Bone Density Report 04/28/2015 AP Spine (L1-L4): T-Score = -1.7. Osteopenia Femoral Neck (Left) : T-Score = -1.0 Normal Femoral Neck (Right): T-Score = 0.4 Normal      03/01/2016 Mammogram    Bilateral screening mammogram was negative      06/08/2016 Imaging    Bilateral breast MRI with and without contrast showed 4 lesions in the right breast, 2 adjacent 1.4 cm mass in the inferior aspect of the right breast, and in the right lateral aspect of the breast, there are 8 mm and a 7 mm irregular enhancing mass. Multiple small enhancing lesions within the liver, indeterminate. No axillary adenopathy.      06/16/2016 Initial Diagnosis    Malignant neoplasm of lower-outer quadrant of right female breast ([place])      06/16/2016 Initial Biopsy    Right breast needle core biopsy at 9 clock, 6:30 clock, 6 clock showed invasive bladder carcinoma, and LCIS      06/16/2016 Receptors her2    two right breast biopsy showed ER strongly positive, PR strongly positive, HER-2 negative, Ki-67 5-10%       06/16/2016 Imaging    Right breast and axilla ultrasound showed a 1.4 cm mass at the 6:30 o'clock, 1.0 cm mass at the 6:00, and 52m mass at 9:00 position. Axilla was  negative.      06/28/2016 Imaging    MRI ABDOMEN W W/O CONTRAST 06/28/2016 IMPRESSION: 1. Multiple hepatic hemangiomas.  No suspicious liver lesion. 2. Hepatic steatosis. 3. No evidence of abdominal metastasis.      06/28/2016 Genetic Testing    Patient has genetic testing done for breast cancer. Results revealed patient has the following variant called, MSH2 c.2650A>T.      08/13/2016 Surgery    Bilateral simple mastectomy and right SLN biopsy        08/13/2016 Pathology Results    Right mastectomy showed invasive ductal carcinoma, G2, multifocal, 1.5cm, 1.5cm, and 1.0cm. (+) LCIS, margins are negative, 1 sentinel lymph node was negative. Left simple mastectomy showed LCIS, ADH, no malignancy.       08/13/2016 Oncotype testing    Oncotype recurrence score 11, which predicts 10 year distant recurrence risk of 7% with tamoxifen alone, low risk category. No adjuvant chemo recommended based on this.       09/25/2016 -  Anti-estrogen oral therapy    Adjuvant exemestane 276mdaily        HISTORY OF PRESENTING ILLNESS: (06/23/2016) PaForbes Cellar89.o. female is here because of her newly diagnosed right breast cancer. She is accompanied by her husband to our multidisciplinary clinic today.  She [facility] which was negative for malignancy. She also had right breast lumpectomy in 2009 for benign lesion. Her last screening mammogram was  negative in June 2017. Due to her family history of breast cancer, her primary care physician felt she is at high risk for breast cancer, and referred her for breast MRI on 06/08/2016, which showed 4 small lesions in the right breast. She underwent ultrasound of the right breast and axilla, which showed 2 adjacent breast mass at 6:00 position, and a smaller mass at 9 clock position, axillary was negative. All of the 3 right breast lesions were biopsied under ultrasound guidance, which all showed invasive lobular carcinoma and  LCIS. He appears dry positive, HER-2 negative.  She feels well, denies any significant pain, except arthritis pain in the left hip, manageable. She has good appetite and energy level, works full-time at [employer]. She is married, lives with her husband. Her paternal grandmother and paternal cousin had breast cancer in their 72s.  GYN HISTORY  Menarchal: 11  LMP: 61 yo  Contraceptive: no  HRT: no  G5P1: one daughter 15 yo   CURRENT THERAPY: Exemestane (Aromasin) 25 mg daily starting September 25, 2016  INTERVAL HISTORY: Carolyn Glass returns for follow up. The patient reports difficulty falling asleep. She has hot flashes in which she has a fan running at night. When she does go to sleep, she has difficulty sleeping at night. Her left hip hurts and she states she needs it to be replaced. The left hip pain is the same now with exemestane as before starting it. The pain affects her getting up and down from chairs. Her walking is somewhat affects with some days worse than others. She reports some mood changes in which she would "snap" at her husband at times. The patient underwent reconstruction with saline implants on 11/05/16.  MEDICAL HISTORY:  Past Medical History:  Diagnosis Date  . Anxiety   . Arthritis    OA hips  . Asthma    allergy and exercise induced asthma   . Depression   . Diabetes mellitus without complication ([place])   . Family history of breast cancer   . Family history of colon cancer   . Family history of stomach cancer   . GERD (gastroesophageal reflux disease)   . Hypertension   . Malignant neoplasm of lower-outer quadrant of right female breast ([place]) 06/21/2016  . Seasonal allergies     SURGICAL HISTORY: Past Surgical History:  Procedure Laterality Date  . BREAST LUMPECTOMY     right   . BREAST RECONSTRUCTION WITH PLACEMENT OF TISSUE EXPANDER AND FLEX HD (ACELLULAR HYDRATED DERMIS) Bilateral 08/13/2016   Procedure: BREAST RECONSTRUCTION WITH PLACEMENT OF  TISSUE EXPANDER AND ALLODERM;  Surgeon: Irene Limbo, MD;  Location: [place];  Service: Plastics;  Laterality: Bilateral;  . DILATION AND CURETTAGE OF UTERUS    . JOINT REPLACEMENT    . LIPOSUCTION WITH LIPOFILLING Bilateral 11/05/2016   Procedure: LIPOFILLING FROM ABDOMEN TO BILATERAL CHEST;  Surgeon: Irene Limbo, MD;  Location: [place];  Service: Plastics;  Laterality: Bilateral;  . MASTECTOMY W/ SENTINEL NODE BIOPSY Bilateral 08/13/2016   Procedure: BILATERAL TOTAL MASTECTOMY WITH RIGHT AXILLARY SENTINEL LYMPH NODE BIOPSY;  Surgeon: [name], MD;  Location: [place];  Service: General;  Laterality: Bilateral;  . REMOVAL OF BILATERAL TISSUE EXPANDERS WITH PLACEMENT OF BILATERAL BREAST IMPLANTS Bilateral 11/05/2016   Procedure: REMOVAL OF BILATERAL TISSUE EXPANDERS WITH PLACEMENT OF BILATERAL BREAST SALINE IMPLANTS;  Surgeon: Irene Limbo, MD;  Location: [place];  Service: Plastics;  Laterality: Bilateral;  . TOTAL HIP ARTHROPLASTY  Right 01/07/2016   Procedure: TOTAL HIP ARTHROPLASTY ANTERIOR APPROACH;  Surgeon: Gaynelle Arabian, MD;  Location: [facility];  Service: Orthopedics;  Laterality: Right;    SOCIAL HISTORY: Social History   Social History  . Marital status: Married    Spouse name: N/A  . Number of children: 1  . Years of education: N/A   Occupational History  . Not on file.   Social History Main Topics  . Smoking status: Never Smoker  . Smokeless tobacco: Never Used  . Alcohol use No  . Drug use: No  . Sexual activity: Yes   Other Topics Concern  . Not on file   Social History Narrative  . No narrative on file    FAMILY HISTORY: Family History  Problem Relation Age of Onset  . Arthritis Mother   . Asthma Mother   . Diabetes Mother   . Hyperlipidemia Mother   . Hypertension Mother   . Arthritis Father   . Asthma Father   . Stroke Father   . Hyperlipidemia Father   . Early death  Brother 8    Muscular Dystrophy  . Birth defects Brother   . Cancer Maternal Uncle     possibly stomach  . Mental retardation Maternal Uncle   . Cancer Paternal Grandmother 37    breast cancer   . Cancer Cousin 30    paternal cousin's daughter with breast cancer  . Colon cancer Maternal Aunt     dx in her 47s  . Leukemia Paternal Uncle   . Lymphoma Paternal Uncle   . Down syndrome Paternal Uncle     ALLERGIES:  is allergic to azithromycin; biaxin [clarithromycin]; and codeine.  MEDICATIONS:  Current Outpatient Prescriptions  Medication Sig Dispense Refill  . Acetaminophen (TYLENOL ARTHRITIS PAIN PO) Take 2 tablets by mouth as needed.     Marland Kitchen albuterol (PROVENTIL HFA;VENTOLIN HFA) 108 (90 Base) MCG/ACT inhaler Inhale 2 puffs into the lungs every 6 (six) hours as needed for wheezing or shortness of breath.    . benazepril (LOTENSIN) 20 MG tablet TAKE (1) TABLET BY MOUTH TWICE DAILY. 60 tablet 11  . escitalopram (LEXAPRO) 20 MG tablet Take 1 tablet (20 mg total) by mouth daily. 30 tablet 11  . exemestane (AROMASIN) 25 MG tablet Take 1 tablet (25 mg total) by mouth daily after breakfast. 30 tablet 2  . fexofenadine (ALLEGRA) 180 MG tablet Take 180 mg by mouth daily.    . metFORMIN (GLUCOPHAGE) 500 MG tablet Take 1 tablet (500 mg total) by mouth 2 (two) times daily with a meal. 60 tablet 5  . omeprazole (PRILOSEC) 20 MG capsule Take 1 capsule (20 mg total) by mouth daily. 30 capsule 3  . oxyCODONE (ROXICODONE) 5 MG immediate release tablet Take 1-2 tablets (5-10 mg total) by mouth every 4 (four) hours as needed for severe pain. 30 tablet 0   No current facility-administered medications for this visit.     REVIEW OF SYSTEMS:   Constitutional: Denies fevers, chills or abnormal night sweats Eyes: Denies blurriness of vision, double vision or watery eyes Ears, nose, mouth, throat, and face: Denies mucositis or sore throat Respiratory: Denies cough, dyspnea or wheezes Cardiovascular:  Denies palpitation, chest discomfort or lower extremity swelling Gastrointestinal:  Denies nausea, heartburn or change in bowel habits Skin: Denies abnormal skin rashes Lymphatics: Denies new lymphadenopathy or easy bruising Neurological:Denies numbness, tingling or new weaknesses Behavioral/Psych: Mood is stable, no new changes  All other systems were reviewed with the patient and  are negative.  PHYSICAL EXAMINATION: ECOG PERFORMANCE STATUS: 0 - Asymptomatic  Vitals:   11/16/16 1330  BP: 136/63  Pulse: 96  Resp: 18  Temp: 98.4 F (36.9 C)   Filed Weights   11/16/16 1330  Weight: 189 lb 4.8 oz (85.9 kg)    GENERAL:alert, no distress and comfortable SKIN: skin color, texture, turgor are normal, no rashes or significant lesions EYES: normal, conjunctiva are pink and non-injected, sclera clear OROPHARYNX:no exudate, no erythema and lips, buccal mucosa, and tongue normal. NECK: supple, thyroid normal size, non-tender, without nodularity LYMPH:  no palpable lymphadenopathy in the cervical, axillary or inguinal LUNGS: clear to auscultation and percussion with normal breathing effort HEART: regular rate & rhythm and no murmurs and no lower extremity edema ABDOMEN:abdomen soft, non-tender and normal bowel sounds Musculoskeletal:no cyanosis of digits and no clubbing  PSYCH: alert & oriented x 3 with fluent speech NEURO: no focal motor/sensory deficits  Breasts: (+) Bilateral mastectomy with tissue expander. Right side covered in gauze. Left incision well healed.  LABORATORY DATA:  I have reviewed the data as listed CBC Latest Ref Rng & Units 11/16/2016 10/20/2016 06/23/2016  WBC 3.9 - 10.3 10e3/uL 7.8 5.3 4.9  Hemoglobin 11.6 - 15.9 g/dL 12.4 13.2 12.8  Hematocrit 34.8 - 46.6 % 37.6 40.6 39.2  Platelets 145 - 400 10e3/uL 316 312 250   CMP Latest Ref Rng & Units 11/16/2016 11/03/2016 08/12/2016  Glucose 70 - 140 mg/dl 282(H) 130(H) 96  BUN 7.0 - 26.0 mg/dL 12._0 Creatinine 0.6  - 1.1 mg/dL 0.8 0.61 0.70  Sodium 136 - 145 mEq/L 137 138 136  Potassium 3.5 - 5.1 mEq/L 4.0 3.7 3.9  Chloride 101 - 111 mmol/L - 102 103  CO2 22 - 29 mEq/L _1 Calcium 8.4 - 10.4 mg/dL 9.6 9.2 9.3  Total Protein 6.4 - 8.3 g/dL 7.1 - -  Total Bilirubin 0.20 - 1.20 mg/dL 0.30 - -  Alkaline Phos 40 - 150 U/L 122 - -  AST 5 - 34 U/L 20 - -  ALT 0 - 55 U/L 29 - -   PATHOLOGY REPORT  ONCOTYPE DX 08/13/2016   Diagnosis 08/13/2016 1. Breast, simple mastectomy, Left - LOBULAR NEOPLASIA (LOBULAR CARCINOMA IN SITU). - ATYPICAL DUCTAL HYPERPLASIA. - FLAT EPITHELIAL ATYPIA. - FIBROCYSTIC CHANGES WITH ADENOSIS AND CALCIFICATIONS. 2. Breast, simple mastectomy, Right - INVASIVE LOBULAR CARCINOMA, GRADE II/III, MULTIPLE FOCI, SPANNING 1.5 CM, 1.5 CM AND 1.0 CM. - LOBULAR CARCINOMA IN SITU. - THE SURGICAL RESECTION MARGINS ARE NEGATIVE FOR INVASIVE CARCINOMA. - SEE ONCOLOGY TABLE BELOW. 3. Lymph node, sentinel, biopsy, Right - THERE IS NO EVIDENCE OF CARCINOMA IN 1 OF 1 LYMPH NODE (0/1). - SEE COMMENT. Microscopic Comment 2. BREAST, INVASIVE TUMOR, WITH LYMPH NODES PRESENT Specimen, including laterality and lymph node sampling (sentinel, non-sentinel): Right breast and right axillary lymph node. Procedure: Simple mastectomy and axillary lymph node resection x1. Histologic type: Lobular Grade: II Tubule formation: 3 Nuclear pleomorphism: 2 Mitotic:2 Tumor size (gross measurements): 1.5 cm, 1.5 cm, and 1.0 cm Margins: Greater than 0.2 cm to all margins. Lymphovascular invasion: Not identified. Ductal carcinoma in situ: Not identified. Tumor focality: At least three foci. Extent of tumor: Confined to breast parenchyma. Lymph nodes: 1 of 3 FINAL for EDIN, SKARDA [phone]) Microscopic Comment(continued) Examined: 1 Sentinel 0 Non-sentinel 1 Total Lymph nodes with metastasis: 0 Breast prognostic profile: Estrogen receptor: Positive. Progesterone receptor: Positive. Her 2  neu: Negative. Ki-67: 5% - 10% Non-neoplastic  breast: Fibrocystic changes with adenosis and calcifications. TNM: mpT1c, pN0 Comments: There are three nodules of similar appearing invasive lobular carcinoma with associated lobular carcinoma in situ. The carcinoma cells are negative for E-cadherin, supporting a lobular phenotype. 3. Immunohistochemical stains performed on two blocks of part 3 fail to highlight the presence of cytokeratin positive tumor cells. (JBK:ecj 08/17/2016) Enid Cutter MD Pathologist, Electronic Signature (Case signed 08/17/2016)  Genetic Testing 06/28/2016 Genetic testing did identify a variant called, MSH2 c.2650A>T.  Diagnosis 06/16/2016 1. Breast, right, needle core biopsy, 9 o'clock - INVASIVE LOBULAR CARCINOMA. - LOBULAR CARCINOMA IN SITU. - SEE COMMENT. 2. Breast, right, needle core biopsy, 6:30 o'clock - INVASIVE LOBULAR CARCINOMA. - LOBULAR CARCINOMA IN SITU. - SEE COMMENT. 3. Breast, right, needle core biopsy, 6 o'clock - INVASIVE LOBULAR CARCINOMA. - LOBULAR CARCINOMA IN SITU. - SEE COMMENT. Microscopic Comment 1. Smooth muscle myosin, p63 and calponin stains are performed on the first specimen (right 9 o'clock needle core biopsy). The stains confirm the presence of both invasive and in situ carcinoma. An E-cadherin immunohistochemical stain is also performed. This additional stain is negative in both the invasive and in situ components confirming a lobular phenotype. Although definitive grading of breast carcinoma is best done on excision, the features of the invasive tumor from the right 9 o'clock breast biopsy are compatible with a grade 2 breast carcinoma. Breast prognostic markers will be performed and reported in an addendum. Findings are called to the [facility] on 06/18/16. Dr. Vicente Males has seen this specimen in consultation with agreement. 2. Smooth muscle myosin, p63 and calponin stains are performed on the second  specimen (right 6:30 o'clock needle core biopsy). The stains confirm the presence of both invasive and in situ carcinoma. An E-cadherin immunohistochemical stain is also performed. This additional stain is negative in both the invasive and in situ components confirming a lobular phenotype. Although definitive grading of breast carcinoma is best done on excision, the features of the invasive tumor from the right 6:30 o'clock biopsy are compatible with a grade 2 breast carcinoma. Breast prognostic markers will be performed and reported in an addendum. Findings are called to the [facility] on 06/18/16. Dr. Vicente Males has seen this specimen in consultation with agreement. 3. An E-cadherin immunohistochemical stain is performed on the third specimen (right 6 o'clock breast biopsy). The stain fails to demonstrate positivity in the invasive or in situ component confirming the lobular nature of both. Although definitive grading of breast carcinoma is best done on excision, the features of the invasive tumor from the right 6 o'clock breast biopsy are compatible with a grade 2 breast carcinoma. Breast prognostic markers will be performed and reported in an addendum. Findings are called to the [facility] on 06/18/16. Dr. Vicente Males has seen this specimen in consultation with agreement. (RAH:gt, 06/18/16) Willeen Niece MD  1. Results: IMMUNOHISTOCHEMICAL AND MORPHOMETRIC ANALYSIS PERFORMED MANUALLY Estrogen Receptor: 95%, POSITIVE, STRONG STAINING INTENSITY Progesterone Receptor: 95%, POSITIVE, STRONG STAINING INTENSITY Proliferation Marker Ki67: 10%  Results: HER2 - NEGATIVE RATIO OF HER2/CEP17 SIGNALS 1.16 AVERAGE HER2 COPY NUMBER PER CELL 2.20  2. PROGNOSTIC INDICATORS Insufficient tissue   3. PROGNOSTIC INDICATORS Results: IMMUNOHISTOCHEMICAL AND MORPHOMETRIC ANALYSIS PERFORMED MANUALLY Estrogen Receptor: 100%, POSITIVE, STRONG STAINING  INTENSITY Progesterone Receptor: 100%, POSITIVE, STRONG STAINING INTENSITY Proliferation Marker Ki67: 5% Results: HER2 - NEGATIVE RATIO OF HER2/CEP17 SIGNALS 1.19 AVERAGE HER2 COPY NUMBER PER CELL 3.15   RADIOGRAPHIC STUDIES: I have personally reviewed the  radiological images as listed and agreed with the findings in the report. No results found.  ASSESSMENT & PLAN: 61 y.o. Caucasian postmenopausal woman, presented image discovered multifocal right breast masses.  1. Malignant neoplasm of lower-outer quadrant of right female breast, invasive lobular carcinoma and LCIS, mpT1cN0M0, stage IA, G2, ER+/PR+/HER2- -I reviewed her surgical pathology findings with patient in details. Her surgical margins was negative, 1 sentinel lymph nodes was negative. She had stage I disease. -Oncotype DX score was 11 with the 10 year risk of distant recurrence with tamoxifen alone is 7%. This is at low risk. Therefore I do not recommend chemotherapy.  -she is on adjuvant exemestane, has mild insomnia, otherwise tolerating well. Plan for total 5 years. -She had a bilateral mastectomy, no routine mammogram needed -The patient underwent reconstruction with saline implants on 11/05/16 by Dr. Iran Planas. -I encouraged her to have healthy diet and exercise regularly. -We'll continue breast cancer surveillance with routine lab, exam and follow-up   2. Genetics -She has 2 family members who had breast cancer at a young age, she will qualify for genetic testing, we'll refer her. -Blood sample sent on 06/28/16. -Genetic testing did identify a VUS in MSH2, c.2650A>T.  3. DM, type 2 -She is on metformin now, her energy sugar was 282 today, she does not control her diet. -I advised the patient to check her glucose at home. -Advised low carb diet and exercise. -She'll follow-up with her primary care physician.  4. Arthritis -She had a total right hip replacement, has mild arthralgia in the left hip, manageable. -We  discussed that adjuvant hormonal therapy could make this worse.  5. Osteopenia -Bone scan 04/28/15 revealed AP Spine (L1-L4) T-Score = -1.7. -I advised the patient to take Calcium and vitamin D. Will check her Vit D level yearly  -Advised the patient to exercise to strengthen her bones.  6. Insomnia  -I encouraged her to try melatonin or Benadryl for her insomnia   PLAN -Continue Exemestane. -Continue follow up with plastic surgery. -Lab and f/u in 3 months.  No orders of the defined types were placed in this encounter.   All questions were answered. The patient knows to call the clinic with any problems, questions or concerns.  I spent 25 minutes counseling the patient face to face. The total time spent in the appointment was 30 minutes and more than 50% was on counseling.     [name], MD 11/16/2016 2:19 PM  This document serves as a record of services personally performed by [name], MD. It was created on her behalf by Darcus Austin, a trained medical scribe. The creation of this record is based on the scribe's personal observations and the provider's statements to them. This document has been checked and approved by the attending provider.

## 2016-11-16 ENCOUNTER — Encounter: Payer: Self-pay | Admitting: Hematology

## 2016-11-16 ENCOUNTER — Other Ambulatory Visit (HOSPITAL_BASED_OUTPATIENT_CLINIC_OR_DEPARTMENT_OTHER): Payer: BC Managed Care – PPO

## 2016-11-16 ENCOUNTER — Ambulatory Visit (HOSPITAL_BASED_OUTPATIENT_CLINIC_OR_DEPARTMENT_OTHER): Payer: BC Managed Care – PPO | Admitting: Hematology

## 2016-11-16 ENCOUNTER — Telehealth: Payer: Self-pay | Admitting: Hematology

## 2016-11-16 VITALS — BP 136/63 | HR 96 | Temp 98.4°F | Resp 18 | Ht 65.0 in | Wt 189.3 lb

## 2016-11-16 DIAGNOSIS — M8580 Other specified disorders of bone density and structure, unspecified site: Secondary | ICD-10-CM | POA: Insufficient documentation

## 2016-11-16 DIAGNOSIS — Z170 Estrogen receptor positive status [ER+]: Secondary | ICD-10-CM | POA: Diagnosis not present

## 2016-11-16 DIAGNOSIS — E119 Type 2 diabetes mellitus without complications: Secondary | ICD-10-CM | POA: Diagnosis not present

## 2016-11-16 DIAGNOSIS — C50511 Malignant neoplasm of lower-outer quadrant of right female breast: Secondary | ICD-10-CM | POA: Diagnosis not present

## 2016-11-16 DIAGNOSIS — G4700 Insomnia, unspecified: Secondary | ICD-10-CM | POA: Diagnosis not present

## 2016-11-16 DIAGNOSIS — M8588 Other specified disorders of bone density and structure, other site: Secondary | ICD-10-CM

## 2016-11-16 LAB — CBC WITH DIFFERENTIAL/PLATELET
BASO%: 0.1 % (ref 0.0–2.0)
BASOS ABS: 0 10*3/uL (ref 0.0–0.1)
EOS ABS: 0.2 10*3/uL (ref 0.0–0.5)
EOS%: 2.6 % (ref 0.0–7.0)
HCT: 37.6 % (ref 34.8–46.6)
HEMOGLOBIN: 12.4 g/dL (ref 11.6–15.9)
LYMPH#: 1.7 10*3/uL (ref 0.9–3.3)
LYMPH%: 22.3 % (ref 14.0–49.7)
MCH: 30.7 pg (ref 25.1–34.0)
MCHC: 33 g/dL (ref 31.5–36.0)
MCV: 93.1 fL (ref 79.5–101.0)
MONO#: 0.7 10*3/uL (ref 0.1–0.9)
MONO%: 8.4 % (ref 0.0–14.0)
NEUT#: 5.2 10*3/uL (ref 1.5–6.5)
NEUT%: 66.6 % (ref 38.4–76.8)
PLATELETS: 316 10*3/uL (ref 145–400)
RBC: 4.04 10*6/uL (ref 3.70–5.45)
RDW: 13.9 % (ref 11.2–14.5)
WBC: 7.8 10*3/uL (ref 3.9–10.3)

## 2016-11-16 LAB — COMPREHENSIVE METABOLIC PANEL
ALBUMIN: 3.8 g/dL (ref 3.5–5.0)
ALK PHOS: 122 U/L (ref 40–150)
ALT: 29 U/L (ref 0–55)
ANION GAP: 10 meq/L (ref 3–11)
AST: 20 U/L (ref 5–34)
BUN: 12.9 mg/dL (ref 7.0–26.0)
CO2: 25 mEq/L (ref 22–29)
CREATININE: 0.8 mg/dL (ref 0.6–1.1)
Calcium: 9.6 mg/dL (ref 8.4–10.4)
Chloride: 103 mEq/L (ref 98–109)
EGFR: 75 mL/min/{1.73_m2} — ABNORMAL LOW (ref >90)
Glucose: 282 mg/dl — ABNORMAL HIGH (ref 70–140)
POTASSIUM: 4 meq/L (ref 3.5–5.1)
Sodium: 137 mEq/L (ref 136–145)
Total Bilirubin: 0.3 mg/dL (ref 0.20–1.20)
Total Protein: 7.1 g/dL (ref 6.4–8.3)

## 2016-11-16 NOTE — Telephone Encounter (Signed)
Gave patient avs report and appointments for June.  °

## 2016-11-23 ENCOUNTER — Other Ambulatory Visit: Payer: Self-pay | Admitting: Hematology

## 2016-11-23 DIAGNOSIS — C50511 Malignant neoplasm of lower-outer quadrant of right female breast: Secondary | ICD-10-CM

## 2016-11-23 DIAGNOSIS — Z170 Estrogen receptor positive status [ER+]: Principal | ICD-10-CM

## 2016-11-25 ENCOUNTER — Telehealth: Payer: Self-pay | Admitting: Hematology

## 2016-11-25 ENCOUNTER — Other Ambulatory Visit: Payer: Self-pay | Admitting: Family Medicine

## 2016-11-25 NOTE — Telephone Encounter (Signed)
lvm to inform pt of SCP appt in Aug per LOS

## 2016-12-17 ENCOUNTER — Other Ambulatory Visit: Payer: Self-pay | Admitting: Hematology

## 2016-12-17 DIAGNOSIS — Z170 Estrogen receptor positive status [ER+]: Principal | ICD-10-CM

## 2016-12-17 DIAGNOSIS — C50511 Malignant neoplasm of lower-outer quadrant of right female breast: Secondary | ICD-10-CM

## 2017-01-17 ENCOUNTER — Other Ambulatory Visit: Payer: Self-pay | Admitting: Physician Assistant

## 2017-01-17 DIAGNOSIS — K219 Gastro-esophageal reflux disease without esophagitis: Secondary | ICD-10-CM

## 2017-01-27 ENCOUNTER — Other Ambulatory Visit: Payer: Self-pay | Admitting: Family Medicine

## 2017-02-07 NOTE — Progress Notes (Signed)
[place]  [phone] Fax:([phone]  Clinic Follow Up Note   Patient Care Team: [name], MD as PCP - General (Family Medicine) [name], MD as Consulting Physician (General Surgery) [name], MD as Consulting Physician (Hematology) [name], MD as Attending Physician (Radiation Oncology) 02/16/2017  CHIEF COMPLAINTS:  Follow up for right multifocal breast cancer  Oncology History   Malignant neoplasm of lower-outer quadrant of right female breast [facility])   Staging form: Breast, AJCC 7th Edition   - Clinical stage from 06/23/2016: Stage IA (T1c, N0, M0) - Unsigned      Malignant neoplasm of lower-outer quadrant of right female breast ([place])   04/28/2015 Imaging    Bone Density Report 04/28/2015 AP Spine (L1-L4): T-Score = -1.7. Osteopenia Femoral Neck (Left) : T-Score = -1.0 Normal Femoral Neck (Right): T-Score = 0.4 Normal      03/01/2016 Mammogram    Bilateral screening mammogram was negative      06/08/2016 Imaging    Bilateral breast MRI with and without contrast showed 4 lesions in the right breast, 2 adjacent 1.4 cm mass in the inferior aspect of the right breast, and in the right lateral aspect of the breast, there are 8 mm and a 7 mm irregular enhancing mass. Multiple small enhancing lesions within the liver, indeterminate. No axillary adenopathy.      06/16/2016 Initial Diagnosis    Malignant neoplasm of lower-outer quadrant of right female breast ([place])      06/16/2016 Initial Biopsy    Right breast needle core biopsy at 9 clock, 6:30 clock, 6 clock showed invasive bladder carcinoma, and LCIS      06/16/2016 Receptors her2    two right breast biopsy showed ER strongly positive, PR strongly positive, HER-2 negative, Ki-67 5-10%       06/16/2016 Imaging    Right breast and axilla ultrasound showed a 1.4 cm mass at the 6:30 o'clock, 1.0 cm mass at the 6:00, and 66m mass at 9:00 position. Axilla was  negative.      06/28/2016 Imaging    MRI ABDOMEN W W/O CONTRAST 06/28/2016 IMPRESSION: 1. Multiple hepatic hemangiomas.  No suspicious liver lesion. 2. Hepatic steatosis. 3. No evidence of abdominal metastasis.      06/28/2016 Genetic Testing    Patient has genetic testing done for breast cancer. Results revealed patient has the following variant called, MSH2 c.2650A>T.      08/13/2016 Surgery    Bilateral simple mastectomy and right SLN biopsy        08/13/2016 Pathology Results    Right mastectomy showed invasive ductal carcinoma, G2, multifocal, 1.5cm, 1.5cm, and 1.0cm. (+) LCIS, margins are negative, 1 sentinel lymph node was negative. Left simple mastectomy showed LCIS, ADH, no malignancy.       08/13/2016 Oncotype testing    Oncotype recurrence score 11, which predicts 10 year distant recurrence risk of 7% with tamoxifen alone, low risk category. No adjuvant chemo recommended based on this.       09/25/2016 -  Anti-estrogen oral therapy    Adjuvant exemestane '25mg'$  daily       11/05/2016 Surgery    REMOVAL OF BILATERAL TISSUE EXPANDERS WITH PLACEMENT OF BILATERAL BREAST SALINE IMPLANTS by Dr. TIran Planas       HISTORY OF PRESENTING ILLNESS: (06/23/2016) PForbes Cellar89y.o. female is here because of her newly diagnosed right breast cancer. She is accompanied by her husband to our multidisciplinary clinic today.  She had right  breast biopsy in [facility] which was negative for malignancy. She also had right breast lumpectomy in 2009 for benign lesion. Her last screening mammogram was negative in June 2017. Due to her family history of breast cancer, her primary care physician felt she is at high risk for breast cancer, and referred her for breast MRI on 06/08/2016, which showed 4 small lesions in the right breast. She underwent ultrasound of the right breast and axilla, which showed 2 adjacent breast mass at 6:00 position, and a smaller mass at 9 clock position, axillary was  negative. All of the 3 right breast lesions were biopsied under ultrasound guidance, which all showed invasive lobular carcinoma and LCIS. He appears dry positive, HER-2 negative.  She feels well, denies any significant pain, except arthritis pain in the left hip, manageable. She has good appetite and energy level, works full-time at [employer]. She is married, lives with her husband. Her paternal grandmother and paternal cousin had breast cancer in their 72s.  GYN HISTORY  Menarchal: 11  LMP: 61 yo  Contraceptive: no  HRT: no  G5P1: one daughter 62 yo   CURRENT THERAPY: Exemestane (Aromasin) 25 mg daily started on September 25, 2016  INTERVAL HISTORY: Ms. Erlandson returns for follow up. She presents to the clinic today. With Exemestane she reports to having body oder and sweating. She has used alcohol wipes and dial antimicrobial soap and her body order still remains. She has osteoporosis in her left hip. She is able to walk and tolerate the pain. Since on Exemestane her pain has not differed.  She reports she is on Metformin and her PCP will stop her metformin once her sugar is controlled.  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 Her 62 year old father fell and broke his pelvic bone.    MEDICAL HISTORY:  Past Medical History:  Diagnosis Date  . Anxiety   . Arthritis    OA hips  . Asthma    allergy and exercise induced asthma   . Depression   . Diabetes mellitus without complication (HCC)   . Family history of breast cancer   . Family history of colon cancer   . Family history of stomach cancer   . GERD (gastroesophageal reflux disease)   . Hypertension   . Malignant neoplasm of lower-outer quadrant of right female breast (HCC) 06/21/2016  . Seasonal allergies     SURGICAL HISTORY: Past Surgical History:  Procedure Laterality Date  . BREAST LUMPECTOMY     right   . BREAST RECONSTRUCTION WITH  PLACEMENT OF TISSUE EXPANDER AND FLEX HD (ACELLULAR HYDRATED DERMIS) Bilateral 08/13/2016   Procedure: BREAST RECONSTRUCTION WITH PLACEMENT OF TISSUE EXPANDER AND ALLODERM;  Surgeon: Glenna Fellows, MD;  Location: [facility];  Service: Plastics;  Laterality: Bilateral;  . DILATION AND CURETTAGE OF UTERUS    . JOINT REPLACEMENT    . LIPOSUCTION WITH LIPOFILLING Bilateral 11/05/2016   Procedure: LIPOFILLING FROM ABDOMEN TO BILATERAL CHEST;  Surgeon: Glenna Fellows, MD;  Location: [facility];  Service: Plastics;  Laterality: Bilateral;  . MASTECTOMY W/ SENTINEL NODE BIOPSY Bilateral 08/13/2016   Procedure: BILATERAL TOTAL MASTECTOMY WITH RIGHT AXILLARY SENTINEL LYMPH NODE BIOPSY;  Surgeon: Glenna Fellows, MD;  Location: [facility];  Service: General;  Laterality: Bilateral;  . REMOVAL OF BILATERAL TISSUE EXPANDERS WITH PLACEMENT OF BILATERAL BREAST IMPLANTS Bilateral 11/05/2016   Procedure: REMOVAL OF BILATERAL  TISSUE EXPANDERS WITH PLACEMENT OF BILATERAL BREAST SALINE IMPLANTS;  Surgeon: Glenna Fellows, MD;  Location: [facility];  Service: Plastics;  Laterality: Bilateral;  . TOTAL HIP ARTHROPLASTY Right 01/07/2016   Procedure: TOTAL HIP ARTHROPLASTY ANTERIOR APPROACH;  Surgeon: Ollen Gross, MD;  Location: [facility];  Service: Orthopedics;  Laterality: Right;    SOCIAL HISTORY: Social History   Social History  . Marital status: Married    Spouse name: N/A  . Number of children: 1  . Years of education: N/A   Occupational History  . Not on file.   Social History Main Topics  . Smoking status: Never Smoker  . Smokeless tobacco: Never Used  . Alcohol use No  . Drug use: No  . Sexual activity: Yes   Other Topics Concern  . Not on file   Social History Narrative  . No narrative on file    FAMILY HISTORY: Family History  Problem Relation Age of Onset  . Arthritis Mother   . Asthma Mother   . Diabetes Mother   .  Hyperlipidemia Mother   . Hypertension Mother   . Arthritis Father   . Asthma Father   . Stroke Father   . Hyperlipidemia Father   . Early death Brother 8       Muscular Dystrophy  . Birth defects Brother   . Cancer Maternal Uncle        possibly stomach  . Mental retardation Maternal Uncle   . Cancer Paternal Grandmother 77       breast cancer   . Cancer Cousin 40       paternal cousin's daughter with breast cancer  . Colon cancer Maternal Aunt        dx in her 22s  . Leukemia Paternal Uncle   . Lymphoma Paternal Uncle   . Down syndrome Paternal Uncle     ALLERGIES:  is allergic to azithromycin; biaxin [clarithromycin]; and codeine.  MEDICATIONS:  Current Outpatient Prescriptions  Medication Sig Dispense Refill  . Acetaminophen (TYLENOL ARTHRITIS PAIN PO) Take 2 tablets by mouth as needed.     Marland Kitchen albuterol (PROVENTIL HFA;VENTOLIN HFA) 108 (90 Base) MCG/ACT inhaler Inhale 2 puffs into the lungs every 6 (six) hours as needed for wheezing or shortness of breath.    . benazepril (LOTENSIN) 20 MG tablet TAKE (1) TABLET BY MOUTH TWICE DAILY. 60 tablet 11  . escitalopram (LEXAPRO) 20 MG tablet Take 1 tablet (20 mg total) by mouth daily. 30 tablet 11  . exemestane (AROMASIN) 25 MG tablet TAKE ONE TABLET BY MOUTH ONCE DAILY AFTER BREAKFAST. 30 tablet 1  . fexofenadine (ALLEGRA) 180 MG tablet Take 180 mg by mouth daily.    . metFORMIN (GLUCOPHAGE) 500 MG tablet TAKE 1 TABLET BY MOUTH TWICE DAILY WITH MEALS. 60 tablet 0  . omeprazole (PRILOSEC) 20 MG capsule TAKE (1) CAPSULE BY MOUTH ONCE DAILY. 30 capsule 0   No current facility-administered medications for this visit.     REVIEW OF SYSTEMS:  Constitutional: Denies fevers, chills or abnormal night sweats (+) hot flashes and lingering body odor  Eyes: Denies blurriness of vision, double vision or watery eyes Ears, nose, mouth, throat, and face: Denies mucositis or sore throat Respiratory: Denies cough, dyspnea or  wheezes Cardiovascular: Denies palpitation, chest discomfort or lower extremity swelling Gastrointestinal:  Denies nausea, heartburn or change in bowel habits Skin: Denies abnormal skin rashes Lymphatics: Denies new lymphadenopathy or easy bruising Neurological:Denies numbness, tingling or new weaknesses MSK: (+) osteoporsis  in right hip and joint.  Behavioral/Psych: Mood is stable, no new changes  All other systems were reviewed with the patient and are negative. Breast: (+) feels tightness around breast from  reconstruction  PHYSICAL EXAMINATION: ECOG PERFORMANCE STATUS: 0 - Asymptomatic  Vitals:   02/16/17 1323  BP: (!) 150/67  Pulse: 66  Resp: 19  Temp: 98.3 F (36.8 C)   Filed Weights   02/16/17 1323  Weight: 176 lb 14.4 oz (80.2 kg)    GENERAL:alert, no distress and comfortable SKIN: skin color, texture, turgor are normal, no rashes or significant lesions EYES: normal, conjunctiva are pink and non-injected, sclera clear OROPHARYNX:no exudate, no erythema and lips, buccal mucosa, and tongue normal. NECK: supple, thyroid normal size, non-tender, without nodularity LYMPH:  no palpable lymphadenopathy in the cervical, axillary or inguinal LUNGS: clear to auscultation and percussion with normal breathing effort HEART: regular rate & rhythm and no murmurs and no lower extremity edema ABDOMEN:abdomen soft, non-tender and normal bowel sounds Musculoskeletal:no cyanosis of digits and no clubbing  PSYCH: alert & oriented x 3 with fluent speech NEURO: no focal motor/sensory deficits  Breasts: (+) Bilateral mastectomy with implants. Incisions healed well. No palpable mass.   LABORATORY DATA:  I have reviewed the data as listed CBC Latest Ref Rng & Units 02/17/2017 02/16/2017 11/16/2016  WBC 3.8 - 10.8 K/uL 4.6 5.7 7.8  Hemoglobin 12.0 - 15.0 g/dL 13.2 13.8 12.4  Hematocrit 35.0 - 45.0 % 40.9 42.5 37.6  Platelets 140 - 400 K/uL 346 349 316   CMP Latest Ref Rng & Units 02/17/2017  02/16/2017 11/16/2016  Glucose 70 - 99 mg/dL 114(H) 84 282(H)  BUN 7 - 25 mg/dL 15 11.5 12.9  Creatinine 0.50 - 0.99 mg/dL 0.67 0.8 0.8  Sodium 135 - 146 mmol/L 138 141 137  Potassium 3.5 - 5.3 mmol/L 4.3 4.6 4.0  Chloride 98 - 110 mmol/L 102 - -  CO2 20 - 31 mmol/L 26 30(H) 25  Calcium 8.6 - 10.4 mg/dL 9.4 10.6(H) 9.6  Total Protein 6.1 - 8.1 g/dL 6.9 8.2 7.1  Total Bilirubin 0.2 - 1.2 mg/dL 0.4 0.50 0.30  Alkaline Phos 33 - 130 U/L 104 123 122  AST 10 - 35 U/L '16 17 20  '$ ALT 6 - 29 U/L '22 26 29   '$ PATHOLOGY REPORT  ONCOTYPE DX 08/13/2016   Diagnosis 08/13/2016 1. Breast, simple mastectomy, Left - LOBULAR NEOPLASIA (LOBULAR CARCINOMA IN SITU). - ATYPICAL DUCTAL HYPERPLASIA. - FLAT EPITHELIAL ATYPIA. - FIBROCYSTIC CHANGES WITH ADENOSIS AND CALCIFICATIONS. 2. Breast, simple mastectomy, Right - INVASIVE LOBULAR CARCINOMA, GRADE II/III, MULTIPLE FOCI, SPANNING 1.5 CM, 1.5 CM AND 1.0 CM. - LOBULAR CARCINOMA IN SITU. - THE SURGICAL RESECTION MARGINS ARE NEGATIVE FOR INVASIVE CARCINOMA. - SEE ONCOLOGY TABLE BELOW. 3. Lymph node, sentinel, biopsy, Right - THERE IS NO EVIDENCE OF CARCINOMA IN 1 OF 1 LYMPH NODE (0/1). - SEE COMMENT. Microscopic Comment 2. BREAST, INVASIVE TUMOR, WITH LYMPH NODES PRESENT Specimen, including laterality and lymph node sampling (sentinel, non-sentinel): Right breast and right axillary lymph node. Procedure: Simple mastectomy and axillary lymph node resection x1. Histologic type: Lobular Grade: II Tubule formation: 3 Nuclear pleomorphism: 2 Mitotic:2 Tumor size (gross measurements): 1.5 cm, 1.5 cm, and 1.0 cm Margins: Greater than 0.2 cm to all margins. Lymphovascular invasion: Not identified. Ductal carcinoma in situ: Not identified. Tumor focality: At least three foci. Extent of tumor: Confined to breast parenchyma. Lymph nodes: 1 of 3 FINAL for SANSKRITI, GREENLAW ([id number]) Microscopic Comment(continued) Examined:  1 Sentinel 0 Non-sentinel 1  Total Lymph nodes with metastasis: 0 Breast prognostic profile: Estrogen receptor: Positive. Progesterone receptor: Positive. Her 2 neu: Negative. Ki-67: 5% - 10% Non-neoplastic breast: Fibrocystic changes with adenosis and calcifications. TNM: mpT1c, pN0 Comments: There are three nodules of similar appearing invasive lobular carcinoma with associated lobular carcinoma in situ. The carcinoma cells are negative for E-cadherin, supporting a lobular phenotype. 3. Immunohistochemical stains performed on two blocks of part 3 fail to highlight the presence of cytokeratin positive tumor cells. (JBK:ecj 08/17/2016) Pecola Leisure MD Pathologist, Electronic Signature (Case signed 08/17/2016)  Genetic Testing 06/28/2016 Genetic testing did identify a variant called, MSH2 c.2650A>T.  Diagnosis 06/16/2016 1. Breast, right, needle core biopsy, 9 o'clock - INVASIVE LOBULAR CARCINOMA. - LOBULAR CARCINOMA IN SITU. - SEE COMMENT. 2. Breast, right, needle core biopsy, 6:30 o'clock - INVASIVE LOBULAR CARCINOMA. - LOBULAR CARCINOMA IN SITU. - SEE COMMENT. 3. Breast, right, needle core biopsy, 6 o'clock - INVASIVE LOBULAR CARCINOMA. - LOBULAR CARCINOMA IN SITU. - SEE COMMENT. Microscopic Comment 1. Smooth muscle myosin, p63 and calponin stains are performed on the first specimen (right 9 o'clock needle core biopsy). The stains confirm the presence of both invasive and in situ carcinoma. An E-cadherin immunohistochemical stain is also performed. This additional stain is negative in both the invasive and in situ components confirming a lobular phenotype. Although definitive grading of breast carcinoma is best done on excision, the features of the invasive tumor from the right 9 o'clock breast biopsy are compatible with a grade 2 breast carcinoma. Breast prognostic markers will be performed and reported in an addendum. Findings are called to the [facility] on 06/18/16. Dr. Valinda Hoar  has seen this specimen in consultation with agreement. 2. Smooth muscle myosin, p63 and calponin stains are performed on the second specimen (right 6:30 o'clock needle core biopsy). The stains confirm the presence of both invasive and in situ carcinoma. An E-cadherin immunohistochemical stain is also performed. This additional stain is negative in both the invasive and in situ components confirming a lobular phenotype. Although definitive grading of breast carcinoma is best done on excision, the features of the invasive tumor from the right 6:30 o'clock biopsy are compatible with a grade 2 breast carcinoma. Breast prognostic markers will be performed and reported in an addendum. Findings are called to the [facility] on 06/18/16. Dr. Valinda Hoar has seen this specimen in consultation with agreement. 3. An E-cadherin immunohistochemical stain is performed on the third specimen (right 6 o'clock breast biopsy). The stain fails to demonstrate positivity in the invasive or in situ component confirming the lobular nature of both. Although definitive grading of breast carcinoma is best done on excision, the features of the invasive tumor from the right 6 o'clock breast biopsy are compatible with a grade 2 breast carcinoma. Breast prognostic markers will be performed and reported in an addendum. Findings are called to the [facility] on 06/18/16. Dr. Valinda Hoar has seen this specimen in consultation with agreement. (RAH:gt, 06/18/16) Zandra Abts MD  1. Results: IMMUNOHISTOCHEMICAL AND MORPHOMETRIC ANALYSIS PERFORMED MANUALLY Estrogen Receptor: 95%, POSITIVE, STRONG STAINING INTENSITY Progesterone Receptor: 95%, POSITIVE, STRONG STAINING INTENSITY Proliferation Marker Ki67: 10%  Results: HER2 - NEGATIVE RATIO OF HER2/CEP17 SIGNALS 1.16 AVERAGE HER2 COPY NUMBER PER CELL 2.20  2. PROGNOSTIC INDICATORS Insufficient tissue   3. PROGNOSTIC  INDICATORS Results: IMMUNOHISTOCHEMICAL AND MORPHOMETRIC ANALYSIS PERFORMED MANUALLY Estrogen Receptor: 100%, POSITIVE, STRONG STAINING INTENSITY Progesterone Receptor: 100%, POSITIVE, STRONG STAINING  INTENSITY Proliferation Marker Ki67: 5% Results: HER2 - NEGATIVE RATIO OF HER2/CEP17 SIGNALS 1.19 AVERAGE HER2 COPY NUMBER PER CELL 3.15   RADIOGRAPHIC STUDIES: I have personally reviewed the radiological images as listed and agreed with the findings in the report. No results found.  ASSESSMENT & PLAN: 61 y.o. Caucasian postmenopausal woman, presented image discovered multifocal right breast masses.  1. Malignant neoplasm of lower-outer quadrant of right female breast, invasive lobular carcinoma and LCIS, mpT1cN0M0, stage IA, G2, ER+/PR+/HER2-, oncotype low risk  -I reviewed her surgical pathology findings with patient in details. Her surgical margins was negative, 1 sentinel lymph nodes was negative. She had stage I disease. -Oncotype DX score was 11 with the 10 year risk of distant recurrence with tamoxifen alone is 7%. This is at low risk. Therefore I do not recommend chemotherapy.  -she is on adjuvant exemestane, has mild insomnia, otherwise tolerating well. Plan for total 5 years. -She had a bilateral mastectomy, no routine mammogram needed -The patient underwent reconstruction with saline implants on 11/05/16 by Dr. Leta Baptist. -continue healthy diet and exercise regularly. -We'll continue breast cancer surveillance with routine lab, exam and follow-up -She is due for bone density scan. Pt will schedule [facility] at [facility].  -Labs reviewed and her calcium levels are elevated at 10.6 today. I suggest to monitor her calcium levels and drink more water for hydration.  -Will f/u in 4 months then every 6 months afterward.     2. Genetics -She has 2 family members who had breast cancer at a young age, she will qualify for genetic testing, we'll refer her. -Blood sample sent on  06/28/16. -Genetic testing did identify a VUS in MSH2, c.2650A>T.  3. DM, type 2 -She is on metformin now, her energy sugar was 282 today, she does not control her diet. -I previously advised the patient to check her glucose at home. -previously Advised low carb diet and exercise. -She'll follow-up with her primary care physician. -Sugar has improved lately. I suggested she continue to watch her diet and exercise to control her DM.   4. Arthritis -She had a total right hip replacement, has mild arthralgia in the left hip, manageable. -We previously discussed that adjuvant hormonal therapy could make this worse.  5. Osteopenia -Bone scan 04/28/15 revealed AP Spine (L1-L4) T-Score = -1.7. -We discussed that exemestane may weak bone -I previously advised the patient to take Calcium and vitamin D. Will check her Vit D level yearly  -previously Advised the patient to exercise to strengthen her bones. -Due to elevated calcium seen 02/16/17 I suggest she hold calcium and vitamin D to monitor her levels and drink plenty of water.   6. Insomnia  -I previously encouraged her to try melatonin or Benadryl for her insomnia   PLAN -Continue Exemestane. -Lab and f/u in 4 months then every 6 months    No orders of the defined types were placed in this encounter.   All questions were answered. The patient knows to call the clinic with any problems, questions or concerns.  I spent 25 minutes counseling the patient face to face. The total time spent in the appointment was 30 minutes and more than 50% was on counseling.     Malachy Mood, MD 02/16/2017  This document serves as a record of services personally performed by Malachy Mood, MD. It was created on her behalf by Delphina Cahill, a trained medical scribe. The creation of this record is based on the scribe's personal observations and the provider's  statements to them. This document has been checked and approved by the attending provider.

## 2017-02-11 ENCOUNTER — Encounter: Payer: Self-pay | Admitting: Family Medicine

## 2017-02-11 ENCOUNTER — Ambulatory Visit (INDEPENDENT_AMBULATORY_CARE_PROVIDER_SITE_OTHER): Payer: BC Managed Care – PPO | Admitting: Family Medicine

## 2017-02-11 VITALS — BP 118/78 | HR 68 | Temp 98.4°F | Resp 18 | Ht 63.0 in | Wt 180.0 lb

## 2017-02-11 DIAGNOSIS — I10 Essential (primary) hypertension: Secondary | ICD-10-CM

## 2017-02-11 DIAGNOSIS — E119 Type 2 diabetes mellitus without complications: Secondary | ICD-10-CM

## 2017-02-11 NOTE — Progress Notes (Signed)
Subjective:    Patient ID: Carolyn Glass, female    DOB: 1955/09/19, 61 y.o.   MRN: [id number]  HPI  05/06/16 Patient is a very pleasant 61 year old white female here today to establish care. Her mammogram is up-to-date. It is been 10 years since her last colonoscopy. She already has an appointment to see her gastroenterologist at the first of next year to have this performed. She sees a gynecologist for her Pap smears. She has had Prevnar 13. She is due for the shingles vaccine as well as Pneumovax 23. We discussed these today but she defers them. Past medical history is significant for borderline type 2 diabetes mellitus for which she takes metformin. She also has hypertension. In the past she's had a history of anxiety for which she takes Lexapro 20 mg a day. 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 Otherwise she is doing well with no concerns. She recently had a right-sided total hip replacement. She also has severe osteoarthritis in her left hip but she is deferring surgery at the present time on this. She also has a history of acid reflux for which she takes omeprazole. However the majority of her symptoms began after she started taking NSAIDs for her hip pain.  At that time, my plan was: Her blood pressure today is well controlled. She is requesting assistance with weight loss. She would like to come off metformin area and I recommended that she return fasting for hemoglobin A1c area and hemoglobin A1c reveals that she needs to continue some type of medication, I would recommend discontinuing metformin and possibly switching to Trulicity to help manage her diabetes and also assist in weight loss. I will also like to check a fasting lipid panel. Goal LDL cholesterol is less than 100. I would also like to check a urine microalbumin. She is due for hepatitis C screening. I recommended a shingles vaccine as well as Pneumovax 23  but she declined both. Her cancer screening is up-to-date. Her bone density is up-to-date.  06/24/16 Hemoglobin A1c was 6.7. Patient independently decided to discontinue metformin and replaced it with no other medication. Shortly thereafter she was diagnosed with breast cancer in her right breast. During the process of preoperative evaluation, she was found to have a random blood sugar of 62 and was referred back to me. Her hemoglobin A1c was 6.7 in early September. Shortly thereafter she stopped the metformin. Prior to stopping the metformin it was working well. We had a 20 minute discussion today involving the medication options that she has.  At that time, my plan was: The patient elects to resume her metformin 500 mg by mouth twice a day and recheck blood sugars in 6 weeks.  02/11/17 Patient had not been seen since. In the interval time, she was diagnosed with breast cancer and underwent lumpectomy and reconstructive surgery. She is here today to follow-up her diabetes. She is not checking her sugars regularly. She denies any polyuria, polydipsia, or blurred vision. She denies any neuropathy. She denies any chest pain shortness of breath or dyspnea on exertion. She is taking metformin 500 mg by mouth twice a day. Her blood pressure today is well controlled at 118/78 Past Medical History:  Diagnosis Date  . Anxiety   . Arthritis    OA hips  . Asthma    allergy and exercise induced asthma   . Depression   . Diabetes mellitus without complication (  [place])   . Family history of breast cancer   . Family history of colon cancer   . Family history of stomach cancer   . GERD (gastroesophageal reflux disease)   . Hypertension   . Malignant neoplasm of lower-outer quadrant of right female breast ([place]) 06/21/2016  . Seasonal allergies    Past Surgical History:  Procedure Laterality Date  . BREAST LUMPECTOMY     right   . BREAST RECONSTRUCTION WITH PLACEMENT OF TISSUE EXPANDER AND FLEX HD (ACELLULAR  HYDRATED DERMIS) Bilateral 08/13/2016   Procedure: BREAST RECONSTRUCTION WITH PLACEMENT OF TISSUE EXPANDER AND ALLODERM;  Surgeon: Irene Limbo, MD;  Location: [facility];  Service: Plastics;  Laterality: Bilateral;  . DILATION AND CURETTAGE OF UTERUS    . JOINT REPLACEMENT    . LIPOSUCTION WITH LIPOFILLING Bilateral 11/05/2016   Procedure: LIPOFILLING FROM ABDOMEN TO BILATERAL CHEST;  Surgeon: Irene Limbo, MD;  Location: [facility];  Service: Plastics;  Laterality: Bilateral;  . MASTECTOMY W/ SENTINEL NODE BIOPSY Bilateral 08/13/2016   Procedure: BILATERAL TOTAL MASTECTOMY WITH RIGHT AXILLARY SENTINEL LYMPH NODE BIOPSY;  Surgeon: [name], MD;  Location: [facility];  Service: General;  Laterality: Bilateral;  . REMOVAL OF BILATERAL TISSUE EXPANDERS WITH PLACEMENT OF BILATERAL BREAST IMPLANTS Bilateral 11/05/2016   Procedure: REMOVAL OF BILATERAL TISSUE EXPANDERS WITH PLACEMENT OF BILATERAL BREAST SALINE IMPLANTS;  Surgeon: Irene Limbo, MD;  Location: [facility];  Service: Plastics;  Laterality: Bilateral;  . TOTAL HIP ARTHROPLASTY Right 01/07/2016   Procedure: TOTAL HIP ARTHROPLASTY ANTERIOR APPROACH;  Surgeon: Gaynelle Arabian, MD;  [facility];  Service: Orthopedics;  Laterality: Right;   Current Outpatient Prescriptions on File Prior to Visit  Medication Sig Dispense Refill  . Acetaminophen (TYLENOL ARTHRITIS PAIN PO) Take 2 tablets by mouth as needed.     Marland Kitchen albuterol (PROVENTIL HFA;VENTOLIN HFA) 108 (90 Base) MCG/ACT inhaler Inhale 2 puffs into the lungs every 6 (six) hours as needed for wheezing or shortness of breath.    . benazepril (LOTENSIN) 20 MG tablet TAKE (1) TABLET BY MOUTH TWICE DAILY. 60 tablet 11  . escitalopram (LEXAPRO) 20 MG tablet Take 1 tablet (20 mg total) by mouth daily. 30 tablet 11  . exemestane (AROMASIN) 25 MG tablet TAKE ONE TABLET BY MOUTH ONCE DAILY AFTER BREAKFAST. 30 tablet 1  .  fexofenadine (ALLEGRA) 180 MG tablet Take 180 mg by mouth daily.    . metFORMIN (GLUCOPHAGE) 500 MG tablet TAKE 1 TABLET BY MOUTH TWICE DAILY WITH MEALS. 60 tablet 0  . omeprazole (PRILOSEC) 20 MG capsule TAKE (1) CAPSULE BY MOUTH ONCE DAILY. 30 capsule 0  . oxyCODONE (ROXICODONE) 5 MG immediate release tablet Take 1-2 tablets (5-10 mg total) by mouth every 4 (four) hours as needed for severe pain. 30 tablet 0   No current facility-administered medications on file prior to visit.    Allergies  Allergen Reactions  . Azithromycin Itching and Rash  . Biaxin [Clarithromycin] Rash  . Codeine Rash    Thinks a codeine cough syrup caused rash.   Social History   Social History  . Marital status: Married    Spouse name: N/A  . Number of children: 1  . Years of education: N/A   Occupational History  . Not on file.   Social History Main Topics  . Smoking status: Never Smoker  . Smokeless tobacco: Never Used  . Alcohol use No  . Drug use: No  . Sexual activity: Yes  Other Topics Concern  . Not on file   Social History Narrative  . No narrative on file   Family History  Problem Relation Age of Onset  . Arthritis Mother   . Asthma Mother   . Diabetes Mother   . Hyperlipidemia Mother   . Hypertension Mother   . Arthritis Father   . Asthma Father   . Stroke Father   . Hyperlipidemia Father   . Early death Brother 8       Muscular Dystrophy  . Birth defects Brother   . Cancer Maternal Uncle        possibly stomach  . Mental retardation Maternal Uncle   . Cancer Paternal Grandmother 78       breast cancer   . Cancer Cousin 48       paternal cousin's daughter with breast cancer  . Colon cancer Maternal Aunt        dx in her 30s  . Leukemia Paternal Uncle   . Lymphoma Paternal Uncle   . Down syndrome Paternal Uncle       Review of Systems  All other systems reviewed and are negative.      Objective:   Physical Exam  Constitutional: She is oriented to person,  place, and time. She appears well-developed and well-nourished. No distress.  HENT:  Head: Normocephalic and atraumatic.  Right Ear: External ear normal.  Left Ear: External ear normal.  Nose: Nose normal.  Mouth/Throat: Oropharynx is clear and moist. No oropharyngeal exudate.  Eyes: Conjunctivae and EOM are normal. Pupils are equal, round, and reactive to light. Right eye exhibits no discharge. Left eye exhibits no discharge. No scleral icterus.  Neck: Normal range of motion. Neck supple. No JVD present. No tracheal deviation present. No thyromegaly present.  Cardiovascular: Normal rate, regular rhythm, normal heart sounds and intact distal pulses.  Exam reveals no gallop and no friction rub.   No murmur heard. Pulmonary/Chest: Effort normal and breath sounds normal. No stridor. No respiratory distress. She has no wheezes. She has no rales. She exhibits no tenderness.  Abdominal: Soft. Bowel sounds are normal. She exhibits no distension and no mass. There is no tenderness. There is no rebound and no guarding.  Musculoskeletal: Normal range of motion. She exhibits no edema, tenderness or deformity.  Lymphadenopathy:    She has no cervical adenopathy.  Neurological: She is alert and oriented to person, place, and time. She has normal reflexes. No cranial nerve deficit. She exhibits normal muscle tone. Coordination normal.  Skin: Skin is warm. No rash noted. She is not diaphoretic. No erythema. No pallor.  Psychiatric: She has a normal mood and affect. Her behavior is normal. Judgment and thought content normal.  Vitals reviewed.         Assessment & Plan:  Controlled type 2 diabetes mellitus without complication, without long-term current use of insulin ([place]) - Plan: CBC with Differential/Platelet, COMPLETE METABOLIC PANEL WITH GFR, Lipid panel, Hemoglobin A1c  Benign essential HTN Return fasting for a CMP, fasting lipid panel, and hemoglobin A1c. Goal hemoglobin A1c is less than 6.5.  Consider placing the patient on victoza to help weight loss depending on glycemic control.

## 2017-02-16 ENCOUNTER — Ambulatory Visit (HOSPITAL_BASED_OUTPATIENT_CLINIC_OR_DEPARTMENT_OTHER): Payer: BC Managed Care – PPO | Admitting: Hematology

## 2017-02-16 ENCOUNTER — Telehealth: Payer: Self-pay | Admitting: Hematology

## 2017-02-16 ENCOUNTER — Encounter: Payer: Self-pay | Admitting: Hematology

## 2017-02-16 ENCOUNTER — Other Ambulatory Visit (HOSPITAL_BASED_OUTPATIENT_CLINIC_OR_DEPARTMENT_OTHER): Payer: BC Managed Care – PPO

## 2017-02-16 VITALS — BP 150/67 | HR 66 | Temp 98.3°F | Resp 19 | Ht 63.0 in | Wt 176.9 lb

## 2017-02-16 DIAGNOSIS — C50511 Malignant neoplasm of lower-outer quadrant of right female breast: Secondary | ICD-10-CM

## 2017-02-16 DIAGNOSIS — M8588 Other specified disorders of bone density and structure, other site: Secondary | ICD-10-CM | POA: Diagnosis not present

## 2017-02-16 DIAGNOSIS — Z170 Estrogen receptor positive status [ER+]: Secondary | ICD-10-CM | POA: Diagnosis not present

## 2017-02-16 LAB — COMPREHENSIVE METABOLIC PANEL
ALBUMIN: 4.6 g/dL (ref 3.5–5.0)
ALK PHOS: 123 U/L (ref 40–150)
ALT: 26 U/L (ref 0–55)
ANION GAP: 9 meq/L (ref 3–11)
AST: 17 U/L (ref 5–34)
BILIRUBIN TOTAL: 0.5 mg/dL (ref 0.20–1.20)
BUN: 11.5 mg/dL (ref 7.0–26.0)
CALCIUM: 10.6 mg/dL — AB (ref 8.4–10.4)
CO2: 30 mEq/L — ABNORMAL HIGH (ref 22–29)
CREATININE: 0.8 mg/dL (ref 0.6–1.1)
Chloride: 102 mEq/L (ref 98–109)
EGFR: 82 mL/min/{1.73_m2} — ABNORMAL LOW (ref >90)
Glucose: 84 mg/dl (ref 70–140)
Potassium: 4.6 mEq/L (ref 3.5–5.1)
Sodium: 141 mEq/L (ref 136–145)
TOTAL PROTEIN: 8.2 g/dL (ref 6.4–8.3)

## 2017-02-16 LAB — CBC WITH DIFFERENTIAL/PLATELET
BASO%: 0.2 % (ref 0.0–2.0)
Basophils Absolute: 0 10*3/uL (ref 0.0–0.1)
EOS ABS: 0.1 10*3/uL (ref 0.0–0.5)
EOS%: 1.6 % (ref 0.0–7.0)
HEMATOCRIT: 42.5 % (ref 34.8–46.6)
HEMOGLOBIN: 13.8 g/dL (ref 11.6–15.9)
LYMPH#: 2 10*3/uL (ref 0.9–3.3)
LYMPH%: 34.6 % (ref 14.0–49.7)
MCH: 30.4 pg (ref 25.1–34.0)
MCHC: 32.5 g/dL (ref 31.5–36.0)
MCV: 93.6 fL (ref 79.5–101.0)
MONO#: 0.6 10*3/uL (ref 0.1–0.9)
MONO%: 10.2 % (ref 0.0–14.0)
NEUT%: 53.4 % (ref 38.4–76.8)
NEUTROS ABS: 3.1 10*3/uL (ref 1.5–6.5)
PLATELETS: 349 10*3/uL (ref 145–400)
RBC: 4.54 10*6/uL (ref 3.70–5.45)
RDW: 13.8 % (ref 11.2–14.5)
WBC: 5.7 10*3/uL (ref 3.9–10.3)

## 2017-02-16 NOTE — Telephone Encounter (Signed)
Scheduled appt per 6/13 los - lab and f./u in 4 months. Gave patient AVS and calender,.

## 2017-02-17 ENCOUNTER — Other Ambulatory Visit: Payer: BC Managed Care – PPO

## 2017-02-17 DIAGNOSIS — Z13220 Encounter for screening for lipoid disorders: Secondary | ICD-10-CM

## 2017-02-17 DIAGNOSIS — E119 Type 2 diabetes mellitus without complications: Secondary | ICD-10-CM

## 2017-02-17 DIAGNOSIS — Z0000 Encounter for general adult medical examination without abnormal findings: Secondary | ICD-10-CM

## 2017-02-17 LAB — COMPLETE METABOLIC PANEL WITH GFR
ALT: 22 U/L (ref 6–29)
AST: 16 U/L (ref 10–35)
Albumin: 4.3 g/dL (ref 3.6–5.1)
Alkaline Phosphatase: 104 U/L (ref 33–130)
BILIRUBIN TOTAL: 0.4 mg/dL (ref 0.2–1.2)
BUN: 15 mg/dL (ref 7–25)
CALCIUM: 9.4 mg/dL (ref 8.6–10.4)
CHLORIDE: 102 mmol/L (ref 98–110)
CO2: 26 mmol/L (ref 20–31)
CREATININE: 0.67 mg/dL (ref 0.50–0.99)
Glucose, Bld: 114 mg/dL — ABNORMAL HIGH (ref 70–99)
Potassium: 4.3 mmol/L (ref 3.5–5.3)
Sodium: 138 mmol/L (ref 135–146)
TOTAL PROTEIN: 6.9 g/dL (ref 6.1–8.1)

## 2017-02-17 LAB — CBC WITH DIFFERENTIAL/PLATELET
BASOS ABS: 0 {cells}/uL (ref 0–200)
BASOS PCT: 0 %
EOS ABS: 92 {cells}/uL (ref 15–500)
Eosinophils Relative: 2 %
HEMATOCRIT: 40.9 % (ref 35.0–45.0)
Hemoglobin: 13.2 g/dL (ref 12.0–15.0)
LYMPHS PCT: 37 %
Lymphs Abs: 1702 cells/uL (ref 850–3900)
MCH: 29.7 pg (ref 27.0–33.0)
MCHC: 32.3 g/dL (ref 32.0–36.0)
MCV: 91.9 fL (ref 80.0–100.0)
MONO ABS: 368 {cells}/uL (ref 200–950)
MONOS PCT: 8 %
MPV: 10.3 fL (ref 7.5–12.5)
Neutro Abs: 2438 cells/uL (ref 1500–7800)
Neutrophils Relative %: 53 %
PLATELETS: 346 10*3/uL (ref 140–400)
RBC: 4.45 MIL/uL (ref 3.80–5.10)
RDW: 14.8 % (ref 11.0–15.0)
WBC: 4.6 10*3/uL (ref 3.8–10.8)

## 2017-02-17 LAB — LIPID PANEL
CHOLESTEROL: 161 mg/dL (ref <200)
HDL: 42 mg/dL — ABNORMAL LOW (ref >50)
LDL Cholesterol: 100 mg/dL — ABNORMAL HIGH (ref <100)
Total CHOL/HDL Ratio: 3.8 Ratio (ref <5.0)
Triglycerides: 95 mg/dL (ref <150)
VLDL: 19 mg/dL (ref <30)

## 2017-02-17 LAB — VITAMIN D 25 HYDROXY (VIT D DEFICIENCY, FRACTURES): Vitamin D, 25-Hydroxy: 29.2 ng/mL — ABNORMAL LOW (ref 30.0–100.0)

## 2017-02-18 ENCOUNTER — Encounter: Payer: Self-pay | Admitting: Family Medicine

## 2017-02-18 LAB — HEMOGLOBIN A1C
HEMOGLOBIN A1C: 7.3 % — AB (ref <5.7)
MEAN PLASMA GLUCOSE: 163 mg/dL

## 2017-02-19 ENCOUNTER — Encounter: Payer: Self-pay | Admitting: Hematology

## 2017-02-21 ENCOUNTER — Other Ambulatory Visit: Payer: Self-pay | Admitting: Hematology

## 2017-02-21 ENCOUNTER — Other Ambulatory Visit: Payer: Self-pay | Admitting: Family Medicine

## 2017-02-21 DIAGNOSIS — Z170 Estrogen receptor positive status [ER+]: Principal | ICD-10-CM

## 2017-02-21 DIAGNOSIS — C50511 Malignant neoplasm of lower-outer quadrant of right female breast: Secondary | ICD-10-CM

## 2017-02-21 MED ORDER — METFORMIN HCL 1000 MG PO TABS: 1000.0000 mg | ORAL_TABLET | Freq: Two times a day (BID) | ORAL | 3 refills | Status: DC

## 2017-03-04 ENCOUNTER — Other Ambulatory Visit: Payer: Self-pay | Admitting: General Surgery

## 2017-03-04 DIAGNOSIS — N63 Unspecified lump in unspecified breast: Secondary | ICD-10-CM

## 2017-03-07 ENCOUNTER — Ambulatory Visit
Admission: RE | Admit: 2017-03-07 | Discharge: 2017-03-07 | Disposition: A | Discharge status: Home / Self Care | Payer: BC Managed Care – PPO | Source: Ambulatory Visit | Attending: General Surgery | Admitting: General Surgery

## 2017-03-07 ENCOUNTER — Other Ambulatory Visit: Payer: Self-pay | Admitting: General Surgery

## 2017-03-07 DIAGNOSIS — N63 Unspecified lump in unspecified breast: Secondary | ICD-10-CM

## 2017-03-07 DIAGNOSIS — R2231 Localized swelling, mass and lump, right upper limb: Secondary | ICD-10-CM

## 2017-03-10 ENCOUNTER — Ambulatory Visit
Admission: RE | Admit: 2017-03-10 | Discharge: 2017-03-10 | Disposition: A | Discharge status: Home / Self Care | Payer: BC Managed Care – PPO | Source: Ambulatory Visit | Attending: General Surgery | Admitting: General Surgery

## 2017-03-10 DIAGNOSIS — R2231 Localized swelling, mass and lump, right upper limb: Secondary | ICD-10-CM

## 2017-04-22 ENCOUNTER — Telehealth: Payer: Self-pay

## 2017-04-22 NOTE — Telephone Encounter (Signed)
Called and left a message with new appts dates and times due to pal day 06/17/17

## 2017-04-29 ENCOUNTER — Ambulatory Visit (HOSPITAL_BASED_OUTPATIENT_CLINIC_OR_DEPARTMENT_OTHER): Payer: BC Managed Care – PPO | Admitting: Adult Health

## 2017-04-29 VITALS — BP 161/72 | HR 80 | Temp 98.3°F | Resp 20 | Wt 181.0 lb

## 2017-04-29 DIAGNOSIS — C50511 Malignant neoplasm of lower-outer quadrant of right female breast: Secondary | ICD-10-CM | POA: Diagnosis not present

## 2017-04-29 DIAGNOSIS — Z79811 Long term (current) use of aromatase inhibitors: Secondary | ICD-10-CM | POA: Diagnosis not present

## 2017-04-29 DIAGNOSIS — Z170 Estrogen receptor positive status [ER+]: Secondary | ICD-10-CM | POA: Diagnosis not present

## 2017-04-29 NOTE — Progress Notes (Signed)
CLINIC:  Survivorship   REASON FOR VISIT:  Routine follow-up post-treatment for a recent history of breast cancer.  BRIEF ONCOLOGIC HISTORY:  Oncology History   Malignant neoplasm of lower-outer quadrant of right female breast [facility])   Staging form: Breast, AJCC 7th Edition   - Clinical stage from 06/23/2016: Stage IA (T1c, N0, M0) - Unsigned      Malignant neoplasm of lower-outer quadrant of right female breast ([place])   04/28/2015 Imaging    Bone Density Report 04/28/2015 AP Spine (L1-L4): T-Score = -1.7. Osteopenia Femoral Neck (Left) : T-Score = -1.0 Normal Femoral Neck (Right): T-Score = 0.4 Normal      03/01/2016 Mammogram    Bilateral screening mammogram was negative      06/08/2016 Imaging    Bilateral breast MRI with and without contrast showed 4 lesions in the right breast, 2 adjacent 1.4 cm mass in the inferior aspect of the right breast, and in the right lateral aspect of the breast, there are 8 mm and a 7 mm irregular enhancing mass. Multiple small enhancing lesions within the liver, indeterminate. No axillary adenopathy.      06/16/2016 Initial Diagnosis    Malignant neoplasm of lower-outer quadrant of right female breast ([place])      06/16/2016 Initial Biopsy    Right breast needle core biopsy at 9 clock, 6:30 clock, 6 clock showed invasive lobular carcinoma, and LCIS      06/16/2016 Receptors her2    two right breast biopsy showed ER strongly positive, PR strongly positive, HER-2 negative, Ki-67 5-10%       06/16/2016 Imaging    Right breast and axilla ultrasound showed a 1.4 cm mass at the 6:30 o'clock, 1.0 cm mass at the 6:00, and 63m mass at 9:00 position. Axilla was negative.      06/28/2016 Imaging    MRI ABDOMEN W W/O CONTRAST 06/28/2016 IMPRESSION: 1. Multiple hepatic hemangiomas.  No suspicious liver lesion. 2. Hepatic steatosis. 3. No evidence of abdominal metastasis.      06/28/2016 Genetic Testing    Patient has genetic testing done for  breast cancer. Results revealed patient has the following variant called, MSH2 [id number].      08/13/2016 Surgery    Bilateral simple mastectomy and right SLN biopsy        08/13/2016 Pathology Results    Right mastectomy showed invasive ductal carcinoma, G2, multifocal, 1.5cm, 1.5cm, and 1.0cm. (+) LCIS, margins are negative, 1 sentinel lymph node was negative. Left simple mastectomy showed LCIS, ADH, no malignancy.       08/13/2016 Oncotype testing    Oncotype recurrence score 11, which predicts 10 year distant recurrence risk of 7% with tamoxifen alone, low risk category. No adjuvant chemo recommended based on this.       09/25/2016 -  Anti-estrogen oral therapy    Adjuvant exemestane 2101mdaily       11/05/2016 Surgery    REMOVAL OF BILATERAL TISSUE EXPANDERS WITH PLACEMENT OF BILATERAL BREAST SALINE IMPLANTS by Dr. ThIran Planas      INTERVAL HISTORY:  [facility] to review her survivorship care plan detailing her treatment course for breast cancer, as well as monitoring long-term side effects of that treatment, education regarding health maintenance, screening, and overall wellness and health promotion.     Overall, Ms. MaMannereports feeling quite well    REVIEW OF SYSTEMS:  Review of Systems - Oncology Breast: Denies any new  nodularity, masses, tenderness, nipple changes, or nipple discharge.      ONCOLOGY TREATMENT TEAM:  1. Surgeon:  Dr. [name] at [facility] 2. Medical Oncologist: Dr. Burr Medico  3. Radiation Oncologist: Dr. Isidore Moos    PAST MEDICAL/SURGICAL HISTORY:  Past Medical History:  Diagnosis Date  . Anxiety   . Arthritis    OA hips  . Asthma    allergy and exercise induced asthma   . Depression   . Diabetes mellitus without complication ([place])   . Family history of breast cancer   . Family history of colon cancer   . Family history of stomach cancer   . GERD (gastroesophageal  reflux disease)   . Hypertension   . Malignant neoplasm of lower-outer quadrant of right female breast ([place]) 06/21/2016  . Seasonal allergies    Past Surgical History:  Procedure Laterality Date  . BREAST LUMPECTOMY     right   . BREAST RECONSTRUCTION WITH PLACEMENT OF TISSUE EXPANDER AND FLEX HD (ACELLULAR HYDRATED DERMIS) Bilateral 08/13/2016   Procedure: BREAST RECONSTRUCTION WITH PLACEMENT OF TISSUE EXPANDER AND ALLODERM;  Surgeon: Irene Limbo, MD;  Location: [place];  Service: Plastics;  Laterality: Bilateral;  . DILATION AND CURETTAGE OF UTERUS    . JOINT REPLACEMENT    . LIPOSUCTION WITH LIPOFILLING Bilateral 11/05/2016   Procedure: LIPOFILLING FROM ABDOMEN TO BILATERAL CHEST;  Surgeon: Irene Limbo, MD;  Location: [facility];  Service: Plastics;  Laterality: Bilateral;  . MASTECTOMY W/ SENTINEL NODE BIOPSY Bilateral 08/13/2016   Procedure: BILATERAL TOTAL MASTECTOMY WITH RIGHT AXILLARY SENTINEL LYMPH NODE BIOPSY;  Surgeon: [name], MD;  Location: [facility];  Service: General;  Laterality: Bilateral;  . REMOVAL OF BILATERAL TISSUE EXPANDERS WITH PLACEMENT OF BILATERAL BREAST IMPLANTS Bilateral 11/05/2016   Procedure: REMOVAL OF BILATERAL TISSUE EXPANDERS WITH PLACEMENT OF BILATERAL BREAST SALINE IMPLANTS;  Surgeon: Irene Limbo, MD;  Location: [facility];  Service: Plastics;  Laterality: Bilateral;  . TOTAL HIP ARTHROPLASTY Right 01/07/2016   Procedure: TOTAL HIP ARTHROPLASTY ANTERIOR APPROACH;  Surgeon: Gaynelle Arabian, MD;  [facility];  Service: Orthopedics;  Laterality: Right;     ALLERGIES:  Allergies  Allergen Reactions  . Azithromycin Itching and Rash  . Biaxin [Clarithromycin] Rash  . Codeine Rash    Thinks a codeine cough syrup caused rash.     CURRENT MEDICATIONS:  Outpatient Encounter Prescriptions as of 04/29/2017  Medication Sig  . Acetaminophen (TYLENOL ARTHRITIS PAIN PO) Take 2  tablets by mouth as needed.   Marland Kitchen albuterol (PROVENTIL HFA;VENTOLIN HFA) 108 (90 Base) MCG/ACT inhaler Inhale 2 puffs into the lungs every 6 (six) hours as needed for wheezing or shortness of breath.  . benazepril (LOTENSIN) 20 MG tablet TAKE (1) TABLET BY MOUTH TWICE DAILY.  Marland Kitchen escitalopram (LEXAPRO) 20 MG tablet Take 1 tablet (20 mg total) by mouth daily.  Marland Kitchen exemestane (AROMASIN) 25 MG tablet TAKE ONE TABLET BY MOUTH ONCE DAILY AFTER BREAKFAST.  . fexofenadine (ALLEGRA) 180 MG tablet Take 180 mg by mouth daily.  . metFORMIN (GLUCOPHAGE) 1000 MG tablet Take 1 tablet (1,000 mg total) by mouth 2 (two) times daily with a meal.  . omeprazole (PRILOSEC) 20 MG capsule TAKE (1) CAPSULE BY MOUTH ONCE DAILY.   No facility-administered encounter medications on file as of 04/29/2017.      ONCOLOGIC FAMILY HISTORY:  Family History  Problem Relation Age of Onset  . Arthritis Mother   . Asthma Mother   .  Diabetes Mother   . Hyperlipidemia Mother   . Hypertension Mother   . Arthritis Father   . Asthma Father   . Stroke Father   . Hyperlipidemia Father   . Early death Brother 8       Muscular Dystrophy  . Birth defects Brother   . Cancer Maternal Uncle        possibly stomach  . Mental retardation Maternal Uncle   . Cancer Paternal Grandmother 46       breast cancer   . Cancer Cousin 38       paternal cousin's daughter with breast cancer  . Colon cancer Maternal Aunt        dx in her 61s  . Leukemia Paternal Uncle   . Lymphoma Paternal Uncle   . Down syndrome Paternal Uncle      SOCIAL HISTORY:  MEGUMI TREASTER ismarried and lives with her husband in [place], [place].  She has 1 daughter and she lives in [place] also.  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  She denies any current or history of tobacco, alcohol, or illicit drug use.     PHYSICAL EXAMINATION:  Vital Signs:   Vitals:   04/29/17 0917  BP: (!) 161/72    Pulse: 80  Resp: 20  Temp: 98.3 F (36.8 C)  SpO2: 99%   Filed Weights   04/29/17 0917  Weight: 181 lb (82.1 kg)   General: Well-nourished, well-appearing female in no acute distress.  She is unaccompanied today.   HEENT: Head is normocephalic.  Pupils equal and reactive to light. Conjunctivae clear without exudate.  Sclerae anicteric. Oral mucosa is pink, moist.  Oropharynx is pink without lesions or erythema.  Lymph: No cervical, supraclavicular, or infraclavicular lymphadenopathy noted on palpation.  Cardiovascular: Regular rate and rhythm.Marland Kitchen Respiratory: Clear to auscultation bilaterally. Chest expansion symmetric; breathing non-labored.  GI: Abdomen soft and round; non-tender, non-distended. Bowel sounds normoactive.  GU: Deferred.  Neuro: No focal deficits. Steady gait.  Psych: Mood and affect normal and appropriate for situation.  Extremities: No edema. MSK: No focal spinal tenderness to palpation.  Full range of motion in bilateral upper extremities Skin: Warm and dry.  LABORATORY DATA:  None for this visit.  DIAGNOSTIC IMAGING:  None for this visit.      ASSESSMENT AND PLAN:  Ms.. Harwick is a pleasant 61 y.o. female with Stage IA right breast invasive lobular carcinoma, ER+/PR+/HER2-, diagnosed in 06/2016, treated with mastectomy,  and anti-estrogen therapy with Exemestane beginning in 09/2016.  She presents to the Survivorship Clinic for our initial meeting and routine follow-up post-completion of treatment for breast cancer.    1. Stage IA right breast cancer:  Ms. Hawn is continuing to recover from definitive treatment for breast cancer. She will follow-up with her medical oncologist, Dr. Burr Medico in 06/2017 with history and physical exam per surveillance protocol.  She will continue her anti-estrogen therapy with Exemestane. Thus far, she is tolerating the Exemestane well, with minimal side effects. She was instructed to make Dr. Burr Medico or myself aware if she begins to  experience any worsening side effects of the medication and I could see her back in clinic to help manage those side effects, as needed. Today, a comprehensive survivorship care plan and treatment summary was reviewed with the patient today detailing her breast cancer diagnosis, treatment course, potential late/long-term effects of treatment, appropriate follow-up care with recommendations for the future, and patient education resources.  A copy of this summary, along with a letter will be sent to the patient's primary care provider via mail/fax/In Basket message after today's visit.    2. Bone health:  Given Ms. Gilardi's age/history of breast cancer and her current treatment regimen including anti-estrogen therapy with Exemestane, she is at risk for bone demineralization.  Her last DEXA scan was 04/28/2015 and demonstrated a T score of -1.7 in her spine, consistent with osteopenia.  She was encouraged to increase her consumption of foods rich in calcium, as well as increase her weight-bearing activities.  She was given education on specific activities to promote bone health.  She will be due for repeat bone density this year.    3. Cancer screening:  Due to Ms. Hermance's history and her age, she should receive screening for skin cancers, colon cancer, and gynecologic cancers.  The information and recommendations are listed on the patient's comprehensive care plan/treatment summary and were reviewed in detail with the patient.    4. Health maintenance and wellness promotion: Ms. Laughery was encouraged to consume 5-7 servings of fruits and vegetables per day. We reviewed the "Nutrition Rainbow" handout, as well as the handout "Take Control of Your Health and Reduce Your Cancer Risk" from the [facility].  She was also encouraged to engage in moderate to vigorous exercise for 30 minutes per day most days of the week. We discussed the [employer] fitness program, which is designed for cancer  survivors to help them become more physically fit after cancer treatments.  She was instructed to limit her alcohol consumption and continue to abstain from tobacco use.     5. Support services/counseling: It is not uncommon for this period of the patient's cancer care trajectory to be one of many emotions and stressors.  We discussed an opportunity for her to participate in the next session of [facility] ("Finding Your New Normal") support group series designed for patients after they have completed treatment.   Ms. Curtner was encouraged to take advantage of our many other support services programs, support groups, and/or counseling in coping with her new life as a cancer survivor after completing anti-cancer treatment.  She was offered support today through active listening and expressive supportive counseling.  She was given information regarding our available services and encouraged to contact me with any questions or for help enrolling in any of our support group/programs.    Dispo:   -Return to cancer center 06/2017 for follow up with Dr. Burr Medico  -Follow up with Dr. Iran Planas on 07/18/2017 -Follow up with Dr. [name] in 08/2017 -Bone density due -She is welcome to return back to the Survivorship Clinic at any time; no additional follow-up needed at this time.  -Consider referral back to survivorship as a long-term survivor for continued surveillance  A total of (30) minutes of face-to-face time was spent with this patient with greater than 50% of that time in counseling and care-coordination.   [name], NP Survivorship Program [facility] [phone]   Note: PRIMARY CARE PROVIDER [name], [place] [phone]

## 2017-04-30 ENCOUNTER — Encounter: Payer: Self-pay | Admitting: Adult Health

## 2017-05-12 ENCOUNTER — Encounter: Payer: Self-pay | Admitting: Physician Assistant

## 2017-05-12 ENCOUNTER — Ambulatory Visit (INDEPENDENT_AMBULATORY_CARE_PROVIDER_SITE_OTHER): Payer: BC Managed Care – PPO | Admitting: Physician Assistant

## 2017-05-12 VITALS — BP 120/68 | HR 80 | Temp 98.5°F | Resp 18 | Ht 63.0 in | Wt 180.0 lb

## 2017-05-12 DIAGNOSIS — M25531 Pain in right wrist: Secondary | ICD-10-CM

## 2017-05-12 DIAGNOSIS — J988 Other specified respiratory disorders: Secondary | ICD-10-CM | POA: Diagnosis not present

## 2017-05-12 DIAGNOSIS — B9689 Other specified bacterial agents as the cause of diseases classified elsewhere: Principal | ICD-10-CM

## 2017-05-12 MED ORDER — CEFDINIR 300 MG PO CAPS: 300.0000 mg | ORAL_CAPSULE | Freq: Two times a day (BID) | ORAL | 0 refills | Status: DC

## 2017-05-12 NOTE — Progress Notes (Signed)
Patient ID: CERA RORKE MRN: [id number], DOB: 17-Jun-1956, 61 y.o. Date of Encounter: @DATE @  Chief Complaint:  Chief Complaint  Patient presents with  . Chest congestion, cough, right ear pain, hoarseness    HPI: 61 y.o. year old female  presents with above.   Patient reports that last week her son-in-law got sick with similar symptoms then her daughter and now patient has same symptoms. States that she is feeling a lot of congestion and mucus in her head and nose and chest. Says that she really isn't blowing much out of her nose and really isn't coughing much out either. Says "it is just stuck in there ". Says that she has a history of asthma and bronchitis in the past so she has started using some Mucinex DM  to make sure to clear things out of her lungs. Has had no fevers or chills. No sore throat. Had some mild hoarseness and also some discomfort and congestion in her right ear.   She also reports that she's been having some problems with her right wrist.  Says that a couple months ago her right wrist was feeling a little swollen and stiff.  Then one morning there was a popping sensation there and an area on the right wrist became swollen.  Since then that area has been having some discomfort. I asked if she has a lot of repetitive motion to the hand and wrist. 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  Today she is wearing a velcro strap wrapped around her right wrist. Says that she had used a wrist splint some but then switched to this because the other wrist splint was reducing her mobility. Says "something is going to have to be done"  Past Medical History:  Diagnosis Date  . Anxiety   . Arthritis    OA hips  . Asthma    allergy and exercise induced asthma   . Depression   . Diabetes mellitus without complication ([place])   . Family history of breast cancer   . Family history of colon cancer   . Family history of  stomach cancer   . GERD (gastroesophageal reflux disease)   . Hypertension   . Malignant neoplasm of lower-outer quadrant of right female breast ([place]) 06/21/2016  . Seasonal allergies      Home Meds: Outpatient Medications Prior to Visit  Medication Sig Dispense Refill  . Acetaminophen (TYLENOL ARTHRITIS PAIN PO) Take 2 tablets by mouth as needed.     Marland Kitchen albuterol (PROVENTIL HFA;VENTOLIN HFA) 108 (90 Base) MCG/ACT inhaler Inhale 2 puffs into the lungs every 6 (six) hours as needed for wheezing or shortness of breath.    . benazepril (LOTENSIN) 20 MG tablet TAKE (1) TABLET BY MOUTH TWICE DAILY. 60 tablet 11  . escitalopram (LEXAPRO) 20 MG tablet Take 1 tablet (20 mg total) by mouth daily. 30 tablet 11  . exemestane (AROMASIN) 25 MG tablet TAKE ONE TABLET BY MOUTH ONCE DAILY AFTER BREAKFAST. 30 tablet 1  . fexofenadine (ALLEGRA) 180 MG tablet Take 180 mg by mouth daily.    . metFORMIN (GLUCOPHAGE) 1000 MG tablet Take 1 tablet (1,000 mg total) by mouth 2 (two) times daily with a meal. 180 tablet 3  . omeprazole (PRILOSEC) 20 MG capsule TAKE (1) CAPSULE BY MOUTH ONCE DAILY. 30 capsule 0   No facility-administered medications prior to visit.     Allergies:  Allergies  Allergen Reactions  . Azithromycin Itching and Rash  . Biaxin [Clarithromycin] Rash  . Codeine Rash    Thinks a codeine cough syrup caused rash.    Social History   Social History  . Marital status: Married    Spouse name: N/A  . Number of children: 1  . Years of education: N/A   Occupational History  . Not on file.   Social History Main Topics  . Smoking status: Never Smoker  . Smokeless tobacco: Never Used  . Alcohol use No  . Drug use: No  . Sexual activity: Yes   Other Topics Concern  . Not on file   Social History Narrative  . No narrative on file    Family History  Problem Relation Age of Onset  . Arthritis Mother   . Asthma Mother   . Diabetes Mother   . Hyperlipidemia Mother   .  Hypertension Mother   . Arthritis Father   . Asthma Father   . Stroke Father   . Hyperlipidemia Father   . Early death Brother 8       Muscular Dystrophy  . Birth defects Brother   . Cancer Maternal Uncle        possibly stomach  . Mental retardation Maternal Uncle   . Cancer Paternal Grandmother 20       breast cancer   . Cancer Cousin 65       paternal cousin's daughter with breast cancer  . Colon cancer Maternal Aunt        dx in her 48s  . Leukemia Paternal Uncle   . Lymphoma Paternal Uncle   . Down syndrome Paternal Uncle      Review of Systems:  See HPI for pertinent ROS. All other ROS negative.    Physical Exam: Blood pressure 120/68, pulse 80, temperature 98.5 F (36.9 C), temperature source Oral, resp. rate 18, height 5\' 3"  (1.6 m), weight 180 lb (81.6 kg)., Body mass index is 31.89 kg/m. General: WNWD WF. Appears in no acute distress. Head: Normocephalic, atraumatic, eyes without discharge, sclera non-icteric, nares are without discharge. Bilateral auditory canals clear, TM's are without perforation, pearly grey and translucent with reflective cone of light bilaterally. Oral cavity moist, posterior pharynx without exudate, erythema, peritonsillar abscess.  Neck: Supple. No thyromegaly. No lymphadenopathy. Lungs: Clear bilaterally to auscultation without wheezes, rales, or rhonchi. Breathing is unlabored.No wheezes.  Heart: RRR with S1 S2. No murmurs, rubs, or gallops. Musculoskeletal:  Right Wrist: She points to dorsal aspect of wrist as area of discomfort. ROM is intact. Inspection is normal. Extremities/Skin: Warm and dry.  Neuro: Alert and oriented X 3. Moves all extremities spontaneously. Gait is normal. CNII-XII grossly in tact. Psych:  Responds to questions appropriately with a normal affect.     ASSESSMENT AND PLAN:  61 y.o. year old female with  1. Bacterial respiratory infection She has allergy to azithromycin and Biaxin. She is to use the [facility] as  directed. Discussed that her lungs sound clear on exam at this time. Do not feel that she needs prednisone now. Use albuterol inhaler if needed. Follow-up if symptoms worsen or do not resolve upon completion of Omnicef. - cefdinir (OMNICEF) 300 MG capsule; Take 1 capsule (300 mg total) by mouth 2 (two) times daily.  Dispense: 20 capsule; Refill: 0  2. Right wrist pain Suspect tendonitis. Told her to return to using wrist splint to reduce mobility of wrist. Discussed f/u with a Hand Specialist and she agrees. States  that she has gone to [facility] orth[facility] for other orthopedic problems and would like to go back there. However does need to see hand specialist. - Ambulatory referral to Orthopedic Surgery   Signed, Olean Ree [place], [place], [facility] 05/12/2017 8:43 AM

## 2017-05-17 ENCOUNTER — Other Ambulatory Visit: Payer: Self-pay | Admitting: Hematology

## 2017-05-17 DIAGNOSIS — C50511 Malignant neoplasm of lower-outer quadrant of right female breast: Secondary | ICD-10-CM

## 2017-05-17 DIAGNOSIS — Z170 Estrogen receptor positive status [ER+]: Principal | ICD-10-CM

## 2017-05-24 ENCOUNTER — Encounter: Payer: Self-pay | Admitting: Family Medicine

## 2017-05-26 ENCOUNTER — Other Ambulatory Visit: Payer: Self-pay | Admitting: Family Medicine

## 2017-05-26 NOTE — Telephone Encounter (Signed)
Medication refilled per protocol. 

## 2017-06-13 ENCOUNTER — Other Ambulatory Visit (HOSPITAL_BASED_OUTPATIENT_CLINIC_OR_DEPARTMENT_OTHER): Payer: BC Managed Care – PPO

## 2017-06-13 ENCOUNTER — Ambulatory Visit (HOSPITAL_BASED_OUTPATIENT_CLINIC_OR_DEPARTMENT_OTHER): Payer: BC Managed Care – PPO | Admitting: Hematology

## 2017-06-13 ENCOUNTER — Other Ambulatory Visit: Payer: Self-pay | Admitting: Physician Assistant

## 2017-06-13 ENCOUNTER — Telehealth: Payer: Self-pay | Admitting: Hematology

## 2017-06-13 ENCOUNTER — Other Ambulatory Visit: Payer: Self-pay | Admitting: Hematology

## 2017-06-13 VITALS — BP 133/70 | HR 74 | Temp 98.6°F | Resp 18 | Ht 63.0 in | Wt 180.7 lb

## 2017-06-13 DIAGNOSIS — C50511 Malignant neoplasm of lower-outer quadrant of right female breast: Secondary | ICD-10-CM

## 2017-06-13 DIAGNOSIS — E119 Type 2 diabetes mellitus without complications: Secondary | ICD-10-CM | POA: Diagnosis not present

## 2017-06-13 DIAGNOSIS — Z170 Estrogen receptor positive status [ER+]: Secondary | ICD-10-CM

## 2017-06-13 DIAGNOSIS — M8588 Other specified disorders of bone density and structure, other site: Secondary | ICD-10-CM

## 2017-06-13 DIAGNOSIS — K219 Gastro-esophageal reflux disease without esophagitis: Secondary | ICD-10-CM

## 2017-06-13 LAB — COMPREHENSIVE METABOLIC PANEL
ALBUMIN: 4.1 g/dL (ref 3.5–5.0)
ALT: 22 U/L (ref 0–55)
AST: 18 U/L (ref 5–34)
Alkaline Phosphatase: 103 U/L (ref 40–150)
Anion Gap: 9 mEq/L (ref 3–11)
BUN: 11.2 mg/dL (ref 7.0–26.0)
CHLORIDE: 104 meq/L (ref 98–109)
CO2: 28 mEq/L (ref 22–29)
Calcium: 9.7 mg/dL (ref 8.4–10.4)
Creatinine: 0.8 mg/dL (ref 0.6–1.1)
EGFR: 83 mL/min/{1.73_m2} — ABNORMAL LOW (ref >90)
GLUCOSE: 114 mg/dL (ref 70–140)
POTASSIUM: 3.9 meq/L (ref 3.5–5.1)
SODIUM: 140 meq/L (ref 136–145)
Total Bilirubin: 0.31 mg/dL (ref 0.20–1.20)
Total Protein: 7.2 g/dL (ref 6.4–8.3)

## 2017-06-13 LAB — CBC WITH DIFFERENTIAL/PLATELET
BASO%: 0.7 % (ref 0.0–2.0)
BASOS ABS: 0 10*3/uL (ref 0.0–0.1)
EOS%: 2.1 % (ref 0.0–7.0)
Eosinophils Absolute: 0.1 10*3/uL (ref 0.0–0.5)
HCT: 38.5 % (ref 34.8–46.6)
HEMOGLOBIN: 12.8 g/dL (ref 11.6–15.9)
LYMPH%: 33 % (ref 14.0–49.7)
MCH: 31.7 pg (ref 25.1–34.0)
MCHC: 33.3 g/dL (ref 31.5–36.0)
MCV: 95.2 fL (ref 79.5–101.0)
MONO#: 0.5 10*3/uL (ref 0.1–0.9)
MONO%: 9.5 % (ref 0.0–14.0)
NEUT#: 3 10*3/uL (ref 1.5–6.5)
NEUT%: 54.7 % (ref 38.4–76.8)
Platelets: 273 10*3/uL (ref 145–400)
RBC: 4.04 10*6/uL (ref 3.70–5.45)
RDW: 13.8 % (ref 11.2–14.5)
WBC: 5.4 10*3/uL (ref 3.9–10.3)
lymph#: 1.8 10*3/uL (ref 0.9–3.3)

## 2017-06-13 MED ORDER — EXEMESTANE 25 MG PO TABS: 25.0000 mg | ORAL_TABLET | Freq: Every day | ORAL | 5 refills | Status: DC

## 2017-06-13 NOTE — Telephone Encounter (Signed)
Scheduled appt per 10/8 los - Gave patient AVS and calender per los.  

## 2017-06-13 NOTE — Progress Notes (Signed)
Carolyn Glass  [phone] Fax:([phone]  Clinic Follow Up Note   Patient Care Team: [name], MD as PCP - General (Family Medicine) [name], MD as Consulting Physician (General Surgery) [name], MD as Consulting Physician (Hematology) [name], MD as Attending Physician (Radiation Oncology) [name], NP as Nurse Practitioner (Hematology and Oncology) 06/13/2017   CHIEF COMPLAINTS:  Follow up for right multifocal breast cancer  Oncology History   Malignant neoplasm of lower-outer quadrant of right female breast [facility])   Staging form: Breast, AJCC 7th Edition   - Clinical stage from 06/23/2016: Stage IA (T1c, N0, M0) - Unsigned      Malignant neoplasm of lower-outer quadrant of right female breast ([place])   04/28/2015 Imaging    Bone Density Report 04/28/2015 AP Spine (L1-L4): T-Score = -1.7. Osteopenia Femoral Neck (Left) : T-Score = -1.0 Normal Femoral Neck (Right): T-Score = 0.4 Normal      03/01/2016 Mammogram    Bilateral screening mammogram was negative      06/08/2016 Imaging    Bilateral breast MRI with and without contrast showed 4 lesions in the right breast, 2 adjacent 1.4 cm mass in the inferior aspect of the right breast, and in the right lateral aspect of the breast, there are 8 mm and a 7 mm irregular enhancing mass. Multiple small enhancing lesions within the liver, indeterminate. No axillary adenopathy.      06/16/2016 Initial Diagnosis    Malignant neoplasm of lower-outer quadrant of right female breast ([place])      06/16/2016 Initial Biopsy    Right breast needle core biopsy at 9 clock, 6:30 clock, 6 clock showed invasive lobular carcinoma, and LCIS      06/16/2016 Receptors her2    two right breast biopsy showed ER strongly positive, PR strongly positive, HER-2 negative, Ki-67 5-10%       06/16/2016 Imaging    Right breast and axilla ultrasound showed a 1.4 cm mass at the 6:30  o'clock, 1.0 cm mass at the 6:00, and 110m mass at 9:00 position. Axilla was negative.      06/28/2016 Imaging    MRI ABDOMEN W W/O CONTRAST 06/28/2016 IMPRESSION: 1. Multiple hepatic hemangiomas.  No suspicious liver lesion. 2. Hepatic steatosis. 3. No evidence of abdominal metastasis.      06/28/2016 Genetic Testing    Patient has genetic testing done for breast cancer. Results revealed patient has the following variant called, MSH2 c.2650A>T.      08/13/2016 Surgery    Bilateral simple mastectomy and right SLN biopsy        08/13/2016 Pathology Results    Right mastectomy showed invasive ductal carcinoma, G2, multifocal, 1.5cm, 1.5cm, and 1.0cm. (+) LCIS, margins are negative, 1 sentinel lymph node was negative. Left simple mastectomy showed LCIS, ADH, no malignancy.       08/13/2016 Oncotype testing    Oncotype recurrence score 11, which predicts 10 year distant recurrence risk of 7% with tamoxifen alone, low risk category. No adjuvant chemo recommended based on this.       09/25/2016 -  Anti-estrogen oral therapy    Adjuvant exemestane 230mdaily       11/05/2016 Surgery    REMOVAL OF BILATERAL TISSUE EXPANDERS WITH PLACEMENT OF BILATERAL BREAST SALINE IMPLANTS by Dr. ThIran Planas      HISTORY OF PRESENTING ILLNESS: (06/23/2016) [name]. female is here because of her newly diagnosed right breast cancer. She is accompanied by  her husband to our multidisciplinary clinic today.  She had right breast biopsy in [facility] which was negative for malignancy. She also had right breast lumpectomy in 2009 for benign lesion. Her last screening mammogram was negative in June 2017. Due to her family history of breast cancer, her primary care physician felt she is at high risk for breast cancer, and referred her for breast MRI on 06/08/2016, which showed 4 small lesions in the right breast. She underwent ultrasound of the right breast and axilla, which showed 2 adjacent breast  mass at 6:00 position, and a smaller mass at 9 clock position, axillary was negative. All of the 3 right breast lesions were biopsied under ultrasound guidance, which all showed invasive lobular carcinoma and LCIS. He appears dry positive, HER-2 negative.  She feels well, denies any significant pain, except arthritis pain in the left hip, manageable. She has good appetite and energy level, works full-time at [employer]. She is married, lives with her husband. Her paternal grandmother and paternal cousin had breast cancer in their 71s.  GYN HISTORY  Menarchal: 11  LMP: 61 yo  Contraceptive: no  HRT: no  G5P1: one daughter 87 yo   CURRENT THERAPY: Exemestane (Aromasin) 25 mg daily started on September 25, 2016  INTERVAL HISTORY:  [name] returns for follow up. She presents to the clinic today noting she is still on Exemestane. She has experienced joint pain in addition to her osteoarthritis in her hip and fingers. This joint pain improves later in the day. She has seen orthopedic for her wrist due to her joints. She takes tylenol to help her pain which help her some. Her hot flashes are tolerable. She is on Lexapro.  She notes feeling a lump on her right side and was seen by Dr. [name]. It was biopsied and it was scar tissue. The lump is no longer there. She will follow up with him in 08/2017 and Dr. Iran Planas in 07/2017.    MEDICAL HISTORY:  Past Medical History:  Diagnosis Date  . Anxiety   . Arthritis    OA hips  . Asthma    allergy and exercise induced asthma   . Depression   . Diabetes mellitus without complication ([place])   . Family history of breast cancer   . Family history of colon cancer   . Family history of stomach cancer   . GERD (gastroesophageal reflux disease)   . Hypertension   . Malignant neoplasm of lower-outer quadrant of right female breast ([place]) 06/21/2016  . Seasonal allergies     SURGICAL HISTORY: Past Surgical History:  Procedure Laterality  Date  . BREAST LUMPECTOMY     right   . BREAST RECONSTRUCTION WITH PLACEMENT OF TISSUE EXPANDER AND FLEX HD (ACELLULAR HYDRATED DERMIS) Bilateral 08/13/2016   Procedure: BREAST RECONSTRUCTION WITH PLACEMENT OF TISSUE EXPANDER AND ALLODERM;  Surgeon: Irene Limbo, MD;  Location: [place];  Service: Plastics;  Laterality: Bilateral;  . DILATION AND CURETTAGE OF UTERUS    . JOINT REPLACEMENT    . LIPOSUCTION WITH LIPOFILLING Bilateral 11/05/2016   Procedure: LIPOFILLING FROM ABDOMEN TO BILATERAL CHEST;  Surgeon: Irene Limbo, MD;  Location: [place];  Service: Plastics;  Laterality: Bilateral;  . MASTECTOMY W/ SENTINEL NODE BIOPSY Bilateral 08/13/2016   Procedure: BILATERAL TOTAL MASTECTOMY WITH RIGHT AXILLARY SENTINEL LYMPH NODE BIOPSY;  Surgeon: [name], MD;  Location: [place];  Service: General;  Laterality: Bilateral;  . REMOVAL OF BILATERAL  TISSUE EXPANDERS WITH PLACEMENT OF BILATERAL BREAST IMPLANTS Bilateral 11/05/2016   Procedure: REMOVAL OF BILATERAL TISSUE EXPANDERS WITH PLACEMENT OF BILATERAL BREAST SALINE IMPLANTS;  Surgeon: Irene Limbo, MD;  Location: [place];  Service: Plastics;  Laterality: Bilateral;  . TOTAL HIP ARTHROPLASTY Right 01/07/2016   Procedure: TOTAL HIP ARTHROPLASTY ANTERIOR APPROACH;  Surgeon: Gaynelle Arabian, MD;  Location: [facility];  Service: Orthopedics;  Laterality: Right;    SOCIAL HISTORY: Social History   Social History  . Marital status: Married    Spouse name: N/A  . Number of children: 1  . Years of education: N/A   Occupational History  . Not on file.   Social History Main Topics  . Smoking status: Never Smoker  . Smokeless tobacco: Never Used  . Alcohol use No  . Drug use: No  . Sexual activity: Yes   Other Topics Concern  . Not on file   Social History Narrative  . No narrative on file    FAMILY HISTORY: Family History  Problem Relation Age of Onset  .  Arthritis Mother   . Asthma Mother   . Diabetes Mother   . Hyperlipidemia Mother   . Hypertension Mother   . Arthritis Father   . Asthma Father   . Stroke Father   . Hyperlipidemia Father   . Early death Brother 8       Muscular Dystrophy  . Birth defects Brother   . Cancer Maternal Uncle        possibly stomach  . Mental retardation Maternal Uncle   . Cancer Paternal Grandmother 27       breast cancer   . Cancer Cousin 63       paternal cousin's daughter with breast cancer  . Colon cancer Maternal Aunt        dx in her 67s  . Leukemia Paternal Uncle   . Lymphoma Paternal Uncle   . Down syndrome Paternal Uncle     ALLERGIES:  is allergic to azithromycin; biaxin [clarithromycin]; and codeine.  MEDICATIONS:  Current Outpatient Prescriptions  Medication Sig Dispense Refill  . Acetaminophen (TYLENOL ARTHRITIS PAIN PO) Take 2 tablets by mouth as needed.     . benazepril (LOTENSIN) 20 MG tablet TAKE (1) TABLET BY MOUTH TWICE DAILY. 60 tablet 11  . cefdinir (OMNICEF) 300 MG capsule Take 1 capsule (300 mg total) by mouth 2 (two) times daily. 20 capsule 0  . escitalopram (LEXAPRO) 20 MG tablet TAKE ONE TABLET BY MOUTH ONCE DAILY. 90 tablet 0  . exemestane (AROMASIN) 25 MG tablet Take 1 tablet (25 mg total) by mouth daily after breakfast. 30 tablet 5  . fexofenadine (ALLEGRA) 180 MG tablet Take 180 mg by mouth daily.    . metFORMIN (GLUCOPHAGE) 1000 MG tablet Take 1 tablet (1,000 mg total) by mouth 2 (two) times daily with a meal. 180 tablet 3  . albuterol (PROVENTIL HFA;VENTOLIN HFA) 108 (90 Base) MCG/ACT inhaler Inhale 2 puffs into the lungs every 6 (six) hours as needed for wheezing or shortness of breath.    Marland Kitchen omeprazole (PRILOSEC) 20 MG capsule TAKE (1) CAPSULE BY MOUTH ONCE DAILY. 30 capsule 0   No current facility-administered medications for this visit.     REVIEW OF SYSTEMS:  Constitutional: Denies fevers, chills or abnormal night sweats (+) hot flashes  Eyes: Denies  blurriness of vision, double vision or watery eyes Ears, nose, mouth, throat, and face: Denies mucositis or sore throat Respiratory: Denies cough, dyspnea or wheezes  Cardiovascular: Denies palpitation, chest discomfort or lower extremity swelling Gastrointestinal:  Denies nausea, heartburn or change in bowel habits Skin: Denies abnormal skin rashes Lymphatics: Denies new lymphadenopathy or easy bruising Neurological:Denies numbness, tingling or new weaknesses MSK: (+) osteoporsis in right hip and joint, in additional to exemestane  Behavioral/Psych: Mood is stable, no new changes  All other systems were reviewed with the patient and are negative. Breast: (+) feels tightness around breast from reconstruction  PHYSICAL EXAMINATION: ECOG PERFORMANCE STATUS: 0 - Asymptomatic  Vitals:   06/13/17 1420  BP: 133/70  Pulse: 74  Resp: 18  Temp: 98.6 F (37 C)  SpO2: 97%   Filed Weights   06/13/17 1420  Weight: 180 lb 11.2 oz (82 kg)    GENERAL:alert, no distress and comfortable SKIN: skin color, texture, turgor are normal, no rashes or significant lesions EYES: normal, conjunctiva are pink and non-injected, sclera clear OROPHARYNX:no exudate, no erythema and lips, buccal mucosa, and tongue normal. NECK: supple, thyroid normal size, non-tender, without nodularity LYMPH:  no palpable lymphadenopathy in the cervical, axillary or inguinal LUNGS: clear to auscultation and percussion with normal breathing effort HEART: regular rate & rhythm and no murmurs and no lower extremity edema ABDOMEN:abdomen soft, non-tender and normal bowel sounds Musculoskeletal:no cyanosis of digits and no clubbing  PSYCH: alert & oriented x 3 with fluent speech NEURO: no focal motor/sensory deficits  Breasts: (+) Bilateral mastectomy with implants, symmetrical. Incisions healed well. No palpable mass.   LABORATORY DATA:  I have reviewed the data as listed CBC Latest Ref Rng & Units 06/13/2017 02/17/2017  02/16/2017  WBC 3.9 - 10.3 10e3/uL 5.4 4.6 5.7  Hemoglobin 11.6 - 15.9 g/dL 12.8 13.2 13.8  Hematocrit 34.8 - 46.6 % 38.5 40.9 42.5  Platelets 145 - 400 10e3/uL 273 346 349   CMP Latest Ref Rng & Units 06/13/2017 02/17/2017 02/16/2017  Glucose 70 - 140 mg/dl 114 114(H) 84  BUN 7.0 - 26.0 mg/dL 11.2 15 11.5  Creatinine 0.6 - 1.1 mg/dL 0.8 0.67 0.8  Sodium 136 - 145 mEq/L 140 138 141  Potassium 3.5 - 5.1 mEq/L 3.9 4.3 4.6  Chloride 98 - 110 mmol/L - 102 -  CO2 22 - 29 mEq/L 28 26 30(H)  Calcium 8.4 - 10.4 mg/dL 9.7 9.4 10.6(H)  Total Protein 6.4 - 8.3 g/dL 7.2 6.9 8.2  Total Bilirubin 0.20 - 1.20 mg/dL 0.31 0.4 0.50  Alkaline Phos 40 - 150 U/L 103 104 123  AST 5 - 34 U/L 18 16 17   ALT 0 - 55 U/L 22 22 26    PATHOLOGY REPORT  ONCOTYPE DX 08/13/2016   Diagnosis 08/13/2016 1. Breast, simple mastectomy, Left - LOBULAR NEOPLASIA (LOBULAR CARCINOMA IN SITU). - ATYPICAL DUCTAL HYPERPLASIA. - FLAT EPITHELIAL ATYPIA. - FIBROCYSTIC CHANGES WITH ADENOSIS AND CALCIFICATIONS. 2. Breast, simple mastectomy, Right - INVASIVE LOBULAR CARCINOMA, GRADE II/III, MULTIPLE FOCI, SPANNING 1.5 CM, 1.5 CM AND 1.0 CM. - LOBULAR CARCINOMA IN SITU. - THE SURGICAL RESECTION MARGINS ARE NEGATIVE FOR INVASIVE CARCINOMA. - SEE ONCOLOGY TABLE BELOW. 3. Lymph node, sentinel, biopsy, Right - THERE IS NO EVIDENCE OF CARCINOMA IN 1 OF 1 LYMPH NODE (0/1). - SEE COMMENT. Microscopic Comment 2. BREAST, INVASIVE TUMOR, WITH LYMPH NODES PRESENT Specimen, including laterality and lymph node sampling (sentinel, non-sentinel): Right breast and right axillary lymph node. Procedure: Simple mastectomy and axillary lymph node resection x1. Histologic type: Lobular Grade: II Tubule formation: 3 Nuclear pleomorphism: 2 Mitotic:2 Tumor size (gross measurements): 1.5 cm, 1.5 cm, and 1.0  cm Margins: Greater than 0.2 cm to all margins. Lymphovascular invasion: Not identified. Ductal carcinoma in situ: Not identified. Tumor  focality: At least three foci. Extent of tumor: Confined to breast parenchyma. Lymph nodes: 1 of 3 FINAL for YITTEL, EMRICH [id number]) Microscopic Comment(continued) Examined: 1 Sentinel 0 Non-sentinel 1 Total Lymph nodes with metastasis: 0 Breast prognostic profile: Estrogen receptor: Positive. Progesterone receptor: Positive. Her 2 neu: Negative. Ki-67: 5% - 10% Non-neoplastic breast: Fibrocystic changes with adenosis and calcifications. TNM: mpT1c, pN0 Comments: There are three nodules of similar appearing invasive lobular carcinoma with associated lobular carcinoma in situ. The carcinoma cells are negative for E-cadherin, supporting a lobular phenotype. 3. Immunohistochemical stains performed on two blocks of part 3 fail to highlight the presence of cytokeratin positive tumor cells. (JBK:ecj 08/17/2016) Enid Cutter MD Pathologist, Electronic Signature (Case signed 08/17/2016)  Genetic Testing 06/28/2016 Genetic testing did identify a variant called, MSH2 c.2650A>T.  Diagnosis 06/16/2016 1. Breast, right, needle core biopsy, 9 o'clock - INVASIVE LOBULAR CARCINOMA. - LOBULAR CARCINOMA IN SITU. - SEE COMMENT. 2. Breast, right, needle core biopsy, 6:30 o'clock - INVASIVE LOBULAR CARCINOMA. - LOBULAR CARCINOMA IN SITU. - SEE COMMENT. 3. Breast, right, needle core biopsy, 6 o'clock - INVASIVE LOBULAR CARCINOMA. - LOBULAR CARCINOMA IN SITU. - SEE COMMENT. Microscopic Comment 1. Smooth muscle myosin, p63 and calponin stains are performed on the first specimen (right 9 o'clock needle core biopsy). The stains confirm the presence of both invasive and in situ carcinoma. An E-cadherin immunohistochemical stain is also performed. This additional stain is negative in both the invasive and in situ components confirming a lobular phenotype. Although definitive grading of breast carcinoma is best done on excision, the features of the invasive tumor from the right 9 o'clock breast  biopsy are compatible with a grade 2 breast carcinoma. Breast prognostic markers will be performed and reported in an addendum. Findings are called to the [place] on 06/18/16. Dr. Vicente Males has seen this specimen in consultation with agreement. 2. Smooth muscle myosin, p63 and calponin stains are performed on the second specimen (right 6:30 o'clock needle core biopsy). The stains confirm the presence of both invasive and in situ carcinoma. An E-cadherin immunohistochemical stain is also performed. This additional stain is negative in both the invasive and in situ components confirming a lobular phenotype. Although definitive grading of breast carcinoma is best done on excision, the features of the invasive tumor from the right 6:30 o'clock biopsy are compatible with a grade 2 breast carcinoma. Breast prognostic markers will be performed and reported in an addendum. Findings are called to the [place] on 06/18/16. Dr. Vicente Males has seen this specimen in consultation with agreement. 3. An E-cadherin immunohistochemical stain is performed on the third specimen (right 6 o'clock breast biopsy). The stain fails to demonstrate positivity in the invasive or in situ component confirming the lobular nature of both. Although definitive grading of breast carcinoma is best done on excision, the features of the invasive tumor from the right 6 o'clock breast biopsy are compatible with a grade 2 breast carcinoma. Breast prognostic markers will be performed and reported in an addendum. Findings are called to the [facility] on 06/18/16. Dr. Vicente Males has seen this specimen in consultation with agreement. (RAH:gt, 06/18/16) Willeen Niece MD  1. Results: IMMUNOHISTOCHEMICAL AND MORPHOMETRIC ANALYSIS PERFORMED MANUALLY Estrogen Receptor: 95%, POSITIVE, STRONG STAINING INTENSITY Progesterone Receptor: 95%, POSITIVE, STRONG STAINING  INTENSITY Proliferation Marker Ki67: 10%  Results:  HER2 - NEGATIVE RATIO OF HER2/CEP17 SIGNALS 1.16 AVERAGE HER2 COPY NUMBER PER CELL 2.20  2. PROGNOSTIC INDICATORS Insufficient tissue   3. PROGNOSTIC INDICATORS Results: IMMUNOHISTOCHEMICAL AND MORPHOMETRIC ANALYSIS PERFORMED MANUALLY Estrogen Receptor: 100%, POSITIVE, STRONG STAINING INTENSITY Progesterone Receptor: 100%, POSITIVE, STRONG STAINING INTENSITY Proliferation Marker Ki67: 5% Results: HER2 - NEGATIVE RATIO OF HER2/CEP17 SIGNALS 1.19 AVERAGE HER2 COPY NUMBER PER CELL 3.15   RADIOGRAPHIC STUDIES: I have personally reviewed the radiological images as listed and agreed with the findings in the report. No results found.  ASSESSMENT & PLAN: 61 y.o. Caucasian postmenopausal woman, presented image discovered multifocal right breast masses.  1. Malignant neoplasm of lower-outer quadrant of right female breast, invasive lobular carcinoma and LCIS, mpT1cN0M0, stage IA, G2, ER+/PR+/HER2-, oncotype low risk  -I reviewed her surgical pathology findings with patient in details. Her surgical margins was negative, 1 sentinel lymph nodes was negative. She had stage I disease. -Oncotype DX score was 11 with the 10 year risk of distant recurrence with tamoxifen alone is 7%. This is at low risk. Therefore I do not recommend chemotherapy.  -she is on adjuvant exemestane. Plan for total 5 years. -She had a bilateral mastectomy, no routine mammogram needed -The patient underwent reconstruction with saline implants on 11/05/16 by Dr. Iran Planas. -continue healthy diet and exercise regularly. -We'll continue breast cancer surveillance with routine lab, exam and follow-up -Other than joint pain and hot flashes she is tolerating Exemestane well. I discussed that is this worsens we can change her to a different AI.  -She is clinically doing well, her breast exam was unremarkable, her CBC and CMP are within normal limits. There is no clinical  concern for recurrence.  -Due for bones scan through her PCP this year. -F/u in 6 months     2. Genetics -She has 2 family members who had breast cancer at a young age, she will qualify for genetic testing, we'll refer her. -Blood sample sent on 06/28/16. -Genetic testing did identify a VUS in MSH2, c.2650A>T.  3. DM, type 2 -She is on metformin now, her energy sugar was 282 today, she does not control her diet. -I previously advised the patient to check her glucose at home. -previously Advised low carb diet and exercise. -She'll follow-up with her primary care physician. -Sugar has improved. I suggested she continue to watch her diet and exercise to control her DM.   4. Arthritis and arthralgia -She had a total right hip replacement, has mild arthralgia in the left hip, manageable. -We previously discussed that adjuvant hormonal therapy could make this worse. -Her pain has been tolerable with tylenol so far. I suggest she tries to remain active with stationary bike and exercise.    5. Osteopenia  -Bone scan 04/28/15 revealed AP Spine (L1-L4) T-Score = -1.7. -We discussed that exemestane may weak bone -I previously advised the patient to take Calcium and vitamin D. Will check her Vit D level yearly  -previously Advised the patient to exercise to strengthen her bones. -Due to elevated calcium seen 02/16/17 I suggest she hold calcium and vitamin D to monitor her levels and drink plenty of water.  Calcium was 10.6 in 02/2017 and now resolved at 9.7 today (06/13/17). I advised her to drink plenty of water, put more diary into her diet and take vitamin D. She is fine to not to take anymore supplemental calcium.  -She is due for bone scan this year, she will set this up with her PCP.   6. Insomnia  -  I previously encouraged her to try melatonin or Benadryl for her insomnia -resolved now     PLAN -Continue Exemestane, refill today  -Lab and f/u in 6 months  -due for Bone Scan with her  PCP   No orders of the defined types were placed in this encounter.   All questions were answered. The patient knows to call the clinic with any problems, questions or concerns.     [name], MD 06/13/2017   This document serves as a record of services personally performed by [name], MD. It was created on her behalf by Joslyn Devon, a trained medical scribe. The creation of this record is based on the scribe's personal observations and the provider's statements to them. This document has been checked and approved by the attending provider.

## 2017-06-17 ENCOUNTER — Other Ambulatory Visit: Payer: BC Managed Care – PPO

## 2017-06-17 ENCOUNTER — Ambulatory Visit: Payer: BC Managed Care – PPO | Admitting: Hematology

## 2017-07-11 ENCOUNTER — Telehealth: Payer: Self-pay

## 2017-07-11 NOTE — Telephone Encounter (Signed)
Pt called- she got a letter in August to call for to set up a tcs.  She is not having any problems and she is not on any blood thinners. She would like to have it done in December if possible. She said she can be reached on her cell at [phone], if she doesn't answer, you can leave a message and she will call back, she is at work right now.

## 2017-07-12 NOTE — Telephone Encounter (Signed)
LMOM to call.

## 2017-07-13 NOTE — Telephone Encounter (Signed)
LMOM to call. Mailing a letter to call also.

## 2017-07-21 ENCOUNTER — Telehealth: Payer: Self-pay

## 2017-07-21 NOTE — Telephone Encounter (Signed)
[phone] PATIENT RECEIVED LETTER TO SCHEDULE TCS NO CURRENT GI ISSUES

## 2017-07-26 NOTE — Telephone Encounter (Signed)
LMOM for a return call.  

## 2017-08-03 ENCOUNTER — Telehealth: Payer: Self-pay

## 2017-08-03 NOTE — Telephone Encounter (Signed)
Patient was returning a call from DS. Trying to set up colonoscopy. Please call [phone]

## 2017-08-04 ENCOUNTER — Telehealth: Payer: Self-pay

## 2017-08-04 NOTE — Telephone Encounter (Signed)
See separate triage.  

## 2017-08-17 ENCOUNTER — Other Ambulatory Visit: Payer: Self-pay | Admitting: Family Medicine

## 2017-08-23 NOTE — Telephone Encounter (Addendum)
Gastroenterology Pre-Procedure Review  Request Date: 08/04/2017 Requesting Physician: Dr. Radene Knee  PATIENT REVIEW QUESTIONS: The patient responded to the following health history questions as indicated:    Pt's last colonoscopy was 03/14/2006 by Dr. Laural Golden  1. Diabetes Melitis:YES 2. Joint replacements in the past 12 months: no 3. Major health problems in the past 3 months: no 4. Has an artificial valve or MVP: no 5. Has a defibrillator: no 6. Has been advised in past to take antibiotics in advance of a procedure like teeth cleaning:YES   After hip replacement in May of 2017 7. Family history of colon cancer: Maternal mom's sister 8. Alcohol Use: NO 9. History of sleep apnea: no  10. History of coronary artery or other vascular stents placed within the last 12 months: no 11. History of any prior anesthesia complications: no    MEDICATIONS & ALLERGIES:    Patient reports the following regarding taking any blood thinners:   Plavix? no Aspirin? no Coumadin? no Brilinta? no Xarelto? no Eliquis? no Pradaxa? no Savaysa? no Effient? no  Patient confirms/reports the following medications:  Current Outpatient Medications  Medication Sig Dispense Refill  . Acetaminophen (TYLENOL ARTHRITIS PAIN PO) Take 2 tablets by mouth as needed.     Marland Kitchen albuterol (PROVENTIL HFA;VENTOLIN HFA) 108 (90 Base) MCG/ACT inhaler Inhale 2 puffs into the lungs every 6 (six) hours as needed for wheezing or shortness of breath.    . benazepril (LOTENSIN) 20 MG tablet TAKE (1) TABLET BY MOUTH TWICE DAILY. 60 tablet 11  . escitalopram (LEXAPRO) 20 MG tablet TAKE ONE TABLET BY MOUTH ONCE DAILY. 90 tablet 0  . exemestane (AROMASIN) 25 MG tablet Take 1 tablet (25 mg total) by mouth daily after breakfast. 30 tablet 5  . fexofenadine (ALLEGRA) 180 MG tablet Take 180 mg by mouth daily.    . metFORMIN (GLUCOPHAGE) 1000 MG tablet Take 1 tablet (1,000 mg total) by mouth 2 (two) times daily with a meal. 180 tablet 3  .  omeprazole (PRILOSEC) 20 MG capsule TAKE (1) CAPSULE BY MOUTH ONCE DAILY. 30 capsule 0   No current facility-administered medications for this visit.     Patient confirms/reports the following allergies:  Allergies  Allergen Reactions  . Azithromycin Itching and Rash  . Biaxin [Clarithromycin] Rash  . Codeine Rash    Thinks a codeine cough syrup caused rash.    No orders of the defined types were placed in this encounter.   AUTHORIZATION INFORMATION Primary Insurance:   ID #:   Group #:  Pre-Cert / Auth required: Pre-Cert / Auth #:   Secondary Insurance:   ID #:  Group #:  Pre-Cert / Auth required: Pre-Cert / Auth #:   SCHEDULE INFORMATION: Procedure has been scheduled as follows:  Date:  10/31/2017                  Time: 8:30 am  Location: [facility] Stay  This Gastroenterology Pre-Precedure Review Form is being routed to the following provider(s): Barney Drain, MD

## 2017-08-25 ENCOUNTER — Other Ambulatory Visit: Payer: Self-pay | Admitting: Physician Assistant

## 2017-08-25 DIAGNOSIS — K219 Gastro-esophageal reflux disease without esophagitis: Secondary | ICD-10-CM

## 2017-08-25 NOTE — Telephone Encounter (Signed)
Ok to schedule.  Day before TCS: metformin 500mg  bid. Am of tcs: hold metformin.

## 2017-08-29 ENCOUNTER — Other Ambulatory Visit: Payer: Self-pay

## 2017-08-29 DIAGNOSIS — Z1211 Encounter for screening for malignant neoplasm of colon: Secondary | ICD-10-CM

## 2017-08-29 MED ORDER — SOD PICOSULFATE-MAG OX-CIT ACD 10-3.5-12 MG-GM-GM PO PACK: 1.0000 | PACK | ORAL | 0 refills | Status: DC

## 2017-08-29 NOTE — Telephone Encounter (Signed)
Rx sent to the pharmacy and instructions mailed to pt.  

## 2017-09-23 ENCOUNTER — Other Ambulatory Visit: Payer: Self-pay | Admitting: Physician Assistant

## 2017-09-23 ENCOUNTER — Other Ambulatory Visit: Payer: Self-pay | Admitting: Family Medicine

## 2017-09-23 DIAGNOSIS — K219 Gastro-esophageal reflux disease without esophagitis: Secondary | ICD-10-CM

## 2017-10-24 ENCOUNTER — Other Ambulatory Visit: Payer: Self-pay | Admitting: Physician Assistant

## 2017-10-24 DIAGNOSIS — K219 Gastro-esophageal reflux disease without esophagitis: Secondary | ICD-10-CM

## 2017-10-31 ENCOUNTER — Ambulatory Visit (HOSPITAL_COMMUNITY)
Admission: RE | Admit: 2017-10-31 | Discharge: 2017-10-31 | Disposition: A | Discharge status: Home / Self Care | Payer: BC Managed Care – PPO | Source: Ambulatory Visit | Attending: Gastroenterology | Admitting: Gastroenterology

## 2017-10-31 ENCOUNTER — Encounter (HOSPITAL_COMMUNITY): Payer: Self-pay | Admitting: *Deleted

## 2017-10-31 ENCOUNTER — Encounter (HOSPITAL_COMMUNITY)
Admission: RE | Disposition: A | Discharge status: Home / Self Care | Payer: Self-pay | Source: Ambulatory Visit | Attending: Gastroenterology

## 2017-10-31 DIAGNOSIS — M160 Bilateral primary osteoarthritis of hip: Secondary | ICD-10-CM | POA: Diagnosis not present

## 2017-10-31 DIAGNOSIS — Z79899 Other long term (current) drug therapy: Secondary | ICD-10-CM | POA: Insufficient documentation

## 2017-10-31 DIAGNOSIS — J45909 Unspecified asthma, uncomplicated: Secondary | ICD-10-CM | POA: Diagnosis not present

## 2017-10-31 DIAGNOSIS — E119 Type 2 diabetes mellitus without complications: Secondary | ICD-10-CM | POA: Diagnosis not present

## 2017-10-31 DIAGNOSIS — K219 Gastro-esophageal reflux disease without esophagitis: Secondary | ICD-10-CM | POA: Insufficient documentation

## 2017-10-31 DIAGNOSIS — I10 Essential (primary) hypertension: Secondary | ICD-10-CM | POA: Diagnosis not present

## 2017-10-31 DIAGNOSIS — Z833 Family history of diabetes mellitus: Secondary | ICD-10-CM | POA: Diagnosis not present

## 2017-10-31 DIAGNOSIS — K648 Other hemorrhoids: Secondary | ICD-10-CM | POA: Insufficient documentation

## 2017-10-31 DIAGNOSIS — Z806 Family history of leukemia: Secondary | ICD-10-CM | POA: Insufficient documentation

## 2017-10-31 DIAGNOSIS — Z881 Allergy status to other antibiotic agents status: Secondary | ICD-10-CM | POA: Diagnosis not present

## 2017-10-31 DIAGNOSIS — Z803 Family history of malignant neoplasm of breast: Secondary | ICD-10-CM | POA: Diagnosis not present

## 2017-10-31 DIAGNOSIS — F329 Major depressive disorder, single episode, unspecified: Secondary | ICD-10-CM | POA: Diagnosis not present

## 2017-10-31 DIAGNOSIS — Z8261 Family history of arthritis: Secondary | ICD-10-CM | POA: Diagnosis not present

## 2017-10-31 DIAGNOSIS — Z9013 Acquired absence of bilateral breasts and nipples: Secondary | ICD-10-CM | POA: Diagnosis not present

## 2017-10-31 DIAGNOSIS — F419 Anxiety disorder, unspecified: Secondary | ICD-10-CM | POA: Insufficient documentation

## 2017-10-31 DIAGNOSIS — Z823 Family history of stroke: Secondary | ICD-10-CM | POA: Diagnosis not present

## 2017-10-31 DIAGNOSIS — Z1211 Encounter for screening for malignant neoplasm of colon: Secondary | ICD-10-CM | POA: Diagnosis not present

## 2017-10-31 DIAGNOSIS — Z800 Family history of malignant neoplasm of digestive organs: Secondary | ICD-10-CM | POA: Diagnosis not present

## 2017-10-31 DIAGNOSIS — D122 Benign neoplasm of ascending colon: Secondary | ICD-10-CM | POA: Diagnosis not present

## 2017-10-31 DIAGNOSIS — Z7984 Long term (current) use of oral hypoglycemic drugs: Secondary | ICD-10-CM | POA: Insufficient documentation

## 2017-10-31 DIAGNOSIS — K644 Residual hemorrhoidal skin tags: Secondary | ICD-10-CM | POA: Insufficient documentation

## 2017-10-31 DIAGNOSIS — Z853 Personal history of malignant neoplasm of breast: Secondary | ICD-10-CM | POA: Insufficient documentation

## 2017-10-31 DIAGNOSIS — K5730 Diverticulosis of large intestine without perforation or abscess without bleeding: Secondary | ICD-10-CM | POA: Diagnosis not present

## 2017-10-31 DIAGNOSIS — Z810 Family history of intellectual disabilities: Secondary | ICD-10-CM | POA: Insufficient documentation

## 2017-10-31 DIAGNOSIS — Z807 Family history of other malignant neoplasms of lymphoid, hematopoietic and related tissues: Secondary | ICD-10-CM | POA: Insufficient documentation

## 2017-10-31 DIAGNOSIS — Z8249 Family history of ischemic heart disease and other diseases of the circulatory system: Secondary | ICD-10-CM | POA: Insufficient documentation

## 2017-10-31 DIAGNOSIS — Z885 Allergy status to narcotic agent status: Secondary | ICD-10-CM | POA: Insufficient documentation

## 2017-10-31 DIAGNOSIS — Z8279 Family history of other congenital malformations, deformations and chromosomal abnormalities: Secondary | ICD-10-CM | POA: Insufficient documentation

## 2017-10-31 HISTORY — PX: COLONOSCOPY: SHX5424

## 2017-10-31 LAB — GLUCOSE, CAPILLARY: Glucose-Capillary: 154 mg/dL — ABNORMAL HIGH (ref 65–99)

## 2017-10-31 SURGERY — COLONOSCOPY
Anesthesia: Moderate Sedation

## 2017-10-31 MED ORDER — MIDAZOLAM HCL 5 MG/5ML IJ SOLN
INTRAMUSCULAR | Status: DC | PRN
  Administered 2017-10-31 (×2): 2 mg via INTRAVENOUS

## 2017-10-31 MED ORDER — PROMETHAZINE HCL 25 MG/ML IJ SOLN
INTRAMUSCULAR | Status: AC
  Filled 2017-10-31: qty 1

## 2017-10-31 MED ORDER — STERILE WATER FOR IRRIGATION IR SOLN
Status: DC | PRN
  Administered 2017-10-31: 100 mL

## 2017-10-31 MED ORDER — SODIUM CHLORIDE 0.9 % IV SOLN
INTRAVENOUS | Status: DC
  Administered 2017-10-31: 09:00:00 via INTRAVENOUS

## 2017-10-31 MED ORDER — MIDAZOLAM HCL 5 MG/5ML IJ SOLN
INTRAMUSCULAR | Status: AC
  Filled 2017-10-31: qty 10

## 2017-10-31 MED ORDER — PROMETHAZINE HCL 25 MG/ML IJ SOLN
INTRAMUSCULAR | Status: DC | PRN
  Administered 2017-10-31: 12.5 mg via INTRAVENOUS

## 2017-10-31 MED ORDER — MEPERIDINE HCL 100 MG/ML IJ SOLN
INTRAMUSCULAR | Status: DC
Start: 2017-10-31 — End: 2017-10-31
  Filled 2017-10-31: qty 2

## 2017-10-31 MED ORDER — MEPERIDINE HCL 100 MG/ML IJ SOLN
INTRAMUSCULAR | Status: DC | PRN
  Administered 2017-10-31: 25 mg via INTRAVENOUS
  Administered 2017-10-31: 50 mg via INTRAVENOUS

## 2017-10-31 NOTE — Discharge Instructions (Signed)
You have small internal hemorrhoids and diverticulosis IN YOUR LEFT AND RIGHT COLON. YOU HAD TWO POLYPS REMOVED.    DRINK WATER TO KEEP YOUR URINE LIGHT YELLOW.  CONTINUE YOUR WEIGHT LOSS EFFORTS. YOUR BODY MASS INDEX IS OVER 30 WHICH MEANS YOU ARE OBESE. OBESITY CAN ACTIVATE CANCER GENES. OBESITY IS ASSOCIATED WITH AN INCREASED FOR CIRRHOSIS AND ALL CANCERS, INCLUDING ESOPHAGEAL AND COLON CANCER. A WEIGHT OF 165 LBS  WILL GET YOUR BODY MASS INDEX(BMI) UNDER 30.  FOLLOW A HIGH FIBER DIET. AVOID ITEMS THAT CAUSE BLOATING. See info below.  YOUR BIOPSY RESULTS WILL BE AVAILABLE IN MY CHART AFTER Feb 28  AND MY OFFICE WILL CONTACT YOU IN 10-14 DAYS WITH YOUR RESULTS.   USE PREPARATION H FOUR TIMES  A DAY IF NEEDED TO RELIEVE RECTAL PAIN/PRESSURE/BLEEDING.  Next colonoscopy BETWEEN 2024-2026. Colonoscopy Care After Read the instructions outlined below and refer to this sheet in the next week. These discharge instructions provide you with general information on caring for yourself after you leave the hospital. While your treatment has been planned according to the most current medical practices available, unavoidable complications occasionally occur. If you have any problems or questions after discharge, call DR. FIELDS, [phone].  ACTIVITY  You may resume your regular activity, but move at a slower pace for the next 24 hours.   Take frequent rest periods for the next 24 hours.   Walking will help get rid of the air and reduce the bloated feeling in your belly (abdomen).   No driving for 24 hours (because of the medicine (anesthesia) used during the test).   You may shower.   Do not sign any important legal documents or operate any machinery for 24 hours (because of the anesthesia used during the test).    NUTRITION  Drink plenty of fluids.   You may resume your normal diet as instructed by your doctor.   Begin with a light meal and progress to your normal diet. Heavy or fried  foods are harder to digest and may make you feel sick to your stomach (nauseated).   Avoid alcoholic beverages for 24 hours or as instructed.    MEDICATIONS  You may resume your normal medications.   WHAT YOU CAN EXPECT TODAY  Some feelings of bloating in the abdomen.   Passage of more gas than usual.   Spotting of blood in your stool or on the toilet paper  .  IF YOU HAD POLYPS REMOVED DURING THE COLONOSCOPY:  Eat a soft diet IF YOU HAVE NAUSEA, BLOATING, ABDOMINAL PAIN, OR VOMITING.    FINDING OUT THE RESULTS OF YOUR TEST Not all test results are available during your visit. DR. Oneida Alar WILL CALL YOU WITHIN 14 DAYS OF YOUR PROCEDUE WITH YOUR RESULTS. Do not assume everything is normal if you have not heard from DR. FIELDS, CALL HER OFFICE AT [phone].  SEEK IMMEDIATE MEDICAL ATTENTION AND CALL THE OFFICE: [phone] IF:  You have more than a spotting of blood in your stool.   Your belly is swollen (abdominal distention).   You are nauseated or vomiting.   You have a temperature over 101F.   You have abdominal pain or discomfort that is severe or gets worse throughout the day.  High-Fiber Diet A high-fiber diet changes your normal diet to include more whole grains, legumes, fruits, and vegetables. Changes in the diet involve replacing refined carbohydrates with unrefined foods. The calorie level of the diet is essentially unchanged. The Dietary Reference Intake (recommended amount)  for adult males is 38 grams per day. For adult females, it is 25 grams per day. Pregnant and lactating women should consume 28 grams of fiber per day. Fiber is the intact part of a plant that is not broken down during digestion. Functional fiber is fiber that has been isolated from the plant to provide a beneficial effect in the body. PURPOSE  Increase stool bulk.   Ease and regulate bowel movements.   Lower cholesterol.   REDUCE RISK OF COLON CANCER  INDICATIONS THAT YOU NEED MORE  FIBER  Constipation and hemorrhoids.   Uncomplicated diverticulosis (intestine condition) and irritable bowel syndrome.   Weight management.   As a protective measure against hardening of the arteries (atherosclerosis), diabetes, and cancer.   GUIDELINES FOR INCREASING FIBER IN THE DIET  Start adding fiber to the diet slowly. A gradual increase of about 5 more grams (2 slices of whole-wheat bread, 2 servings of most fruits or vegetables, or 1 bowl of high-fiber cereal) per day is best. Too rapid an increase in fiber may result in constipation, flatulence, and bloating.   Drink enough water and fluids to keep your urine clear or pale yellow. Water, juice, or caffeine-free drinks are recommended. Not drinking enough fluid may cause constipation.   Eat a variety of high-fiber foods rather than one type of fiber.   Try to increase your intake of fiber through using high-fiber foods rather than fiber pills or supplements that contain small amounts of fiber.   The goal is to change the types of food eaten. Do not supplement your present diet with high-fiber foods, but replace foods in your present diet.   INCLUDE A VARIETY OF FIBER SOURCES  Replace refined and processed grains with whole grains, canned fruits with fresh fruits, and incorporate other fiber sources. White rice, white breads, and most bakery goods contain little or no fiber.   Brown whole-grain rice, buckwheat oats, and many fruits and vegetables are all good sources of fiber. These include: broccoli, Brussels sprouts, cabbage, cauliflower, beets, sweet potatoes, white potatoes (skin on), carrots, tomatoes, eggplant, squash, berries, fresh fruits, and dried fruits.   Cereals appear to be the richest source of fiber. Cereal fiber is found in whole grains and bran. Bran is the fiber-rich outer coat of cereal grain, which is largely removed in refining. In whole-grain cereals, the bran remains. In breakfast cereals, the largest  amount of fiber is found in those with "bran" in their names. The fiber content is sometimes indicated on the label.   You may need to include additional fruits and vegetables each day.   In baking, for 1 cup white flour, you may use the following substitutions:   1 cup whole-wheat flour minus 2 tablespoons.   1/2 cup white flour plus 1/2 cup whole-wheat flour.   Polyps, Colon  A polyp is extra tissue that grows inside your body. Colon polyps grow in the large intestine. The large intestine, also called the colon, is part of your digestive system. It is a long, hollow tube at the end of your digestive tract where your body makes and stores stool. Most polyps are not dangerous. They are benign. This means they are not cancerous. But over time, some types of polyps can turn into cancer. Polyps that are smaller than a pea are usually not harmful. But larger polyps could someday become or may already be cancerous. To be safe, doctors remove all polyps and test them.   PREVENTION There is not one  sure way to prevent polyps. You might be able to lower your risk of getting them if you:  Eat more fruits and vegetables and less fatty food.   Do not smoke.   Avoid alcohol.   Exercise every day.   Lose weight if you are overweight.   Eating more calcium and folate can also lower your risk of getting polyps. Some foods that are rich in calcium are milk, cheese, and broccoli. Some foods that are rich in folate are chickpeas, kidney beans, and spinach.    Diverticulosis Diverticulosis is a common condition that develops when small pouches (diverticula) form in the wall of the colon. The risk of diverticulosis increases with age. It happens more often in people who eat a low-fiber diet. Most individuals with diverticulosis have no symptoms. Those individuals with symptoms usually experience belly (abdominal) pain, constipation, or loose stools (diarrhea).  HOME CARE INSTRUCTIONS  Increase the  amount of fiber in your diet as directed by your caregiver or dietician. This may reduce symptoms of diverticulosis.   Drink at least 6 to 8 glasses of water each day to prevent constipation.   Try not to strain when you have a bowel movement.   Avoiding nuts and seeds to prevent complications is NOT NECESSARY.   FOODS HAVING HIGH FIBER CONTENT INCLUDE:  Fruits. Apple, peach, pear, tangerine, raisins, prunes.   Vegetables. Brussels sprouts, asparagus, broccoli, cabbage, carrot, cauliflower, romaine lettuce, spinach, summer squash, tomato, winter squash, zucchini.   Starchy Vegetables. Baked beans, kidney beans, lima beans, split peas, lentils, potatoes (with skin).   Grains. Whole wheat bread, brown rice, bran flake cereal, plain oatmeal, white rice, shredded wheat, bran muffins.   SEEK IMMEDIATE MEDICAL CARE IF:  You develop increasing pain or severe bloating.   You have an oral temperature above 101F.   You develop vomiting or bowel movements that are bloody or black.   Hemorrhoids Hemorrhoids are dilated (enlarged) veins around the rectum. Sometimes clots will form in the veins. This makes them swollen and painful. These are called thrombosed hemorrhoids. Causes of hemorrhoids include:  Constipation.   Straining to have a bowel movement.   HEAVY LIFTING   HOME CARE INSTRUCTIONS  Eat a well balanced diet and drink 6 to 8 glasses of water every day to avoid constipation. You may also use a bulk laxative.   Avoid straining to have bowel movements.   Keep anal area dry and clean.   Do not use a donut shaped pillow or sit on the toilet for long periods. This increases blood pooling and pain.   Move your bowels when your body has the urge; this will require less straining and will decrease pain and pressure.

## 2017-10-31 NOTE — H&P (Signed)
Primary Care Physician:  [name], MD Primary Gastroenterologist:  Dr. Oneida Alar  Pre-Procedure History & Physical: HPI:  Carolyn Glass is a 62 y.o. female here for [place].  Past Medical History:  Diagnosis Date  . Anxiety   . Arthritis    OA hips  . Asthma    allergy and exercise induced asthma   . Depression   . Diabetes mellitus without complication ([employer])   . Family history of breast cancer   . Family history of colon cancer   . Family history of stomach cancer   . GERD (gastroesophageal reflux disease)   . Hypertension   . Malignant neoplasm of lower-outer quadrant of right female breast ([place]) 06/21/2016  . Seasonal allergies     Past Surgical History:  Procedure Laterality Date  . BREAST LUMPECTOMY     right   . BREAST RECONSTRUCTION WITH PLACEMENT OF TISSUE EXPANDER AND FLEX HD (ACELLULAR HYDRATED DERMIS) Bilateral 08/13/2016   Procedure: BREAST RECONSTRUCTION WITH PLACEMENT OF TISSUE EXPANDER AND ALLODERM;  Surgeon: Irene Limbo, MD;  Location: [facility];  Service: Plastics;  Laterality: Bilateral;  . DILATION AND CURETTAGE OF UTERUS    . JOINT REPLACEMENT    . LIPOSUCTION WITH LIPOFILLING Bilateral 11/05/2016   Procedure: LIPOFILLING FROM ABDOMEN TO BILATERAL CHEST;  Surgeon: Irene Limbo, MD;  Location: [facility];  Service: Plastics;  Laterality: Bilateral;  . MASTECTOMY W/ SENTINEL NODE BIOPSY Bilateral 08/13/2016   Procedure: BILATERAL TOTAL MASTECTOMY WITH RIGHT AXILLARY SENTINEL LYMPH NODE BIOPSY;  Surgeon: [name], MD;  Location: [place];  Service: General;  Laterality: Bilateral;  . REMOVAL OF BILATERAL TISSUE EXPANDERS WITH PLACEMENT OF BILATERAL BREAST IMPLANTS Bilateral 11/05/2016   Procedure: REMOVAL OF BILATERAL TISSUE EXPANDERS WITH PLACEMENT OF BILATERAL BREAST SALINE IMPLANTS;  Surgeon: Irene Limbo, MD;  Location: [place];  Service: Plastics;   Laterality: Bilateral;  . TOTAL HIP ARTHROPLASTY Right 01/07/2016   Procedure: TOTAL HIP ARTHROPLASTY ANTERIOR APPROACH;  Surgeon: Gaynelle Arabian, MD;  Location: [facility];  Service: Orthopedics;  Laterality: Right;    Prior to Admission medications   Medication Sig Start Date End Date Taking? Authorizing Provider  acetaminophen (TYLENOL 8 HOUR ARTHRITIS PAIN) 650 MG CR tablet Take 1,300 mg by mouth every 8 (eight) hours as needed for pain.   Yes [provider]  benazepril (LOTENSIN) 20 MG tablet TAKE (1) TABLET BY MOUTH TWICE DAILY. 07/27/16  Yes [name], MD  escitalopram (LEXAPRO) 20 MG tablet TAKE ONE TABLET BY MOUTH ONCE DAILY. 09/23/17  Yes [name], MD  exemestane (AROMASIN) 25 MG tablet Take 1 tablet (25 mg total) by mouth daily after breakfast. 06/13/17  Yes [name], MD  fexofenadine (ALLEGRA) 180 MG tablet Take 180 mg by mouth daily.   Yes [provider]  metFORMIN (GLUCOPHAGE) 1000 MG tablet Take 1 tablet (1,000 mg total) by mouth 2 (two) times daily with a meal. 02/21/17  Yes Pickard, Cammie Mcgee, MD  omeprazole (PRILOSEC) 20 MG capsule TAKE (1) CAPSULE BY MOUTH ONCE DAILY. 10/24/17  Yes [name], MD  Sod Picosulfate-Mag Ox-Cit Acd 10-3.5-12 MG-GM-GM PACK Take 1 Container by mouth as directed. 08/29/17  Yes Mahala Menghini, PA-C  albuterol (PROVENTIL HFA;VENTOLIN HFA) 108 (90 Base) MCG/ACT inhaler Inhale 2 puffs into the lungs every 6 (six) hours as needed for wheezing or shortness of breath.    [provider]    Allergies as of 08/29/2017 -  Review Complete 08/04/2017  Allergen Reaction Noted  . Azithromycin Itching and Rash 12/23/2015  . Biaxin [clarithromycin] Rash 06/23/2016  . Codeine Rash 12/23/2015    Family History  Problem Relation Age of Onset  . Arthritis Mother   . Asthma Mother   . Diabetes Mother   . Hyperlipidemia Mother   . Hypertension Mother   . Arthritis Father   . Asthma Father   . Stroke Father   .  Hyperlipidemia Father   . Early death Brother 8       Muscular Dystrophy  . Birth defects Brother   . Cancer Maternal Uncle        possibly stomach  . Mental retardation Maternal Uncle   . Cancer Paternal Grandmother 43       breast cancer   . Cancer Cousin 62       paternal cousin's daughter with breast cancer  . Colon cancer Maternal Aunt        dx in her 55s  . Leukemia Paternal Uncle   . Lymphoma Paternal Uncle   . Down syndrome Paternal Uncle     Social History   Socioeconomic History  . Marital status: Married    Spouse name: Not on file  . Number of children: 1  . Years of education: Not on file  . Highest education level: Not on file  Social Needs  . Financial resource strain: Not on file  . Food insecurity - worry: Not on file  . Food insecurity - inability: Not on file  . Transportation needs - medical: Not on file  . Transportation needs - non-medical: Not on file  Occupational History  . Not on file  Tobacco Use  . Smoking status: Never Smoker  . Smokeless tobacco: Never Used  Substance and Sexual Activity  . Alcohol use: No  . Drug use: No  . Sexual activity: Yes  Other Topics Concern  . Not on file  Social History Narrative  . Not on file    Review of Systems: See HPI, otherwise negative ROS   Physical Exam: BP (!) 155/78   Pulse 89   Temp 98.9 F (37.2 C) (Oral)   Resp (!) 22   Ht 5' 2.5" (1.588 m)   Wt 180 lb (81.6 kg)   SpO2 94%   BMI 32.40 kg/m  General:   Alert,  pleasant and cooperative in NAD Head:  Normocephalic and atraumatic. Neck:  Supple; Lungs:  Clear throughout to auscultation.    Heart:  Regular rate and rhythm. Abdomen:  Soft, nontender and nondistended. Normal bowel sounds, without guarding, and without rebound.   Neurologic:  Alert and  oriented x4;  grossly normal neurologically.  Impression/Plan:     SCREENING  Plan:  1. TCS TODAY DISCUSSED PROCEDURE, BENEFITS, & RISKS: < 1% chance of medication reaction,  bleeding, perforation, or rupture of spleen/liver.

## 2017-10-31 NOTE — Op Note (Signed)
[facility] Patient Name: Carolyn Glass Procedure Date: 10/31/2017 7:21 AM MRN: [id number] Date of Birth: [birth year] Attending MD: Barney Drain MD, MD CSN: [id number] Age: 62 Admit Type: Outpatient Procedure:                Colonoscopy WITH COLD SNARE POLYPECTOMY Indications:              Screening for colorectal malignant neoplasm Providers:                Barney Drain MD, MD, Rosina Lowenstein, RN, Aram Candela Referring MD:             Cammie Mcgee. [place] Medicines:                Promethazine 12.5 mg IV, Meperidine 75 mg IV,                            Midazolam 4 mg IV Complications:            No immediate complications. Estimated Blood Loss:     Estimated blood loss was minimal. Procedure:                Pre-Anesthesia Assessment:                           - Prior to the procedure, a History and Physical                            was performed, and patient medications and                            allergies were reviewed. The patient's tolerance of                            previous anesthesia was also reviewed. The risks                            and benefits of the procedure and the sedation                            options and risks were discussed with the patient.                            All questions were answered, and informed consent                            was obtained. Prior Anticoagulants: The patient has                            taken no previous anticoagulant or antiplatelet                            agents. ASA Grade Assessment: II - A patient with                            mild systemic disease. After reviewing the risks  and benefits, the patient was deemed in                            satisfactory condition to undergo the procedure.                            After obtaining informed consent, the colonoscope                            was passed under direct vision. Throughout the                            procedure, the  patient's blood pressure, pulse, and                            oxygen saturations were monitored continuously. The                            EC-[id number] ([id number]) scope was introduced through                            the anus and advanced to the the cecum, identified                            by appendiceal orifice and ileocecal valve. The                            colonoscopy was somewhat difficult due to a                            tortuous colon. Successful completion of the                            procedure was aided by COLOWRAP. The ileocecal                            valve, appendiceal orifice, and rectum were                            photographed. The patient tolerated the procedure                            well. The quality of the bowel preparation was good. Scope In: 8:51:41 AM Scope Out: 9:09:41 AM Scope Withdrawal Time: 0 hours 15 minutes 55 seconds  Total Procedure Duration: 0 hours 18 minutes 0 seconds  Findings:      Two sessile polyps were found in the proximal ascending colon. The       polyps were 2 to 3 mm in size. These polyps were removed with a cold       snare. Resection and retrieval were complete.      Multiple small and large-mouthed diverticula were found in the       recto-sigmoid colon, sigmoid colon, hepatic flexure and ascending colon.      External and internal hemorrhoids were found during  retroflexion. The       hemorrhoids were small. Impression:               - Two 2 to 3 mm polyps in the proximal ascending                            colon, removed with a cold snare. Resected and                            retrieved.                           - Diverticulosis in the recto-sigmoid colon, in the                            sigmoid colon, at the hepatic flexure and in the                            ascending colon.                           - External and internal hemorrhoids. Moderate Sedation:      Moderate (conscious) sedation was  administered by the endoscopy nurse       and supervised by the endoscopist. The following parameters were       monitored: oxygen saturation, heart rate, blood pressure, and response       to care. Total physician intraservice time was 27 minutes. Recommendation:           - Repeat colonoscopy in 5-10 years for surveillance.                           - High fiber diet.                           - Continue present medications.                           - Await pathology results.                           - Patient has a contact number available for                            emergencies. The signs and symptoms of potential                            delayed complications were discussed with the                            patient. Return to normal activities tomorrow.                            Written discharge instructions were provided to the                            patient. Procedure Code(s):        ---  Professional ---                           [phone], Colonoscopy, flexible; with removal of                            tumor(s), polyp(s), or other lesion(s) by snare                            technique                           99152, Moderate sedation services provided by the                            same physician or other qualified health care                            professional performing the diagnostic or                            therapeutic service that the sedation supports,                            requiring the presence of an independent trained                            observer to assist in the monitoring of the                            patient's level of consciousness and physiological                            status; initial 15 minutes of intraservice time,                            patient age 19 years or older                           [phone], Moderate sedation services; each additional                            15 minutes intraservice time Diagnosis  Code(s):        --- Professional ---                           Z12.11, Encounter for screening for malignant                            neoplasm of colon                           D12.2, Benign neoplasm of ascending colon                           K64.8, Other hemorrhoids  K57.30, Diverticulosis of large intestine without                            perforation or abscess without bleeding CPT copyright 2016 [employer]. All rights reserved. The codes documented in this report are preliminary and upon coder review may  be revised to meet current compliance requirements. Barney Drain, MD Barney Drain MD, MD 10/31/2017 9:18:45 AM This report has been signed electronically. Number of Addenda: 0

## 2017-11-01 NOTE — Progress Notes (Signed)
LMOM to call.

## 2017-11-02 ENCOUNTER — Encounter (HOSPITAL_COMMUNITY): Payer: Self-pay | Admitting: Gastroenterology

## 2017-11-02 ENCOUNTER — Telehealth: Payer: Self-pay

## 2017-11-02 NOTE — Telephone Encounter (Signed)
PT called and was informed of her path results.

## 2017-11-02 NOTE — Progress Notes (Signed)
LMOM to call and mailing a letter to call also.

## 2017-11-03 ENCOUNTER — Ambulatory Visit: Payer: BC Managed Care – PPO | Admitting: Family Medicine

## 2017-11-03 ENCOUNTER — Encounter: Payer: Self-pay | Admitting: Family Medicine

## 2017-11-03 VITALS — BP 144/80 | HR 88 | Temp 98.5°F | Resp 14 | Ht 63.0 in | Wt 175.0 lb

## 2017-11-03 DIAGNOSIS — R05 Cough: Secondary | ICD-10-CM | POA: Diagnosis not present

## 2017-11-03 DIAGNOSIS — R059 Cough, unspecified: Secondary | ICD-10-CM

## 2017-11-03 MED ORDER — BENZONATATE 100 MG PO CAPS: 200.0000 mg | ORAL_CAPSULE | Freq: Three times a day (TID) | ORAL | 0 refills | Status: DC | PRN

## 2017-11-03 NOTE — Progress Notes (Signed)
Subjective:    Patient ID: Carolyn Glass, female    DOB: 03-20-1956, 62 y.o.   MRN: [id number]  HPI Patient has had a cough for 3 weeks.  It is a dry itchy cough.  She reports postnasal drip.  Cough is nonproductive.  She denies any wheezing chest pain fevers chills or shortness of breath.  All of her coworkers have had a upper respiratory infection.  She is also on benazepril Past Medical History:  Diagnosis Date  . Anxiety   . Arthritis    OA hips  . Asthma    allergy and exercise induced asthma   . Depression   . Diabetes mellitus without complication ([place])   . Family history of breast cancer   . Family history of colon cancer   . Family history of stomach cancer   . GERD (gastroesophageal reflux disease)   . Hypertension   . Malignant neoplasm of lower-outer quadrant of right female breast ([place]) 06/21/2016  . Seasonal allergies    Past Surgical History:  Procedure Laterality Date  . BREAST LUMPECTOMY     right   . BREAST RECONSTRUCTION WITH PLACEMENT OF TISSUE EXPANDER AND FLEX HD (ACELLULAR HYDRATED DERMIS) Bilateral 08/13/2016   Procedure: BREAST RECONSTRUCTION WITH PLACEMENT OF TISSUE EXPANDER AND ALLODERM;  Surgeon: Carolyn Limbo, MD;  Location: [place];  Service: Plastics;  Laterality: Bilateral;  . COLONOSCOPY N/A 10/31/2017   Procedure: COLONOSCOPY;  Surgeon: Carolyn Binder, MD;  [facility];  Service: Endoscopy;  Laterality: N/A;  8:30 Am  . DILATION AND CURETTAGE OF UTERUS    . JOINT REPLACEMENT    . LIPOSUCTION WITH LIPOFILLING Bilateral 11/05/2016   Procedure: LIPOFILLING FROM ABDOMEN TO BILATERAL CHEST;  Surgeon: Carolyn Limbo, MD;  Location: [place];  Service: Plastics;  Laterality: Bilateral;  . MASTECTOMY W/ SENTINEL NODE BIOPSY Bilateral 08/13/2016   Procedure: BILATERAL TOTAL MASTECTOMY WITH RIGHT AXILLARY SENTINEL LYMPH NODE BIOPSY;  Surgeon: Carolyn Seltzer, MD;  Location: [place];   Service: General;  Laterality: Bilateral;  . REMOVAL OF BILATERAL TISSUE EXPANDERS WITH PLACEMENT OF BILATERAL BREAST IMPLANTS Bilateral 11/05/2016   Procedure: REMOVAL OF BILATERAL TISSUE EXPANDERS WITH PLACEMENT OF BILATERAL BREAST SALINE IMPLANTS;  Surgeon: Carolyn Limbo, MD;  Location: [place];  Service: Plastics;  Laterality: Bilateral;  . TOTAL HIP ARTHROPLASTY Right 01/07/2016   Procedure: TOTAL HIP ARTHROPLASTY ANTERIOR APPROACH;  Surgeon: Carolyn Arabian, MD;  Location: [facility];  Service: Orthopedics;  Laterality: Right;   Current Outpatient Medications on File Prior to Visit  Medication Sig Dispense Refill  . acetaminophen (TYLENOL 8 HOUR ARTHRITIS PAIN) 650 MG CR tablet Take 1,300 mg by mouth every 8 (eight) hours as needed for pain.    Marland Kitchen albuterol (PROVENTIL HFA;VENTOLIN HFA) 108 (90 Base) MCG/ACT inhaler Inhale 2 puffs into the lungs every 6 (six) hours as needed for wheezing or shortness of breath.    . benazepril (LOTENSIN) 20 MG tablet TAKE (1) TABLET BY MOUTH TWICE DAILY. 60 tablet 11  . escitalopram (LEXAPRO) 20 MG tablet TAKE ONE TABLET BY MOUTH ONCE DAILY. 90 tablet 0  . exemestane (AROMASIN) 25 MG tablet Take 1 tablet (25 mg total) by mouth daily after breakfast. 30 tablet 5  . fexofenadine (ALLEGRA) 180 MG tablet Take 180 mg by mouth daily.    . metFORMIN (GLUCOPHAGE) 1000 MG tablet Take 1 tablet (1,000 mg total) by mouth 2 (two) times daily with a meal. 180 tablet 3  .  omeprazole (PRILOSEC) 20 MG capsule TAKE (1) CAPSULE BY MOUTH ONCE DAILY. 30 capsule 0   No current facility-administered medications on file prior to visit.    Allergies  Allergen Reactions  . Azithromycin Itching and Rash  . Biaxin [Clarithromycin] Rash  . Codeine Rash    Thinks a codeine cough syrup caused rash.   Social History   Socioeconomic History  . Marital status: Married    Spouse name: Not on file  . Number of children: 1  . Years of education: Not on file  . Highest  education level: Not on file  Social Needs  . Financial resource strain: Not on file  . Food insecurity - worry: Not on file  . Food insecurity - inability: Not on file  . Transportation needs - medical: Not on file  . Transportation needs - non-medical: Not on file  Occupational History  . Not on file  Tobacco Use  . Smoking status: Never Smoker  . Smokeless tobacco: Never Used  Substance and Sexual Activity  . Alcohol use: No  . Drug use: No  . Sexual activity: Yes  Other Topics Concern  . Not on file  Social History Narrative  . Not on file      Review of Systems  All other systems reviewed and are negative.      Objective:   Physical Exam  Constitutional: She appears well-developed and well-nourished.  HENT:  Right Ear: External ear normal.  Left Ear: External ear normal.  Nose: Nose normal.  Mouth/Throat: Oropharynx is clear and moist. No oropharyngeal exudate.  Neck: Neck supple.  Cardiovascular: Normal rate, regular rhythm and normal heart sounds.  No murmur heard. Pulmonary/Chest: Effort normal and breath sounds normal. No respiratory distress. She has no wheezes. She has no rales.  Abdominal: Soft. Bowel sounds are normal.  Vitals reviewed.         Assessment & Plan:  Cough  Exam is completely benign.  I suspect this is a cough due to an ACE inhibitor versus a cough due to an upper respiratory infection.  I have recommended xyzal 5 mg a day to eliminate postnasal drip and sinus drainage given her history of hypertension.  I recommended over-the-counter Mucinex or Robitussin.  She can use Tessalon Perles 200 mg every 8 hours as.  If symptoms are no better in 1 week and no other symptoms develop, I would recommend a trial off her ACE inhibitor to see if the cough improves

## 2017-11-23 ENCOUNTER — Other Ambulatory Visit: Payer: Self-pay | Admitting: Family Medicine

## 2017-11-23 DIAGNOSIS — K219 Gastro-esophageal reflux disease without esophagitis: Secondary | ICD-10-CM

## 2017-12-09 NOTE — Progress Notes (Signed)
[place]  Telephone:([phone] [phone]  Clinic Follow Up Note   Patient Care Team: [name], MD as PCP - General (Family Medicine) [name], MD as Consulting Physician (General Surgery) [name], MD as Consulting Physician (Hematology) [name], MD as Attending Physician (Radiation Oncology) [name], NP as Nurse Practitioner (Hematology and Oncology) 12/12/2017   CHIEF COMPLAINTS:  Follow up for right multifocal breast cancer  Oncology History   Malignant neoplasm of lower-outer quadrant of right female breast [facility])   Staging form: Breast, AJCC 7th Edition   - Clinical stage from 06/23/2016: Stage IA (T1c, N0, M0) - Unsigned      Malignant neoplasm of lower-outer quadrant of right female breast ([place])   04/28/2015 Imaging    Bone Density Report 04/28/2015 AP Spine (L1-L4): T-Score = -1.7. Osteopenia Femoral Neck (Left) : T-Score = -1.0 Normal Femoral Neck (Right): T-Score = 0.4 Normal      03/01/2016 Mammogram    Bilateral screening mammogram was negative      06/08/2016 Imaging    Bilateral breast MRI with and without contrast showed 4 lesions in the right breast, 2 adjacent 1.4 cm mass in the inferior aspect of the right breast, and in the right lateral aspect of the breast, there are 8 mm and a 7 mm irregular enhancing mass. Multiple small enhancing lesions within the liver, indeterminate. No axillary adenopathy.      06/16/2016 Initial Diagnosis    Malignant neoplasm of lower-outer quadrant of right female breast ([place])      06/16/2016 Initial Biopsy    Right breast needle core biopsy at 9 clock, 6:30 clock, 6 clock showed invasive lobular carcinoma, and LCIS      06/16/2016 Receptors her2    two right breast biopsy showed ER strongly positive, PR strongly positive, HER-2 negative, Ki-67 5-10%       06/16/2016 Imaging    Right breast and axilla ultrasound showed a 1.4 cm mass at the 6:30  o'clock, 1.0 cm mass at the 6:00, and 19m mass at 9:00 position. Axilla was negative.      06/28/2016 Imaging    MRI ABDOMEN W W/O CONTRAST 06/28/2016 IMPRESSION: 1. Multiple hepatic hemangiomas.  No suspicious liver lesion. 2. Hepatic steatosis. 3. No evidence of abdominal metastasis.      06/28/2016 Genetic Testing    Patient has genetic testing done for breast cancer. Results revealed patient has the following variant called, MSH2 c.2650A>T.      08/13/2016 Surgery    Bilateral simple mastectomy and right SLN biopsy        08/13/2016 Pathology Results    Right mastectomy showed invasive ductal carcinoma, G2, multifocal, 1.5cm, 1.5cm, and 1.0cm. (+) LCIS, margins are negative, 1 sentinel lymph node was negative. Left simple mastectomy showed LCIS, ADH, no malignancy.       08/13/2016 Oncotype testing    Oncotype recurrence score 11, which predicts 10 year distant recurrence risk of 7% with tamoxifen alone, low risk category. No adjuvant chemo recommended based on this.       09/25/2016 -  Anti-estrogen oral therapy    Adjuvant exemestane '25mg'$  daily       11/05/2016 Surgery    REMOVAL OF BILATERAL TISSUE EXPANDERS WITH PLACEMENT OF BILATERAL BREAST SALINE IMPLANTS by Dr. TIran Planas       HISTORY OF PRESENTING ILLNESS: (06/23/2016) PForbes Cellar62y.o. female is here because of her newly diagnosed right breast cancer. She is accompanied by  her husband to our multidisciplinary clinic today.  She had right breast biopsy in [facility] which was negative for malignancy. She also had right breast lumpectomy in 2009 for benign lesion. Her last screening mammogram was negative in June 2017. Due to her family history of breast cancer, her primary care physician felt she is at high risk for breast cancer, and referred her for breast MRI on 06/08/2016, which showed 4 small lesions in the right breast. She underwent ultrasound of the right breast and axilla, which showed 2 adjacent breast  mass at 6:00 position, and a smaller mass at 9 clock position, axillary was negative. All of the 3 right breast lesions were biopsied under ultrasound guidance, which all showed invasive lobular carcinoma and LCIS. He appears dry positive, HER-2 negative.  She feels well, denies any significant pain, except arthritis pain in the left hip, manageable. She has good appetite and energy level, works full-time at [employer]. She is married, lives with her husband. Her paternal grandmother and paternal cousin had breast cancer in their 27s.  GYN HISTORY  Menarchal: 11  LMP: 62 yo  Contraceptive: no  HRT: no  G5P1: one daughter 56 yo   CURRENT THERAPY: Exemestane (Aromasin) 25 mg daily started on September 25, 2016  INTERVAL HISTORY:  Carolyn Glass returns for follow up. She presents to the clinic today by herself. She reports she is doing okay overall. She does have some arthritis in her right wrist nagging her. She notes her pain is worse when she is more active with her hands. She is being followed by ortho. She is compliant with Exemestane. She states she has pain and stiffness in here fingers in the morning. She reports mood swings and hot flashes. She does take lexipro and she started 10 years ago.   On review of systems, pt denies any other complaints at this time. Pertinent positives are listed and detailed within the above HPI.   MEDICAL HISTORY:  Past Medical History:  Diagnosis Date  . Anxiety   . Arthritis    OA hips  . Asthma    allergy and exercise induced asthma   . Depression   . Diabetes mellitus without complication ([place])   . Family history of breast cancer   . Family history of colon cancer   . Family history of stomach cancer   . GERD (gastroesophageal reflux disease)   . Hypertension   . Malignant neoplasm of lower-outer quadrant of right female breast ([place]) 06/21/2016  . Seasonal allergies     SURGICAL HISTORY: Past Surgical History:  Procedure  Laterality Date  . BREAST LUMPECTOMY     right   . BREAST RECONSTRUCTION WITH PLACEMENT OF TISSUE EXPANDER AND FLEX HD (ACELLULAR HYDRATED DERMIS) Bilateral 08/13/2016   Procedure: BREAST RECONSTRUCTION WITH PLACEMENT OF TISSUE EXPANDER AND ALLODERM;  Surgeon: Irene Limbo, MD;  Location: [place];  Service: Plastics;  Laterality: Bilateral;  . COLONOSCOPY N/A 10/31/2017   Procedure: COLONOSCOPY;  Surgeon: Danie Binder, MD;  [facility];  Service: Endoscopy;  Laterality: N/A;  8:30 Am  . DILATION AND CURETTAGE OF UTERUS    . JOINT REPLACEMENT    . LIPOSUCTION WITH LIPOFILLING Bilateral 11/05/2016   Procedure: LIPOFILLING FROM ABDOMEN TO BILATERAL CHEST;  Surgeon: Irene Limbo, MD;  Location: [place];  Service: Plastics;  Laterality: Bilateral;  . MASTECTOMY W/ SENTINEL NODE BIOPSY Bilateral 08/13/2016   Procedure: BILATERAL TOTAL MASTECTOMY WITH RIGHT AXILLARY SENTINEL  LYMPH NODE BIOPSY;  Surgeon: [name], MD;  Location: [place];  Service: General;  Laterality: Bilateral;  . REMOVAL OF BILATERAL TISSUE EXPANDERS WITH PLACEMENT OF BILATERAL BREAST IMPLANTS Bilateral 11/05/2016   Procedure: REMOVAL OF BILATERAL TISSUE EXPANDERS WITH PLACEMENT OF BILATERAL BREAST SALINE IMPLANTS;  Surgeon: Irene Limbo, MD;  Location: [place];  Service: Plastics;  Laterality: Bilateral;  . TOTAL HIP ARTHROPLASTY Right 01/07/2016   Procedure: TOTAL HIP ARTHROPLASTY ANTERIOR APPROACH;  Surgeon: Gaynelle Arabian, MD;  Location: [facility];  Service: Orthopedics;  Laterality: Right;    SOCIAL HISTORY: Social History   Socioeconomic History  . Marital status: Married    Spouse name: Not on file  . Number of children: 1  . Years of education: Not on file  . Highest education level: Not on file  Occupational History  . Not on file  Social Needs  . Financial resource strain: Not on file  . Food insecurity:    Worry: Not  on file    Inability: Not on file  . Transportation needs:    Medical: Not on file    Non-medical: Not on file  Tobacco Use  . Smoking status: Never Smoker  . Smokeless tobacco: Never Used  Substance and Sexual Activity  . Alcohol use: No  . Drug use: No  . Sexual activity: Yes  Lifestyle  . Physical activity:    Days per week: Not on file    Minutes per session: Not on file  . Stress: Not on file  Relationships  . Social connections:    Talks on phone: Not on file    Gets together: Not on file    Attends religious service: Not on file    Active member of club or organization: Not on file    Attends meetings of clubs or organizations: Not on file    Relationship status: Not on file  . Intimate partner violence:    Fear of current or ex partner: Not on file    Emotionally abused: Not on file    Physically abused: Not on file    Forced sexual activity: Not on file  Other Topics Concern  . Not on file  Social History Narrative  . Not on file    FAMILY HISTORY: Family History  Problem Relation Age of Onset  . Arthritis Mother   . Asthma Mother   . Diabetes Mother   . Hyperlipidemia Mother   . Hypertension Mother   . Arthritis Father   . Asthma Father   . Stroke Father   . Hyperlipidemia Father   . Early death Brother 8       Muscular Dystrophy  . Birth defects Brother   . Cancer Maternal Uncle        possibly stomach  . Mental retardation Maternal Uncle   . Cancer Paternal Grandmother 47       breast cancer   . Cancer Cousin 47       paternal cousin's daughter with breast cancer  . Colon cancer Maternal Aunt        dx in her 41s  . Leukemia Paternal Uncle   . Lymphoma Paternal Uncle   . Down syndrome Paternal Uncle     ALLERGIES:  is allergic to azithromycin; biaxin [clarithromycin]; and codeine.  MEDICATIONS:  Current Outpatient Medications  Medication Sig Dispense Refill  . acetaminophen (TYLENOL 8 HOUR ARTHRITIS PAIN) 650 MG CR tablet Take 1,300 mg  by mouth every 8 (eight)  hours as needed for pain.    Marland Kitchen albuterol (PROVENTIL HFA;VENTOLIN HFA) 108 (90 Base) MCG/ACT inhaler Inhale 2 puffs into the lungs every 6 (six) hours as needed for wheezing or shortness of breath.    . benazepril (LOTENSIN) 20 MG tablet TAKE (1) TABLET BY MOUTH TWICE DAILY. 60 tablet 11  . escitalopram (LEXAPRO) 20 MG tablet TAKE ONE TABLET BY MOUTH ONCE DAILY. 90 tablet 0  . exemestane (AROMASIN) 25 MG tablet Take 1 tablet (25 mg total) by mouth daily after breakfast. 30 tablet 5  . metFORMIN (GLUCOPHAGE) 1000 MG tablet Take 1 tablet (1,000 mg total) by mouth 2 (two) times daily with a meal. 180 tablet 3  . omeprazole (PRILOSEC) 20 MG capsule TAKE (1) CAPSULE BY MOUTH ONCE DAILY. 90 capsule 3   No current facility-administered medications for this visit.     REVIEW OF SYSTEMS:  Constitutional: Denies fevers, chills or abnormal night sweats (+) hot flashes  Eyes: Denies blurriness of vision, double vision or watery eyes Ears, nose, mouth, throat, and face: Denies mucositis or sore throat Respiratory: Denies cough, dyspnea or wheezes Cardiovascular: Denies palpitation, chest discomfort or lower extremity swelling Gastrointestinal:  Denies nausea, heartburn or change in bowel habits Skin: Denies abnormal skin rashes Lymphatics: Denies new lymphadenopathy or easy bruising Neurological:Denies numbness, tingling or new weaknesses MSK: (+) osteoporsis in right hip and joint, right wrist pain, finger stiffness, in addition to exemestane  Behavioral/Psych: (+) mood swings  All other systems were reviewed with the patient and are negative.  PHYSICAL EXAMINATION: ECOG PERFORMANCE STATUS: 0 - Asymptomatic  Vitals:   12/12/17 1416  BP: (!) 142/72  Pulse: 77  Resp: 18  Temp: 98.6 F (37 C)  SpO2: 95%   Filed Weights   12/12/17 1416  Weight: 176 lb 6.4 oz (80 kg)    GENERAL:alert, no distress and comfortable SKIN: skin color, texture, turgor are normal, no rashes  or significant lesions EYES: normal, conjunctiva are pink and non-injected, sclera clear OROPHARYNX:no exudate, no erythema and lips, buccal mucosa, and tongue normal. NECK: supple, thyroid normal size, non-tender, without nodularity LYMPH:  no palpable lymphadenopathy in the cervical, axillary or inguinal LUNGS: clear to auscultation and percussion with normal breathing effort HEART: regular rate & rhythm and no murmurs and no lower extremity edema ABDOMEN:abdomen soft, non-tender and normal bowel sounds Musculoskeletal:no cyanosis of digits and no clubbing  PSYCH: alert & oriented x 3 with fluent speech NEURO: no focal motor/sensory deficits  Breasts: (+) Bilateral mastectomy with implants, symmetrical. Incisions healed well. No palpable mass.   LABORATORY DATA:  I have reviewed the data as listed CBC Latest Ref Rng & Units 12/12/2017 06/13/2017 02/17/2017  WBC 3.9 - 10.3 K/uL 5.3 5.4 4.6  Hemoglobin 11.6 - 15.9 g/dL 12.6 12.8 13.2  Hematocrit 34.8 - 46.6 % 38.3 38.5 40.9  Platelets 145 - 400 K/uL 265 273 346   CMP Latest Ref Rng & Units 12/12/2017 06/13/2017 02/17/2017  Glucose 70 - 140 mg/dL 121 114 114(H)  BUN 7 - 26 mg/dL 14 11.2 15  Creatinine 0.60 - 1.10 mg/dL 0.75 0.8 0.67  Sodium 136 - 145 mmol/L 140 140 138  Potassium 3.5 - 5.1 mmol/L 4.2 3.9 4.3  Chloride 98 - 109 mmol/L 103 - 102  CO2 22 - 29 mmol/L _0 Calcium 8.4 - 10.4 mg/dL 9.9 9.7 9.4  Total Protein 6.4 - 8.3 g/dL 7.3 7.2 6.9  Total Bilirubin 0.2 - 1.2 mg/dL 0.4 0.31 0.4  Alkaline  Phos 40 - 150 U/L 105 103 104  AST 5 - 34 U/L _0 ALT 0 - 55 U/L _1 PATHOLOGY REPORT  ONCOTYPE DX 08/13/2016   Diagnosis 08/13/2016 1. Breast, simple mastectomy, Left - LOBULAR NEOPLASIA (LOBULAR CARCINOMA IN SITU). - ATYPICAL DUCTAL HYPERPLASIA. - FLAT EPITHELIAL ATYPIA. - FIBROCYSTIC CHANGES WITH ADENOSIS AND CALCIFICATIONS. 2. Breast, simple mastectomy, Right - INVASIVE LOBULAR CARCINOMA, GRADE II/III, MULTIPLE  FOCI, SPANNING 1.5 CM, 1.5 CM AND 1.0 CM. - LOBULAR CARCINOMA IN SITU. - THE SURGICAL RESECTION MARGINS ARE NEGATIVE FOR INVASIVE CARCINOMA. - SEE ONCOLOGY TABLE BELOW. 3. Lymph node, sentinel, biopsy, Right - THERE IS NO EVIDENCE OF CARCINOMA IN 1 OF 1 LYMPH NODE (0/1). - SEE COMMENT. Microscopic Comment 2. BREAST, INVASIVE TUMOR, WITH LYMPH NODES PRESENT Specimen, including laterality and lymph node sampling (sentinel, non-sentinel): Right breast and right axillary lymph node. Procedure: Simple mastectomy and axillary lymph node resection x1. Histologic type: Lobular Grade: II Tubule formation: 3 Nuclear pleomorphism: 2 Mitotic:2 Tumor size (gross measurements): 1.5 cm, 1.5 cm, and 1.0 cm Margins: Greater than 0.2 cm to all margins. Lymphovascular invasion: Not identified. Ductal carcinoma in situ: Not identified. Tumor focality: At least three foci. Extent of tumor: Confined to breast parenchyma. Lymph nodes: 1 of 3 FINAL for Carolyn Glass, Carolyn Glass [phone]) Microscopic Comment(continued) Examined: 1 Sentinel 0 Non-sentinel 1 Total Lymph nodes with metastasis: 0 Breast prognostic profile: Estrogen receptor: Positive. Progesterone receptor: Positive. Her 2 neu: Negative. Ki-67: 5% - 10% Non-neoplastic breast: Fibrocystic changes with adenosis and calcifications. TNM: mpT1c, pN0 Comments: There are three nodules of similar appearing invasive lobular carcinoma with associated lobular carcinoma in situ. The carcinoma cells are negative for E-cadherin, supporting a lobular phenotype. 3. Immunohistochemical stains performed on two blocks of part 3 fail to highlight the presence of cytokeratin positive tumor cells. (JBK:ecj 08/17/2016) Enid Cutter MD Pathologist, Electronic Signature (Case signed 08/17/2016)  Genetic Testing 06/28/2016 Genetic testing did identify a variant called, MSH2 c.2650A>T.  Diagnosis 06/16/2016 1. Breast, right, needle core biopsy, 9 o'clock -  INVASIVE LOBULAR CARCINOMA. - LOBULAR CARCINOMA IN SITU. - SEE COMMENT. 2. Breast, right, needle core biopsy, 6:30 o'clock - INVASIVE LOBULAR CARCINOMA. - LOBULAR CARCINOMA IN SITU. - SEE COMMENT. 3. Breast, right, needle core biopsy, 6 o'clock - INVASIVE LOBULAR CARCINOMA. - LOBULAR CARCINOMA IN SITU. - SEE COMMENT. Microscopic Comment 1. Smooth muscle myosin, p63 and calponin stains are performed on the first specimen (right 9 o'clock needle core biopsy). The stains confirm the presence of both invasive and in situ carcinoma. An E-cadherin immunohistochemical stain is also performed. This additional stain is negative in both the invasive and in situ components confirming a lobular phenotype. Although definitive grading of breast carcinoma is best done on excision, the features of the invasive tumor from the right 9 o'clock breast biopsy are compatible with a grade 2 breast carcinoma. Breast prognostic markers will be performed and reported in an addendum. Findings are called to the [place] on 06/18/16. Dr. Vicente Males has seen this specimen in consultation with agreement. 2. Smooth muscle myosin, p63 and calponin stains are performed on the second specimen (right 6:30 o'clock needle core biopsy). The stains confirm the presence of both invasive and in situ carcinoma. An E-cadherin immunohistochemical stain is also performed. This additional stain is negative in both the invasive and in situ components confirming a lobular phenotype. Although definitive grading of breast carcinoma is best done on excision, the features  of the invasive tumor from the right 6:30 o'clock biopsy are compatible with a grade 2 breast carcinoma. Breast prognostic markers will be performed and reported in an addendum. Findings are called to the [place] on 06/18/16. Dr. Vicente Males has seen this specimen in consultation with agreement. 3. An E-cadherin immunohistochemical  stain is performed on the third specimen (right 6 o'clock breast biopsy). The stain fails to demonstrate positivity in the invasive or in situ component confirming the lobular nature of both. Although definitive grading of breast carcinoma is best done on excision, the features of the invasive tumor from the right 6 o'clock breast biopsy are compatible with a grade 2 breast carcinoma. Breast prognostic markers will be performed and reported in an addendum. Findings are called to the [place] on 06/18/16. Dr. Vicente Males has seen this specimen in consultation with agreement. (RAH:gt, 06/18/16) Willeen Niece MD  1. Results: IMMUNOHISTOCHEMICAL AND MORPHOMETRIC ANALYSIS PERFORMED MANUALLY Estrogen Receptor: 95%, POSITIVE, STRONG STAINING INTENSITY Progesterone Receptor: 95%, POSITIVE, STRONG STAINING INTENSITY Proliferation Marker Ki67: 10%  Results: HER2 - NEGATIVE RATIO OF HER2/CEP17 SIGNALS 1.16 AVERAGE HER2 COPY NUMBER PER CELL 2.20  2. PROGNOSTIC INDICATORS Insufficient tissue   3. PROGNOSTIC INDICATORS Results: IMMUNOHISTOCHEMICAL AND MORPHOMETRIC ANALYSIS PERFORMED MANUALLY Estrogen Receptor: 100%, POSITIVE, STRONG STAINING INTENSITY Progesterone Receptor: 100%, POSITIVE, STRONG STAINING INTENSITY Proliferation Marker Ki67: 5% Results: HER2 - NEGATIVE RATIO OF HER2/CEP17 SIGNALS 1.19 AVERAGE HER2 COPY NUMBER PER CELL 3.15   RADIOGRAPHIC STUDIES: I have personally reviewed the radiological images as listed and agreed with the findings in the report. No results found.  ASSESSMENT & PLAN: 62 y.o. Caucasian postmenopausal woman, presented image discovered multifocal right breast masses.  1. Malignant neoplasm of lower-outer quadrant of right female breast, invasive lobular carcinoma and LCIS, [id number], stage IA, G2, ER+/PR+/HER2-, oncotype low risk  -I reviewed her surgical pathology findings with patient in details. Her surgical margins was negative, 1  sentinel lymph nodes was negative. She had stage I disease. -Oncotype DX score was 11 with the 10 year risk of distant recurrence with tamoxifen alone is 7%. This is at low risk. Therefore I do not recommend chemotherapy.  -she is on adjuvant exemestane. Plan for total 5 years. -She had a bilateral mastectomy, no routine mammogram needed -The patient underwent reconstruction with saline implants on 11/05/16 by Dr. Iran Planas. -continue healthy diet and exercise regularly. -We'll continue breast cancer surveillance with routine lab, exam and follow-up -She has had mild to moderate arthralgia, especially in her fingers and wrist, still manageable.  She also has moderate hot flash, she is already on high-dose Lexapro.  She otherwise is tolerating Exemestane well. I discussed that is this worsens we can change her to a different AI or Tamoxifen if her pain becomes untolerable. I discussed we can switch her to a different anti depressant but I do not think much will change, she is on the highest dose of Lexipro. She is okay to continue with Exemestane for me.  -She is clinically doing well, her breast exam was unremarkable, her CBC and CMP are within normal limits. There is no clinical concern for recurrence.  -She will get a bone scan this year with her GYN -F/u in 6 months   2. Genetics -She has 2 family members who had breast cancer at a young age, she will qualify for genetic testing, we'll refer her. -Blood sample sent on 06/28/16. -Genetic testing did identify a VUS in MSH2, c.2650A>T.  3. DM,  type 2 -She is on metformin now, her energy sugar was 282 previously, she does not control her diet. -I previously advised the patient to check her glucose at home. -previously Advised low carb diet and exercise. -She'll follow-up with her primary care physician. -Sugar has improved. I previously suggested she continue to watch her diet and exercise to control her DM.  -Last Hba1C was 7.3%, I advised her  to f/u with PCP  4. Arthritis and arthralgia -She had a total right hip replacement, has mild arthralgia in the left hip, manageable. -We previously discussed that adjuvant hormonal therapy could make this worse. -Her pain has been tolerable with tylenol so far. I suggested she tries to remain active with stationary bike and exercise.   5. Osteopenia  -Bone scan 04/28/15 revealed AP Spine (L1-L4) T-Score = -1.7. -We discussed that exemestane may weak bone -I previously advised the patient to take Calcium and vitamin D. Will check her Vit D level yearly  -previously Advised the patient to exercise to strengthen her bones. -Due to elevated calcium seen 02/16/17 I suggested she hold calcium and vitamin D to monitor her levels and drink plenty of water.  Calcium was 10.6 in 02/2017 and now resolved at 9.7 today (06/13/17). I advised her to drink plenty of water, put more diary into her diet and take vitamin D. She is fine to not to take anymore supplemental calcium.  -She is due for bone scan this year, she will set this up with her GYN.   6. Insomnia  -I previously encouraged her to try melatonin or Benadryl for her insomnia -resolved now     PLAN -Continue Exemestane, refill today  -Lab and f/u in 6 months  -DEXA with GYN this summer 2019 -Take Vit D supplement   No orders of the defined types were placed in this encounter.   All questions were answered. The patient knows to call the clinic with any problems, questions or concerns. I spent 20 minutes counseling the patient face to face. The total time spent in the appointment was 25 minutes and more than 50% was on counseling.   This document serves as a record of services personally performed by [name], MD. It was created on her behalf by Theresia Bough, a trained medical scribe. The creation of this record is based on the scribe's personal observations and the provider's statements to them.   I have reviewed the above documentation  for accuracy and completeness, and I agree with the above.    [name], MD 12/12/2017

## 2017-12-12 ENCOUNTER — Inpatient Hospital Stay: Payer: BC Managed Care – PPO

## 2017-12-12 ENCOUNTER — Inpatient Hospital Stay: Payer: BC Managed Care – PPO | Attending: Hematology | Admitting: Hematology

## 2017-12-12 ENCOUNTER — Telehealth: Payer: Self-pay | Admitting: Hematology

## 2017-12-12 ENCOUNTER — Encounter: Payer: Self-pay | Admitting: Hematology

## 2017-12-12 VITALS — BP 142/72 | HR 77 | Temp 98.6°F | Resp 18 | Ht 63.0 in | Wt 176.4 lb

## 2017-12-12 DIAGNOSIS — Z800 Family history of malignant neoplasm of digestive organs: Secondary | ICD-10-CM

## 2017-12-12 DIAGNOSIS — K219 Gastro-esophageal reflux disease without esophagitis: Secondary | ICD-10-CM | POA: Diagnosis not present

## 2017-12-12 DIAGNOSIS — C50511 Malignant neoplasm of lower-outer quadrant of right female breast: Secondary | ICD-10-CM

## 2017-12-12 DIAGNOSIS — Z79811 Long term (current) use of aromatase inhibitors: Secondary | ICD-10-CM | POA: Diagnosis not present

## 2017-12-12 DIAGNOSIS — Z7984 Long term (current) use of oral hypoglycemic drugs: Secondary | ICD-10-CM | POA: Insufficient documentation

## 2017-12-12 DIAGNOSIS — Z170 Estrogen receptor positive status [ER+]: Principal | ICD-10-CM

## 2017-12-12 DIAGNOSIS — K760 Fatty (change of) liver, not elsewhere classified: Secondary | ICD-10-CM | POA: Insufficient documentation

## 2017-12-12 DIAGNOSIS — M8580 Other specified disorders of bone density and structure, unspecified site: Secondary | ICD-10-CM | POA: Diagnosis not present

## 2017-12-12 DIAGNOSIS — M1990 Unspecified osteoarthritis, unspecified site: Secondary | ICD-10-CM | POA: Diagnosis not present

## 2017-12-12 DIAGNOSIS — Z9013 Acquired absence of bilateral breasts and nipples: Secondary | ICD-10-CM | POA: Diagnosis not present

## 2017-12-12 DIAGNOSIS — Z9882 Breast implant status: Secondary | ICD-10-CM | POA: Diagnosis not present

## 2017-12-12 DIAGNOSIS — E119 Type 2 diabetes mellitus without complications: Secondary | ICD-10-CM | POA: Insufficient documentation

## 2017-12-12 DIAGNOSIS — M8588 Other specified disorders of bone density and structure, other site: Secondary | ICD-10-CM

## 2017-12-12 DIAGNOSIS — Z803 Family history of malignant neoplasm of breast: Secondary | ICD-10-CM | POA: Diagnosis not present

## 2017-12-12 DIAGNOSIS — Z79899 Other long term (current) drug therapy: Secondary | ICD-10-CM

## 2017-12-12 DIAGNOSIS — Z806 Family history of leukemia: Secondary | ICD-10-CM | POA: Diagnosis not present

## 2017-12-12 DIAGNOSIS — G4700 Insomnia, unspecified: Secondary | ICD-10-CM | POA: Diagnosis not present

## 2017-12-12 DIAGNOSIS — F418 Other specified anxiety disorders: Secondary | ICD-10-CM | POA: Diagnosis not present

## 2017-12-12 LAB — COMPREHENSIVE METABOLIC PANEL
ALT: 23 U/L (ref 0–55)
AST: 18 U/L (ref 5–34)
Albumin: 4.1 g/dL (ref 3.5–5.0)
Alkaline Phosphatase: 105 U/L (ref 40–150)
Anion gap: 10 (ref 3–11)
BUN: 14 mg/dL (ref 7–26)
CHLORIDE: 103 mmol/L (ref 98–109)
CO2: 27 mmol/L (ref 22–29)
Calcium: 9.9 mg/dL (ref 8.4–10.4)
Creatinine, Ser: 0.75 mg/dL (ref 0.60–1.10)
GFR calc Af Amer: >60 mL/min (ref >60)
GFR calc non Af Amer: >60 mL/min (ref >60)
GLUCOSE: 121 mg/dL (ref 70–140)
POTASSIUM: 4.2 mmol/L (ref 3.5–5.1)
Sodium: 140 mmol/L (ref 136–145)
Total Bilirubin: 0.4 mg/dL (ref 0.2–1.2)
Total Protein: 7.3 g/dL (ref 6.4–8.3)

## 2017-12-12 LAB — CBC WITH DIFFERENTIAL/PLATELET
Basophils Absolute: 0 10*3/uL (ref 0.0–0.1)
Basophils Relative: 1 %
EOS PCT: 3 %
Eosinophils Absolute: 0.1 10*3/uL (ref 0.0–0.5)
HCT: 38.3 % (ref 34.8–46.6)
Hemoglobin: 12.6 g/dL (ref 11.6–15.9)
LYMPHS ABS: 1.6 10*3/uL (ref 0.9–3.3)
LYMPHS PCT: 30 %
MCH: 31.2 pg (ref 25.1–34.0)
MCHC: 32.9 g/dL (ref 31.5–36.0)
MCV: 94.9 fL (ref 79.5–101.0)
MONO ABS: 0.4 10*3/uL (ref 0.1–0.9)
Monocytes Relative: 8 %
Neutro Abs: 3.1 10*3/uL (ref 1.5–6.5)
Neutrophils Relative %: 58 %
PLATELETS: 265 10*3/uL (ref 145–400)
RBC: 4.04 MIL/uL (ref 3.70–5.45)
RDW: 13.4 % (ref 11.2–14.5)
WBC: 5.3 10*3/uL (ref 3.9–10.3)

## 2017-12-12 MED ORDER — EXEMESTANE 25 MG PO TABS: 25.0000 mg | ORAL_TABLET | Freq: Every day | ORAL | 5 refills | Status: DC

## 2017-12-12 NOTE — Telephone Encounter (Signed)
Gave patient avs and calendar with appts per 4/8 los.  °

## 2017-12-23 ENCOUNTER — Other Ambulatory Visit: Payer: Self-pay | Admitting: Family Medicine

## 2018-01-18 ENCOUNTER — Other Ambulatory Visit: Payer: Self-pay | Admitting: Family Medicine

## 2018-02-14 ENCOUNTER — Ambulatory Visit: Payer: BC Managed Care – PPO | Admitting: Family Medicine

## 2018-02-14 ENCOUNTER — Encounter: Payer: Self-pay | Admitting: Family Medicine

## 2018-02-14 VITALS — BP 144/90 | HR 76 | Temp 98.6°F | Resp 14 | Ht 63.0 in | Wt 170.0 lb

## 2018-02-14 DIAGNOSIS — Z23 Encounter for immunization: Secondary | ICD-10-CM | POA: Diagnosis not present

## 2018-02-14 DIAGNOSIS — M19042 Primary osteoarthritis, left hand: Secondary | ICD-10-CM

## 2018-02-14 DIAGNOSIS — I10 Essential (primary) hypertension: Secondary | ICD-10-CM

## 2018-02-14 DIAGNOSIS — M19041 Primary osteoarthritis, right hand: Secondary | ICD-10-CM | POA: Diagnosis not present

## 2018-02-14 DIAGNOSIS — E119 Type 2 diabetes mellitus without complications: Secondary | ICD-10-CM

## 2018-02-14 MED ORDER — DICLOFENAC SODIUM 1 % TD GEL: 4.0000 g | Freq: Four times a day (QID) | TRANSDERMAL | 1 refills | Status: DC

## 2018-02-14 NOTE — Addendum Note (Signed)
Addended by: Shary Decamp B on: 02/14/2018 10:25 AM   Modules accepted: Orders

## 2018-02-14 NOTE — Progress Notes (Signed)
Subjective:    Patient ID: Carolyn Glass, female    DOB: 11-12-1955, 62 y.o.   MRN: [id number]  Medication Refill     Last June, the patient's hemoglobin A1c was elevated at greater than 7.  She is still taking metformin but she has made a greater effort to try to lose weight and exercise more.  In fact she is down 10 pounds by her scales if not more.  She is also watching her diet.  She denies any polyuria, polydipsia, or blurry vision.  She denies any neuropathy in her feet.  She denies any numbness or tingling in her feet.  She denies any chest pain shortness of breath or dyspnea on exertion.  Her blood pressure today is marginally elevated at 144/90.  She is compliant taking benazepril 20 mg twice daily.  She also complains of pain and stiffness in her MCP and PIP joints bilaterally on both hands.  This improves only minimally with Tylenol.  In the past she took meloxicam and Naprosyn and developed stomach irritation and so she avoids NSAIDs Past Medical History:  Diagnosis Date  . Anxiety   . Arthritis    OA hips  . Asthma    allergy and exercise induced asthma   . Depression   . Diabetes mellitus without complication ([place])   . Family history of breast cancer   . Family history of colon cancer   . Family history of stomach cancer   . GERD (gastroesophageal reflux disease)   . Hypertension   . Malignant neoplasm of lower-outer quadrant of right female breast ([place]) 06/21/2016  . Seasonal allergies    Past Surgical History:  Procedure Laterality Date  . BREAST LUMPECTOMY     right   . BREAST RECONSTRUCTION WITH PLACEMENT OF TISSUE EXPANDER AND FLEX HD (ACELLULAR HYDRATED DERMIS) Bilateral 08/13/2016   Procedure: BREAST RECONSTRUCTION WITH PLACEMENT OF TISSUE EXPANDER AND ALLODERM;  Surgeon: Irene Limbo, MD;  Location: [place];  Service: Plastics;  Laterality: Bilateral;  . COLONOSCOPY N/A 10/31/2017   Procedure: COLONOSCOPY;  Surgeon: Danie Binder, MD;   [facility];  Service: Endoscopy;  Laterality: N/A;  8:30 Am  . DILATION AND CURETTAGE OF UTERUS    . JOINT REPLACEMENT    . LIPOSUCTION WITH LIPOFILLING Bilateral 11/05/2016   Procedure: LIPOFILLING FROM ABDOMEN TO BILATERAL CHEST;  Surgeon: Irene Limbo, MD;  Location: [place];  Service: Plastics;  Laterality: Bilateral;  . MASTECTOMY W/ SENTINEL NODE BIOPSY Bilateral 08/13/2016   Procedure: BILATERAL TOTAL MASTECTOMY WITH RIGHT AXILLARY SENTINEL LYMPH NODE BIOPSY;  Surgeon: [name], MD;  Location: [place];  Service: General;  Laterality: Bilateral;  . REMOVAL OF BILATERAL TISSUE EXPANDERS WITH PLACEMENT OF BILATERAL BREAST IMPLANTS Bilateral 11/05/2016   Procedure: REMOVAL OF BILATERAL TISSUE EXPANDERS WITH PLACEMENT OF BILATERAL BREAST SALINE IMPLANTS;  Surgeon: Irene Limbo, MD;  Location: [place];  Service: Plastics;  Laterality: Bilateral;  . TOTAL HIP ARTHROPLASTY Right 01/07/2016   Procedure: TOTAL HIP ARTHROPLASTY ANTERIOR APPROACH;  Surgeon: Gaynelle Arabian, MD;  [facility];  Service: Orthopedics;  Laterality: Right;   Current Outpatient Medications on File Prior to Visit  Medication Sig Dispense Refill  . acetaminophen (TYLENOL 8 HOUR ARTHRITIS PAIN) 650 MG CR tablet Take 1,300 mg by mouth every 8 (eight) hours as needed for pain.    Marland Kitchen albuterol (PROVENTIL HFA;VENTOLIN HFA) 108 (90 Base) MCG/ACT inhaler Inhale 2 puffs into the lungs every  6 (six) hours as needed for wheezing or shortness of breath.    . benazepril (LOTENSIN) 20 MG tablet TAKE (1) TABLET BY MOUTH TWICE DAILY. 60 tablet 11  . benazepril (LOTENSIN) 20 MG tablet TAKE (1) TABLET BY MOUTH TWICE DAILY. 60 tablet 0  . escitalopram (LEXAPRO) 20 MG tablet TAKE ONE TABLET BY MOUTH ONCE DAILY. 90 tablet 2  . exemestane (AROMASIN) 25 MG tablet Take 1 tablet (25 mg total) by mouth daily after breakfast. 30 tablet 5  . metFORMIN (GLUCOPHAGE) 1000 MG  tablet Take 1 tablet (1,000 mg total) by mouth 2 (two) times daily with a meal. 180 tablet 3  . omeprazole (PRILOSEC) 20 MG capsule TAKE (1) CAPSULE BY MOUTH ONCE DAILY. 90 capsule 3   No current facility-administered medications on file prior to visit.    Allergies  Allergen Reactions  . Azithromycin Itching and Rash  . Biaxin [Clarithromycin] Rash  . Codeine Rash    Thinks a codeine cough syrup caused rash.   Social History   Socioeconomic History  . Marital status: Married    Spouse name: Not on file  . Number of children: 1  . Years of education: Not on file  . Highest education level: Not on file  Occupational History  . Not on file  Social Needs  . Financial resource strain: Not on file  . Food insecurity:    Worry: Not on file    Inability: Not on file  . Transportation needs:    Medical: Not on file    Non-medical: Not on file  Tobacco Use  . Smoking status: Never Smoker  . Smokeless tobacco: Never Used  Substance and Sexual Activity  . Alcohol use: No  . Drug use: No  . Sexual activity: Yes  Lifestyle  . Physical activity:    Days per week: Not on file    Minutes per session: Not on file  . Stress: Not on file  Relationships  . Social connections:    Talks on phone: Not on file    Gets together: Not on file    Attends religious service: Not on file    Active member of club or organization: Not on file    Attends meetings of clubs or organizations: Not on file    Relationship status: Not on file  . Intimate partner violence:    Fear of current or ex partner: Not on file    Emotionally abused: Not on file    Physically abused: Not on file    Forced sexual activity: Not on file  Other Topics Concern  . Not on file  Social History Narrative  . Not on file   Family History  Problem Relation Age of Onset  . Arthritis Mother   . Asthma Mother   . Diabetes Mother   . Hyperlipidemia Mother   . Hypertension Mother   . Arthritis Father   . Asthma  Father   . Stroke Father   . Hyperlipidemia Father   . Early death Brother 8       Muscular Dystrophy  . Birth defects Brother   . Cancer Maternal Uncle        possibly stomach  . Mental retardation Maternal Uncle   . Cancer Paternal Grandmother 41       breast cancer   . Cancer Cousin 65       paternal cousin's daughter with breast cancer  . Colon cancer Maternal Aunt  dx in her 60s  . Leukemia Paternal Uncle   . Lymphoma Paternal Uncle   . Down syndrome Paternal Uncle       Review of Systems  All other systems reviewed and are negative.      Objective:   Physical Exam  Constitutional: She is oriented to person, place, and time. She appears well-developed and well-nourished. No distress.  HENT:  Head: Normocephalic and atraumatic.  Right Ear: External ear normal.  Left Ear: External ear normal.  Nose: Nose normal.  Mouth/Throat: Oropharynx is clear and moist. No oropharyngeal exudate.  Eyes: Pupils are equal, round, and reactive to light. Conjunctivae and EOM are normal. Right eye exhibits no discharge. Left eye exhibits no discharge. No scleral icterus.  Neck: Normal range of motion. Neck supple. No JVD present. No tracheal deviation present. No thyromegaly present.  Cardiovascular: Normal rate, regular rhythm, normal heart sounds and intact distal pulses. Exam reveals no gallop and no friction rub.  No murmur heard. Pulmonary/Chest: Effort normal and breath sounds normal. No stridor. No respiratory distress. She has no wheezes. She has no rales. She exhibits no tenderness.  Abdominal: Soft. Bowel sounds are normal. She exhibits no distension and no mass. There is no tenderness. There is no rebound and no guarding.  Musculoskeletal: Normal range of motion. She exhibits no edema, tenderness or deformity.  Lymphadenopathy:    She has no cervical adenopathy.  Neurological: She is alert and oriented to person, place, and time. She has normal reflexes. No cranial nerve  deficit. She exhibits normal muscle tone. Coordination normal.  Skin: Skin is warm. No rash noted. She is not diaphoretic. No erythema. No pallor.  Psychiatric: She has a normal mood and affect. Her behavior is normal. Judgment and thought content normal.  Vitals reviewed.         Assessment & Plan:  Controlled type 2 diabetes mellitus without complication, without long-term current use of insulin (HCC) - Plan: COMPLETE METABOLIC PANEL WITH GFR, Lipid panel, Microalbumin, urine, Hemoglobin A1c  Benign essential HTN osteoarthritis hands  Check fasting lab work.  Goal hemoglobin A1c is less than 6.5.  Blood pressure is elevated beyond what I would like it to be today but the patient believes this is due to rushing around and not taking her medication this morning.  Therefore she will take her blood pressure numerous times a day over the next week and monitor closely.  If greater than 140/90, I would add hydrochlorothiazide to her medication regimen.  I will check a fasting lipid panel.  Goal LDL cholesterol is less than 100.  I will also check a urine microalbumin.  I recommended an annual diabetic eye exam but the patient prefers to schedule this for herself.  I also gave the patient Pneumovax 23.  Diabetic foot exam is performed today and is normal.  We will try the patient on Voltaren gel 2 g 4 times daily for osteoarthritis in the hands to avoid stomach irritation

## 2018-02-15 ENCOUNTER — Telehealth: Payer: Self-pay | Admitting: Family Medicine

## 2018-02-15 LAB — COMPLETE METABOLIC PANEL WITH GFR
AG RATIO: 1.8 (calc) (ref 1.0–2.5)
ALKALINE PHOSPHATASE (APISO): 90 U/L (ref 33–130)
ALT: 20 U/L (ref 6–29)
AST: 19 U/L (ref 10–35)
Albumin: 4.6 g/dL (ref 3.6–5.1)
BILIRUBIN TOTAL: 0.5 mg/dL (ref 0.2–1.2)
BUN: 13 mg/dL (ref 7–25)
CALCIUM: 9.8 mg/dL (ref 8.6–10.4)
CHLORIDE: 101 mmol/L (ref 98–110)
CO2: 27 mmol/L (ref 20–32)
Creat: 0.69 mg/dL (ref 0.50–0.99)
GFR, Est African American: 108 mL/min/{1.73_m2} (ref >=60)
GFR, Est Non African American: 93 mL/min/{1.73_m2} (ref >=60)
GLOBULIN: 2.6 g/dL (ref 1.9–3.7)
Glucose, Bld: 129 mg/dL — ABNORMAL HIGH (ref 65–99)
POTASSIUM: 4.2 mmol/L (ref 3.5–5.3)
SODIUM: 140 mmol/L (ref 135–146)
Total Protein: 7.2 g/dL (ref 6.1–8.1)

## 2018-02-15 LAB — HEMOGLOBIN A1C
EAG (MMOL/L): 7.9 (calc)
Hgb A1c MFr Bld: 6.6 % of total Hgb — ABNORMAL HIGH (ref <5.7)
MEAN PLASMA GLUCOSE: 143 (calc)

## 2018-02-15 LAB — LIPID PANEL
CHOLESTEROL: 170 mg/dL (ref <200)
HDL: 39 mg/dL — AB (ref >50)
LDL Cholesterol (Calc): 103 mg/dL (calc) — ABNORMAL HIGH
Non-HDL Cholesterol (Calc): 131 mg/dL (calc) — ABNORMAL HIGH (ref <130)
TRIGLYCERIDES: 165 mg/dL — AB (ref <150)
Total CHOL/HDL Ratio: 4.4 (calc) (ref <5.0)

## 2018-02-15 LAB — MICROALBUMIN, URINE: Microalb, Ur: 1 mg/dL

## 2018-02-15 NOTE — Telephone Encounter (Signed)
PA Submitted through [email or web address] and received the following:  Your information has been submitted to [employer]. To check for an updated outcome later, reopen this PA request from your dashboard.  If [employer] has not responded to your request within 24 hours, contact [employer] at [phone]. If you think there may be a problem with your PA request, use our live chat feature at the bottom right. 

## 2018-02-16 ENCOUNTER — Encounter (INDEPENDENT_AMBULATORY_CARE_PROVIDER_SITE_OTHER): Payer: Self-pay

## 2018-02-16 MED ORDER — DICLOFENAC SODIUM 1 % TD GEL: 4.0000 g | Freq: Four times a day (QID) | TRANSDERMAL | 1 refills | Status: DC

## 2018-02-16 NOTE — Telephone Encounter (Signed)
Appro[place] June 12 Your PA request has been approved. Additional information will be provided in the approval communication. (Message 1145) Pharm aware

## 2018-02-21 ENCOUNTER — Other Ambulatory Visit: Payer: Self-pay | Admitting: Family Medicine

## 2018-02-28 NOTE — H&P (Signed)
TOTAL HIP ADMISSION H&P  Patient is admitted for left total hip arthroplasty.  Subjective:  Chief Complaint: left hip pain  HPI: Carolyn Glass, 62 y.o. female, has a history of pain and functional disability in the left hip(s) due to arthritis and patient has failed non-surgical conservative treatments for greater than 12 weeks to include NSAID's and/or analgesics, corticosteriod injections and activity modification.  Onset of symptoms was gradual starting several years ago with gradually worsening course since that time.The patient noted no past surgery on the left hip(s).  Patient currently rates pain in the left hip at 9 out of 10 with activity. Patient has worsening of pain with activity and weight bearing, pain that interfers with activities of daily living and pain with passive range of motion. Patient has evidence of periarticular osteophytes and joint space narrowing by imaging studies. This condition presents safety issues increasing the risk of falls. There is no current active infection.  Patient Active Problem List   Diagnosis Date Noted  . Special screening for malignant neoplasms, colon   . Osteopenia 11/16/2016  . Genetic testing 07/09/2016  . Family history of breast cancer   . Family history of colon cancer   . Family history of stomach cancer   . Malignant neoplasm of lower-outer quadrant of right female breast ([place]) 06/21/2016  . OA (osteoarthritis) of hip 01/07/2016   Past Medical History:  Diagnosis Date  . Anxiety   . Arthritis    OA hips  . Asthma    allergy and exercise induced asthma   . Depression   . Diabetes mellitus without complication ([place])   . Family history of breast cancer   . Family history of colon cancer   . Family history of stomach cancer   . GERD (gastroesophageal reflux disease)   . Hypertension   . Malignant neoplasm of lower-outer quadrant of right female breast ([place]) 06/21/2016  . Seasonal allergies     Past Surgical History:    Procedure Laterality Date  . BREAST LUMPECTOMY     right   . BREAST RECONSTRUCTION WITH PLACEMENT OF TISSUE EXPANDER AND FLEX HD (ACELLULAR HYDRATED DERMIS) Bilateral 08/13/2016   Procedure: BREAST RECONSTRUCTION WITH PLACEMENT OF TISSUE EXPANDER AND ALLODERM;  Surgeon: Irene Limbo, MD;  Location: [facility] [place];  Service: Plastics;  Laterality: Bilateral;  . COLONOSCOPY N/A 10/31/2017   Procedure: COLONOSCOPY;  Surgeon: Danie Binder, MD;  [facility];  Service: Endoscopy;  Laterality: N/A;  8:30 Am  . DILATION AND CURETTAGE OF UTERUS    . JOINT REPLACEMENT    . LIPOSUCTION WITH LIPOFILLING Bilateral 11/05/2016   Procedure: LIPOFILLING FROM ABDOMEN TO BILATERAL CHEST;  Surgeon: Irene Limbo, MD;  Location: [place];  Service: Plastics;  Laterality: Bilateral;  . MASTECTOMY W/ SENTINEL NODE BIOPSY Bilateral 08/13/2016   Procedure: BILATERAL TOTAL MASTECTOMY WITH RIGHT AXILLARY SENTINEL LYMPH NODE BIOPSY;  Surgeon: [name], MD;  Location: [place];  Service: General;  Laterality: Bilateral;  . REMOVAL OF BILATERAL TISSUE EXPANDERS WITH PLACEMENT OF BILATERAL BREAST IMPLANTS Bilateral 11/05/2016   Procedure: REMOVAL OF BILATERAL TISSUE EXPANDERS WITH PLACEMENT OF BILATERAL BREAST SALINE IMPLANTS;  Surgeon: Irene Limbo, MD;  Location: [place];  Service: Plastics;  Laterality: Bilateral;  . TOTAL HIP ARTHROPLASTY Right 01/07/2016   Procedure: TOTAL HIP ARTHROPLASTY ANTERIOR APPROACH;  Surgeon: Gaynelle Arabian, MD;  [facility];  Service: Orthopedics;  Laterality: Right;    No current facility-administered medications for  this encounter.    Current Outpatient Medications  Medication Sig Dispense Refill Last Dose  . acetaminophen (TYLENOL 8 HOUR ARTHRITIS PAIN) 650 MG CR tablet Take 1,300 mg by mouth every 8 (eight) hours as needed for pain.   Taking  . albuterol (PROVENTIL HFA;VENTOLIN HFA) 108 (90  Base) MCG/ACT inhaler Inhale 2 puffs into the lungs every 6 (six) hours as needed for wheezing or shortness of breath.   Taking  . benazepril (LOTENSIN) 20 MG tablet TAKE (1) TABLET BY MOUTH TWICE DAILY. 60 tablet 11 Taking  . benazepril (LOTENSIN) 20 MG tablet TAKE (1) TABLET BY MOUTH TWICE DAILY. 60 tablet 0 Taking  . diclofenac sodium (VOLTAREN) 1 % GEL Apply 4 g topically 4 (four) times daily. 100 g 1   . escitalopram (LEXAPRO) 20 MG tablet TAKE ONE TABLET BY MOUTH ONCE DAILY. 90 tablet 2 Taking  . exemestane (AROMASIN) 25 MG tablet Take 1 tablet (25 mg total) by mouth daily after breakfast. 30 tablet 5 Taking  . metFORMIN (GLUCOPHAGE) 1000 MG tablet TAKE 1 TABLET BY MOUTH TWICE DAILY WITH MEALS. 180 tablet 0   . omeprazole (PRILOSEC) 20 MG capsule TAKE (1) CAPSULE BY MOUTH ONCE DAILY. 90 capsule 3 Taking   Allergies  Allergen Reactions  . Azithromycin Itching and Rash  . Biaxin [Clarithromycin] Rash  . Codeine Rash    Thinks a codeine cough syrup caused rash.    Social History   Tobacco Use  . Smoking status: Never Smoker  . Smokeless tobacco: Never Used  Substance Use Topics  . Alcohol use: No    Family History  Problem Relation Age of Onset  . Arthritis Mother   . Asthma Mother   . Diabetes Mother   . Hyperlipidemia Mother   . Hypertension Mother   . Arthritis Father   . Asthma Father   . Stroke Father   . Hyperlipidemia Father   . Early death Brother 8       Muscular Dystrophy  . Birth defects Brother   . Cancer Maternal Uncle        possibly stomach  . Mental retardation Maternal Uncle   . Cancer Paternal Grandmother 26       breast cancer   . Cancer Cousin 33       paternal cousin's daughter with breast cancer  . Colon cancer Maternal Aunt        dx in her 34s  . Leukemia Paternal Uncle   . Lymphoma Paternal Uncle   . Down syndrome Paternal Uncle      Review of Systems  Constitutional: Negative for chills and fever.  HENT: Negative for congestion, sore  throat and tinnitus.   Eyes: Negative for double vision, photophobia and pain.  Respiratory: Negative for cough, shortness of breath and wheezing.   Cardiovascular: Negative for chest pain, palpitations and orthopnea.  Gastrointestinal: Negative for heartburn, nausea and vomiting.  Genitourinary: Negative for dysuria, frequency and urgency.  Musculoskeletal: Positive for joint pain.  Neurological: Negative for dizziness, weakness and headaches.  Psychiatric/Behavioral: Negative for depression.    Objective:  Physical Exam  Overweight and well developed. General: Alert and oriented x3, cooperative and pleasant, no acute distress. Head: normocephalic, atraumatic, neck supple. Eyes: EOMI. Respiratory: breath sounds clear in all fields, no wheezing, rales, or rhonchi. Cardiovascular: Regular rate and rhythm, no murmurs, gallops or rubs.  Abdomen: non-tender to palpation and soft, normoactive bowel sounds. Musculoskeletal: Right Hip: No tenderness to palpation about the right greater trochanteric bursa.  Mild tenderness to palpation about the left greater trochanteric bursa. No masses or tumors palpated about the hips.  Left Hip: Pain with passive and active motion of the left hip. ROM right hip 140 degrees flexion, 30 degrees internal rotation, 40 degrees external rotation, and 40 degrees abduction. ROM left hip 120 degrees flexion, 10 degrees internal rotation, 20 degrees external rotation, and 25 degrees abduction. Motion of the left hip does not cause pain into the left knee. Calves soft and nontender. Motor function intact in LE. Strength 5/5 LE bilaterally. Neuro: Distal pulses 2+. Sensation to light touch intact in LE.  Labs:  Estimated body mass index is 30.11 kg/m as calculated from the following:   Height as of 02/14/18: 5\' 3"  (1.6 m).   Weight as of 02/14/18: 77.1 kg (170 lb).   Imaging Review Plain radiographs demonstrate moderate degenerative joint disease of the left hip(s).  The bone quality appears to be adequate for age and reported activity level.  Preoperative templating of the joint replacement has been completed, documented, and submitted to the Operating Room personnel in order to optimize intra-operative equipment management.  Assessment/Plan:  End stage arthritis, left hip(s)  The patient history, physical examination, clinical judgement of the provider and imaging studies are consistent with end stage degenerative joint disease of the left hip(s) and total hip arthroplasty is deemed medically necessary. The treatment options including medical management, injection therapy, arthroscopy and arthroplasty were discussed at length. The risks and benefits of total hip arthroplasty were presented and reviewed. The risks due to aseptic loosening, infection, stiffness, dislocation/subluxation,  thromboembolic complications and other imponderables were discussed.  The patient acknowledged the explanation, agreed to proceed with the plan and consent was signed. Patient is being admitted for inpatient treatment for surgery, pain control, PT, OT, prophylactic antibiotics, VTE prophylaxis, progressive ambulation and ADL's and discharge planning.The patient is planning to be discharged home with home health services.    Therapy Plans: HHPT Disposition: Home with husband Planned DVT Prophylaxis: Xarelto 10 mg daily (hx of breast CA) DME needed: None PCP: Dr. Dennard Schaumann ([place]) TXA: IV Allergies: Codeine NO IV/BP in RIGHT arm  - Patient was instructed on what medications to stop prior to surgery. - Follow-up visit in 2 weeks with Dr. Wynelle Link - Begin physical therapy following surgery - Pre-operative lab work as pre-surgical testing - Prescriptions will be provided in hospital at time of discharge  Theresa Duty, PA-C Orthopedic Surgery [facility] [place]

## 2018-03-02 ENCOUNTER — Encounter: Payer: Self-pay | Admitting: Family Medicine

## 2018-03-10 NOTE — Patient Instructions (Signed)
BATINA DOUGAN  03/10/2018   Your procedure is scheduled on: 03-22-18   Report to [facility] Main  Entrance    Report to Admitting at 10:15 AM    Call this number if you have problems the morning of surgery [phone]   Remember: Do not eat food or drink liquids :After Midnight.     Take these medicines the morning of surgery with A SIP OF WATER: Escitalopram (Lexapro), Exemestane (Aromasin), Omeprazole (Prilosec). You may also bring and use your inhaler as needed.   DO NOT TAKE ANY DIABETIC MEDICATIONS DAY OF YOUR SURGERY                               You may not have any metal on your body including hair pins and              piercings  Do not wear jewelry, make-up, lotions, powders or perfumes, deodorant             Do not wear nail polish.  Do not shave  48 hours prior to surgery.                Do not bring valuables to the hospital. [place].  Contacts, dentures or bridgework may not be worn into surgery.  Leave suitcase in the car. After surgery it may be brought to your room.     Special Instructions: N/A              Please read over the following fact sheets you were given: _____________________________________________________________________ How to Manage Your Diabetes Before and After Surgery  Why is it important to control my blood sugar before and after surgery? . Improving blood sugar levels before and after surgery helps healing and can limit problems. . A way of improving blood sugar control is eating a healthy diet by: o  Eating less sugar and carbohydrates o  Increasing activity/exercise o  Talking with your doctor about reaching your blood sugar goals . High blood sugars (greater than 180 mg/dL) can raise your risk of infections and slow your recovery, so you will need to focus on controlling your diabetes during the weeks before surgery. . Make sure that the doctor who takes care  of your diabetes knows about your planned surgery including the date and location.  How do I manage my blood sugar before surgery? . Check your blood sugar at least 4 times a day, starting 2 days before surgery, to make sure that the level is not too high or low. o Check your blood sugar the morning of your surgery when you wake up and every 2 hours until you get to the Short Stay unit. . If your blood sugar is less than 70 mg/dL, you will need to treat for low blood sugar: o Do not take insulin. o Treat a low blood sugar (less than 70 mg/dL) with  cup of clear juice (cranberry or apple), 4 glucose tablets, OR glucose gel. o Recheck blood sugar in 15 minutes after treatment (to make sure it is greater than 70 mg/dL). If your blood sugar is not greater than 70 mg/dL on recheck, call [phone] for further instructions. . Report your blood sugar to the short stay  nurse when you get to Short Stay.  . If you are admitted to the hospital after surgery: o Your blood sugar will be checked by the staff and you will probably be given insulin after surgery (instead of oral diabetes medicines) to make sure you have good blood sugar levels. o The goal for blood sugar control after surgery is 80-180 mg/dL.   WHAT DO I DO ABOUT MY DIABETES MEDICATION?  Marland Kitchen Do not take oral diabetes medicines (pills) the morning of surgery.  . THE DAY BEFORE SURGERY, take your usual dose of Metformin                 - Preparing for Surgery Before surgery, you can play an important role.  Because skin is not sterile, your skin needs to be as free of germs as possible.  You can reduce the number of germs on your skin by washing with CHG (chlorahexidine gluconate) soap before surgery.  CHG is an antiseptic cleaner which kills germs and bonds with the skin to continue killing germs even after washing. Please DO NOT use if you have an allergy to CHG or antibacterial soaps.  If your skin becomes reddened/irritated  stop using the CHG and inform your nurse when you arrive at Short Stay. Do not shave (including legs and underarms) for at least 48 hours prior to the first CHG shower.  You may shave your face/neck. Please follow these instructions carefully:  1.  Shower with CHG Soap the night before surgery and the  morning of Surgery.  2.  If you choose to wash your hair, wash your hair first as usual with your  normal  shampoo.  3.  After you shampoo, rinse your hair and body thoroughly to remove the  shampoo.                           4.  Use CHG as you would any other liquid soap.  You can apply chg directly  to the skin and wash                       Gently with a scrungie or clean washcloth.  5.  Apply the CHG Soap to your body ONLY FROM THE NECK DOWN.   Do not use on face/ open                           Wound or open sores. Avoid contact with eyes, ears mouth and genitals (private parts).                       Wash face,  Genitals (private parts) with your normal soap.             6.  Wash thoroughly, paying special attention to the area where your surgery  will be performed.  7.  Thoroughly rinse your body with warm water from the neck down.  8.  DO NOT shower/wash with your normal soap after using and rinsing off  the CHG Soap.                9.  Pat yourself dry with a clean towel.            10.  Wear clean pajamas.            11.  Place clean sheets on your bed the  night of your first shower and do not  sleep with pets. Day of Surgery : Do not apply any lotions/deodorants the morning of surgery.  Please wear clean clothes to the hospital/surgery center.  FAILURE TO FOLLOW THESE INSTRUCTIONS MAY RESULT IN THE CANCELLATION OF YOUR SURGERY PATIENT SIGNATURE_________________________________  NURSE SIGNATURE__________________________________  ________________________________________________________________________   Adam Phenix  An incentive spirometer is a tool that can help keep your  lungs clear and active. This tool measures how well you are filling your lungs with each breath. Taking long deep breaths may help reverse or decrease the chance of developing breathing (pulmonary) problems (especially infection) following:  A long period of time when you are unable to move or be active. BEFORE THE PROCEDURE   If the spirometer includes an indicator to show your best effort, your nurse or respiratory therapist will set it to a desired goal.  If possible, sit up straight or lean slightly forward. Try not to slouch.  Hold the incentive spirometer in an upright position. INSTRUCTIONS FOR USE  1. Sit on the edge of your bed if possible, or sit up as far as you can in bed or on a chair. 2. Hold the incentive spirometer in an upright position. 3. Breathe out normally. 4. Place the mouthpiece in your mouth and seal your lips tightly around it. 5. Breathe in slowly and as deeply as possible, raising the piston or the ball toward the top of the column. 6. Hold your breath for 3-5 seconds or for as long as possible. Allow the piston or ball to fall to the bottom of the column. 7. Remove the mouthpiece from your mouth and breathe out normally. 8. Rest for a few seconds and repeat Steps 1 through 7 at least 10 times every 1-2 hours when you are awake. Take your time and take a few normal breaths between deep breaths. 9. The spirometer may include an indicator to show your best effort. Use the indicator as a goal to work toward during each repetition. 10. After each set of 10 deep breaths, practice coughing to be sure your lungs are clear. If you have an incision (the cut made at the time of surgery), support your incision when coughing by placing a pillow or rolled up towels firmly against it. Once you are able to get out of bed, walk around indoors and cough well. You may stop using the incentive spirometer when instructed by your caregiver.  RISKS AND COMPLICATIONS  Take your time so  you do not get dizzy or light-headed.  If you are in pain, you may need to take or ask for pain medication before doing incentive spirometry. It is harder to take a deep breath if you are having pain. AFTER USE  Rest and breathe slowly and easily.  It can be helpful to keep track of a log of your progress. Your caregiver can provide you with a simple table to help with this. If you are using the spirometer at home, follow these instructions: [place] IF:   You are having difficultly using the spirometer.  You have trouble using the spirometer as often as instructed.  Your pain medication is not giving enough relief while using the spirometer.  You develop fever of 100.5 F (38.1 C) or higher. SEEK IMMEDIATE MEDICAL CARE IF:   You cough up bloody sputum that had not been present before.  You develop fever of 102 F (38.9 C) or greater.  You develop worsening pain at or  near the incision site. MAKE SURE YOU:   Understand these instructions.  Will watch your condition.  Will get help right away if you are not doing well or get worse. Document Released: 01/03/2007 Document Revised: 11/15/2011 Document Reviewed: 03/06/2007 [employer] Patient Information 2014 [employer], [place].   ________________________________________________________________________  WHAT IS A BLOOD TRANSFUSION? Blood Transfusion Information  A transfusion is the replacement of blood or some of its parts. Blood is made up of multiple cells which provide different functions.  Red blood cells carry oxygen and are used for blood loss replacement.  White blood cells fight against infection.  Platelets control bleeding.  Plasma helps clot blood.  Other blood products are available for specialized needs, such as hemophilia or other clotting disorders. BEFORE THE TRANSFUSION  Who gives blood for transfusions?   Healthy volunteers who are fully evaluated to make sure their blood is safe. This is blood  bank blood. Transfusion therapy is the safest it has ever been in the practice of medicine. Before blood is taken from a donor, a complete history is taken to make sure that person has no history of diseases nor engages in risky social behavior (examples are intravenous drug use or sexual activity with multiple partners). The donor's travel history is screened to minimize risk of transmitting infections, such as malaria. The donated blood is tested for signs of infectious diseases, such as HIV and hepatitis. The blood is then tested to be sure it is compatible with you in order to minimize the chance of a transfusion reaction. If you or a relative donates blood, this is often done in anticipation of surgery and is not appropriate for emergency situations. It takes many days to process the donated blood. RISKS AND COMPLICATIONS Although transfusion therapy is very safe and saves many lives, the main dangers of transfusion include:   Getting an infectious disease.  Developing a transfusion reaction. This is an allergic reaction to something in the blood you were given. Every precaution is taken to prevent this. The decision to have a blood transfusion has been considered carefully by your caregiver before blood is given. Blood is not given unless the benefits outweigh the risks. AFTER THE TRANSFUSION  Right after receiving a blood transfusion, you will usually feel much better and more energetic. This is especially true if your red blood cells have gotten low (anemic). The transfusion raises the level of the red blood cells which carry oxygen, and this usually causes an energy increase.  The nurse administering the transfusion will monitor you carefully for complications. HOME CARE INSTRUCTIONS  No special instructions are needed after a transfusion. You may find your energy is better. Speak with your caregiver about any limitations on activity for underlying diseases you may have. SEEK MEDICAL CARE  IF:   Your condition is not improving after your transfusion.  You develop redness or irritation at the intravenous (IV) site. SEEK IMMEDIATE MEDICAL CARE IF:  Any of the following symptoms occur over the next 12 hours:  Shaking chills.  You have a temperature by mouth above 102 F (38.9 C), not controlled by medicine.  Chest, back, or muscle pain.  People around you feel you are not acting correctly or are confused.  Shortness of breath or difficulty breathing.  Dizziness and fainting.  You get a rash or develop hives.  You have a decrease in urine output.  Your urine turns a dark color or changes to pink, red, or brown. Any of the following symptoms occur over  the next 10 days:  You have a temperature by mouth above 102 F (38.9 C), not controlled by medicine.  Shortness of breath.  Weakness after normal activity.  The white part of the eye turns yellow (jaundice).  You have a decrease in the amount of urine or are urinating less often.  Your urine turns a dark color or changes to pink, red, or brown. Document Released: 08/20/2000 Document Revised: 11/15/2011 Document Reviewed: 04/08/2008 [facility] Patient Information 2014 [place], [place].  _______________________________________________________________________

## 2018-03-13 ENCOUNTER — Encounter (HOSPITAL_COMMUNITY): Payer: Self-pay

## 2018-03-13 ENCOUNTER — Other Ambulatory Visit: Payer: Self-pay

## 2018-03-13 ENCOUNTER — Encounter (HOSPITAL_COMMUNITY)
Admission: RE | Admit: 2018-03-13 | Discharge: 2018-03-13 | Disposition: A | Discharge status: Home / Self Care | Payer: BC Managed Care – PPO | Source: Ambulatory Visit | Attending: Orthopedic Surgery | Admitting: Orthopedic Surgery

## 2018-03-13 DIAGNOSIS — Z01812 Encounter for preprocedural laboratory examination: Secondary | ICD-10-CM | POA: Insufficient documentation

## 2018-03-13 DIAGNOSIS — Z01810 Encounter for preprocedural cardiovascular examination: Secondary | ICD-10-CM | POA: Diagnosis present

## 2018-03-13 DIAGNOSIS — M1612 Unilateral primary osteoarthritis, left hip: Secondary | ICD-10-CM | POA: Insufficient documentation

## 2018-03-13 LAB — COMPREHENSIVE METABOLIC PANEL
ALBUMIN: 4 g/dL (ref 3.5–5.0)
ALT: 20 U/L (ref 0–44)
AST: 21 U/L (ref 15–41)
Alkaline Phosphatase: 93 U/L (ref 38–126)
Anion gap: 8 (ref 5–15)
BUN: 15 mg/dL (ref 8–23)
CHLORIDE: 105 mmol/L (ref 98–111)
CO2: 28 mmol/L (ref 22–32)
CREATININE: 0.69 mg/dL (ref 0.44–1.00)
Calcium: 9 mg/dL (ref 8.9–10.3)
Glucose, Bld: 126 mg/dL — ABNORMAL HIGH (ref 70–99)
POTASSIUM: 4.2 mmol/L (ref 3.5–5.1)
SODIUM: 141 mmol/L (ref 135–145)
TOTAL PROTEIN: 7.1 g/dL (ref 6.5–8.1)
Total Bilirubin: 0.3 mg/dL (ref 0.3–1.2)

## 2018-03-13 LAB — CBC
HCT: 37.5 % (ref 36.0–46.0)
Hemoglobin: 12.4 g/dL (ref 12.0–15.0)
MCH: 30.9 pg (ref 26.0–34.0)
MCHC: 33.1 g/dL (ref 30.0–36.0)
MCV: 93.5 fL (ref 78.0–100.0)
PLATELETS: 341 10*3/uL (ref 150–400)
RBC: 4.01 MIL/uL (ref 3.87–5.11)
RDW: 13.2 % (ref 11.5–15.5)
WBC: 5.4 10*3/uL (ref 4.0–10.5)

## 2018-03-13 LAB — SURGICAL PCR SCREEN
MRSA, PCR: NEGATIVE
Staphylococcus aureus: NEGATIVE

## 2018-03-13 LAB — URINALYSIS, ROUTINE W REFLEX MICROSCOPIC
Bilirubin Urine: NEGATIVE
GLUCOSE, UA: NEGATIVE mg/dL
KETONES UR: NEGATIVE mg/dL
LEUKOCYTES UA: NEGATIVE
NITRITE: NEGATIVE
PROTEIN: NEGATIVE mg/dL
Specific Gravity, Urine: 1.023 (ref 1.005–1.030)
pH: 5 (ref 5.0–8.0)

## 2018-03-13 LAB — PROTIME-INR
INR: 0.99
PROTHROMBIN TIME: 13 s (ref 11.4–15.2)

## 2018-03-13 LAB — APTT: aPTT: 32 seconds (ref 24–36)

## 2018-03-13 LAB — GLUCOSE, CAPILLARY: Glucose-Capillary: 132 mg/dL — ABNORMAL HIGH (ref 70–99)

## 2018-03-13 NOTE — Progress Notes (Signed)
03-13-18 UA routed to Dr. Wynelle Link for review

## 2018-03-14 NOTE — Progress Notes (Signed)
Awaiting Final EKG. Chart given to [place], RN

## 2018-03-18 ENCOUNTER — Other Ambulatory Visit: Payer: Self-pay | Admitting: Family Medicine

## 2018-03-21 MED ORDER — TRANEXAMIC ACID 1000 MG/10ML IV SOLN
1000.0000 mg | INTRAVENOUS | Status: AC
  Administered 2018-03-22: 1000 mg via INTRAVENOUS
  Filled 2018-03-21: qty 1100

## 2018-03-22 ENCOUNTER — Encounter (HOSPITAL_COMMUNITY)
Admission: RE | Disposition: A | Discharge status: Home Health | Payer: Self-pay | Source: Other Acute Inpatient Hospital | Attending: Orthopedic Surgery

## 2018-03-22 ENCOUNTER — Encounter (HOSPITAL_COMMUNITY): Payer: Self-pay | Admitting: Certified Registered Nurse Anesthetist

## 2018-03-22 ENCOUNTER — Inpatient Hospital Stay (HOSPITAL_COMMUNITY): Payer: BC Managed Care – PPO | Admitting: Certified Registered Nurse Anesthetist

## 2018-03-22 ENCOUNTER — Other Ambulatory Visit: Payer: Self-pay

## 2018-03-22 ENCOUNTER — Inpatient Hospital Stay (HOSPITAL_COMMUNITY): Payer: BC Managed Care – PPO

## 2018-03-22 ENCOUNTER — Inpatient Hospital Stay (HOSPITAL_COMMUNITY)
Admission: RE | Admit: 2018-03-22 | Discharge: 2018-03-23 | DRG: 470 | Disposition: A | Discharge status: Home Health | Payer: BC Managed Care – PPO | Source: Other Acute Inpatient Hospital | Attending: Orthopedic Surgery | Admitting: Orthopedic Surgery

## 2018-03-22 DIAGNOSIS — E119 Type 2 diabetes mellitus without complications: Secondary | ICD-10-CM | POA: Diagnosis present

## 2018-03-22 DIAGNOSIS — I10 Essential (primary) hypertension: Secondary | ICD-10-CM | POA: Diagnosis present

## 2018-03-22 DIAGNOSIS — M25552 Pain in left hip: Secondary | ICD-10-CM | POA: Diagnosis present

## 2018-03-22 DIAGNOSIS — Z833 Family history of diabetes mellitus: Secondary | ICD-10-CM | POA: Diagnosis not present

## 2018-03-22 DIAGNOSIS — J45909 Unspecified asthma, uncomplicated: Secondary | ICD-10-CM | POA: Diagnosis present

## 2018-03-22 DIAGNOSIS — Z9013 Acquired absence of bilateral breasts and nipples: Secondary | ICD-10-CM | POA: Diagnosis not present

## 2018-03-22 DIAGNOSIS — Z853 Personal history of malignant neoplasm of breast: Secondary | ICD-10-CM

## 2018-03-22 DIAGNOSIS — Z803 Family history of malignant neoplasm of breast: Secondary | ICD-10-CM

## 2018-03-22 DIAGNOSIS — M1611 Unilateral primary osteoarthritis, right hip: Secondary | ICD-10-CM

## 2018-03-22 DIAGNOSIS — Z96641 Presence of right artificial hip joint: Secondary | ICD-10-CM | POA: Diagnosis present

## 2018-03-22 DIAGNOSIS — K219 Gastro-esophageal reflux disease without esophagitis: Secondary | ICD-10-CM | POA: Diagnosis present

## 2018-03-22 DIAGNOSIS — Z7984 Long term (current) use of oral hypoglycemic drugs: Secondary | ICD-10-CM

## 2018-03-22 DIAGNOSIS — Z96649 Presence of unspecified artificial hip joint: Secondary | ICD-10-CM

## 2018-03-22 DIAGNOSIS — M1612 Unilateral primary osteoarthritis, left hip: Secondary | ICD-10-CM | POA: Diagnosis present

## 2018-03-22 DIAGNOSIS — M169 Osteoarthritis of hip, unspecified: Secondary | ICD-10-CM | POA: Diagnosis present

## 2018-03-22 HISTORY — PX: TOTAL HIP ARTHROPLASTY: SHX124

## 2018-03-22 LAB — GLUCOSE, CAPILLARY
Glucose-Capillary: 113 mg/dL — ABNORMAL HIGH (ref 70–99)
Glucose-Capillary: 143 mg/dL — ABNORMAL HIGH (ref 70–99)
Glucose-Capillary: 203 mg/dL — ABNORMAL HIGH (ref 70–99)

## 2018-03-22 LAB — TYPE AND SCREEN
ABO/RH(D): O POS
ANTIBODY SCREEN: NEGATIVE

## 2018-03-22 SURGERY — ARTHROPLASTY, HIP, TOTAL, ANTERIOR APPROACH
Anesthesia: Spinal | Site: Hip | Laterality: Left

## 2018-03-22 MED ORDER — HYDROMORPHONE HCL 2 MG PO TABS
4.0000 mg | ORAL_TABLET | ORAL | Status: DC | PRN
  Administered 2018-03-23: 4 mg via ORAL
  Filled 2018-03-22: qty 2

## 2018-03-22 MED ORDER — BUPIVACAINE-EPINEPHRINE (PF) 0.25% -1:200000 IJ SOLN
INTRAMUSCULAR | Status: AC
  Filled 2018-03-22: qty 30

## 2018-03-22 MED ORDER — ALBUTEROL SULFATE (2.5 MG/3ML) 0.083% IN NEBU: 2.5000 mg | INHALATION_SOLUTION | Freq: Four times a day (QID) | RESPIRATORY_TRACT | Status: DC | PRN

## 2018-03-22 MED ORDER — PROPOFOL 500 MG/50ML IV EMUL
INTRAVENOUS | Status: DC | PRN
  Administered 2018-03-22: 100 ug/kg/min via INTRAVENOUS

## 2018-03-22 MED ORDER — DOCUSATE SODIUM 100 MG PO CAPS
100.0000 mg | ORAL_CAPSULE | Freq: Two times a day (BID) | ORAL | Status: DC
  Administered 2018-03-22 – 2018-03-23 (×2): 100 mg via ORAL
  Filled 2018-03-22 (×2): qty 1

## 2018-03-22 MED ORDER — PHENYLEPHRINE 40 MCG/ML (10ML) SYRINGE FOR IV PUSH (FOR BLOOD PRESSURE SUPPORT)
PREFILLED_SYRINGE | INTRAVENOUS | Status: DC | PRN
Start: 2018-03-22 — End: 2018-03-22
  Administered 2018-03-22 (×6): 80 ug via INTRAVENOUS
  Administered 2018-03-22: 40 ug via INTRAVENOUS
  Administered 2018-03-22 (×4): 80 ug via INTRAVENOUS

## 2018-03-22 MED ORDER — ONDANSETRON HCL 4 MG/2ML IJ SOLN
INTRAMUSCULAR | Status: DC | PRN
Start: 2018-03-22 — End: 2018-03-22
  Administered 2018-03-22: 4 mg via INTRAVENOUS

## 2018-03-22 MED ORDER — DEXAMETHASONE SODIUM PHOSPHATE 10 MG/ML IJ SOLN
INTRAMUSCULAR | Status: AC
  Filled 2018-03-22: qty 1

## 2018-03-22 MED ORDER — FLEET ENEMA 7-19 GM/118ML RE ENEM: 1.0000 | ENEMA | Freq: Once | RECTAL | Status: DC | PRN

## 2018-03-22 MED ORDER — HYDROMORPHONE HCL 1 MG/ML IJ SOLN
0.2500 mg | INTRAMUSCULAR | Status: DC | PRN
  Administered 2018-03-22 (×2): 0.5 mg via INTRAVENOUS

## 2018-03-22 MED ORDER — MORPHINE SULFATE (PF) 2 MG/ML IV SOLN: 0.5000 mg | INTRAVENOUS | Status: DC | PRN

## 2018-03-22 MED ORDER — STERILE WATER FOR IRRIGATION IR SOLN
Status: DC | PRN
  Administered 2018-03-22: 2000 mL

## 2018-03-22 MED ORDER — CEFAZOLIN SODIUM-DEXTROSE 2-4 GM/100ML-% IV SOLN
2.0000 g | Freq: Four times a day (QID) | INTRAVENOUS | Status: AC
  Administered 2018-03-22 – 2018-03-23 (×2): 2 g via INTRAVENOUS
  Filled 2018-03-22 (×2): qty 100

## 2018-03-22 MED ORDER — METHOCARBAMOL 500 MG PO TABS
500.0000 mg | ORAL_TABLET | Freq: Four times a day (QID) | ORAL | Status: DC | PRN
  Administered 2018-03-23: 500 mg via ORAL
  Filled 2018-03-22: qty 1

## 2018-03-22 MED ORDER — LACTATED RINGERS IV SOLN
INTRAVENOUS | Status: DC
  Administered 2018-03-22 (×2): via INTRAVENOUS

## 2018-03-22 MED ORDER — CEFAZOLIN SODIUM-DEXTROSE 2-4 GM/100ML-% IV SOLN
2.0000 g | INTRAVENOUS | Status: AC
  Administered 2018-03-22: 2 g via INTRAVENOUS
  Filled 2018-03-22: qty 100

## 2018-03-22 MED ORDER — BISACODYL 10 MG RE SUPP: 10.0000 mg | Freq: Every day | RECTAL | Status: DC | PRN

## 2018-03-22 MED ORDER — EPHEDRINE SULFATE-NACL 50-0.9 MG/10ML-% IV SOSY
PREFILLED_SYRINGE | INTRAVENOUS | Status: DC | PRN
  Administered 2018-03-22 (×2): 10 mg via INTRAVENOUS

## 2018-03-22 MED ORDER — EXEMESTANE 25 MG PO TABS
25.0000 mg | ORAL_TABLET | Freq: Every day | ORAL | Status: DC
  Administered 2018-03-23: 25 mg via ORAL
  Filled 2018-03-22: qty 1

## 2018-03-22 MED ORDER — ACETAMINOPHEN 500 MG PO TABS
500.0000 mg | ORAL_TABLET | Freq: Four times a day (QID) | ORAL | Status: DC
  Administered 2018-03-22 – 2018-03-23 (×3): 500 mg via ORAL
  Filled 2018-03-22 (×3): qty 1

## 2018-03-22 MED ORDER — SODIUM CHLORIDE 0.9 % IV SOLN
INTRAVENOUS | Status: DC
  Administered 2018-03-22 – 2018-03-23 (×2): via INTRAVENOUS

## 2018-03-22 MED ORDER — BUPIVACAINE-EPINEPHRINE (PF) 0.25% -1:200000 IJ SOLN
INTRAMUSCULAR | Status: DC | PRN
  Administered 2018-03-22: 30 mL

## 2018-03-22 MED ORDER — PROPOFOL 10 MG/ML IV BOLUS
INTRAVENOUS | Status: AC
  Filled 2018-03-22: qty 60

## 2018-03-22 MED ORDER — PROPOFOL 10 MG/ML IV BOLUS
INTRAVENOUS | Status: DC | PRN
  Administered 2018-03-22 (×2): 20 mg via INTRAVENOUS

## 2018-03-22 MED ORDER — HYDROMORPHONE HCL 1 MG/ML IJ SOLN
INTRAMUSCULAR | Status: AC
Start: 2018-03-22 — End: 2018-03-23
  Filled 2018-03-22: qty 1

## 2018-03-22 MED ORDER — DEXAMETHASONE SODIUM PHOSPHATE 10 MG/ML IJ SOLN
8.0000 mg | Freq: Once | INTRAMUSCULAR | Status: AC
  Administered 2018-03-22: 8 mg via INTRAVENOUS

## 2018-03-22 MED ORDER — HYDROMORPHONE HCL 2 MG PO TABS
2.0000 mg | ORAL_TABLET | ORAL | Status: DC | PRN
  Administered 2018-03-22 – 2018-03-23 (×2): 2 mg via ORAL
  Filled 2018-03-22 (×2): qty 1

## 2018-03-22 MED ORDER — FENTANYL CITRATE (PF) 100 MCG/2ML IJ SOLN
INTRAMUSCULAR | Status: AC
  Filled 2018-03-22: qty 2

## 2018-03-22 MED ORDER — PHENOL 1.4 % MT LIQD: 1.0000 | OROMUCOSAL | Status: DC | PRN

## 2018-03-22 MED ORDER — MIDAZOLAM HCL 5 MG/5ML IJ SOLN
INTRAMUSCULAR | Status: DC | PRN
  Administered 2018-03-22: 2 mg via INTRAVENOUS

## 2018-03-22 MED ORDER — RIVAROXABAN 10 MG PO TABS
10.0000 mg | ORAL_TABLET | Freq: Every day | ORAL | Status: DC
  Administered 2018-03-23: 10 mg via ORAL
  Filled 2018-03-22: qty 1

## 2018-03-22 MED ORDER — FENTANYL CITRATE (PF) 100 MCG/2ML IJ SOLN
INTRAMUSCULAR | Status: DC | PRN
  Administered 2018-03-22 (×2): 50 ug via INTRAVENOUS

## 2018-03-22 MED ORDER — METHOCARBAMOL 1000 MG/10ML IJ SOLN
500.0000 mg | Freq: Four times a day (QID) | INTRAVENOUS | Status: DC | PRN
  Administered 2018-03-22: 500 mg via INTRAVENOUS
  Filled 2018-03-22: qty 550

## 2018-03-22 MED ORDER — METOCLOPRAMIDE HCL 5 MG PO TABS: 5.0000 mg | ORAL_TABLET | Freq: Three times a day (TID) | ORAL | Status: DC | PRN

## 2018-03-22 MED ORDER — DEXAMETHASONE SODIUM PHOSPHATE 10 MG/ML IJ SOLN: 10.0000 mg | Freq: Once | INTRAMUSCULAR | Status: DC

## 2018-03-22 MED ORDER — MEPERIDINE HCL 50 MG/ML IJ SOLN: 6.2500 mg | INTRAMUSCULAR | Status: DC | PRN

## 2018-03-22 MED ORDER — DIPHENHYDRAMINE HCL 12.5 MG/5ML PO ELIX: 12.5000 mg | ORAL_SOLUTION | ORAL | Status: DC | PRN

## 2018-03-22 MED ORDER — LORATADINE 10 MG PO TABS
10.0000 mg | ORAL_TABLET | Freq: Every day | ORAL | Status: DC
  Administered 2018-03-22: 10 mg via ORAL
  Filled 2018-03-22: qty 1

## 2018-03-22 MED ORDER — LIDOCAINE 2% (20 MG/ML) 5 ML SYRINGE
INTRAMUSCULAR | Status: AC
  Filled 2018-03-22: qty 5

## 2018-03-22 MED ORDER — MIDAZOLAM HCL 2 MG/2ML IJ SOLN
INTRAMUSCULAR | Status: AC
  Filled 2018-03-22: qty 2

## 2018-03-22 MED ORDER — PHENYLEPHRINE 40 MCG/ML (10ML) SYRINGE FOR IV PUSH (FOR BLOOD PRESSURE SUPPORT)
PREFILLED_SYRINGE | INTRAVENOUS | Status: AC
  Filled 2018-03-22: qty 10

## 2018-03-22 MED ORDER — POLYETHYLENE GLYCOL 3350 17 G PO PACK: 17.0000 g | PACK | Freq: Every day | ORAL | Status: DC | PRN

## 2018-03-22 MED ORDER — METOCLOPRAMIDE HCL 5 MG/ML IJ SOLN: 5.0000 mg | Freq: Three times a day (TID) | INTRAMUSCULAR | Status: DC | PRN

## 2018-03-22 MED ORDER — ACETAMINOPHEN 10 MG/ML IV SOLN
1000.0000 mg | Freq: Once | INTRAVENOUS | Status: AC
  Administered 2018-03-22: 1000 mg via INTRAVENOUS
  Filled 2018-03-22: qty 100

## 2018-03-22 MED ORDER — ONDANSETRON HCL 4 MG/2ML IJ SOLN: 4.0000 mg | Freq: Four times a day (QID) | INTRAMUSCULAR | Status: DC | PRN

## 2018-03-22 MED ORDER — EPHEDRINE 5 MG/ML INJ
INTRAVENOUS | Status: AC
  Filled 2018-03-22: qty 10

## 2018-03-22 MED ORDER — ESCITALOPRAM OXALATE 20 MG PO TABS
20.0000 mg | ORAL_TABLET | Freq: Every day | ORAL | Status: DC
  Administered 2018-03-23: 20 mg via ORAL
  Filled 2018-03-22: qty 1

## 2018-03-22 MED ORDER — ONDANSETRON HCL 4 MG/2ML IJ SOLN
INTRAMUSCULAR | Status: AC
  Filled 2018-03-22: qty 2

## 2018-03-22 MED ORDER — CHLORHEXIDINE GLUCONATE 4 % EX LIQD: 60.0000 mL | Freq: Once | CUTANEOUS | Status: DC

## 2018-03-22 MED ORDER — ONDANSETRON HCL 4 MG PO TABS
4.0000 mg | ORAL_TABLET | Freq: Four times a day (QID) | ORAL | Status: DC | PRN
Start: 2018-03-22 — End: 2018-03-23

## 2018-03-22 MED ORDER — 0.9 % SODIUM CHLORIDE (POUR BTL) OPTIME
TOPICAL | Status: DC | PRN
  Administered 2018-03-22: 1000 mL

## 2018-03-22 MED ORDER — MENTHOL 3 MG MT LOZG: 1.0000 | LOZENGE | OROMUCOSAL | Status: DC | PRN

## 2018-03-22 MED ORDER — PANTOPRAZOLE SODIUM 40 MG PO TBEC
40.0000 mg | DELAYED_RELEASE_TABLET | Freq: Every day | ORAL | Status: DC
  Administered 2018-03-23: 40 mg via ORAL
  Filled 2018-03-22: qty 1

## 2018-03-22 MED ORDER — INSULIN ASPART 100 UNIT/ML ~~LOC~~ SOLN
0.0000 [IU] | Freq: Three times a day (TID) | SUBCUTANEOUS | Status: DC
  Administered 2018-03-22: 5 [IU] via SUBCUTANEOUS
  Administered 2018-03-23: 2 [IU] via SUBCUTANEOUS

## 2018-03-22 MED ORDER — LIDOCAINE HCL (CARDIAC) PF 100 MG/5ML IV SOSY
PREFILLED_SYRINGE | INTRAVENOUS | Status: DC | PRN
  Administered 2018-03-22: 60 mg via INTRAVENOUS

## 2018-03-22 MED ORDER — PROMETHAZINE HCL 25 MG/ML IJ SOLN: 6.2500 mg | INTRAMUSCULAR | Status: DC | PRN

## 2018-03-22 SURGICAL SUPPLY — 37 items
BAG DECANTER FOR FLEXI CONT (MISCELLANEOUS) ×2 IMPLANT
BAG SPEC THK2 15X12 ZIP CLS (MISCELLANEOUS)
BAG ZIPLOCK 12X15 (MISCELLANEOUS) IMPLANT
BLADE SAG 18X100X1.27 (BLADE) ×2 IMPLANT
CAPT HIP TOTAL 2 ×1 IMPLANT
COVER PERINEAL POST (MISCELLANEOUS) ×2 IMPLANT
COVER SURGICAL LIGHT HANDLE (MISCELLANEOUS) ×2 IMPLANT
CUP ACETBLR 52 OD PINNACLE (Hips) ×1 IMPLANT
DECANTER SPIKE VIAL GLASS SM (MISCELLANEOUS) ×2 IMPLANT
DRAPE STERI IOBAN 125X83 (DRAPES) ×2 IMPLANT
DRAPE U-SHAPE 47X51 STRL (DRAPES) ×4 IMPLANT
DRSG ADAPTIC 3X8 NADH LF (GAUZE/BANDAGES/DRESSINGS) ×2 IMPLANT
DRSG MEPILEX BORDER 4X4 (GAUZE/BANDAGES/DRESSINGS) ×2 IMPLANT
DRSG MEPILEX BORDER 4X8 (GAUZE/BANDAGES/DRESSINGS) ×2 IMPLANT
DURAPREP 26ML APPLICATOR (WOUND CARE) ×2 IMPLANT
ELECT REM PT RETURN 15FT ADLT (MISCELLANEOUS) ×2 IMPLANT
EVACUATOR 1/8 PVC DRAIN (DRAIN) ×2 IMPLANT
GLOVE BIO SURGEON STRL SZ8 (GLOVE) ×2 IMPLANT
GLOVE BIOGEL PI IND STRL 7.5 (GLOVE) IMPLANT
GLOVE BIOGEL PI IND STRL 8 (GLOVE) ×1 IMPLANT
GLOVE BIOGEL PI INDICATOR 7.5 (GLOVE) ×1
GLOVE BIOGEL PI INDICATOR 8 (GLOVE) ×2
GLOVE ECLIPSE 8.0 STRL XLNG CF (GLOVE) ×1 IMPLANT
GOWN STRL REUS W/TWL LRG LVL3 (GOWN DISPOSABLE) ×2 IMPLANT
GOWN STRL REUS W/TWL XL LVL3 (GOWN DISPOSABLE) ×2 IMPLANT
PACK ANTERIOR HIP CUSTOM (KITS) ×2 IMPLANT
STRIP CLOSURE SKIN 1/2X4 (GAUZE/BANDAGES/DRESSINGS) ×2 IMPLANT
STRIP CLOSURE SKIN 1/4X4 (GAUZE/BANDAGES/DRESSINGS) ×1 IMPLANT
SUT ETHIBOND NAB CT1 #1 30IN (SUTURE) ×2 IMPLANT
SUT MNCRL AB 4-0 PS2 18 (SUTURE) ×2 IMPLANT
SUT STRATAFIX 0 PDS 27 VIOLET (SUTURE) ×2
SUT VIC AB 2-0 CT1 27 (SUTURE) ×4
SUT VIC AB 2-0 CT1 TAPERPNT 27 (SUTURE) ×2 IMPLANT
SUTURE STRATFX 0 PDS 27 VIOLET (SUTURE) ×1 IMPLANT
SYR 50ML LL SCALE MARK (SYRINGE) IMPLANT
TRAY FOLEY MTR SLVR 16FR STAT (SET/KITS/TRAYS/PACK) ×2 IMPLANT
YANKAUER SUCT BULB TIP 10FT TU (MISCELLANEOUS) ×2 IMPLANT

## 2018-03-22 NOTE — Interval H&P Note (Signed)
History and Physical Interval Note:  03/22/2018 11:04 AM  Carolyn Glass  has presented today for surgery, with the diagnosis of Left hip osteoarthritis  The various methods of treatment have been discussed with the patient and family. After consideration of risks, benefits and other options for treatment, the patient has consented to  Procedure(s): LEFT TOTAL HIP ARTHROPLASTY ANTERIOR APPROACH (Left) as a surgical intervention .  The patient's history has been reviewed, patient examined, no change in status, stable for surgery.  I have reviewed the patient's chart and labs.  Questions were answered to the patient's satisfaction.     Pilar Plate Jenise Iannelli

## 2018-03-22 NOTE — Anesthesia Preprocedure Evaluation (Addendum)
Anesthesia Evaluation  Patient identified by MRN, date of birth, ID band Patient awake    Reviewed: Allergy & Precautions, NPO status , Patient's Chart, lab work & pertinent test results  Airway Mallampati: II  TM Distance: >3 FB Neck ROM: Full    Dental  (+) Teeth Intact, Dental Advisory Given   Pulmonary asthma ,    breath sounds clear to auscultation       Cardiovascular hypertension, Pt. on medications  Rhythm:Regular Rate:Normal     Neuro/Psych PSYCHIATRIC DISORDERS Anxiety Depression    GI/Hepatic GERD  ,  Endo/Other  diabetes, Type 2  Renal/GU      Musculoskeletal  (+) Arthritis ,   Abdominal   Peds  Hematology   Anesthesia Other Findings   Reproductive/Obstetrics                             Anesthesia Physical  Anesthesia Plan  ASA: III  Anesthesia Plan: Spinal   Post-op Pain Management:    Induction: Intravenous  PONV Risk Score and Plan: Ondansetron, TIVA, Treatment may vary due to age or medical condition and Propofol infusion  Airway Management Planned:   Additional Equipment:   Intra-op Plan:   Post-operative Plan: Extubation in OR  Informed Consent: I have reviewed the patients History and Physical, chart, labs and discussed the procedure including the risks, benefits and alternatives for the proposed anesthesia with the patient or authorized representative who has indicated his/her understanding and acceptance.   Dental advisory given  Plan Discussed with: CRNA  Anesthesia Plan Comments:         Anesthesia Quick Evaluation

## 2018-03-22 NOTE — Discharge Instructions (Addendum)
Information on my medicine - XARELTO (Rivaroxaban)  Why was Xarelto prescribed for you? Xarelto was prescribed for you to reduce the risk of blood clots forming after orthopedic surgery. The medical term for these abnormal blood clots is venous thromboembolism (VTE).  What do you need to know about xarelto ? Take your Xarelto ONCE DAILY at the same time every day. You may take it either with or without food.  If you have difficulty swallowing the tablet whole, you may crush it and mix in applesauce just prior to taking your dose.  Take Xarelto exactly as prescribed by your doctor and DO NOT stop taking Xarelto without talking to the doctor who prescribed the medication.  Stopping without other VTE prevention medication to take the place of Xarelto may increase your risk of developing a clot.  After discharge, you should have regular check-up appointments with your healthcare provider that is prescribing your Xarelto.    What do you do if you miss a dose? If you miss a dose, take it as soon as you remember on the same day then continue your regularly scheduled once daily regimen the next day. Do not take two doses of Xarelto on the same day.   Important Safety Information A possible side effect of Xarelto is bleeding. You should call your healthcare provider right away if you experience any of the following: ? Bleeding from an injury or your nose that does not stop. ? Unusual colored urine (red or dark brown) or unusual colored stools (red or black). ? Unusual bruising for unknown reasons. ? A serious fall or if you hit your head (even if there is no bleeding).  Some medicines may interact with Xarelto and might increase your risk of bleeding while on Xarelto. To help avoid this, consult your healthcare provider or pharmacist prior to using any new prescription or non-prescription medications, including herbals, vitamins, non-steroidal anti-inflammatory drugs (NSAIDs) and  supplements.  This website has more information on Xarelto: [email or web address].[email or web address]     Dr. Gaynelle Arabian Total Joint Specialist [facility] [address] [place], [place] [address] [phone]  ANTERIOR APPROACH TOTAL HIP REPLACEMENT POSTOPERATIVE DIRECTIONS   Hip Rehabilitation, Guidelines Following Surgery  The results of a hip operation are greatly improved after range of motion and muscle strengthening exercises. Follow all safety measures which are given to protect your hip. If any of these exercises cause increased pain or swelling in your joint, decrease the amount until you are comfortable again. Then slowly increase the exercises. Call your caregiver if you have problems or questions.   HOME CARE INSTRUCTIONS  Remove items at home which could result in a fall. This includes throw rugs or furniture in walking pathways.   ICE to the affected hip every three hours for 30 minutes at a time and then as needed for pain and swelling.  Continue to use ice on the hip for pain and swelling from surgery. You may notice swelling that will progress down to the foot and ankle.  This is normal after surgery.  Elevate the leg when you are not up walking on it.    Continue to use the breathing machine which will help keep your temperature down.  It is common for your temperature to cycle up and down following surgery, especially at night when you are not up moving around and exerting yourself.  The breathing machine keeps your lungs expanded and your temperature down.   DIET You may resume your previous home diet  once your are discharged from the hospital.  DRESSING / WOUND CARE / SHOWERING You may change your dressing every day with sterile gauze.  Please use good hand washing techniques before changing the dressing.  Do not use any lotions or creams on the incision until instructed by your surgeon. You may start showering once you are discharged home but do not submerge the  incision under water. Just pat the incision dry and apply a dry gauze dressing on daily. Change the surgical dressing daily and reapply a dry dressing each time.  ACTIVITY Walk with your walker as instructed. Use walker as long as suggested by your caregivers. Avoid periods of inactivity such as sitting longer than an hour when not asleep. This helps prevent blood clots.  You may resume a sexual relationship in one month or when given the OK by your doctor.  You may return to work once you are cleared by your doctor.  Do not drive a car for 6 weeks or until released by you surgeon.  Do not drive while taking narcotics.  WEIGHT BEARING Weight bearing as tolerated with assist device (walker, cane, etc) as directed, use it as long as suggested by your surgeon or therapist, typically at least 4-6 weeks.  POSTOPERATIVE CONSTIPATION PROTOCOL Constipation - defined medically as fewer than three stools per week and severe constipation as less than one stool per week.  One of the most common issues patients have following surgery is constipation.  Even if you have a regular bowel pattern at home, your normal regimen is likely to be disrupted due to multiple reasons following surgery.  Combination of anesthesia, postoperative narcotics, change in appetite and fluid intake all can affect your bowels.  In order to avoid complications following surgery, here are some recommendations in order to help you during your recovery period.  Colace (docusate) - Pick up an over-the-counter form of Colace or another stool softener and take twice a day as long as you are requiring postoperative pain medications.  Take with a full glass of water daily.  If you experience loose stools or diarrhea, hold the colace until you stool forms back up.  If your symptoms do not get better within 1 week or if they get worse, check with your doctor.  Dulcolax (bisacodyl) - Pick up over-the-counter and take as directed by the product  packaging as needed to assist with the movement of your bowels.  Take with a full glass of water.  Use this product as needed if not relieved by Colace only.   MiraLax (polyethylene glycol) - Pick up over-the-counter to have on hand.  MiraLax is a solution that will increase the amount of water in your bowels to assist with bowel movements.  Take as directed and can mix with a glass of water, juice, soda, coffee, or tea.  Take if you go more than two days without a movement. Do not use MiraLax more than once per day. Call your doctor if you are still constipated or irregular after using this medication for 7 days in a row.  If you continue to have problems with postoperative constipation, please contact the office for further assistance and recommendations.  If you experience "the worst abdominal pain ever" or develop nausea or vomiting, please contact the office immediatly for further recommendations for treatment.  ITCHING  If you experience itching with your medications, try taking only a single pain pill, or even half a pain pill at a time.  You can also  use Benadryl over the counter for itching or also to help with sleep.   TED HOSE STOCKINGS Wear the elastic stockings on both legs for three weeks following surgery during the day but you may remove then at night for sleeping.  MEDICATIONS See your medication summary on the After Visit Summary that the nursing staff will review with you prior to discharge.  You may have some home medications which will be placed on hold until you complete the course of blood thinner medication.  It is important for you to complete the blood thinner medication as prescribed by your surgeon.  Continue your approved medications as instructed at time of discharge.  PRECAUTIONS If you experience chest pain or shortness of breath - call 911 immediately for transfer to the hospital emergency department.  If you develop a fever greater that 101 F, purulent drainage  from wound, increased redness or drainage from wound, foul odor from the wound/dressing, or calf pain - CONTACT YOUR SURGEON.                                                   FOLLOW-UP APPOINTMENTS Make sure you keep all of your appointments after your operation with your surgeon and caregivers. You should call the office at the above phone number and make an appointment for approximately two weeks after the date of your surgery or on the date instructed by your surgeon outlined in the "After Visit Summary".  RANGE OF MOTION AND STRENGTHENING EXERCISES  These exercises are designed to help you keep full movement of your hip joint. Follow your caregiver's or physical therapist's instructions. Perform all exercises about fifteen times, three times per day or as directed. Exercise both hips, even if you have had only one joint replacement. These exercises can be done on a training (exercise) mat, on the floor, on a table or on a bed. Use whatever works the best and is most comfortable for you. Use music or television while you are exercising so that the exercises are a pleasant break in your day. This will make your life better with the exercises acting as a break in routine you can look forward to.  Lying on your back, slowly slide your foot toward your buttocks, raising your knee up off the floor. Then slowly slide your foot back down until your leg is straight again.  Lying on your back spread your legs as far apart as you can without causing discomfort.  Lying on your side, raise your upper leg and foot straight up from the floor as far as is comfortable. Slowly lower the leg and repeat.  Lying on your back, tighten up the muscle in the front of your thigh (quadriceps muscles). You can do this by keeping your leg straight and trying to raise your heel off the floor. This helps strengthen the largest muscle supporting your knee.  Lying on your back, tighten up the muscles of your buttocks both with the  legs straight and with the knee bent at a comfortable angle while keeping your heel on the floor.   IF YOU ARE TRANSFERRED TO A SKILLED REHAB FACILITY If the patient is transferred to a skilled rehab facility following release from the hospital, a list of the current medications will be sent to the facility for the patient to continue.  When discharged from  the skilled rehab facility, please have the facility set up the patient's [place] prior to being released. Also, the skilled facility will be responsible for providing the patient with their medications at time of release from the facility to include their pain medication, the muscle relaxants, and their blood thinner medication. If the patient is still at the rehab facility at time of the two week follow up appointment, the skilled rehab facility will also need to assist the patient in arranging follow up appointment in our office and any transportation needs.  MAKE SURE YOU:  Understand these instructions.  Get help right away if you are not doing well or get worse.    Pick up stool softner and laxative for home use following surgery while on pain medications. Do not submerge incision under water. Please use good hand washing techniques while changing dressing each day. May shower starting three days after surgery. Please use a clean towel to pat the incision dry following showers. Continue to use ice for pain and swelling after surgery. Do not use any lotions or creams on the incision until instructed by your surgeon.

## 2018-03-22 NOTE — Transfer of Care (Signed)
Immediate Anesthesia Transfer of Care Note  Patient: Carolyn Glass  Procedure(s) Performed: LEFT TOTAL HIP ARTHROPLASTY ANTERIOR APPROACH (Left Hip)  Patient Location: PACU  Anesthesia Type:Spinal  Level of Consciousness: awake, alert , oriented and patient cooperative  Airway & Oxygen Therapy: Patient Spontanous Breathing and Patient connected to face mask oxygen  Post-op Assessment: Report given to RN, Post -op Vital signs reviewed and stable and Patient moving all extremities  Post vital signs: Reviewed and stable  Last Vitals:  Vitals Value Taken Time  BP 118/82 03/22/2018  1:53 PM  Temp    Pulse 88 03/22/2018  1:54 PM  Resp 14 03/22/2018  1:54 PM  SpO2 100 % 03/22/2018  1:54 PM  Vitals shown include unvalidated device data.  Last Pain:  Vitals:   03/22/18 1049  TempSrc: Oral  PainSc:       Patients Stated Pain Goal: 4 (34/28/76 8115)  Complications: No apparent anesthesia complications

## 2018-03-22 NOTE — Anesthesia Postprocedure Evaluation (Signed)
Anesthesia Post Note  Patient: Carolyn Glass  Procedure(s) Performed: LEFT TOTAL HIP ARTHROPLASTY ANTERIOR APPROACH (Left Hip)     Patient location during evaluation: PACU Anesthesia Type: Spinal Level of consciousness: awake and alert Pain management: pain level controlled Vital Signs Assessment: post-procedure vital signs reviewed and stable Respiratory status: spontaneous breathing and respiratory function stable Cardiovascular status: blood pressure returned to baseline and stable Postop Assessment: spinal receding Anesthetic complications: no    Last Vitals:  Vitals:   03/22/18 1646 03/22/18 1724  BP: 122/70 113/61  Pulse: 88 82  Resp: 16   Temp: 36.7 C   SpO2: 97% 99%    Last Pain:  Vitals:   03/22/18 1832  TempSrc:   PainSc: 2     LLE Motor Response: Purposeful movement;Responds to commands (03/22/18 1831) LLE Sensation: Full sensation (03/22/18 1831) RLE Motor Response: Purposeful movement;Responds to commands (03/22/18 1831) RLE Sensation: Full sensation (03/22/18 1831) L Sensory Level: S1-Sole of foot, small toes (03/22/18 1831) R Sensory Level: S1-Sole of foot, small toes (03/22/18 1831)  Nolon Nations

## 2018-03-22 NOTE — Anesthesia Procedure Notes (Signed)
Spinal  Patient location during procedure: OR Staffing Anesthesiologist: Nolon Nations, MD Performed: anesthesiologist  Preanesthetic Checklist Completed: patient identified, site marked, surgical consent, pre-op evaluation, timeout performed, IV checked, risks and benefits discussed and monitors and equipment checked Spinal Block Patient position: sitting Prep: site prepped and draped and DuraPrep Patient monitoring: heart rate, continuous pulse ox and blood pressure Location: L2-3 Injection technique: single-shot Needle Needle type: Sprotte  Needle gauge: 24 G Needle length: 9 cm Additional Notes Expiration date of kit checked and confirmed. Patient tolerated procedure well, without complications.

## 2018-03-22 NOTE — Op Note (Signed)
OPERATIVE REPORT- TOTAL HIP ARTHROPLASTY   PREOPERATIVE DIAGNOSIS: Osteoarthritis of the Left hip.   POSTOPERATIVE DIAGNOSIS: Osteoarthritis of the Left  hip.   PROCEDURE: Left total hip arthroplasty, anterior approach.   SURGEON: Gaynelle Arabian, MD   ASSISTANT: Molli Barrows, PA-C  ANESTHESIA:  Spinal  ESTIMATED BLOOD LOSS:-200 mL    DRAINS: Hemovac x1.   COMPLICATIONS: None   CONDITION: PACU - hemodynamically stable.   BRIEF CLINICAL NOTE: Carolyn Glass is a 62 y.o. female who has advanced end-  stage arthritis of their Left  hip with progressively worsening pain and  dysfunction.The patient has failed nonoperative management and presents for  total hip arthroplasty.   PROCEDURE IN DETAIL: After successful administration of spinal  anesthetic, the traction boots for the [facility] bed were placed on both  feet and the patient was placed onto the [facility] bed, boots placed into the leg  holders. The Left hip was then isolated from the perineum with plastic  drapes and prepped and draped in the usual sterile fashion. ASIS and  greater trochanter were marked and a oblique incision was made, starting  at about 1 cm lateral and 2 cm distal to the ASIS and coursing towards  the anterior cortex of the femur. The skin was cut with a 10 blade  through subcutaneous tissue to the level of the fascia overlying the  tensor fascia lata muscle. The fascia was then incised in line with the  incision at the junction of the anterior third and posterior 2/3rd. The  muscle was teased off the fascia and then the interval between the TFL  and the rectus was developed. The Hohmann retractor was then placed at  the top of the femoral neck over the capsule. The vessels overlying the  capsule were cauterized and the fat on top of the capsule was removed.  A Hohmann retractor was then placed anterior underneath the rectus  femoris to give exposure to the entire anterior capsule. A T-shaped   capsulotomy was performed. The edges were tagged and the femoral head  was identified.       Osteophytes are removed off the superior acetabulum.  The femoral neck was then cut in situ with an oscillating saw. Traction  was then applied to the left lower extremity utilizing the [facility]  traction. The femoral head was then removed. Retractors were placed  around the acetabulum and then circumferential removal of the labrum was  performed. Osteophytes were also removed. Reaming starts at 49 mm to  medialize and  Increased in 2 mm increments to 51 mm. We reamed in  approximately 40 degrees of abduction, 20 degrees anteversion. A 52 mm  pinnacle acetabular shell was then impacted in anatomic position under  fluoroscopic guidance with excellent purchase. We did not need to place  any additional dome screws. A 32 mm neutral + 4 marathon liner was then  placed into the acetabular shell.       The femoral lift was then placed along the lateral aspect of the femur  just distal to the vastus ridge. The leg was  externally rotated and capsule  was stripped off the inferior aspect of the femoral neck down to the  level of the lesser trochanter, this was done with electrocautery. The femur was lifted after this was performed. The  leg was then placed in an extended and adducted position essentially delivering the femur. We also removed the capsule superiorly and the piriformis from the piriformis  fossa to gain excellent exposure of the  proximal femur. Rongeur was used to remove some cancellous bone to get  into the lateral portion of the proximal femur for placement of the  initial starter reamer. The starter broaches was placed  the starter broach  and was shown to go down the center of the canal. Broaching  with the  Corail system was then performed starting at size 8, coursing  Up to size 11. A size 11 had excellent torsional and rotational  and axial stability. The trial high offset neck was then  placed  with a 32 + 1 trial head. The hip was then reduced. We confirmed that  the stem was in the canal both on AP and lateral x-rays. It also has excellent sizing. The hip was reduced with outstanding stability through full extension and full external rotation.. AP pelvis was taken and the leg lengths were measured and found to be equal. Hip was then dislocated again and the femoral head and neck removed. The  femoral broach was removed. Size 11 Corail stem with a highoffset  neck was then impacted into the femur following native anteversion. Has  excellent purchase in the canal. Excellent torsional and rotational and  axial stability. It is confirmed to be in the canal on AP and lateral  fluoroscopic views. The 32 + 1 ceramic head was placed and the hip  reduced with outstanding stability. Again AP pelvis was taken and it  confirmed that the leg lengths were equal. The wound was then copiously  irrigated with saline solution and the capsule reattached and repaired  with Ethibond suture. 30 ml of .25% Bupivicaine was  injected into the capsule and into the edge of the tensor fascia lata as well as subcutaneous tissue. The fascia overlying the tensor fascia lata was then closed with a running #1 V-Loc. Subcu was closed with interrupted 2-0 Vicryl and subcuticular running 4-0 Monocryl. Incision was cleaned  and dried. Steri-Strips and a bulky sterile dressing applied. Hemovac  drain was hooked to suction and then the patient was awakened and transported to  recovery in stable condition.        Please note that a surgical assistant was a medical necessity for this procedure to perform it in a safe and expeditious manner. Assistant was necessary to provide appropriate retraction of vital neurovascular structures and to prevent femoral fracture and allow for anatomic placement of the prosthesis.  Gaynelle Arabian, M.D.

## 2018-03-23 ENCOUNTER — Encounter (HOSPITAL_COMMUNITY): Payer: Self-pay | Admitting: Orthopedic Surgery

## 2018-03-23 LAB — BASIC METABOLIC PANEL
ANION GAP: 9 (ref 5–15)
BUN: 10 mg/dL (ref 8–23)
CHLORIDE: 102 mmol/L (ref 98–111)
CO2: 26 mmol/L (ref 22–32)
Calcium: 9.1 mg/dL (ref 8.9–10.3)
Creatinine, Ser: 0.61 mg/dL (ref 0.44–1.00)
GFR calc Af Amer: >60 mL/min (ref >60)
GLUCOSE: 178 mg/dL — AB (ref 70–99)
POTASSIUM: 4.2 mmol/L (ref 3.5–5.1)
Sodium: 137 mmol/L (ref 135–145)

## 2018-03-23 LAB — CBC
HCT: 35.6 % — ABNORMAL LOW (ref 36.0–46.0)
Hemoglobin: 11.7 g/dL — ABNORMAL LOW (ref 12.0–15.0)
MCH: 31.2 pg (ref 26.0–34.0)
MCHC: 32.9 g/dL (ref 30.0–36.0)
MCV: 94.9 fL (ref 78.0–100.0)
Platelets: 311 10*3/uL (ref 150–400)
RBC: 3.75 MIL/uL — ABNORMAL LOW (ref 3.87–5.11)
RDW: 13.5 % (ref 11.5–15.5)
WBC: 10.5 10*3/uL (ref 4.0–10.5)

## 2018-03-23 MED ORDER — HYDROMORPHONE HCL 2 MG PO TABS: 2.0000 mg | ORAL_TABLET | ORAL | 0 refills | Status: DC | PRN

## 2018-03-23 MED ORDER — RIVAROXABAN 10 MG PO TABS: 10.0000 mg | ORAL_TABLET | Freq: Every day | ORAL | 0 refills | Status: DC

## 2018-03-23 MED ORDER — METHOCARBAMOL 500 MG PO TABS: 500.0000 mg | ORAL_TABLET | Freq: Four times a day (QID) | ORAL | 0 refills | Status: DC | PRN

## 2018-03-23 MED ORDER — BUPIVACAINE IN DEXTROSE 0.75-8.25 % IT SOLN
INTRATHECAL | Status: DC | PRN
  Administered 2018-03-22: 1.8 mL via INTRATHECAL

## 2018-03-23 NOTE — Progress Notes (Signed)
RN reviewed discharge instructions with patient and family. All questions answered.   Paperwork and prescriptions given.   NT rolled patient down with all belongings to family car. 

## 2018-03-23 NOTE — Progress Notes (Signed)
Discharge planning, spoke with patient at bedside. Have chosen [facility] for [facility] PT, evaluate and treat. Contacted [facility] at [address] for referral. Has DME. [phone]

## 2018-03-23 NOTE — Progress Notes (Signed)
HS blood sugar 203 per Sylvie Farrier, Nurse Tech.

## 2018-03-23 NOTE — Discharge Summary (Signed)
Physician Discharge Summary   Patient ID: Carolyn Glass MRN: [id number] DOB/AGE: Apr 01, 1956 62 y.o.  Admit date: 03/22/2018 Discharge date: 03/23/2018  Primary Diagnosis: Primary osteoarthritis left hip  Admission Diagnoses:  Past Medical History:  Diagnosis Date  . Anxiety   . Arthritis    OA hips  . Asthma    allergy and exercise induced asthma   . Depression   . Diabetes mellitus without complication ([place])   . Family history of breast cancer   . Family history of colon cancer   . Family history of stomach cancer   . GERD (gastroesophageal reflux disease)   . Hypertension   . Malignant neoplasm of lower-outer quadrant of right female breast ([place]) 06/21/2016  . Seasonal allergies    Discharge Diagnoses:   Active Problems:   OA (osteoarthritis) of hip  Estimated body mass index is 30.6 kg/m as calculated from the following:   Height as of this encounter: 5' 2.5" (1.588 m).   Weight as of this encounter: 77.1 kg (170 lb).  Procedure(s) (LRB): LEFT TOTAL HIP ARTHROPLASTY ANTERIOR APPROACH (Left)   Consults: None  HPI: Carolyn Glass, 62 y.o. female, has a history of pain and functional disability in the left hip(s) due to arthritis and patient has failed non-surgical conservative treatments for greater than 12 weeks to include NSAID's and/or analgesics, corticosteriod injections and activity modification.  Onset of symptoms was gradual starting several years ago with gradually worsening course since that time.The patient noted no past surgery on the left hip(s).  Patient currently rates pain in the left hip at 9 out of 10 with activity. Patient has worsening of pain with activity and weight bearing, pain that interfers with activities of daily living and pain with passive range of motion. Patient has evidence of periarticular osteophytes and joint space narrowing by imaging studies. This condition presents safety issues increasing the risk of falls. There is no current active  infection.     Laboratory Data: Admission on 03/22/2018, Discharged on 03/23/2018  Component Date Value Ref Range Status  . Glucose-Capillary 03/22/2018 113* 70 - 99 mg/dL Final  . Glucose-Capillary 03/22/2018 143* 70 - 99 mg/dL Final  . WBC 03/23/2018 10.5  4.0 - 10.5 K/uL Final  . RBC 03/23/2018 3.75* 3.87 - 5.11 MIL/uL Final  . Hemoglobin 03/23/2018 11.7* 12.0 - 15.0 g/dL Final  . HCT 03/23/2018 35.6* 36.0 - 46.0 % Final  . MCV 03/23/2018 94.9  78.0 - 100.0 fL Final  . MCH 03/23/2018 31.2  26.0 - 34.0 pg Final  . MCHC 03/23/2018 32.9  30.0 - 36.0 g/dL Final  . RDW 03/23/2018 13.5  11.5 - 15.5 % Final  . Platelets 03/23/2018 311  150 - 400 K/uL Final   Performed at [facility], [place] [address] [place], [place] [address]  . Sodium 03/23/2018 137  135 - 145 mmol/L Final  . Potassium 03/23/2018 4.2  3.5 - 5.1 mmol/L Final  . Chloride 03/23/2018 102  98 - 111 mmol/L Final   Please note change in reference range.  . CO2 03/23/2018 26  22 - 32 mmol/L Final  . Glucose, Bld 03/23/2018 178* 70 - 99 mg/dL Final   Please note change in reference range.  . BUN 03/23/2018 10  8 - 23 mg/dL Final   Please note change in reference range.  . Creatinine, Ser 03/23/2018 0.61  0.44 - 1.00 mg/dL Final  . Calcium 03/23/2018 9.1  8.9 - 10.3 mg/dL Final  . GFR  calc non Af Amer 03/23/2018 >60  >60 mL/min Final  . GFR calc Af Amer 03/23/2018 >60  >60 mL/min Final   Comment: (NOTE) The eGFR has been calculated using the CKD EPI equation. This calculation has not been validated in all clinical situations. eGFR's persistently <60 mL/min signify possible Chronic Kidney Disease.   Georgiann Hahn gap 03/23/2018 9  5 - 15 Final   Performed at [facility], [place] [address] [place], [place] [address]  . Glucose-Capillary 03/22/2018 203* 70 - 99 mg/dL Final  Hospital Outpatient Visit on 03/13/2018  Component Date Value Ref Range Status  . aPTT 03/13/2018 32  24 - 36 seconds  Final   Performed at [facility], [place] [address], [place], [place] [address]  . WBC 03/13/2018 5.4  4.0 - 10.5 K/uL Final  . RBC 03/13/2018 4.01  3.87 - 5.11 MIL/uL Final  . Hemoglobin 03/13/2018 12.4  12.0 - 15.0 g/dL Final  . HCT 03/13/2018 37.5  36.0 - 46.0 % Final  . MCV 03/13/2018 93.5  78.0 - 100.0 fL Final  . MCH 03/13/2018 30.9  26.0 - 34.0 pg Final  . MCHC 03/13/2018 33.1  30.0 - 36.0 g/dL Final  . RDW 03/13/2018 13.2  11.5 - 15.5 % Final  . Platelets 03/13/2018 341  150 - 400 K/uL Final   Performed at [facility], [place] [address] [place], [place] [address]  . Sodium 03/13/2018 141  135 - 145 mmol/L Final  . Potassium 03/13/2018 4.2  3.5 - 5.1 mmol/L Final  . Chloride 03/13/2018 105  98 - 111 mmol/L Final   Please note change in reference range.  . CO2 03/13/2018 28  22 - 32 mmol/L Final  . Glucose, Bld 03/13/2018 126* 70 - 99 mg/dL Final   Please note change in reference range.  . BUN 03/13/2018 15  8 - 23 mg/dL Final   Please note change in reference range.  . Creatinine, Ser 03/13/2018 0.69  0.44 - 1.00 mg/dL Final  . Calcium 03/13/2018 9.0  8.9 - 10.3 mg/dL Final  . Total Protein 03/13/2018 7.1  6.5 - 8.1 g/dL Final  . Albumin 03/13/2018 4.0  3.5 - 5.0 g/dL Final  . AST 03/13/2018 21  15 - 41 U/L Final  . ALT 03/13/2018 20  0 - 44 U/L Final   Please note change in reference range.  . Alkaline Phosphatase 03/13/2018 93  38 - 126 U/L Final  . Total Bilirubin 03/13/2018 0.3  0.3 - 1.2 mg/dL Final  . GFR calc non Af Amer 03/13/2018 >60  >60 mL/min Final  . GFR calc Af Amer 03/13/2018 >60  >60 mL/min Final   Comment: (NOTE) The eGFR has been calculated using the CKD EPI equation. This calculation has not been validated in all clinical situations. eGFR's persistently <60 mL/min signify possible Chronic Kidney Disease.   Georgiann Hahn gap 03/13/2018 8  5 - 15 Final   Performed at [facility], [place] [address]  [place], [place] [address]  . Prothrombin Time 03/13/2018 13.0  11.4 - 15.2 seconds Final  . INR 03/13/2018 0.99   Final   Performed at [facility], [place] [address], [place], [place] [address]  . ABO/RH(D) 03/13/2018 O POS   Final  . Antibody Screen 03/13/2018 NEG   Final  . Sample Expiration 03/13/2018 03/25/2018   Final  . Extend sample reason 03/13/2018    Final  Value:NO TRANSFUSIONS OR PREGNANCY IN THE PAST 3 MONTHS Performed at [place] [address], [place], [place] [address]   . Color, Urine 03/13/2018 YELLOW  YELLOW Final  . APPearance 03/13/2018 CLEAR  CLEAR Final  . Specific Gravity, Urine 03/13/2018 1.023  1.005 - 1.030 Final  . pH 03/13/2018 5.0  5.0 - 8.0 Final  . Glucose, UA 03/13/2018 NEGATIVE  NEGATIVE mg/dL Final  . Hgb urine dipstick 03/13/2018 MODERATE* NEGATIVE Final  . Bilirubin Urine 03/13/2018 NEGATIVE  NEGATIVE Final  . Ketones, ur 03/13/2018 NEGATIVE  NEGATIVE mg/dL Final  . Protein, ur 03/13/2018 NEGATIVE  NEGATIVE mg/dL Final  . Nitrite 03/13/2018 NEGATIVE  NEGATIVE Final  . Leukocytes, UA 03/13/2018 NEGATIVE  NEGATIVE Final  . RBC / HPF 03/13/2018 0-5  0 - 5 RBC/hpf Final  . WBC, UA 03/13/2018 0-5  0 - 5 WBC/hpf Final  . Bacteria, UA 03/13/2018 RARE* NONE SEEN Final   Performed at [facility], [place] [address], [place], [place] [address]  . MRSA, PCR 03/13/2018 NEGATIVE  NEGATIVE Final  . Staphylococcus aureus 03/13/2018 NEGATIVE  NEGATIVE Final   Comment: (NOTE) The Xpert SA Assay (FDA approved for NASAL specimens in patients 30 years of age and older), is one component of a comprehensive surveillance program. It is not intended to diagnose infection nor to guide or monitor treatment. Performed at [facility], [place] [address], [place], [place] [address]   . Glucose-Capillary 03/13/2018 132* 70 - 99 mg/dL Final  Office Visit on 02/14/2018  Component Date  Value Ref Range Status  . Glucose, Bld 02/14/2018 129* 65 - 99 mg/dL Final   Comment: .            Fasting reference interval . For someone without known diabetes, a glucose value >125 mg/dL indicates that they may have diabetes and this should be confirmed with a follow-up test. .   . BUN 02/14/2018 13  7 - 25 mg/dL Final  . Creat 02/14/2018 0.69  0.50 - 0.99 mg/dL Final   Comment: For patients >73 years of age, the reference limit for Creatinine is approximately 13% higher for people identified as African-American. .   . GFR, Est Non African American 02/14/2018 93  > OR = 60 mL/min/1.77m Final  . GFR, Est African American 02/14/2018 108  > OR = 60 mL/min/1.757mFinal  . BUN/Creatinine Ratio 0625/36/6440OT APPLICABLE  6 - 22 (calc) Final  . Sodium 02/14/2018 140  135 - 146 mmol/L Final  . Potassium 02/14/2018 4.2  3.5 - 5.3 mmol/L Final  . Chloride 02/14/2018 101  98 - 110 mmol/L Final  . CO2 02/14/2018 27  20 - 32 mmol/L Final  . Calcium 02/14/2018 9.8  8.6 - 10.4 mg/dL Final  . Total Protein 02/14/2018 7.2  6.1 - 8.1 g/dL Final  . Albumin 02/14/2018 4.6  3.6 - 5.1 g/dL Final  . Globulin 02/14/2018 2.6  1.9 - 3.7 g/dL (calc) Final  . AG Ratio 02/14/2018 1.8  1.0 - 2.5 (calc) Final  . Total Bilirubin 02/14/2018 0.5  0.2 - 1.2 mg/dL Final  . Alkaline phosphatase (APISO) 02/14/2018 90  33 - 130 U/L Final  . AST 02/14/2018 19  10 - 35 U/L Final  . ALT 02/14/2018 20  6 - 29 U/L Final  . Cholesterol 02/14/2018 170  <200 mg/dL Final  . HDL 02/14/2018 39* >50 mg/dL Final  . Triglycerides 02/14/2018 165* <150 mg/dL Final  . LDL Cholesterol (Calc)  02/14/2018 103* mg/dL (calc) Final   Comment: Reference range: <100 . Desirable range <100 mg/dL for primary prevention;   <70 mg/dL for patients with CHD or diabetic patients  with > or = 2 CHD risk factors. Marland Kitchen LDL-C is now calculated using the Martin-Hopkins  calculation, which is a validated novel method providing  better accuracy  than the Friedewald equation in the  estimation of LDL-C.  Cresenciano Genre et al. Annamaria Helling. 8315;176(16): 2061-2068  (http://education.[employer].com/faq/FAQ164)   . Total CHOL/HDL Ratio 02/14/2018 4.4  <5.0 (calc) Final  . Non-HDL Cholesterol (Calc) 02/14/2018 131* <130 mg/dL (calc) Final   Comment: For patients with diabetes plus 1 major ASCVD risk  factor, treating to a non-HDL-C goal of <100 mg/dL  (LDL-C of <70 mg/dL) is considered a therapeutic  option.   Jacquelyne Balint, Ur 02/14/2018 1.0  mg/dL Final   Comment: Reference Range Not established   . RAM 02/14/2018    Final   Comment: . The ADA defines abnormalities in albumin excretion as follows: Marland Kitchen Category         Result (mcg/mg creatinine) . Normal                    <30 Microalbuminuria         30-299  Clinical albuminuria   > OR = 300 . The ADA recommends that at least two of three specimens collected within a 3-6 month period be abnormal before considering a patient to be within a diagnostic category.   . Hgb A1c MFr Bld 02/14/2018 6.6* <5.7 % of total Hgb Final   Comment: For someone without known diabetes, a hemoglobin A1c value of 6.5% or greater indicates that they may have  diabetes and this should be confirmed with a follow-up  test. . For someone with known diabetes, a value <7% indicates  that their diabetes is well controlled and a value  greater than or equal to 7% indicates suboptimal  control. A1c targets should be individualized based on  duration of diabetes, age, comorbid conditions, and  other considerations. . Currently, no consensus exists regarding use of hemoglobin A1c for diagnosis of diabetes for children. .   . Mean Plasma Glucose 02/14/2018 143  (calc) Final  . eAG (mmol/L) 02/14/2018 7.9  (calc) Final     X-Rays:Dg Pelvis Portable  Result Date: 03/22/2018 CLINICAL DATA:  Post hip replacement EXAM: PORTABLE PELVIS 1-2 VIEWS COMPARISON:  Portable exam 1407 hours compared to  intraoperative images of 03/22/2018 and to an earlier study of 01/07/2016 FINDINGS: BILATERAL hip prostheses are identified. Surgical drain overlies the LEFT hip. Small bone fragment is seen inferior to the LEFT acetabular cup. No acute fracture, dislocation, or bone destruction. IMPRESSION: BILATERAL hip prostheses as above. No acute abnormalities. Electronically Signed   By: Lavonia Dana M.D.   On: 03/22/2018 16:17   Dg C-arm 1-60 Min-no Report  Result Date: 03/22/2018 Fluoroscopy was utilized by the requesting physician.  No radiographic interpretation.    EKG: Orders placed or performed during the hospital encounter of 03/13/18  . EKG 12 lead  . EKG 12 lead     Hospital Course: Patient was admitted to [facility] and taken to the OR and underwent the above state procedure without complications.  Patient tolerated the procedure well and was later transferred to the recovery room and then to the orthopaedic floor for postoperative care.  They were given PO and IV analgesics for pain control following their surgery.  They were given  24 hours of postoperative antibiotics of  Anti-infectives (From admission, onward)   Start     Dose/Rate Route Frequency Ordered Stop   03/22/18 1830  ceFAZolin (ANCEF) IVPB 2g/100 mL premix     2 g 200 mL/hr over 30 Minutes Intravenous Every 6 hours 03/22/18 1539 03/23/18 0154   03/22/18 1030  ceFAZolin (ANCEF) IVPB 2g/100 mL premix     2 g 200 mL/hr over 30 Minutes Intravenous On call to O.R. 03/22/18 1015 03/22/18 1225     and started on DVT prophylaxis in the form of Xarelto.   PT and OT were ordered for total hip protocol.  The patient was allowed to be WBAT with therapy. Discharge planning was consulted to help with postop disposition and equipment needs.  Patient had a good night on the evening of surgery.  They started to get up OOB with therapy on day one.  Hemovac drain was pulled without difficulty.  Continued to work with therapy into day two.   The patient had progressed with therapy and meeting their goals.  Incision was healing well.  Patient was seen in rounds and was ready to go home.  Diet: Cardiac diet Activity:WBAT Follow-up:in 2 weeks Disposition - Home Discharged Condition: stable   Discharge Instructions    Call MD / Call 911   Complete by:  As directed    If you experience chest pain or shortness of breath, CALL 911 and be transported to the hospital emergency room.  If you develope a fever above 101 F, pus (white drainage) or increased drainage or redness at the wound, or calf pain, call your surgeon's office.   Constipation Prevention   Complete by:  As directed    Drink plenty of fluids.  Prune juice may be helpful.  You may use a stool softener, such as Colace (over the counter) 100 mg twice a day.  Use MiraLax (over the counter) for constipation as needed.   Diet - low sodium heart healthy   Complete by:  As directed    Diet Carb Modified   Complete by:  As directed    Discharge instructions   Complete by:  As directed    Dr. Gaynelle Arabian Total Joint Specialist [facility] [address], [place], [place] [address] [phone]  ANTERIOR APPROACH TOTAL HIP REPLACEMENT POSTOPERATIVE DIRECTIONS   Hip Rehabilitation, Guidelines Following Surgery  The results of a hip operation are greatly improved after range of motion and muscle strengthening exercises. Follow all safety measures which are given to protect your hip. If any of these exercises cause increased pain or swelling in your joint, decrease the amount until you are comfortable again. Then slowly increase the exercises. Call your caregiver if you have problems or questions.   HOME CARE INSTRUCTIONS  Remove items at home which could result in a fall. This includes throw rugs or furniture in walking pathways.  ICE to the affected hip every three hours for 30 minutes at a time and then as needed for pain and swelling.  Continue to use ice  on the hip for pain and swelling from surgery. You may notice swelling that will progress down to the foot and ankle.  This is normal after surgery.  Elevate the leg when you are not up walking on it.   Continue to use the breathing machine which will help keep your temperature down.  It is common for your temperature to cycle up and down following surgery, especially at night when  you are not up moving around and exerting yourself.  The breathing machine keeps your lungs expanded and your temperature down.   DIET You may resume your previous home diet once your are discharged from the hospital.  DRESSING / WOUND CARE / SHOWERING You may change your dressing every day with sterile gauze.  Please use good hand washing techniques before changing the dressing.  Do not use any lotions or creams on the incision until instructed by your surgeon. You may start showering once you are discharged home but do not submerge the incision under water. Just pat the incision dry and apply a dry gauze dressing on daily. Change the surgical dressing daily and reapply a dry dressing each time.  ACTIVITY Walk with your walker as instructed. Use walker as long as suggested by your caregivers. Avoid periods of inactivity such as sitting longer than an hour when not asleep. This helps prevent blood clots.  You may resume a sexual relationship in one month or when given the OK by your doctor.  You may return to work once you are cleared by your doctor.  Do not drive a car for 6 weeks or until released by you surgeon.  Do not drive while taking narcotics.  WEIGHT BEARING Weight bearing as tolerated with assist device (walker, cane, etc) as directed, use it as long as suggested by your surgeon or therapist, typically at least 4-6 weeks.  POSTOPERATIVE CONSTIPATION PROTOCOL Constipation - defined medically as fewer than three stools per week and severe constipation as less than one stool per week.  One of the most  common issues patients have following surgery is constipation.  Even if you have a regular bowel pattern at home, your normal regimen is likely to be disrupted due to multiple reasons following surgery.  Combination of anesthesia, postoperative narcotics, change in appetite and fluid intake all can affect your bowels.  In order to avoid complications following surgery, here are some recommendations in order to help you during your recovery period.  Colace (docusate) - Pick up an over-the-counter form of Colace or another stool softener and take twice a day as long as you are requiring postoperative pain medications.  Take with a full glass of water daily.  If you experience loose stools or diarrhea, hold the colace until you stool forms back up.  If your symptoms do not get better within 1 week or if they get worse, check with your doctor.  Dulcolax (bisacodyl) - Pick up over-the-counter and take as directed by the product packaging as needed to assist with the movement of your bowels.  Take with a full glass of water.  Use this product as needed if not relieved by Colace only.   MiraLax (polyethylene glycol) - Pick up over-the-counter to have on hand.  MiraLax is a solution that will increase the amount of water in your bowels to assist with bowel movements.  Take as directed and can mix with a glass of water, juice, soda, coffee, or tea.  Take if you go more than two days without a movement. Do not use MiraLax more than once per day. Call your doctor if you are still constipated or irregular after using this medication for 7 days in a row.  If you continue to have problems with postoperative constipation, please contact the office for further assistance and recommendations.  If you experience "the worst abdominal pain ever" or develop nausea or vomiting, please contact the office immediatly for further recommendations for treatment.  ITCHING  If you experience itching with your medications, try taking  only a single pain pill, or even half a pain pill at a time.  You can also use Benadryl over the counter for itching or also to help with sleep.   TED HOSE STOCKINGS Wear the elastic stockings on both legs for three weeks following surgery during the day but you may remove then at night for sleeping.  MEDICATIONS See your medication summary on the "After Visit Summary" that the nursing staff will review with you prior to discharge.  You may have some home medications which will be placed on hold until you complete the course of blood thinner medication.  It is important for you to complete the blood thinner medication as prescribed by your surgeon.  Continue your approved medications as instructed at time of discharge.  PRECAUTIONS If you experience chest pain or shortness of breath - call 911 immediately for transfer to the hospital emergency department.  If you develop a fever greater that 101 F, purulent drainage from wound, increased redness or drainage from wound, foul odor from the wound/dressing, or calf pain - CONTACT YOUR SURGEON.                                                   FOLLOW-UP APPOINTMENTS Make sure you keep all of your appointments after your operation with your surgeon and caregivers. You should call the office at the above phone number and make an appointment for approximately two weeks after the date of your surgery or on the date instructed by your surgeon outlined in the "After Visit Summary".  RANGE OF MOTION AND STRENGTHENING EXERCISES  These exercises are designed to help you keep full movement of your hip joint. Follow your caregiver's or physical therapist's instructions. Perform all exercises about fifteen times, three times per day or as directed. Exercise both hips, even if you have had only one joint replacement. These exercises can be done on a training (exercise) mat, on the floor, on a table or on a bed. Use whatever works the best and is most comfortable for  you. Use music or television while you are exercising so that the exercises are a pleasant break in your day. This will make your life better with the exercises acting as a break in routine you can look forward to.  Lying on your back, slowly slide your foot toward your buttocks, raising your knee up off the floor. Then slowly slide your foot back down until your leg is straight again.  Lying on your back spread your legs as far apart as you can without causing discomfort.  Lying on your side, raise your upper leg and foot straight up from the floor as far as is comfortable. Slowly lower the leg and repeat.  Lying on your back, tighten up the muscle in the front of your thigh (quadriceps muscles). You can do this by keeping your leg straight and trying to raise your heel off the floor. This helps strengthen the largest muscle supporting your knee.  Lying on your back, tighten up the muscles of your buttocks both with the legs straight and with the knee bent at a comfortable angle while keeping your heel on the floor.   IF YOU ARE TRANSFERRED TO A SKILLED REHAB FACILITY If the patient is transferred to a  skilled rehab facility following release from the hospital, a list of the current medications will be sent to the facility for the patient to continue.  When discharged from the skilled rehab facility, please have the facility set up the patient's [place] prior to being released. Also, the skilled facility will be responsible for providing the patient with their medications at time of release from the facility to include their pain medication, the muscle relaxants, and their blood thinner medication. If the patient is still at the rehab facility at time of the two week follow up appointment, the skilled rehab facility will also need to assist the patient in arranging follow up appointment in our office and any transportation needs.  MAKE SURE YOU:  Understand these instructions.  Get  help right away if you are not doing well or get worse.    Pick up stool softner and laxative for home use following surgery while on pain medications. Do not submerge incision under water. Please use good hand washing techniques while changing dressing each day. May shower starting three days after surgery. Please use a clean towel to pat the incision dry following showers. Continue to use ice for pain and swelling after surgery. Do not use any lotions or creams on the incision until instructed by your surgeon.   Increase activity slowly as tolerated   Complete by:  As directed      Allergies as of 03/23/2018      Reactions   Corn-containing Products Itching   Running nose   Tomato Itching   Running nose    Azithromycin Itching, Rash   Biaxin [clarithromycin] Rash   Codeine Rash   Thinks a codeine cough syrup caused rash.      Medication List    TAKE these medications   albuterol 108 (90 Base) MCG/ACT inhaler Commonly known as:  PROVENTIL HFA;VENTOLIN HFA Inhale 2 puffs into the lungs every 6 (six) hours as needed for wheezing or shortness of breath.   benazepril 20 MG tablet Commonly known as:  LOTENSIN TAKE (1) TABLET BY MOUTH TWICE DAILY.   benazepril 20 MG tablet Commonly known as:  LOTENSIN TAKE (1) TABLET BY MOUTH TWICE DAILY.   diclofenac sodium 1 % Gel Commonly known as:  VOLTAREN Apply 4 g topically 4 (four) times daily. What changed:    when to take this  reasons to take this Notes to patient:  Home regimen; do not use on surgical site   escitalopram 20 MG tablet Commonly known as:  LEXAPRO TAKE ONE TABLET BY MOUTH ONCE DAILY.   exemestane 25 MG tablet Commonly known as:  AROMASIN Take 1 tablet (25 mg total) by mouth daily after breakfast.   HYDROmorphone 2 MG tablet Commonly known as:  DILAUDID Take 1 tablet (2 mg total) by mouth every 4 (four) hours as needed for moderate pain.   levocetirizine 5 MG tablet Commonly known as:  XYZAL Take 5  mg by mouth at bedtime.   metFORMIN 1000 MG tablet Commonly known as:  GLUCOPHAGE TAKE 1 TABLET BY MOUTH TWICE DAILY WITH MEALS.   methocarbamol 500 MG tablet Commonly known as:  ROBAXIN Take 1 tablet (500 mg total) by mouth every 6 (six) hours as needed for muscle spasms.   omeprazole 20 MG capsule Commonly known as:  PRILOSEC TAKE (1) CAPSULE BY MOUTH ONCE DAILY.   rivaroxaban 10 MG Tabs tablet Commonly known as:  XARELTO Take 1 tablet (10 mg total) by mouth daily with breakfast.  TYLENOL 8 HOUR ARTHRITIS PAIN 650 MG CR tablet Generic drug:  acetaminophen Take 1,300 mg by mouth every 8 (eight) hours as needed for pain.      Follow-up Information    Gaynelle Arabian, MD. Schedule an appointment as soon as possible for a visit on 04/04/2018.   Specialty:  Orthopedic Surgery Contact information: [address] [place] [address] [phone]        [facility] Follow up.   Specialty:  Home Health Services Why:  Physical therapy Contact information: [address] [place] [address] [phone]           Signed: Ardeen Jourdain, PA-C Orthopaedic Surgery 03/23/2018, 8:03 PM  al

## 2018-03-23 NOTE — Evaluation (Signed)
Physical Therapy Evaluation Patient Details Name: Carolyn Glass MRN: [id number] DOB: 02-20-1956 Today's Date: 03/23/2018   History of Present Illness  L DA THA  Clinical Impression  The [patient is mobilizing well. Reports that pain is less than prior to surgery. Plans DC after next PT visit/steps.    Follow Up Recommendations Follow surgeon's recommendation for DC plan and follow-up therapies;Home health PT    Equipment Recommendations  None recommended by PT    Recommendations for Other Services       Precautions / Restrictions Precautions Precautions: Fall      Mobility  Bed Mobility Overal bed mobility: Needs Assistance Bed Mobility: Supine to Sit     Supine to sit: Min guard;HOB elevated     General bed mobility comments: used belt to self assist the left leg  Transfers Overall transfer level: Needs assistance Equipment used: Rolling walker (2 wheeled) Transfers: Sit to/from Stand Sit to Stand: Min guard         General transfer comment: cues for ahnd and  left leg position  Ambulation/Gait Ambulation/Gait assistance: Min assist;Min guard Gait Distance (Feet): 20 Feet(then 200) Assistive device: Rolling walker (2 wheeled) Gait Pattern/deviations: Step-to pattern;Step-through pattern;Decreased step length - left     General Gait Details: gradual recirocal gait achieved  Stairs            Wheelchair Mobility    Modified Rankin (Stroke Patients Only)       Balance                                             Pertinent Vitals/Pain Pain Assessment: 0-10 Pain Score: 5  Pain Location: lateral thigh Pain Descriptors / Indicators: Discomfort;Dull;Sore Pain Intervention(s): Monitored during session;Premedicated before session;Ice applied    Home Living Family/patient expects to be discharged to:: Private residence Living Arrangements: Spouse/significant other Available Help at Discharge: Family Type of Home: House Home  Access: Stairs to enter Entrance Stairs-Rails: None Entrance Stairs-Number of Steps: 3 Home Layout: One level Home Equipment: Environmental consultant - 2 wheels;Cane - single point;Crutches      Prior Function Level of Independence: Independent               Hand Dominance        Extremity/Trunk Assessment   Upper Extremity Assessment Upper Extremity Assessment: Overall WFL for tasks assessed    Lower Extremity Assessment LLE Deficits / Details: able to advanc ethe  left leg, reciprocal gait       Communication   Communication: No difficulties  Cognition Arousal/Alertness: Awake/alert Behavior During Therapy: WFL for tasks assessed/performed Overall Cognitive Status: Within Functional Limits for tasks assessed                                        General Comments      Exercises     Assessment/Plan    PT Assessment Patient needs continued PT services  PT Problem List Decreased strength;Decreased range of motion;Decreased activity tolerance;Decreased mobility;Pain;Decreased knowledge of use of DME       PT Treatment Interventions DME instruction;Therapeutic exercise;Gait training;Stair training;Functional mobility training;Therapeutic activities;Patient/family education    PT Goals (Current goals can be found in the Care Plan section)  Acute Rehab PT Goals Patient Stated Goal: to take care og my new  grandbaby PT Goal Formulation: With patient Time For Goal Achievement: 03/23/18 Potential to Achieve Goals: Good    Frequency 7X/week   Barriers to discharge        Co-evaluation               AM-PAC PT "6 Clicks" Daily Activity  Outcome Measure Difficulty turning over in bed (including adjusting bedclothes, sheets and blankets)?: A Little Difficulty moving from lying on back to sitting on the side of the bed? : A Little Difficulty sitting down on and standing up from a chair with arms (e.g., wheelchair, bedside commode, etc,.)?: A Little Help  needed moving to and from a bed to chair (including a wheelchair)?: A Little Help needed walking in hospital room?: A Little Help needed climbing 3-5 steps with a railing? : A Lot 6 Click Score: 17    End of Session   Activity Tolerance: Patient tolerated treatment well Patient left: in chair;with call bell/phone within reach Nurse Communication: Mobility status PT Visit Diagnosis: Unsteadiness on feet (R26.81)    Time: 8677-3736 PT Time Calculation (min) (ACUTE ONLY): 33 min   Charges:   PT Evaluation $PT Eval Low Complexity: 1 Low PT Treatments $Gait Training: 8-22 mins   PT G CodesTresa Endo PT [phone]   Claretha Cooper 03/23/2018, 9:57 AM

## 2018-03-23 NOTE — Addendum Note (Signed)
Addendum  created 03/23/18 1222 by Mitzie Na, CRNA   Intraprocedure Meds edited

## 2018-03-23 NOTE — Progress Notes (Signed)
Physical Therapy Treatment Patient Details Name: Carolyn Glass MRN: [id number] DOB: 1955/12/16 Today's Date: 03/23/2018    History of Present Illness Carolyn Glass    PT Comments    The patient practiced steps and HEP. Ready for Dc.   Follow Up Recommendations  Follow surgeon's recommendation for DC plan and follow-up therapies;Home health PT     Equipment Recommendations  None recommended by PT    Recommendations for Other Services       Precautions / Restrictions Precautions Precautions: Fall    Mobility  Bed Mobility Overal bed mobility: Needs Assistance Bed Mobility: Sit to Supine     Supine to sit: Min guard;HOB elevated Sit to supine: Min assist   General bed mobility comments: assist left leg onto bed with use  of belt  Transfers Overall transfer level: Needs assistance Equipment used: Rolling walker (2 wheeled) Transfers: Sit to/from Stand Sit to Stand: Min guard         General transfer comment: cues for hand and  left leg position  Ambulation/Gait Ambulation/Gait assistance: Min guard Gait Distance (Feet): 50 Feet Assistive device: Rolling walker (2 wheeled) Gait Pattern/deviations: Step-to pattern;Step-through pattern;Decreased step length - left;Antalgic     General Gait Details: gradual recirocal gait achieved   Stairs Stairs: Yes Stairs assistance: Min assist Stair Management: No rails;Backwards;With walker Number of Stairs: 2 General stair comments: cues for sequence   Wheelchair Mobility    Modified Rankin (Stroke Patients Only)       Balance                                            Cognition Arousal/Alertness: Awake/alert Behavior During Therapy: WFL for tasks assessed/performed Overall Cognitive Status: Within Functional Limits for tasks assessed                                        Exercises Total Joint Exercises Ankle Circles/Pumps: AROM;Both;10 reps Short Arc Quad:  AROM;Left;10 reps Heel Slides: AAROM;Left;10 reps Hip ABduction/ADduction: AAROM;Left;10 reps    General Comments        Pertinent Vitals/Pain Pain Assessment: 0-10 Pain Score: 6  Pain Location: lateral thigh, left Pain Descriptors / Indicators: Discomfort;Dull;Sore;Cramping Pain Intervention(s): Patient requesting pain meds-RN notified;Repositioned    Home Living Family/patient expects to be discharged to:: Private residence Living Arrangements: Spouse/significant other Available Help at Discharge: Family Type of Home: House Home Access: Stairs to enter Entrance Stairs-Rails: None Home Layout: One level Home Equipment: Environmental consultant - 2 wheels;Cane - single point;Crutches      Prior Function Level of Independence: Independent          PT Goals (current goals can now be found in the care plan section) Acute Rehab PT Goals Patient Stated Goal: to take care og my new grandbaby PT Goal Formulation: With patient Time For Goal Achievement: 03/23/18 Potential to Achieve Goals: Good Progress towards PT goals: Progressing toward goals    Frequency    7X/week      PT Plan Current plan remains appropriate    Co-evaluation              AM-PAC PT "6 Clicks" Daily Activity  Outcome Measure  Difficulty turning over in bed (including adjusting bedclothes, sheets and blankets)?: A Little Difficulty moving from lying on  back to sitting on the side of the bed? : A Little Difficulty sitting down on and standing up from a chair with arms (e.g., wheelchair, bedside commode, etc,.)?: A Little Help needed moving to and from a bed to chair (including a wheelchair)?: A Little Help needed walking in hospital room?: A Little Help needed climbing 3-5 steps with a railing? : A Lot 6 Click Score: 17    End of Session   Activity Tolerance: Patient tolerated treatment well Patient left: in bed Nurse Communication: Mobility status PT Visit Diagnosis: Unsteadiness on feet (R26.81)      Time: 1747-1595 PT Time Calculation (min) (ACUTE ONLY): 28 min  Charges:  $Gait Training: 8-22 mins $Therapeutic Exercise: 8-22 mins                    G CodesTresa Endo PT [phone]    Claretha Cooper 03/23/2018, 11:33 AM

## 2018-03-23 NOTE — Progress Notes (Signed)
   Subjective: 1 Day Post-Op Procedure(s) (LRB): LEFT TOTAL HIP ARTHROPLASTY ANTERIOR APPROACH (Left) Patient reports pain as mild.   Patient seen in rounds with Dr. Wynelle Link. Patient is well, and has had no acute complaints or problems. No SOB or chest pain. No issues overnight.  We will start therapy today.  Plan is to go Home after hospital stay.  Objective: Vital signs in last 24 hours: Temp:  [97.5 F (36.4 C)-98.2 F (36.8 C)] 98 F (36.7 C) (07/18 0555) Pulse Rate:  [67-90] 67 (07/18 0555) Resp:  [10-18] 16 (07/18 0555) BP: (109-162)/(61-94) 131/75 (07/18 0555) SpO2:  [97 %-100 %] 99 % (07/18 0555) Weight:  [77.1 kg (170 lb)] 77.1 kg (170 lb) (07/17 1047)  Intake/Output from previous day:  Intake/Output Summary (Last 24 hours) at 03/23/2018 0738 Last data filed at 03/23/2018 0647 Gross per 24 hour  Intake 3727.5 ml  Output 3295 ml  Net 432.5 ml     Labs: Recent Labs    03/23/18 0603  HGB 11.7*   Recent Labs    03/23/18 0603  WBC 10.5  RBC 3.75*  HCT 35.6*  PLT 311   Recent Labs    03/23/18 0603  NA 137  K 4.2  CL 102  CO2 26  BUN 10  CREATININE 0.61  GLUCOSE 178*  CALCIUM 9.1    EXAM General - Patient is Alert and Oriented Extremity - Neurologically intact Intact pulses distally Dorsiflexion/Plantar flexion intact Compartment soft Dressing - dressing C/D/I Motor Function - intact, moving foot and toes well on exam.  Hemovac pulled without difficulty.  Past Medical History:  Diagnosis Date  . Anxiety   . Arthritis    OA hips  . Asthma    allergy and exercise induced asthma   . Depression   . Diabetes mellitus without complication ([place])   . Family history of breast cancer   . Family history of colon cancer   . Family history of stomach cancer   . GERD (gastroesophageal reflux disease)   . Hypertension   . Malignant neoplasm of lower-outer quadrant of right female breast ([place]) 06/21/2016  . Seasonal allergies      Assessment/Plan: 1 Day Post-Op Procedure(s) (LRB): LEFT TOTAL HIP ARTHROPLASTY ANTERIOR APPROACH (Left) Active Problems:   OA (osteoarthritis) of hip  Estimated body mass index is 30.6 kg/m as calculated from the following:   Height as of this encounter: 5' 2.5" (1.588 m).   Weight as of this encounter: 77.1 kg (170 lb). Advance diet Up with therapy D/C IV fluids when tolerating POs well  DVT Prophylaxis - Xarelto Weight Bearing As Tolerated  Hemovac Pulled Begin Therapy  Will have her work with therapy this morning. Plan DC home with HHPT if she is meeting her goals. Follow up in 2 weeks.   Ardeen Jourdain, PA-C Orthopaedic Surgery 03/23/2018, 7:38 AM

## 2018-03-24 LAB — GLUCOSE, CAPILLARY
Glucose-Capillary: 280 mg/dL — ABNORMAL HIGH (ref 70–99)
Glucose-Capillary: 145 mg/dL — ABNORMAL HIGH (ref 70–99)
Glucose-Capillary: 148 mg/dL — ABNORMAL HIGH (ref 70–99)

## 2018-05-10 ENCOUNTER — Encounter: Payer: Self-pay | Admitting: Family Medicine

## 2018-05-10 ENCOUNTER — Ambulatory Visit: Payer: BC Managed Care – PPO | Admitting: Family Medicine

## 2018-05-10 ENCOUNTER — Other Ambulatory Visit: Payer: Self-pay

## 2018-05-10 VITALS — BP 124/62 | HR 86 | Temp 98.8°F | Resp 14 | Ht 63.0 in | Wt 170.0 lb

## 2018-05-10 DIAGNOSIS — J4520 Mild intermittent asthma, uncomplicated: Secondary | ICD-10-CM

## 2018-05-10 DIAGNOSIS — J069 Acute upper respiratory infection, unspecified: Secondary | ICD-10-CM

## 2018-05-10 MED ORDER — ALBUTEROL SULFATE HFA 108 (90 BASE) MCG/ACT IN AERS: 2.0000 | INHALATION_SPRAY | Freq: Four times a day (QID) | RESPIRATORY_TRACT | 2 refills | Status: DC | PRN

## 2018-05-10 MED ORDER — IPRATROPIUM BROMIDE 0.03 % NA SOLN: 2.0000 | Freq: Two times a day (BID) | NASAL | 0 refills | Status: DC

## 2018-05-10 MED ORDER — BENZONATATE 100 MG PO CAPS: 100.0000 mg | ORAL_CAPSULE | Freq: Three times a day (TID) | ORAL | 0 refills | Status: DC | PRN

## 2018-05-10 MED ORDER — PREDNISONE 20 MG PO TABS
40.0000 mg | ORAL_TABLET | Freq: Every day | ORAL | 0 refills | Status: AC
Start: 2018-05-10 — End: 2018-05-15

## 2018-05-10 NOTE — Patient Instructions (Addendum)
Continue Xyzal and nasal medicines  Continue mucinex  Can try sudafed and atrovent nose spray  If infection goes to chest with coughing and wheezing, use your rescue inhaler as needed every 4 hours and start the oral steroid  Follow up if getting worse.  Antibiotics for sinus infection are indicated if you get sudden worsening of sinus pain or if symptoms go more than 8-10 days - call and we can start this if indicated.

## 2018-05-10 NOTE — Progress Notes (Signed)
Patient ID: Carolyn Glass, female    DOB: November 14, 1955, 62 y.o.   MRN: [id number]  PCP: [name], MD  Chief Complaint  Patient presents with  . Illness    x3 days- head congestion, nasal drianage, nonproductive cough, low grade fever, ear pressure    Subjective:   Carolyn Glass is a 62 y.o. female, presents to clinic with CC of three days of head and chest congestion with non-productive cough that [place] after having fans pointed at her all night.  She has nasal drainage and post nasal drip, cough is non-productive.  She reports associated subjective fever and sweats at onset that have resolved.  Her ears are bothering her as well, they have pressure and would not equalize when she drove down the mountain.  No improvement with OTC meds - mucinex yesterday morning.  Pt states her cough is mild, no SOB, wheeze, CP.  She feels mildly ill but denies concerning fatigue or any exertional sx.  Most severe pain is sinus pain and pressure, currently moderate, has gradually worsened, worse to cheeks.  No HA's.  She is still take xyzal nightly, has used nasal chrome in the past.  She has a hx of asthma, managed only with rescue inhaler/SABA and she denies chest tightness, asthma exacerbation, she has njot used inhaler Mildly loose stool, no other sx.  Denies H/C chills, weightloss, appetite change, back pain, neck pain, abd pain.  No sick contacts.   Patient Active Problem List   Diagnosis Date Noted  . Special screening for malignant neoplasms, colon   . Osteopenia 11/16/2016  . Genetic testing 07/09/2016  . Family history of breast cancer   . Family history of colon cancer   . Family history of stomach cancer   . Malignant neoplasm of lower-outer quadrant of right female breast ([place]) 06/21/2016  . OA (osteoarthritis) of hip 01/07/2016     Prior to Admission medications   Medication Sig Start Date End Date Taking? Authorizing Provider  albuterol  (PROVENTIL HFA;VENTOLIN HFA) 108 (90 Base) MCG/ACT inhaler Inhale 2 puffs into the lungs every 6 (six) hours as needed for wheezing or shortness of breath.   Yes [provider]  benazepril (LOTENSIN) 20 MG tablet TAKE (1) TABLET BY MOUTH TWICE DAILY. 01/18/18  Yes [name], MD  diclofenac sodium (VOLTAREN) 1 % GEL Apply 4 g topically 4 (four) times daily. Patient taking differently: Apply 4 g topically 4 (four) times daily as needed (pain).  02/16/18  Yes [name], MD  escitalopram (LEXAPRO) 20 MG tablet TAKE ONE TABLET BY MOUTH ONCE DAILY. 12/26/17  Yes [name], MD  exemestane (AROMASIN) 25 MG tablet Take 1 tablet (25 mg total) by mouth daily after breakfast. 12/12/17  Yes [name], MD  levocetirizine (XYZAL) 5 MG tablet Take 5 mg by mouth at bedtime.   Yes [provider]  metFORMIN (GLUCOPHAGE) 1000 MG tablet TAKE 1 TABLET BY MOUTH TWICE DAILY WITH MEALS. 02/21/18  Yes [name], MD  omeprazole (PRILOSEC) 20 MG capsule TAKE (1) CAPSULE BY MOUTH ONCE DAILY. 11/23/17  Yes [name], MD     Allergies  Allergen Reactions  . Corn-Containing Products Itching    Running nose  . Tomato Itching    Running nose   . Azithromycin Itching and Rash  . Biaxin [Clarithromycin] Rash  . Codeine Rash    Thinks a codeine cough syrup caused rash.  Family History  Problem Relation Age of Onset  . Arthritis Mother   . Asthma Mother   . Diabetes Mother   . Hyperlipidemia Mother   . Hypertension Mother   . Arthritis Father   . Asthma Father   . Stroke Father   . Hyperlipidemia Father   . Early death Brother 8       Muscular Dystrophy  . Birth defects Brother   . Cancer Maternal Uncle        possibly stomach  . Mental retardation Maternal Uncle   . Cancer Paternal Grandmother 40       breast cancer   . Cancer Cousin 66       paternal cousin's daughter with breast cancer  . Colon cancer Maternal Aunt        dx in her 31s  . Leukemia  Paternal Uncle   . Lymphoma Paternal Uncle   . Down syndrome Paternal Uncle      Social History   Socioeconomic History  . Marital status: Married    Spouse name: Not on file  . Number of children: 1  . Years of education: Not on file  . Highest education level: Not on file  Occupational History  . Not on file  Social Needs  . Financial resource strain: Not on file  . Food insecurity:    Worry: Not on file    Inability: Not on file  . Transportation needs:    Medical: Not on file    Non-medical: Not on file  Tobacco Use  . Smoking status: Never Smoker  . Smokeless tobacco: Never Used  Substance and Sexual Activity  . Alcohol use: No  . Drug use: No  . Sexual activity: Yes  Lifestyle  . Physical activity:    Days per week: Not on file    Minutes per session: Not on file  . Stress: Not on file  Relationships  . Social connections:    Talks on phone: Not on file    Gets together: Not on file    Attends religious service: Not on file    Active member of club or organization: Not on file    Attends meetings of clubs or organizations: Not on file    Relationship status: Not on file  . Intimate partner violence:    Fear of current or ex partner: Not on file    Emotionally abused: Not on file    Physically abused: Not on file    Forced sexual activity: Not on file  Other Topics Concern  . Not on file  Social History Narrative  . Not on file     Review of Systems  Constitutional: Negative.   HENT: Negative.   Eyes: Negative.   Respiratory: Negative.   Cardiovascular: Negative.   Gastrointestinal: Negative.   Endocrine: Negative.   Genitourinary: Negative.   Musculoskeletal: Negative.   Skin: Negative.   Allergic/Immunologic: Negative.   Neurological: Negative.   Hematological: Negative.   Psychiatric/Behavioral: Negative.   10 Systems reviewed and are negative for acute change except as noted in the HPI.      Objective:    Vitals:   05/10/18 1006    BP: 124/62  Pulse: 86  Resp: 14  Temp: 98.8 F (37.1 C)  TempSrc: Oral  SpO2: 96%  Weight: 170 lb (77.1 kg)  Height: 5\' 3"  (1.6 m)      Physical Exam  Constitutional: She appears well-developed and well-nourished. No distress.  HENT:  Head:  Normocephalic and atraumatic.  Right Ear: Tympanic membrane, external ear and ear canal normal.  Left Ear: Tympanic membrane, external ear and ear canal normal.  Nose: Mucosal edema and rhinorrhea present. Right sinus exhibits no maxillary sinus tenderness and no frontal sinus tenderness. Left sinus exhibits no maxillary sinus tenderness and no frontal sinus tenderness.  Mouth/Throat: Uvula is midline and mucous membranes are normal. Mucous membranes are not pale and not dry. Posterior oropharyngeal erythema present. No oropharyngeal exudate, posterior oropharyngeal edema or tonsillar abscesses.  Eyes: Pupils are equal, round, and reactive to light. Conjunctivae and EOM are normal. Right eye exhibits no discharge. Left eye exhibits no discharge.  Neck: Normal range of motion. Neck supple. No tracheal deviation present.  Cardiovascular: Normal rate, regular rhythm, normal heart sounds and intact distal pulses. Exam reveals no gallop and no friction rub.  No murmur heard. Pulmonary/Chest: Effort normal and breath sounds normal. No accessory muscle usage or stridor. No tachypnea. No respiratory distress. She has no decreased breath sounds. She has no wheezes. She has no rhonchi. She has no rales.  Abdominal: Soft. Bowel sounds are normal. She exhibits no distension. There is no tenderness.  Musculoskeletal: Normal range of motion.  Lymphadenopathy:    She has no cervical adenopathy.  Neurological: She is alert. She exhibits normal muscle tone. Coordination normal.  Skin: Skin is warm and dry. No rash noted. She is not diaphoretic.  Psychiatric: She has a normal mood and affect. Her behavior is normal.  Nursing note and vitals reviewed.          Assessment & Plan:      ICD-10-CM   1. Upper respiratory tract infection, unspecified type J06.9 ipratropium (ATROVENT) 0.03 % nasal spray    benzonatate (TESSALON) 100 MG capsule  2. Mild intermittent asthma without complication H41.74 predniSONE (DELTASONE) 20 MG tablet    albuterol (PROVENTIL HFA;VENTOLIN HFA) 108 (90 Base) MCG/ACT inhaler    62 y/o female with hx of asthma presents with head and chest congestion x 3 days, appears consistent with URI.  Pt denies wheeze/SOB or use of SABA since becoming ill.  No current physical exam findings concerning for bacterial infections.  Supportive tx for viral illness.  Refilled albuterol inhaler, pt believes hers is likely expired.  Pt seems to have uncomplicated asthma, no frequent exacerbations and no past admissions for asthma exacerbations.  Going into weekend in 2 days, discussed with pt the need to start oral steroids if she experiences asthma attacks using inhaler frequently or with incomplete relief with inhaler, so prednisone 5 d burst printed to hold.  Follow up with any acute worsening.   Delsa Grana, PA-C 05/10/18 10:14 AM

## 2018-05-19 ENCOUNTER — Other Ambulatory Visit: Payer: Self-pay | Admitting: Family Medicine

## 2018-05-19 ENCOUNTER — Other Ambulatory Visit: Payer: Self-pay | Admitting: Hematology

## 2018-05-19 DIAGNOSIS — C50511 Malignant neoplasm of lower-outer quadrant of right female breast: Secondary | ICD-10-CM

## 2018-05-19 DIAGNOSIS — Z170 Estrogen receptor positive status [ER+]: Principal | ICD-10-CM

## 2018-05-23 LAB — HM DIABETES EYE EXAM

## 2018-06-08 NOTE — Progress Notes (Signed)
Carolyn Glass  [phone] [phone]  Clinic Follow Up Note   Patient Care Team: Carolyn Frizzle, MD as PCP - General (Family Medicine) Carolyn Seltzer, MD as Consulting Physician (General Surgery) Carolyn Merle, MD as Consulting Physician (Hematology) Carolyn Gibson, MD as Attending Physician (Radiation Oncology) Carolyn Phlegm, NP as Nurse Practitioner (Hematology and Oncology) 06/14/2018   CHIEF COMPLAINTS:  Follow up for right multifocal breast cancer  Oncology History   Malignant neoplasm of lower-outer quadrant of right female breast [facility])   Staging form: Breast, AJCC 7th Edition   - Clinical stage from 06/23/2016: Stage IA (T1c, N0, M0) - Unsigned      Malignant neoplasm of lower-outer quadrant of right female breast ([place])   04/28/2015 Imaging    Bone Density Report 04/28/2015 AP Spine (L1-L4): T-Score = -1.7. Osteopenia Femoral Neck (Left) : T-Score = -1.0 Normal Femoral Neck (Right): T-Score = 0.4 Normal    03/01/2016 Mammogram    Bilateral screening mammogram was negative    06/08/2016 Imaging    Bilateral breast MRI with and without contrast showed 4 lesions in the right breast, 2 adjacent 1.4 cm mass in the inferior aspect of the right breast, and in the right lateral aspect of the breast, there are 8 mm and a 7 mm irregular enhancing mass. Multiple small enhancing lesions within the liver, indeterminate. No axillary adenopathy.    06/16/2016 Initial Diagnosis    Malignant neoplasm of lower-outer quadrant of right female breast ([place])    06/16/2016 Initial Biopsy    Right breast needle core biopsy at 9 clock, 6:30 clock, 6 clock showed invasive lobular carcinoma, and LCIS    06/16/2016 Receptors her2    two right breast biopsy showed ER strongly positive, PR strongly positive, HER-2 negative, Ki-67 5-10%     06/16/2016 Imaging    Right breast and axilla ultrasound showed a 1.4 cm mass at the 6:30 o'clock, 1.0 cm mass  at the 6:00, and 11m mass at 9:00 position. Axilla was negative.    06/28/2016 Imaging    MRI ABDOMEN W W/O CONTRAST 06/28/2016 IMPRESSION: 1. Multiple hepatic hemangiomas.  No suspicious liver lesion. 2. Hepatic steatosis. 3. No evidence of abdominal metastasis.    06/28/2016 Genetic Testing    Patient has genetic testing done for breast cancer. Results revealed patient has the following variant called, MSH2 c.2650A>T.    08/13/2016 Surgery    Bilateral simple mastectomy and right SLN biopsy      08/13/2016 Pathology Results    Right mastectomy showed invasive ductal carcinoma, G2, multifocal, 1.5cm, 1.5cm, and 1.0cm. (+) LCIS, margins are negative, 1 sentinel lymph node was negative. Left simple mastectomy showed LCIS, ADH, no malignancy.     08/13/2016 Oncotype testing    Oncotype recurrence score 11, which predicts 10 year distant recurrence risk of 7% with tamoxifen alone, low risk category. No adjuvant chemo recommended based on this.     09/25/2016 -  Anti-estrogen oral therapy    Adjuvant exemestane 258mdaily     11/05/2016 Surgery    REMOVAL OF BILATERAL TISSUE EXPANDERS WITH PLACEMENT OF BILATERAL BREAST SALINE IMPLANTS by Dr. ThIran Glass    HISTORY OF PRESENTING ILLNESS: (06/23/2016) PaForbes Cellar62.o. female is here because of her newly diagnosed right breast cancer. She is accompanied by her husband to our multidisciplinary clinic today.  [facility] which was negative for malignancy. She also had right breast lumpectomy in  2009 for benign lesion. Her last screening mammogram was negative in June 2017. Due to her family history of breast cancer, her primary care physician felt she is at high risk for breast cancer, and referred her for breast MRI on 06/08/2016, which showed 4 small lesions in the right breast. She underwent ultrasound of the right breast and axilla, which showed 2 adjacent breast mass at 6:00 position, and a smaller mass at 9  clock position, axillary was negative. All of the 3 right breast lesions were biopsied under ultrasound guidance, which all showed invasive lobular carcinoma and LCIS. He appears dry positive, HER-2 negative.  She feels well, denies any significant pain, except arthritis pain in the left hip, manageable. She has good appetite and energy level, works full-time at [employer]. She is married, lives with her husband. Her paternal grandmother and paternal cousin had breast cancer in their 60s.  GYN HISTORY  Menarchal: 11  LMP: 62 yo  Contraceptive: no  HRT: no  G5P1: one daughter 63 yo   CURRENT THERAPY: Exemestane (Aromasin) 25 mg daily started on September 25, 2016  INTERVAL HISTORY:  Carolyn Glass returns for follow up. She was last see by me 6 months ago. She had total hip arthroplasty on 03/22/2018. She has seen Dr. Lucio Glass last month, when she was noted to have chest congestion and dry cough.  Today, she is here alone at the clinic. She retired. She states that her father passed away in 02/05/2023, and she is currently taking care of her mother. She is tolerating Exemestane well with mild tolerable hot flashes. She experiences mild joint pain and stiffness especially in the morning, but is over all tolerable. She denies abdominal pain, loss of appetite. She is purposefully losing weight. She doesn't exercise very often.   MEDICAL HISTORY:  Past Medical History:  Diagnosis Date  . Anxiety   . Arthritis    OA hips  . Asthma    allergy and exercise induced asthma   . Depression   . Diabetes mellitus without complication ([place])   . Family history of breast cancer   . Family history of colon cancer   . Family history of stomach cancer   . GERD (gastroesophageal reflux disease)   . Hypertension   . Malignant neoplasm of lower-outer quadrant of right female breast ([place]) 06/21/2016  . Seasonal allergies     SURGICAL HISTORY: Past Surgical History:  Procedure Laterality Date  . BREAST  LUMPECTOMY     right   . BREAST RECONSTRUCTION WITH PLACEMENT OF TISSUE EXPANDER AND FLEX HD (ACELLULAR HYDRATED DERMIS) Bilateral 08/13/2016   Procedure: BREAST RECONSTRUCTION WITH PLACEMENT OF TISSUE EXPANDER AND ALLODERM;  Surgeon: Irene Limbo, MD;  Location: [place];  Service: Plastics;  Laterality: Bilateral;  . COLONOSCOPY N/A 10/31/2017   Procedure: COLONOSCOPY;  Surgeon: Danie Binder, MD;  [facility];  Service: Endoscopy;  Laterality: N/A;  8:30 Am  . DILATION AND CURETTAGE OF UTERUS    . JOINT REPLACEMENT    . LIPOSUCTION WITH LIPOFILLING Bilateral 11/05/2016   Procedure: LIPOFILLING FROM ABDOMEN TO BILATERAL CHEST;  Surgeon: Irene Limbo, MD;  Location: [place];  Service: Plastics;  Laterality: Bilateral;  . MASTECTOMY W/ SENTINEL NODE BIOPSY Bilateral 08/13/2016   Procedure: BILATERAL TOTAL MASTECTOMY WITH RIGHT AXILLARY SENTINEL LYMPH NODE BIOPSY;  Surgeon: Carolyn Seltzer, MD;  Location: [place];  Service: General;  Laterality: Bilateral;  . REMOVAL OF BILATERAL TISSUE EXPANDERS  WITH PLACEMENT OF BILATERAL BREAST IMPLANTS Bilateral 11/05/2016   Procedure: REMOVAL OF BILATERAL TISSUE EXPANDERS WITH PLACEMENT OF BILATERAL BREAST SALINE IMPLANTS;  Surgeon: Irene Limbo, MD;  Location: [place];  Service: Plastics;  Laterality: Bilateral;  . TOTAL HIP ARTHROPLASTY Right 01/07/2016   Procedure: TOTAL HIP ARTHROPLASTY ANTERIOR APPROACH;  Surgeon: Gaynelle Arabian, MD;  Location: [facility];  Service: Orthopedics;  Laterality: Right;  . TOTAL HIP ARTHROPLASTY Left 03/22/2018   Procedure: LEFT TOTAL HIP ARTHROPLASTY ANTERIOR APPROACH;  Surgeon: Gaynelle Arabian, MD;  Location: [facility];  Service: Orthopedics;  Laterality: Left;    SOCIAL HISTORY: Social History   Socioeconomic History  . Marital status: Married    Spouse name: Not on file  . Number of children: 1  . Years of education: Not on file  .  Highest education level: Not on file  Occupational History  . Not on file  Social Needs  . Financial resource strain: Not on file  . Food insecurity:    Worry: Not on file    Inability: Not on file  . Transportation needs:    Medical: Not on file    Non-medical: Not on file  Tobacco Use  . Smoking status: Never Smoker  . Smokeless tobacco: Never Used  Substance and Sexual Activity  . Alcohol use: No  . Drug use: No  . Sexual activity: Yes  Lifestyle  . Physical activity:    Days per week: Not on file    Minutes per session: Not on file  . Stress: Not on file  Relationships  . Social connections:    Talks on phone: Not on file    Gets together: Not on file    Attends religious service: Not on file    Active member of club or organization: Not on file    Attends meetings of clubs or organizations: Not on file    Relationship status: Not on file  . Intimate partner violence:    Fear of current or ex partner: Not on file    Emotionally abused: Not on file    Physically abused: Not on file    Forced sexual activity: Not on file  Other Topics Concern  . Not on file  Social History Narrative  . Not on file    FAMILY HISTORY: Family History  Problem Relation Age of Onset  . Arthritis Mother   . Asthma Mother   . Diabetes Mother   . Hyperlipidemia Mother   . Hypertension Mother   . Arthritis Father   . Asthma Father   . Stroke Father   . Hyperlipidemia Father   . Early death Brother 8       Muscular Dystrophy  . Birth defects Brother   . Cancer Maternal Uncle        possibly stomach  . Mental retardation Maternal Uncle   . Cancer Paternal Grandmother 55       breast cancer   . Cancer Cousin 41       paternal cousin's daughter with breast cancer  . Colon cancer Maternal Aunt        dx in her 40s  . Leukemia Paternal Uncle   . Lymphoma Paternal Uncle   . Down syndrome Paternal Uncle     ALLERGIES:  is allergic to corn-containing products; tomato;  azithromycin; biaxin [clarithromycin]; and codeine.  MEDICATIONS:  Current Outpatient Medications  Medication Sig Dispense Refill  . benazepril (LOTENSIN) 20 MG tablet TAKE (1) TABLET BY MOUTH TWICE  DAILY. 60 tablet 0  . diclofenac sodium (VOLTAREN) 1 % GEL Apply 4 g topically 4 (four) times daily. (Patient taking differently: Apply 4 g topically 4 (four) times daily as needed (pain). ) 100 g 1  . escitalopram (LEXAPRO) 20 MG tablet TAKE ONE TABLET BY MOUTH ONCE DAILY. 90 tablet 2  . exemestane (AROMASIN) 25 MG tablet TAKE ONE TABLET BY MOUTH ONCE DAILY AFTER BREAKFAST. 90 tablet 3  . levocetirizine (XYZAL) 5 MG tablet Take 5 mg by mouth at bedtime.    . metFORMIN (GLUCOPHAGE) 1000 MG tablet TAKE 1 TABLET BY MOUTH TWICE DAILY WITH MEALS. 180 tablet 0  . omeprazole (PRILOSEC) 20 MG capsule TAKE (1) CAPSULE BY MOUTH ONCE DAILY. 90 capsule 3   No current facility-administered medications for this visit.     REVIEW OF SYSTEMS:  Constitutional: Denies fevers, chills or abnormal night sweats (+) hot flashes, tolerable Eyes: Denies blurriness of vision, double vision or watery eyes Ears, nose, mouth, throat, and face: Denies mucositis or sore throat Respiratory: Denies cough, dyspnea or wheezes Cardiovascular: Denies palpitation, chest discomfort or lower extremity swelling Gastrointestinal:  Denies nausea, heartburn or change in bowel habits Skin: Denies abnormal skin rashes Lymphatics: Denies new lymphadenopathy or easy bruising Neurological:Denies numbness, tingling or new weaknesses MSK: (+) osteoporsis in right hip and joint, right wrist pain, finger stiffness, related to exemestane  Behavioral/Psych: No new mood changes All other systems were reviewed with the patient and are negative.  PHYSICAL EXAMINATION: ECOG PERFORMANCE STATUS: 0 - Asymptomatic  Vitals:   06/14/18 1430  BP: 119/75  Pulse: 70  Resp: 18  Temp: 98.4 F (36.9 C)  SpO2: 98%   Filed Weights   06/14/18 1430    Weight: 168 lb 14.4 oz (76.6 kg)    GENERAL:alert, no distress and comfortable SKIN: skin color, texture, turgor are normal, no rashes or significant lesions EYES: normal, conjunctiva are pink and non-injected, sclera clear OROPHARYNX:no exudate, no erythema and lips, buccal mucosa, and tongue normal. NECK: supple, thyroid normal size, non-tender, without nodularity LYMPH:  no palpable lymphadenopathy in the axillary or inguinal (+) mild cervical lymphadenopathy, not concerning of malignancy LUNGS: clear to auscultation and percussion with normal breathing effort HEART: regular rate & rhythm and no murmurs and no lower extremity edema ABDOMEN:abdomen soft, non-tender and normal bowel sounds. No hepatomegaly  Musculoskeletal:no cyanosis of digits and no clubbing  PSYCH: alert & oriented x 3 with fluent speech NEURO: no focal motor/sensory deficits  Breasts: (+) Bilateral mastectomy with implants, symmetrical. Incisions healed well. No palpable mass.   LABORATORY DATA:  I have reviewed the data as listed CBC Latest Ref Rng & Units 06/14/2018 03/23/2018 03/13/2018  WBC 4.0 - 10.5 K/uL 5.4 10.5 5.4  Hemoglobin 12.0 - 15.0 g/dL 12.5 11.7(L) 12.4  Hematocrit 36.0 - 46.0 % 38.5 35.6(L) 37.5  Platelets 150 - 400 K/uL 305 311 341   CMP Latest Ref Rng & Units 06/14/2018 03/23/2018 03/13/2018  Glucose 70 - 99 mg/dL 84 178(H) 126(H)  BUN 8 - 23 mg/dL 12 10 15   Creatinine 0.44 - 1.00 mg/dL 0.74 0.61 0.69  Sodium 135 - 145 mmol/L 141 137 141  Potassium 3.5 - 5.1 mmol/L 4.3 4.2 4.2  Chloride 98 - 111 mmol/L 104 102 105  CO2 22 - 32 mmol/L 27 26 28   Calcium 8.9 - 10.3 mg/dL 9.7 9.1 9.0  Total Protein 6.5 - 8.1 g/dL 7.3 - 7.1  Total Bilirubin 0.3 - 1.2 mg/dL 0.4 - 0.3  Alkaline  Phos 38 - 126 U/L 121 - 93  AST 15 - 41 U/L 19 - 21  ALT 0 - 44 U/L 20 - 20   PATHOLOGY REPORT  ONCOTYPE DX 08/13/2016   Diagnosis 08/13/2016 1. Breast, simple mastectomy, Left - LOBULAR NEOPLASIA (LOBULAR CARCINOMA IN  SITU). - ATYPICAL DUCTAL HYPERPLASIA. - FLAT EPITHELIAL ATYPIA. - FIBROCYSTIC CHANGES WITH ADENOSIS AND CALCIFICATIONS. 2. Breast, simple mastectomy, Right - INVASIVE LOBULAR CARCINOMA, GRADE II/III, MULTIPLE FOCI, SPANNING 1.5 CM, 1.5 CM AND 1.0 CM. - LOBULAR CARCINOMA IN SITU. - THE SURGICAL RESECTION MARGINS ARE NEGATIVE FOR INVASIVE CARCINOMA. - SEE ONCOLOGY TABLE BELOW. 3. Lymph node, sentinel, biopsy, Right - THERE IS NO EVIDENCE OF CARCINOMA IN 1 OF 1 LYMPH NODE (0/1). - SEE COMMENT. Microscopic Comment 2. BREAST, INVASIVE TUMOR, WITH LYMPH NODES PRESENT Specimen, including laterality and lymph node sampling (sentinel, non-sentinel): Right breast and right axillary lymph node. Procedure: Simple mastectomy and axillary lymph node resection x1. Histologic type: Lobular Grade: II Tubule formation: 3 Nuclear pleomorphism: 2 Mitotic:2 Tumor size (gross measurements): 1.5 cm, 1.5 cm, and 1.0 cm Margins: Greater than 0.2 cm to all margins. Lymphovascular invasion: Not identified. Ductal carcinoma in situ: Not identified. Tumor focality: At least three foci. Extent of tumor: Confined to breast parenchyma. Lymph nodes: 1 of 3 FINAL for Carolyn Glass, Carolyn Glass [phone]) Microscopic Comment(continued) Examined: 1 Sentinel 0 Non-sentinel 1 Total Lymph nodes with metastasis: 0 Breast prognostic profile: Estrogen receptor: Positive. Progesterone receptor: Positive. Her 2 neu: Negative. Ki-67: 5% - 10% Non-neoplastic breast: Fibrocystic changes with adenosis and calcifications. TNM: mpT1c, pN0 Comments: There are three nodules of similar appearing invasive lobular carcinoma with associated lobular carcinoma in situ. The carcinoma cells are negative for E-cadherin, supporting a lobular phenotype. 3. Immunohistochemical stains performed on two blocks of part 3 fail to highlight the presence of cytokeratin positive tumor cells. (JBK:ecj 08/17/2016) Enid Cutter MD Pathologist,  Electronic Signature (Case signed 08/17/2016)  Genetic Testing 06/28/2016 Genetic testing did identify a variant called, MSH2 c.2650A>T.  Diagnosis 06/16/2016 1. Breast, right, needle core biopsy, 9 o'clock - INVASIVE LOBULAR CARCINOMA. - LOBULAR CARCINOMA IN SITU. - SEE COMMENT. 2. Breast, right, needle core biopsy, 6:30 o'clock - INVASIVE LOBULAR CARCINOMA. - LOBULAR CARCINOMA IN SITU. - SEE COMMENT. 3. Breast, right, needle core biopsy, 6 o'clock - INVASIVE LOBULAR CARCINOMA. - LOBULAR CARCINOMA IN SITU. - SEE COMMENT. Microscopic Comment 1. Smooth muscle myosin, p63 and calponin stains are performed on the first specimen (right 9 o'clock needle core biopsy). The stains confirm the presence of both invasive and in situ carcinoma. An E-cadherin immunohistochemical stain is also performed. This additional stain is negative in both the invasive and in situ components confirming a lobular phenotype. Although definitive grading of breast carcinoma is best done on excision, the features of the invasive tumor from the right 9 o'clock breast biopsy are compatible with a grade 2 breast carcinoma. Breast prognostic markers will be performed and reported in an addendum. Findings are called to the [facility] on 06/18/16. Dr. Vicente Males has seen this specimen in consultation with agreement. 2. Smooth muscle myosin, p63 and calponin stains are performed on the second specimen (right 6:30 o'clock needle core biopsy). The stains confirm the presence of both invasive and in situ carcinoma. An E-cadherin immunohistochemical stain is also performed. This additional stain is negative in both the invasive and in situ components confirming a lobular phenotype. Although definitive grading of breast carcinoma is best done on excision, the features  of the invasive tumor from the right 6:30 o'clock biopsy are compatible with a grade 2 breast carcinoma. Breast prognostic markers will be  performed and reported in an addendum. Findings are called to the [facility] on 06/18/16. Dr. Vicente Males has seen this specimen in consultation with agreement. 3. An E-cadherin immunohistochemical stain is performed on the third specimen (right 6 o'clock breast biopsy). The stain fails to demonstrate positivity in the invasive or in situ component confirming the lobular nature of both. Although definitive grading of breast carcinoma is best done on excision, the features of the invasive tumor from the right 6 o'clock breast biopsy are compatible with a grade 2 breast carcinoma. Breast prognostic markers will be performed and reported in an addendum. Findings are called to the [facility] on 06/18/16. Dr. Vicente Males has seen this specimen in consultation with agreement. (RAH:gt, 06/18/16) Willeen Niece MD  1. Results: IMMUNOHISTOCHEMICAL AND MORPHOMETRIC ANALYSIS PERFORMED MANUALLY Estrogen Receptor: 95%, POSITIVE, STRONG STAINING INTENSITY Progesterone Receptor: 95%, POSITIVE, STRONG STAINING INTENSITY Proliferation Marker Ki67: 10%  Results: HER2 - NEGATIVE RATIO OF HER2/CEP17 SIGNALS 1.16 AVERAGE HER2 COPY NUMBER PER CELL 2.20  2. PROGNOSTIC INDICATORS Insufficient tissue   3. PROGNOSTIC INDICATORS Results: IMMUNOHISTOCHEMICAL AND MORPHOMETRIC ANALYSIS PERFORMED MANUALLY Estrogen Receptor: 100%, POSITIVE, STRONG STAINING INTENSITY Progesterone Receptor: 100%, POSITIVE, STRONG STAINING INTENSITY Proliferation Marker Ki67: 5% Results: HER2 - NEGATIVE RATIO OF HER2/CEP17 SIGNALS 1.19 AVERAGE HER2 COPY NUMBER PER CELL 3.15   RADIOGRAPHIC STUDIES: I have personally reviewed the radiological images as listed and agreed with the findings in the report. No results found.  ASSESSMENT & PLAN:  62 y.o. Caucasian postmenopausal woman, presented image discovered multifocal right breast masses.  1. Malignant neoplasm of lower-outer quadrant of right  female breast, invasive lobular carcinoma and LCIS, mpT1cN0M0, stage IA, G2, ER+/PR+/HER2-, oncotype low risk  -I reviewed her surgical pathology findings with patient in details. Her surgical margins was negative, 1 sentinel lymph nodes was negative. She had stage I disease. -Oncotype DX score was 11 with the 10 year risk of distant recurrence with tamoxifen alone is 7%. This is at low risk. Therefore I do not recommend chemotherapy.  -she is on adjuvant exemestane. Plan for total 5-7 years. -She had a bilateral mastectomy, no routine mammogram needed -The patient underwent reconstruction with saline implants on 11/05/16 by Dr. Iran Glass. -continue healthy diet and exercise regularly. -We'll continue breast cancer surveillance with routine lab, exam and follow-up -She is tolerating exemestane well overall, with mild arthralgia, tolerable.  I encouraged her exercise. -She is clinically doing well, her breast exam was unremarkable, her CBC is within normal limits. CMP is pending. There is no clinical concern for recurrence.  -She will discuss getting a bone scan with her GYN. I advised her to take calcium and vitamin D.  -F/u in 6 months   2. Genetics -She has 2 family members who had breast cancer at a young age, she will qualify for genetic testing, we'll refer her. -Blood sample sent on 06/28/16. -Genetic testing did identify a VUS in MSH2, c.2650A>T.  3. DM, type 2 -She is on metformin now, her energy sugar was 282 previously, she does not control her diet. -I previously advised the patient to check her glucose at home. -previously Advised low carb diet and exercise. -She'll follow-up with her primary care physician. -Sugar has improved. I previously suggested she continue to watch her diet and exercise to control her DM.  -I advised her to  f/u with her PCP  4. Arthritis and arthralgia -She had a total right hip replacement, has mild arthralgia in the left hip, manageable. -We previously  discussed that adjuvant hormonal therapy could make this worse. -Her pain has been tolerable with tylenol so far. I suggested she tries to remain active with stationary bike and exercise.   5. Osteopenia  -Bone scan 04/28/15 revealed AP Spine (L1-L4) T-Score = -1.7. -We discussed that exemestane may weak bone -I previously advised the patient to take Calcium and vitamin D. Will check her Vit D level yearly  -previously Advised the patient to exercise to strengthen her bones. -Due to elevated calcium seen 02/16/17 I suggested she hold calcium and vitamin D to monitor her levels and drink plenty of water.  -I advised her to drink plenty of water, put more diary into her diet and take vitamin D. She is fine to not to take anymore supplemental calcium.  -She is due for bone scan this year, she will set this up with her GYN.     PLAN -Continue Exemestane, refill today  -Lab and f/u in 6 months -she will call her GYN to schedule a DEXA scan   No orders of the defined types were placed in this encounter.   All questions were answered. The patient knows to call the clinic with any problems, questions or concerns. I spent 15 minutes counseling the patient face to face. The total time spent in the appointment was 20 minutes and more than 50% was on counseling.   Dierdre Searles Dweik am acting as scribe for Dr. Truitt Glass.  I have reviewed the above documentation for accuracy and completeness, and I agree with the above.     Carolyn Merle, MD 06/14/2018

## 2018-06-13 ENCOUNTER — Encounter: Payer: Self-pay | Admitting: Family Medicine

## 2018-06-13 ENCOUNTER — Other Ambulatory Visit: Payer: Self-pay

## 2018-06-13 ENCOUNTER — Ambulatory Visit: Payer: BC Managed Care – PPO | Admitting: Family Medicine

## 2018-06-13 VITALS — BP 126/80 | HR 82 | Temp 98.1°F | Resp 14 | Ht 63.0 in | Wt 169.0 lb

## 2018-06-13 DIAGNOSIS — K1120 Sialoadenitis, unspecified: Secondary | ICD-10-CM | POA: Diagnosis not present

## 2018-06-13 NOTE — Patient Instructions (Addendum)
Will get ultrasound and treat with instructions on the hand outs.     Parotitis Parotitis means that you have irritation and swelling (inflammation) in one or both of your parotid glands. These glands make spit (saliva). They are found on each side of your face, below and in front of your earlobes. You may or may not have pain with this condition. Follow these instructions at home: Medicines  Take over-the-counter and prescription medicines only as told by your doctor.  If you were prescribed an antibiotic medicine, take it as told by your doctor. Do not stop taking the antibiotic even if you start to feel better. Managing pain and swelling  Apply warm cloths (compresses) to the swollen area as told by your doctor.  Gently rub your parotid glands as told by your doctor. General instructions   Drink enough fluid to keep your pee (urine) clear or pale yellow.  Suck on sour candy. This may help: ? To make your mouth less dry. ? To make more spit.  Keep your mouth clean and moist. ? Gargle with a salt-water mixture 3-4 times per day, or as needed. ? To make a salt-water mixture, stir -1 tsp of salt into 1 cup of warm water.  Take good care of your mouth: ? Brush your teeth at least two times per day. ? Floss your teeth every day. ? See your dentist regularly.  Do not use tobacco products. These include cigarettes, chewing tobacco, or e-cigarettes. If you need help quitting,  ask your doctor.  Keep all follow-up visits as told by your doctor. This is important. Contact a doctor if:  You have a fever or chills.  You have new symptoms.  Your symptoms get worse.  Your symptoms do not get better with treatment. This information is not intended to replace advice given to you by your health care provider. Make sure you discuss any questions you have with your health care provider. Document Released: 09/25/2010 Document Revised: 01/29/2016 Document Reviewed:  01/16/2015 Elsevier Interactive Patient Education  [employer].

## 2018-06-13 NOTE — Progress Notes (Signed)
Patient ID: Carolyn Glass, female    DOB: May 26, 1956, 62 y.o.   MRN: [id number]  PCP: [name], MD  Chief Complaint  Patient presents with  . Swelling in R Sided Jaw    x10 days- side of jaw tingles and then swells while eating    Subjective:   Carolyn Glass is a 62 y.o. female, presents to clinic with CC of left jaw swelling, onset Friday while drinking a coke, it swells over left   Swollen jaw and parotid gland area that occurred Friday with an acute onset while she was drinking soda, has gradually improved but still is swollen and very mildly tender, asymmetrical when compared to her right side.  She has had this recur several times always occurs when eating food.  She denies any redness associated with it, she currently has no fever, no trismus, no other swollen lymph nodes.  She has never had this worked up because typically it resolves.  No recent illness, no trismus, swelling of tongue lips or airway, no difficulty swallowing or speaking, no neck pain or neck stiffness, no ear pain, no change to her voice.  Patient Active Problem List   Diagnosis Date Noted  . Special screening for malignant neoplasms, colon   . Osteopenia 11/16/2016  . Genetic testing 07/09/2016  . Family history of breast cancer   . Family history of colon cancer   . Family history of stomach cancer   . Malignant neoplasm of lower-outer quadrant of right female breast ([place]) 06/21/2016  . OA (osteoarthritis) of hip 01/07/2016     Prior to Admission medications   Medication Sig Start Date End Date Taking? Authorizing Provider  albuterol (PROVENTIL HFA;VENTOLIN HFA) 108 (90 Base) MCG/ACT inhaler Inhale 2 puffs into the lungs every 6 (six) hours as needed for wheezing or shortness of breath. 05/10/18  Yes Delsa Grana, PA-C  benazepril (LOTENSIN) 20 MG tablet TAKE (1) TABLET BY MOUTH TWICE DAILY. 01/18/18  Yes [name], MD  benzonatate (TESSALON) 100 MG capsule Take 1 capsule (100 mg  total) by mouth 3 (three) times daily as needed for cough. 05/10/18  Yes Delsa Grana, PA-C  diclofenac sodium (VOLTAREN) 1 % GEL Apply 4 g topically 4 (four) times daily. Patient taking differently: Apply 4 g topically 4 (four) times daily as needed (pain).  02/16/18  Yes [name], MD  escitalopram (LEXAPRO) 20 MG tablet TAKE ONE TABLET BY MOUTH ONCE DAILY. 12/26/17  Yes [name], MD  exemestane (AROMASIN) 25 MG tablet TAKE ONE TABLET BY MOUTH ONCE DAILY AFTER BREAKFAST. 05/19/18  Yes [name], MD  ipratropium (ATROVENT) 0.03 % nasal spray Place 2 sprays into both nostrils every 12 (twelve) hours. 05/10/18  Yes Delsa Grana, PA-C  levocetirizine (XYZAL) 5 MG tablet Take 5 mg by mouth at bedtime.   Yes [provider]  metFORMIN (GLUCOPHAGE) 1000 MG tablet TAKE 1 TABLET BY MOUTH TWICE DAILY WITH MEALS. 05/19/18  Yes [name], MD  omeprazole (PRILOSEC) 20 MG capsule TAKE (1) CAPSULE BY MOUTH ONCE DAILY. 11/23/17  Yes [name], MD     Allergies  Allergen Reactions  . Corn-Containing Products Itching    Running nose  . Tomato Itching    Running nose   . Azithromycin Itching and Rash  . Biaxin [Clarithromycin] Rash  . Codeine Rash    Thinks a codeine cough syrup caused rash.     Family History  Problem Relation Age of  Onset  . Arthritis Mother   . Asthma Mother   . Diabetes Mother   . Hyperlipidemia Mother   . Hypertension Mother   . Arthritis Father   . Asthma Father   . Stroke Father   . Hyperlipidemia Father   . Early death Brother 8       Muscular Dystrophy  . Birth defects Brother   . Cancer Maternal Uncle        possibly stomach  . Mental retardation Maternal Uncle   . Cancer Paternal Grandmother 9       breast cancer   . Cancer Cousin 17       paternal cousin's daughter with breast cancer  . Colon cancer Maternal Aunt        dx in her 27s  . Leukemia Paternal Uncle   . Lymphoma Paternal Uncle   . Down syndrome Paternal Uncle        Social History   Socioeconomic History  . Marital status: Married    Spouse name: Not on file  . Number of children: 1  . Years of education: Not on file  . Highest education level: Not on file  Occupational History  . Not on file  Social Needs  . Financial resource strain: Not on file  . Food insecurity:    Worry: Not on file    Inability: Not on file  . Transportation needs:    Medical: Not on file    Non-medical: Not on file  Tobacco Use  . Smoking status: Never Smoker  . Smokeless tobacco: Never Used  Substance and Sexual Activity  . Alcohol use: No  . Drug use: No  . Sexual activity: Yes  Lifestyle  . Physical activity:    Days per week: Not on file    Minutes per session: Not on file  . Stress: Not on file  Relationships  . Social connections:    Talks on phone: Not on file    Gets together: Not on file    Attends religious service: Not on file    Active member of club or organization: Not on file    Attends meetings of clubs or organizations: Not on file    Relationship status: Not on file  . Intimate partner violence:    Fear of current or ex partner: Not on file    Emotionally abused: Not on file    Physically abused: Not on file    Forced sexual activity: Not on file  Other Topics Concern  . Not on file  Social History Narrative  . Not on file     Review of Systems  Constitutional: Negative.  Negative for activity change, appetite change, chills, diaphoresis, fatigue and fever.  HENT: Negative.  Negative for congestion, dental problem, drooling, ear discharge, ear pain, facial swelling, hearing loss, mouth sores, nosebleeds, postnasal drip, rhinorrhea, sinus pressure, sinus pain, sore throat, trouble swallowing and voice change.   Eyes: Negative.   Respiratory: Negative.  Negative for cough, shortness of breath and wheezing.   Cardiovascular: Negative.  Negative for chest pain, palpitations and leg swelling.  Gastrointestinal: Negative.   Negative for abdominal pain, diarrhea, nausea and vomiting.  Endocrine: Negative.   Genitourinary: Negative.   Musculoskeletal: Negative.  Negative for arthralgias, myalgias, neck pain and neck stiffness.  Skin: Negative.  Negative for color change, pallor and rash.  Allergic/Immunologic: Negative.   Neurological: Negative.  Negative for dizziness, light-headedness and headaches.  Hematological: Negative.  Negative for adenopathy.  Psychiatric/Behavioral: Negative.   All other systems reviewed and are negative.      Objective:    Vitals:   06/13/18 1200  BP: 126/80  Pulse: 82  Resp: 14  Temp: 98.1 F (36.7 C)  TempSrc: Oral  SpO2: 98%  Weight: 169 lb (76.7 kg)  Height: 5\' 3"  (1.6 m)      Physical Exam  Constitutional: Vital signs are normal. She appears well-developed and well-nourished.  Non-toxic appearance. She does not have a sickly appearance. She does not appear ill. No distress.  HENT:  Head: Normocephalic and atraumatic. Head is without right periorbital erythema and without left periorbital erythema.    Right Ear: Tympanic membrane, external ear and ear canal normal.  Left Ear: Tympanic membrane, external ear and ear canal normal.  Nose: No mucosal edema or rhinorrhea. Right sinus exhibits no maxillary sinus tenderness and no frontal sinus tenderness. Left sinus exhibits no maxillary sinus tenderness and no frontal sinus tenderness.  Mouth/Throat: Uvula is midline, oropharynx is clear and moist and mucous membranes are normal. Mucous membranes are not pale and not dry. No oral lesions. No trismus in the jaw. Normal dentition. No uvula swelling. No oropharyngeal exudate, posterior oropharyngeal edema, posterior oropharyngeal erythema or tonsillar abscesses. No tonsillar exudate.  Graphic for area of mild fullness and very mild tenderness, no induration or erythema of the skin, the area is visibly enlarged and asymmetrical when compared to the right mandible  angle Intraoral cavity inspected and palpated, no sublingual lymphadenopathy or tenderness, no visualized purulence from any salivary glands no visualized salivary stones, no gum or bugle tenderness to palpation, edema, erythema, fluctuance or induration, no suspected dental caries or dental abscesses  Eyes: Pupils are equal, round, and reactive to light. Conjunctivae and EOM are normal. Right eye exhibits no discharge. Left eye exhibits no discharge. No scleral icterus.  Neck: Normal range of motion. Neck supple. No tracheal deviation present.  Cardiovascular: Normal rate and regular rhythm.  Pulmonary/Chest: Effort normal. No stridor. No respiratory distress.  Abdominal: Soft. Bowel sounds are normal.  Musculoskeletal: Normal range of motion.  Lymphadenopathy:       Head (right side): No submental, no submandibular, no tonsillar, no preauricular, no posterior auricular and no occipital adenopathy present.       Head (left side): No submental, no submandibular, no tonsillar, no preauricular, no posterior auricular and no occipital adenopathy present.    She has no cervical adenopathy.       Right cervical: No superficial cervical, no deep cervical and no posterior cervical adenopathy present.      Left cervical: No superficial cervical, no deep cervical and no posterior cervical adenopathy present.  Neurological: She is alert. She exhibits normal muscle tone. Coordination normal.  Skin: Skin is warm and dry. Capillary refill takes less than 2 seconds. No rash noted. She is not diaphoretic.  Psychiatric: She has a normal mood and affect. Her behavior is normal.  Nursing note and vitals reviewed.         Assessment & Plan:      ICD-10-CM   1. Sialadenitis K11.20 US Soft Tissue Head/Neck    Swollen jaw and parotid gland area that occurred Friday with an acute onset while she was drinking soda, has gradually improved but still is swollen and very mildly tender, asymmetrical when compared to  her right side.  She has had this recur several times always occurs when eating food.  She denies any redness associated with it, she currently has  no fever, no trismus, no other swollen lymph nodes.  She has never had this worked up because typically it resolves.  Do suspect sialoadenitis or prostatitis, will evaluate with ultrasound but do think if it is recurrent that she may benefit from specialized evaluation from ENT.  Currently there is very very slight tenderness and patient does not appear ill I do not believe that she currently needs antibiotics it may be viral or a very gland obstruction, have given her instructions on gentle massage, applying heat, using sour candies to stimulate saliva, and will obtain ultrasound.  She is instructed to contact us right away with any worsening in size with fever or new erythema, we will cover with Augmentin at that point   Delsa Grana, PA-C 06/13/18 12:19 PM

## 2018-06-14 ENCOUNTER — Other Ambulatory Visit: Payer: Self-pay | Admitting: Family Medicine

## 2018-06-14 ENCOUNTER — Encounter: Payer: Self-pay | Admitting: Hematology

## 2018-06-14 ENCOUNTER — Telehealth: Payer: Self-pay | Admitting: Hematology

## 2018-06-14 ENCOUNTER — Inpatient Hospital Stay: Payer: BC Managed Care – PPO | Attending: Hematology | Admitting: Hematology

## 2018-06-14 ENCOUNTER — Inpatient Hospital Stay: Payer: BC Managed Care – PPO

## 2018-06-14 VITALS — BP 119/75 | HR 70 | Temp 98.4°F | Resp 18 | Ht 63.0 in | Wt 168.9 lb

## 2018-06-14 DIAGNOSIS — C50511 Malignant neoplasm of lower-outer quadrant of right female breast: Secondary | ICD-10-CM | POA: Diagnosis not present

## 2018-06-14 DIAGNOSIS — M8580 Other specified disorders of bone density and structure, unspecified site: Secondary | ICD-10-CM | POA: Diagnosis not present

## 2018-06-14 DIAGNOSIS — Z170 Estrogen receptor positive status [ER+]: Secondary | ICD-10-CM

## 2018-06-14 DIAGNOSIS — Z23 Encounter for immunization: Secondary | ICD-10-CM | POA: Insufficient documentation

## 2018-06-14 DIAGNOSIS — Z803 Family history of malignant neoplasm of breast: Secondary | ICD-10-CM

## 2018-06-14 DIAGNOSIS — E119 Type 2 diabetes mellitus without complications: Secondary | ICD-10-CM | POA: Diagnosis not present

## 2018-06-14 DIAGNOSIS — M8588 Other specified disorders of bone density and structure, other site: Secondary | ICD-10-CM

## 2018-06-14 DIAGNOSIS — Z79811 Long term (current) use of aromatase inhibitors: Secondary | ICD-10-CM

## 2018-06-14 DIAGNOSIS — I10 Essential (primary) hypertension: Secondary | ICD-10-CM | POA: Diagnosis not present

## 2018-06-14 DIAGNOSIS — Z79899 Other long term (current) drug therapy: Secondary | ICD-10-CM | POA: Diagnosis not present

## 2018-06-14 DIAGNOSIS — Z421 Encounter for breast reconstruction following mastectomy: Secondary | ICD-10-CM | POA: Insufficient documentation

## 2018-06-14 DIAGNOSIS — Z7984 Long term (current) use of oral hypoglycemic drugs: Secondary | ICD-10-CM | POA: Diagnosis not present

## 2018-06-14 DIAGNOSIS — K1120 Sialoadenitis, unspecified: Secondary | ICD-10-CM

## 2018-06-14 DIAGNOSIS — F329 Major depressive disorder, single episode, unspecified: Secondary | ICD-10-CM

## 2018-06-14 DIAGNOSIS — F419 Anxiety disorder, unspecified: Secondary | ICD-10-CM | POA: Diagnosis not present

## 2018-06-14 DIAGNOSIS — Z9013 Acquired absence of bilateral breasts and nipples: Secondary | ICD-10-CM | POA: Diagnosis not present

## 2018-06-14 LAB — COMPREHENSIVE METABOLIC PANEL
ALT: 20 U/L (ref 0–44)
ANION GAP: 10 (ref 5–15)
AST: 19 U/L (ref 15–41)
Albumin: 4.1 g/dL (ref 3.5–5.0)
Alkaline Phosphatase: 121 U/L (ref 38–126)
BILIRUBIN TOTAL: 0.4 mg/dL (ref 0.3–1.2)
BUN: 12 mg/dL (ref 8–23)
CO2: 27 mmol/L (ref 22–32)
Calcium: 9.7 mg/dL (ref 8.9–10.3)
Chloride: 104 mmol/L (ref 98–111)
Creatinine, Ser: 0.74 mg/dL (ref 0.44–1.00)
GFR calc Af Amer: >60 mL/min (ref >60)
Glucose, Bld: 84 mg/dL (ref 70–99)
POTASSIUM: 4.3 mmol/L (ref 3.5–5.1)
Sodium: 141 mmol/L (ref 135–145)
TOTAL PROTEIN: 7.3 g/dL (ref 6.5–8.1)

## 2018-06-14 LAB — CBC WITH DIFFERENTIAL/PLATELET
ABS IMMATURE GRANULOCYTES: 0.01 10*3/uL (ref 0.00–0.07)
BASOS PCT: 0 %
Basophils Absolute: 0 10*3/uL (ref 0.0–0.1)
Eosinophils Absolute: 0.2 10*3/uL (ref 0.0–0.5)
Eosinophils Relative: 4 %
HCT: 38.5 % (ref 36.0–46.0)
Hemoglobin: 12.5 g/dL (ref 12.0–15.0)
Immature Granulocytes: 0 %
Lymphocytes Relative: 38 %
Lymphs Abs: 2.1 10*3/uL (ref 0.7–4.0)
MCH: 29.8 pg (ref 26.0–34.0)
MCHC: 32.5 g/dL (ref 30.0–36.0)
MCV: 91.9 fL (ref 80.0–100.0)
MONO ABS: 0.5 10*3/uL (ref 0.1–1.0)
Monocytes Relative: 9 %
NEUTROS ABS: 2.6 10*3/uL (ref 1.7–7.7)
NEUTROS PCT: 49 %
PLATELETS: 305 10*3/uL (ref 150–400)
RBC: 4.19 MIL/uL (ref 3.87–5.11)
RDW: 13.3 % (ref 11.5–15.5)
WBC: 5.4 10*3/uL (ref 4.0–10.5)
nRBC: 0 % (ref 0.0–0.2)

## 2018-06-14 MED ORDER — EXEMESTANE 25 MG PO TABS: ORAL_TABLET | ORAL | 3 refills | Status: DC

## 2018-06-14 MED ORDER — INFLUENZA VAC SPLIT QUAD 0.5 ML IM SUSY
PREFILLED_SYRINGE | INTRAMUSCULAR | Status: AC
Start: 2018-06-14 — End: ?
  Filled 2018-06-14: qty 0.5

## 2018-06-14 MED ORDER — INFLUENZA VAC SPLIT QUAD 0.5 ML IM SUSY
0.5000 mL | PREFILLED_SYRINGE | Freq: Once | INTRAMUSCULAR | Status: AC
  Administered 2018-06-14: 0.5 mL via INTRAMUSCULAR

## 2018-06-14 NOTE — Telephone Encounter (Signed)
Scheduled appt per 10/9 los - gave patient AVS and calender per los.   

## 2018-06-19 ENCOUNTER — Other Ambulatory Visit: Payer: Self-pay | Admitting: Family Medicine

## 2018-06-20 ENCOUNTER — Ambulatory Visit (HOSPITAL_COMMUNITY)
Admission: RE | Admit: 2018-06-20 | Discharge: 2018-06-20 | Disposition: A | Discharge status: Home / Self Care | Payer: BC Managed Care – PPO | Source: Ambulatory Visit | Attending: Family Medicine | Admitting: Family Medicine

## 2018-06-20 DIAGNOSIS — K1120 Sialoadenitis, unspecified: Secondary | ICD-10-CM

## 2018-06-21 ENCOUNTER — Other Ambulatory Visit: Payer: Self-pay | Admitting: *Deleted

## 2018-06-21 DIAGNOSIS — K1120 Sialoadenitis, unspecified: Secondary | ICD-10-CM

## 2018-08-18 ENCOUNTER — Other Ambulatory Visit: Payer: Self-pay | Admitting: Family Medicine

## 2018-09-18 ENCOUNTER — Other Ambulatory Visit: Payer: Self-pay | Admitting: Family Medicine

## 2018-10-16 ENCOUNTER — Other Ambulatory Visit: Payer: Self-pay | Admitting: Family Medicine

## 2018-10-16 DIAGNOSIS — K219 Gastro-esophageal reflux disease without esophagitis: Secondary | ICD-10-CM

## 2018-11-13 ENCOUNTER — Other Ambulatory Visit: Payer: Self-pay | Admitting: Family Medicine

## 2018-12-11 ENCOUNTER — Telehealth: Payer: Self-pay | Admitting: Hematology

## 2018-12-11 NOTE — Telephone Encounter (Signed)
Per 4/2 schedule message cancelled 4/9 lab and updated office visit to be via telephone. Spoke with patient.

## 2018-12-13 NOTE — Progress Notes (Signed)
[place]   [phone] [phone]   Clinic Follow up Note   Patient Care Team: [name], MD as PCP - General (Family Medicine) [name], MD as Consulting Physician (General Surgery) [name], MD as Consulting Physician (Hematology) [name], MD as Attending Physician (Radiation Oncology) [name], NP as Nurse Practitioner (Hematology and Oncology)   I connected with [name] on 12/14/2018 at  2:15 PM EDT by telephone and verified that I am speaking with the correct person using two identifiers.   I discussed the limitations, risks, security and privacy concerns of performing an evaluation and management service by telephone and the availability of in person appointments. I also discussed with the patient that there may be a patient responsible charge related to this service. The patient expressed understanding and agreed to proceed.   Patient's location:  At her daughter's home  Provider's location:  My Office  CHIEF COMPLAINT: Follow up for right multifocal breast cancer  SUMMARY OF ONCOLOGIC HISTORY: Oncology History   Malignant neoplasm of lower-outer quadrant of right female breast [facility])   Staging form: Breast, AJCC 7th Edition   - Clinical stage from 06/23/2016: Stage IA (T1c, N0, M0) - Unsigned      Malignant neoplasm of lower-outer quadrant of right female breast ([place])   04/28/2015 Imaging    Bone Density Report 04/28/2015 AP Spine (L1-L4): T-Score = -1.7. Osteopenia Femoral Neck (Left) : T-Score = -1.0 Normal Femoral Neck (Right): T-Score = 0.4 Normal    03/01/2016 Mammogram    Bilateral screening mammogram was negative    06/08/2016 Imaging    Bilateral breast MRI with and without contrast showed 4 lesions in the right breast, 2 adjacent 1.4 cm mass in the inferior aspect of the right breast, and in the right lateral aspect of the breast, there are 8 mm and a 7 mm irregular enhancing  mass. Multiple small enhancing lesions within the liver, indeterminate. No axillary adenopathy.    06/16/2016 Initial Diagnosis    Malignant neoplasm of lower-outer quadrant of right female breast ([place])    06/16/2016 Initial Biopsy    Right breast needle core biopsy at 9 clock, 6:30 clock, 6 clock showed invasive lobular carcinoma, and LCIS    06/16/2016 Receptors her2    two right breast biopsy showed ER strongly positive, PR strongly positive, HER-2 negative, Ki-67 5-10%     06/16/2016 Imaging    Right breast and axilla ultrasound showed a 1.4 cm mass at the 6:30 o'clock, 1.0 cm mass at the 6:00, and 18m mass at 9:00 position. Axilla was negative.    06/28/2016 Imaging    MRI ABDOMEN W W/O CONTRAST 06/28/2016 IMPRESSION: 1. Multiple hepatic hemangiomas.  No suspicious liver lesion. 2. Hepatic steatosis. 3. No evidence of abdominal metastasis.    06/28/2016 Genetic Testing    Patient has genetic testing done for breast cancer. Results revealed patient has the following variant called, MSH2 [id number].    08/13/2016 Surgery    Bilateral simple mastectomy and right SLN biopsy      08/13/2016 Pathology Results    Right mastectomy showed invasive ductal carcinoma, G2, multifocal, 1.5cm, 1.5cm, and 1.0cm. (+) LCIS, margins are negative, 1 sentinel lymph node was negative. Left simple mastectomy showed LCIS, ADH, no malignancy.     08/13/2016 Oncotype testing    Oncotype recurrence score 11, which predicts 10 year distant recurrence risk of 7% with tamoxifen alone, low risk category. No adjuvant chemo recommended based  on this.     09/25/2016 -  Anti-estrogen oral therapy    Adjuvant exemestane 23m daily     11/05/2016 Surgery    REMOVAL OF BILATERAL TISSUE EXPANDERS WITH PLACEMENT OF BILATERAL BREAST SALINE IMPLANTS by Dr. TIran Planas      CURRENT THERAPY:  Exemestane (Aromasin) 25 mg daily started on September 25, 2016, Plan for 5-7 years.   INTERVAL HISTORY:  PCHELCIE ESTORGAis  here for a follow up of right breast cancer. She was last seen by me 6 months ago. She was able to identify herself by birth date. She notes she is doing well. She denies any new changes in the last 6 months. She notes no pain lately. She notes her appetite and energy is adequate and doing well.  She notes she has been taking exemestane with night and morning sweats. This does not effect her sleep much. She notes finger joint pain and left wrists. This is exacerbated by picking up objects. She is still able to function adequately.  She has not had a DEXA lately. She will check with her Gyn for an image. I reviewed her medication list with her. Her BG and HTN has been doing well.    REVIEW OF SYSTEMS:   Constitutional: Denies fevers, chills or abnormal weight loss (+) night and morning sweats Eyes: Denies blurriness of vision Ears, nose, mouth, throat, and face: Denies mucositis or sore throat Respiratory: Denies cough, dyspnea or wheezes Cardiovascular: Denies palpitation, chest discomfort or lower extremity swelling Gastrointestinal:  Denies nausea, heartburn or change in bowel habits Skin: Denies abnormal skin rashes MSK: (+) joint pain in b/l fingers and left wrist Lymphatics: Denies new lymphadenopathy or easy bruising Neurological:Denies numbness, tingling or new weaknesses Behavioral/Psych: Mood is stable, no new changes  All other systems were reviewed with the patient and are negative.  MEDICAL HISTORY:  Past Medical History:  Diagnosis Date  . Anxiety   . Arthritis    OA hips  . Asthma    allergy and exercise induced asthma   . Depression   . Diabetes mellitus without complication ([place]   . Family history of breast cancer   . Family history of colon cancer   . Family history of stomach cancer   . GERD (gastroesophageal reflux disease)   . Hypertension   . Malignant ne[place] of [place] quadrant of right female breast ([place] 06/21/2016  . Seasonal allergies      SURGICAL HISTORY: Past Surgical History:  Procedure Laterality Date  . BREAST LUMPECTOMY     right   . BREAST RECONSTRUCTION WITH PLACEMENT OF TISSUE EXPANDER AND FLEX HD (ACELLULAR HYDRATED DERMIS) Bilateral 08/13/2016   Procedure: BREAST RECONSTRUCTION WITH PLACEMENT OF TISSUE EXPANDER AND ALLODERM;  Surgeon: BIrene Limbo MD;  [place]  Service: Plastics;  Laterality: Bilateral;  . COLONOSCOPY N/A 10/31/2017   Procedure: COLONOSCOPY;  Surgeon: FDanie Binder MD;  [facility];  Service: Endoscopy;  Laterality: N/A;  8:30 Am  . DILATION AND CURETTAGE OF UTERUS    . JOINT REPLACEMENT    . LIPOSUCTION WITH LIPOFILLING Bilateral 11/05/2016   Procedure: LIPOFILLING FROM ABDOMEN TO BILATERAL CHEST;  Surgeon: BIrene Limbo MD;  [facility]  Service: Plastics;  Laterality: Bilateral;  . MASTECTOMY W/ SENTINEL NODE BIOPSY Bilateral 08/13/2016   Procedure: BILATERAL TOTAL MASTECTOMY WITH RIGHT AXILLARY SENTINEL LYMPH NODE BIOPSY;  Surgeon: BExcell Seltzer MD;  [place] [facility]  Service: General;  Laterality: Bilateral;  . REMOVAL OF BILATERAL TISSUE EXPANDERS WITH PLACEMENT OF BILATERAL BREAST IMPLANTS Bilateral 11/05/2016   Procedure: REMOVAL OF BILATERAL TISSUE EXPANDERS WITH PLACEMENT OF BILATERAL BREAST SALINE IMPLANTS;  Surgeon: Irene Limbo, MD;  Location: [place];  Service: Plastics;  Laterality: Bilateral;  . TOTAL HIP ARTHROPLASTY Right 01/07/2016   Procedure: TOTAL HIP ARTHROPLASTY ANTERIOR APPROACH;  Surgeon: Gaynelle Arabian, MD;  [facility];  Service: Orthopedics;  Laterality: Right;  . TOTAL HIP ARTHROPLASTY Left 03/22/2018   Procedure: LEFT TOTAL HIP ARTHROPLASTY ANTERIOR APPROACH;  Surgeon: Gaynelle Arabian, MD;  [facility];  Service: Orthopedics;  Laterality: Left;    I have reviewed the social history and family history with the patient and they are unchanged from  previous note.  ALLERGIES:  is allergic to corn-containing products; tomato; azithromycin; biaxin [clarithromycin]; and codeine.  MEDICATIONS:  Current Outpatient Medications  Medication Sig Dispense Refill  . benazepril (LOTENSIN) 20 MG tablet TAKE (1) TABLET BY MOUTH TWICE DAILY. 180 tablet 3  . diclofenac sodium (VOLTAREN) 1 % GEL Apply 4 g topically 4 (four) times daily. (Patient taking differently: Apply 4 g topically 4 (four) times daily as needed (pain). ) 100 g 1  . escitalopram (LEXAPRO) 20 MG tablet TAKE ONE TABLET BY MOUTH ONCE DAILY. 90 tablet 0  . exemestane (AROMASIN) 25 MG tablet TAKE ONE TABLET BY MOUTH ONCE DAILY AFTER BREAKFAST. 90 tablet 3  . levocetirizine (XYZAL) 5 MG tablet Take 5 mg by mouth at bedtime.    . metFORMIN (GLUCOPHAGE) 1000 MG tablet TAKE 1 TABLET BY MOUTH TWICE DAILY WITH MEALS. 180 tablet 0  . omeprazole (PRILOSEC) 20 MG capsule TAKE (1) CAPSULE BY MOUTH ONCE DAILY. 90 capsule 3   No current facility-administered medications for this visit.     PHYSICAL EXAMINATION: ECOG PERFORMANCE STATUS: 0 - Asymptomatic  -No vitals taken today   Exam not performed   LABORATORY DATA:  I have reviewed the data as listed CBC Latest Ref Rng & Units 06/14/2018 03/23/2018 03/13/2018  WBC 4.0 - 10.5 K/uL 5.4 10.5 5.4  Hemoglobin 12.0 - 15.0 g/dL 12.5 11.7(L) 12.4  Hematocrit 36.0 - 46.0 % 38.5 35.6(L) 37.5  Platelets 150 - 400 K/uL 305 311 341     CMP Latest Ref Rng & Units 06/14/2018 03/23/2018 03/13/2018  Glucose 70 - 99 mg/dL 84 178(H) 126(H)  BUN 8 - 23 mg/dL 12 10 15   Creatinine 0.44 - 1.00 mg/dL 0.74 0.61 0.69  Sodium 135 - 145 mmol/L 141 137 141  Potassium 3.5 - 5.1 mmol/L 4.3 4.2 4.2  Chloride 98 - 111 mmol/L 104 102 105  CO2 22 - 32 mmol/L 27 26 28   Calcium 8.9 - 10.3 mg/dL 9.7 9.1 9.0  Total Protein 6.5 - 8.1 g/dL 7.3 - 7.1  Total Bilirubin 0.3 - 1.2 mg/dL 0.4 - 0.3  Alkaline Phos 38 - 126 U/L 121 - 93  AST 15 - 41 U/L 19 - 21  ALT 0 - 44 U/L 20 - 20       RADIOGRAPHIC STUDIES: I have personally reviewed the radiological images as listed and agreed with the findings in the report. No results found.   ASSESSMENT & PLAN:  Carolyn Glass is a 63 y.o. female with   1. Malignant neoplasm of lower-outer quadrant of right female breast, invasive lobular carcinoma and LCIS, [id number], stage IA, G2, ER+/PR+/HER2-, oncotype low risk  -She was diagnosed in 06/2016. She is s/p b/l mastectomy and breast reconstruction  surgery.  -Oncotype DX score was 11 with the 10 year risk of distant recurrence with tamoxifen alone is 7%. This is at low risk. Therefore I do not recommend chemotherapy.  Given she was node negative post-mastectomy radiation was not recommended.  -She has been on anti-estrogen therapy with exemestane since 09/2016. Plan for total 5-7 years. Tolerating well with manageable hot flush.  -She is clinically doing well and stable. There is no clinical concern for recurrence. She had b/l mastectomy she does not need mammograms -Continue Exemestane  -F/u in 4 months with lab    2. Genetics -Genetic testing did identify a VUS in MSH2, c.2650A>T.  3. DM, type 2 -On metformin and Lotensin -I advised her to f/u with her PCP  4. Arthritis and arthralgia -Mild joint pain in b/l fingers and left wrists, manageable   5. Osteopenia  -Bone scan 04/28/15 revealed AP Spine (L1-L4) T-Score = -1.7. -We discussed that exemestane may weak bone -Since 02/16/17 elevated calcium level, she is has been advised to drink plenty of water, put more diary into her diet and take vitamin D. She is fine to not to take anymore supplemental calcium.  -She is overdue for bone scan, she will set this up with her GYN this summer.    PLAN -Continue Exemestane, refill today  -Lab and f/u in 4 months -she will call her GYN to schedule a DEXA scan this summer    No problem-specific Assessment & Plan notes found for this encounter.   No orders of the  defined types were placed in this encounter.  All questions were answered. The patient knows to call the clinic with any problems, questions or concerns. No barriers to learning was detected. I spent 7 minutes counseling the patient over the phone. The total time spent in the appointment was 10 minutes and more than 50% was on counseling and review of test results     [name], MD 12/14/2018   I, Joslyn Devon, am acting as scribe for [name], MD.   I have reviewed the above documentation for accuracy and completeness, and I agree with the above.

## 2018-12-14 ENCOUNTER — Inpatient Hospital Stay: Payer: BC Managed Care – PPO

## 2018-12-14 ENCOUNTER — Encounter: Payer: Self-pay | Admitting: Hematology

## 2018-12-14 ENCOUNTER — Inpatient Hospital Stay: Payer: BC Managed Care – PPO | Attending: Hematology | Admitting: Hematology

## 2018-12-14 DIAGNOSIS — Z79811 Long term (current) use of aromatase inhibitors: Secondary | ICD-10-CM

## 2018-12-14 DIAGNOSIS — M8588 Other specified disorders of bone density and structure, other site: Secondary | ICD-10-CM

## 2018-12-14 DIAGNOSIS — C50511 Malignant neoplasm of lower-outer quadrant of right female breast: Secondary | ICD-10-CM | POA: Diagnosis not present

## 2018-12-14 DIAGNOSIS — Z800 Family history of malignant neoplasm of digestive organs: Secondary | ICD-10-CM

## 2018-12-14 DIAGNOSIS — Z803 Family history of malignant neoplasm of breast: Secondary | ICD-10-CM

## 2018-12-14 DIAGNOSIS — E119 Type 2 diabetes mellitus without complications: Secondary | ICD-10-CM

## 2018-12-14 DIAGNOSIS — I10 Essential (primary) hypertension: Secondary | ICD-10-CM

## 2018-12-14 DIAGNOSIS — Z170 Estrogen receptor positive status [ER+]: Secondary | ICD-10-CM | POA: Diagnosis not present

## 2018-12-14 DIAGNOSIS — Z79899 Other long term (current) drug therapy: Secondary | ICD-10-CM

## 2018-12-14 DIAGNOSIS — Z7984 Long term (current) use of oral hypoglycemic drugs: Secondary | ICD-10-CM

## 2018-12-15 ENCOUNTER — Telehealth: Payer: Self-pay | Admitting: Hematology

## 2018-12-15 NOTE — Telephone Encounter (Signed)
Scheduled appt per 4/9 los. °

## 2018-12-27 ENCOUNTER — Other Ambulatory Visit: Payer: Self-pay | Admitting: Family Medicine

## 2019-02-16 ENCOUNTER — Other Ambulatory Visit: Payer: Self-pay | Admitting: Family Medicine

## 2019-02-27 ENCOUNTER — Other Ambulatory Visit: Payer: Self-pay

## 2019-02-27 ENCOUNTER — Ambulatory Visit (INDEPENDENT_AMBULATORY_CARE_PROVIDER_SITE_OTHER): Payer: BC Managed Care – PPO | Admitting: Family Medicine

## 2019-02-27 DIAGNOSIS — Z20828 Contact with and (suspected) exposure to other viral communicable diseases: Secondary | ICD-10-CM | POA: Diagnosis not present

## 2019-02-27 DIAGNOSIS — Z20822 Contact with and (suspected) exposure to covid-19: Secondary | ICD-10-CM

## 2019-02-27 NOTE — Progress Notes (Signed)
Subjective:    Patient ID: Carolyn Glass, female    DOB: 07-27-1956, 63 y.o.   MRN: [id number]  HPI Patient is a very pleasant 63 year old Caucasian female who is requesting COVID testing.  Her husband has a cough and has a low-grade fever.  He was tested for COVID yesterday at the hospital.  His test results are supposed to be back first thing in the morning.  She has actually not been around her husband for the last few weeks.  Instead she has been staying with her mother and caring for her.  She has no other potential exposure other than through her husband.  She has no symptoms.  She has no shortness of breath.  She has no cough.  She denies any sore throat.  She denies any fatigue or fever.  She denies any runny nose or change in her sense of smell or taste.  She denies any diarrhea.  Past medical history is significant for breast cancer status post double mastectomy.  She is on an aromatase inhibitor for the next 5 years but otherwise is not on chemotherapy.  She does not smoke. Past Medical History:  Diagnosis Date  . Anxiety   . Arthritis    OA hips  . Asthma    allergy and exercise induced asthma   . Depression   . Diabetes mellitus without complication ([place])   . Family history of breast cancer   . Family history of colon cancer   . Family history of stomach cancer   . GERD (gastroesophageal reflux disease)   . Hypertension   . Malignant neoplasm of lower-outer quadrant of right female breast ([place]) 06/21/2016  . Seasonal allergies    Past Surgical History:  Procedure Laterality Date  . BREAST LUMPECTOMY     right   . BREAST RECONSTRUCTION WITH PLACEMENT OF TISSUE EXPANDER AND FLEX HD (ACELLULAR HYDRATED DERMIS) Bilateral 08/13/2016   Procedure: BREAST RECONSTRUCTION WITH PLACEMENT OF TISSUE EXPANDER AND ALLODERM;  Surgeon: Irene Limbo, MD;  Location: [place];  Service: Plastics;  Laterality: Bilateral;  . COLONOSCOPY N/A 10/31/2017   Procedure:  COLONOSCOPY;  Surgeon: Danie Binder, MD;  [facility];  Service: Endoscopy;  Laterality: N/A;  8:30 Am  . DILATION AND CURETTAGE OF UTERUS    . JOINT REPLACEMENT    . LIPOSUCTION WITH LIPOFILLING Bilateral 11/05/2016   Procedure: LIPOFILLING FROM ABDOMEN TO BILATERAL CHEST;  Surgeon: Irene Limbo, MD;  Location: [place];  Service: Plastics;  Laterality: Bilateral;  . MASTECTOMY W/ SENTINEL NODE BIOPSY Bilateral 08/13/2016   Procedure: BILATERAL TOTAL MASTECTOMY WITH RIGHT AXILLARY SENTINEL LYMPH NODE BIOPSY;  Surgeon: [name], MD;  Location: [place];  Service: General;  Laterality: Bilateral;  . REMOVAL OF BILATERAL TISSUE EXPANDERS WITH PLACEMENT OF BILATERAL BREAST IMPLANTS Bilateral 11/05/2016   Procedure: REMOVAL OF BILATERAL TISSUE EXPANDERS WITH PLACEMENT OF BILATERAL BREAST SALINE IMPLANTS;  Surgeon: Irene Limbo, MD;  Location: [place];  Service: Plastics;  Laterality: Bilateral;  . TOTAL HIP ARTHROPLASTY Right 01/07/2016   Procedure: TOTAL HIP ARTHROPLASTY ANTERIOR APPROACH;  Surgeon: Gaynelle Arabian, MD;  [facility];  Service: Orthopedics;  Laterality: Right;  . TOTAL HIP ARTHROPLASTY Left 03/22/2018   Procedure: LEFT TOTAL HIP ARTHROPLASTY ANTERIOR APPROACH;  Surgeon: Gaynelle Arabian, MD;  [facility];  Service: Orthopedics;  Laterality: Left;   Current Outpatient Medications on File Prior to Visit  Medication Sig Dispense Refill  . benazepril (  LOTENSIN) 20 MG tablet TAKE (1) TABLET BY MOUTH TWICE DAILY. 180 tablet 3  . diclofenac sodium (VOLTAREN) 1 % GEL Apply 4 g topically 4 (four) times daily. (Patient taking differently: Apply 4 g topically 4 (four) times daily as needed (pain). ) 100 g 1  . escitalopram (LEXAPRO) 20 MG tablet TAKE ONE TABLET BY MOUTH ONCE DAILY. 90 tablet 2  . exemestane (AROMASIN) 25 MG tablet TAKE ONE TABLET BY MOUTH ONCE DAILY AFTER BREAKFAST. 90 tablet 3  . levocetirizine  (XYZAL) 5 MG tablet Take 5 mg by mouth at bedtime.    . metFORMIN (GLUCOPHAGE) 1000 MG tablet TAKE 1 TABLET BY MOUTH TWICE DAILY WITH MEALS. 180 tablet 0  . omeprazole (PRILOSEC) 20 MG capsule TAKE (1) CAPSULE BY MOUTH ONCE DAILY. 90 capsule 3   No current facility-administered medications on file prior to visit.    Allergies  Allergen Reactions  . Corn-Containing Products Itching    Running nose  . Tomato Itching    Running nose   . Azithromycin Itching and Rash  . Biaxin [Clarithromycin] Rash  . Codeine Rash    Thinks a codeine cough syrup caused rash.   Social History   Socioeconomic History  . Marital status: Married    Spouse name: Not on file  . Number of children: 1  . Years of education: Not on file  . Highest education level: Not on file  Occupational History  . Not on file  Social Needs  . Financial resource strain: Not on file  . Food insecurity    Worry: Not on file    Inability: Not on file  . Transportation needs    Medical: Not on file    Non-medical: Not on file  Tobacco Use  . Smoking status: Never Smoker  . Smokeless tobacco: Never Used  Substance and Sexual Activity  . Alcohol use: No  . Drug use: No  . Sexual activity: Yes  Lifestyle  . Physical activity    Days per week: Not on file    Minutes per session: Not on file  . Stress: Not on file  Relationships  . Social Herbalist on phone: Not on file    Gets together: Not on file    Attends religious service: Not on file    Active member of club or organization: Not on file    Attends meetings of clubs or organizations: Not on file    Relationship status: Not on file  . Intimate partner violence    Fear of current or ex partner: Not on file    Emotionally abused: Not on file    Physically abused: Not on file    Forced sexual activity: Not on file  Other Topics Concern  . Not on file  Social History Narrative  . Not on file      Review of Systems  All other systems  reviewed and are negative.      Objective:   Physical Exam  Physical exam could not be performed today as the patient was seen as a telephone visit     Assessment & Plan:  1. Exposure to Covid-19 Virus Patient may have had exposure to COVID virus.  Her husband has not yet tested positive.  I have recommended that we await the results of his COVID testing tomorrow morning.  If his cover test is positive I would definitely recommend testing her.  In the meantime I have recommended that she quarantine herself for  the next 24 hours until we have the results of the test back.  She has no exposure to anyone else.  She has had no symptoms.  She has not even seen her husband very much in the last 7 weeks as he has been a trucker on the road and she has been staying with her mother caring for her mother.  Therefore I believe that her likelihood of even catching what her husband has is low and has not yet been confirmed that he has COVID.  His symptoms are extremely mild and may just represent an upper respiratory infection.  Patient is comfortable with this plan.  We will await the results of his test tomorrow morning.

## 2019-04-12 NOTE — Progress Notes (Signed)
[place]   Telephone:([phone] Fax:([phone]) [phone]   Clinic Follow up Note   Patient Care Team: [name], MD as PCP - General (Family Medicine) [name], MD as Consulting Physician (General Surgery) [name], MD as Consulting Physician (Hematology) [name], MD as Attending Physician (Radiation Oncology) [name], NP as Nurse Practitioner (Hematology and Oncology)  Date of Service:  04/16/2019  CHIEF COMPLAINT: Follow up for right multifocal breast cancer  SUMMARY OF ONCOLOGIC HISTORY: Oncology History Overview Note  Malignant neoplasm of lower-outer quadrant of right female breast [facility])   Staging form: Breast, AJCC 7th Edition   - Clinical stage from 06/23/2016: Stage IA (T1c, N0, M0) - Unsigned    Malignant neoplasm of lower-outer quadrant of right female breast ([place])  04/28/2015 Imaging   Bone Density Report 04/28/2015 [facility] Spine (L1-L4): T-Score = -1.7. Osteopenia Femoral Neck (Left) : T-Score = -1.0 Normal Femoral Neck (Right): T-Score = 0.4 Normal   03/01/2016 Mammogram   Bilateral screening mammogram was negative   06/08/2016 Imaging   Bilateral breast MRI with and without contrast showed 4 lesions in the right breast, 2 adjacent 1.4 cm mass in the inferior aspect of the right breast, and in the right lateral aspect of the breast, there are 8 mm and a 7 mm irregular enhancing mass. Multiple small enhancing lesions within the liver, indeterminate. No axillary adenopathy.   06/16/2016 Initial Diagnosis   Malignant neoplasm of lower-outer quadrant of right female breast ([place])   06/16/2016 Initial Biopsy   Right breast needle core biopsy at 9 clock, 6:30 clock, 6 clock showed invasive lobular carcinoma, and LCIS   06/16/2016 Receptors her2   two right breast biopsy showed ER strongly positive, PR strongly positive, HER-2 negative, Ki-67 5-10%    06/16/2016 Imaging   Right breast and axilla ultrasound showed a  1.4 cm mass at the 6:30 o'clock, 1.0 cm mass at the 6:00, and 90m mass at 9:00 position. Axilla was negative.   06/28/2016 Imaging   MRI ABDOMEN W W/O CONTRAST 06/28/2016 IMPRESSION: 1. Multiple hepatic hemangiomas.  No suspicious liver lesion. 2. Hepatic steatosis. 3. No evidence of abdominal metastasis.   06/28/2016 Genetic Testing   Patient has genetic testing done for breast cancer. Results revealed patient has the following variant called, MSH2 [id number].   08/13/2016 Surgery   Bilateral simple mastectomy and right SLN biopsy     08/13/2016 Pathology Results   Right mastectomy showed invasive ductal carcinoma, G2, multifocal, 1.5cm, 1.5cm, and 1.0cm. (+) LCIS, margins are negative, 1 sentinel lymph node was negative. Left simple mastectomy showed LCIS, ADH, no malignancy.    08/13/2016 Oncotype testing   Oncotype recurrence score 11, which predicts 10 year distant recurrence risk of 7% with tamoxifen alone, low risk category. No adjuvant chemo recommended based on this.    09/25/2016 -  Anti-estrogen oral therapy   Adjuvant exemestane 243mdaily    11/05/2016 Surgery   REMOVAL OF BILATERAL TISSUE EXPANDERS WITH PLACEMENT OF BILATERAL BREAST SALINE IMPLANTS by Dr. ThIran Planas     CURRENT THERAPY:  Exemestane (Aromasin) 25 mg daily started on September 25, 2016, Plan for 5-7 years.   INTERVAL HISTORY:  Carolyn DIGGSs here for a follow up right breast cancer. Carolyn Glass presents to the clinic alone. Carolyn Glass notes Carolyn Glass is doing well. Carolyn Glass is taking Exemestane. Carolyn Glass has mild occasional hot flashes. Carolyn Glass notes right leg/hi pain will effects her sleep at times. Carolyn Glass notes much improved left  wrist pain. Carolyn Glass notes her concerns with late cancer recurrence.     REVIEW OF SYSTEMS:   Constitutional: Denies fevers, chills or abnormal weight loss (+) trouble sleeping  Eyes: Denies blurriness of vision Ears, nose, mouth, throat, and face: Denies mucositis or sore throat Respiratory: Denies cough,  dyspnea or wheezes Cardiovascular: Denies palpitation, chest discomfort or lower extremity swelling Gastrointestinal:  Denies nausea, heartburn or change in bowel habits Skin: Denies abnormal skin rashes MKS: (+) Left wrist and hands pain (+) right leg/hip pain  Lymphatics: Denies new lymphadenopathy or easy bruising Neurological:Denies numbness, tingling or new weaknesses Behavioral/Psych: Mood is stable, no new changes  All other systems were reviewed with the patient and are negative.  MEDICAL HISTORY:  Past Medical History:  Diagnosis Date  . Anxiety   . Arthritis    OA hips  . Asthma    allergy and exercise induced asthma   . Depression   . Diabetes mellitus without complication ([place])   . Family history of breast cancer   . Family history of colon cancer   . Family history of stomach cancer   . GERD (gastroesophageal reflux disease)   . Hypertension   . Malignant neoplasm of lower-outer quadrant of right female breast ([place]) 06/21/2016  . Seasonal allergies     SURGICAL HISTORY: Past Surgical History:  Procedure Laterality Date  . BREAST LUMPECTOMY     right   . BREAST RECONSTRUCTION WITH PLACEMENT OF TISSUE EXPANDER AND FLEX HD (ACELLULAR HYDRATED DERMIS) Bilateral 08/13/2016   Procedure: BREAST RECONSTRUCTION WITH PLACEMENT OF TISSUE EXPANDER AND ALLODERM;  Surgeon: Irene Limbo, MD;  Location: [place];  Service: Plastics;  Laterality: Bilateral;  . COLONOSCOPY N/A 10/31/2017   Procedure: COLONOSCOPY;  Surgeon: Danie Binder, MD;  [facility];  Service: Endoscopy;  Laterality: N/A;  8:30 Am  . DILATION AND CURETTAGE OF UTERUS    . JOINT REPLACEMENT    . LIPOSUCTION WITH LIPOFILLING Bilateral 11/05/2016   Procedure: LIPOFILLING FROM ABDOMEN TO BILATERAL CHEST;  Surgeon: Irene Limbo, MD;  Location: [place];  Service: Plastics;  Laterality: Bilateral;  . MASTECTOMY W/ SENTINEL NODE BIOPSY Bilateral 08/13/2016    Procedure: BILATERAL TOTAL MASTECTOMY WITH RIGHT AXILLARY SENTINEL LYMPH NODE BIOPSY;  Surgeon: [name], MD;  Location: [place];  Service: General;  Laterality: Bilateral;  . REMOVAL OF BILATERAL TISSUE EXPANDERS WITH PLACEMENT OF BILATERAL BREAST IMPLANTS Bilateral 11/05/2016   Procedure: REMOVAL OF BILATERAL TISSUE EXPANDERS WITH PLACEMENT OF BILATERAL BREAST SALINE IMPLANTS;  Surgeon: Irene Limbo, MD;  Location: [place];  Service: Plastics;  Laterality: Bilateral;  . TOTAL HIP ARTHROPLASTY Right 01/07/2016   Procedure: TOTAL HIP ARTHROPLASTY ANTERIOR APPROACH;  Surgeon: Gaynelle Arabian, MD;  [facility];  Service: Orthopedics;  Laterality: Right;  . TOTAL HIP ARTHROPLASTY Left 03/22/2018   Procedure: LEFT TOTAL HIP ARTHROPLASTY ANTERIOR APPROACH;  Surgeon: Gaynelle Arabian, MD;  [facility];  Service: Orthopedics;  Laterality: Left;    I have reviewed the social history and family history with the patient and they are unchanged from previous note.  ALLERGIES:  is allergic to corn-containing products; tomato; azithromycin; biaxin [clarithromycin]; and codeine.  MEDICATIONS:  Current Outpatient Medications  Medication Sig Dispense Refill  . benazepril (LOTENSIN) 20 MG tablet TAKE (1) TABLET BY MOUTH TWICE DAILY. 180 tablet 3  . escitalopram (LEXAPRO) 20 MG tablet TAKE ONE TABLET BY MOUTH ONCE DAILY. 90 tablet 2  . exemestane (AROMASIN) 25  MG tablet TAKE ONE TABLET BY MOUTH ONCE DAILY AFTER BREAKFAST. 90 tablet 3  . fexofenadine (ALLEGRA) 60 MG tablet Take 60 mg by mouth 2 (two) times daily.    . metFORMIN (GLUCOPHAGE) 1000 MG tablet TAKE 1 TABLET BY MOUTH TWICE DAILY WITH MEALS. 180 tablet 0  . omeprazole (PRILOSEC) 20 MG capsule TAKE (1) CAPSULE BY MOUTH ONCE DAILY. 90 capsule 3   No current facility-administered medications for this visit.     PHYSICAL EXAMINATION: ECOG PERFORMANCE STATUS: 0 - Asymptomatic  Vitals:   04/16/19 1343   BP: 118/62  Pulse: 82  Resp: 18  Temp: 98.7 F (37.1 C)  SpO2: 98%   Filed Weights   04/16/19 1343  Weight: 171 lb 1.6 oz (77.6 kg)    GENERAL:alert, no distress and comfortable SKIN: skin color, texture, turgor are normal, no rashes or significant lesions EYES: normal, Conjunctiva are pink and non-injected, sclera clear  NECK: supple, thyroid normal size, non-tender, without nodularity LYMPH:  no palpable lymphadenopathy in the cervical, axillary  LUNGS: clear to auscultation and percussion with normal breathing effort HEART: regular rate & rhythm and no murmurs and no lower extremity edema ABDOMEN:abdomen soft, non-tender and normal bowel sounds Musculoskeletal:no cyanosis of digits and no clubbing  NEURO: alert & oriented x 3 with fluent speech, no focal motor/sensory deficits BREAST: S/p mastectomy and reconstruction with implants in place: Surgical incisions healed well. No palpable mass, nodules or adenopathy bilaterally. Breast exam benign.   LABORATORY DATA:  I have reviewed the data as listed CBC Latest Ref Rng & Units 04/16/2019 06/14/2018 03/23/2018  WBC 4.0 - 10.5 K/uL 5.4 5.4 10.5  Hemoglobin 12.0 - 15.0 g/dL 13.2 12.5 11.7(L)  Hematocrit 36.0 - 46.0 % 40.6 38.5 35.6(L)  Platelets 150 - 400 K/uL 304 305 311     CMP Latest Ref Rng & Units 04/16/2019 06/14/2018 03/23/2018  Glucose 70 - 99 mg/dL 148(H) 84 178(H)  BUN 8 - 23 mg/dL 11 12 10   Creatinine 0.44 - 1.00 mg/dL 0.94 0.74 0.61  Sodium 135 - 145 mmol/L 140 141 137  Potassium 3.5 - 5.1 mmol/L 4.3 4.3 4.2  Chloride 98 - 111 mmol/L 104 104 102  CO2 22 - 32 mmol/L 23 27 26   Calcium 8.9 - 10.3 mg/dL 9.4 9.7 9.1  Total Protein 6.5 - 8.1 g/dL 7.2 7.3 -  Total Bilirubin 0.3 - 1.2 mg/dL 0.3 0.4 -  Alkaline Phos 38 - 126 U/L 114 121 -  AST 15 - 41 U/L 20 19 -  ALT 0 - 44 U/L 30 20 -      RADIOGRAPHIC STUDIES: I have personally reviewed the radiological images as listed and agreed with the findings in the report.  No results found.   ASSESSMENT & PLAN:  Carolyn Glass is a 63 y.o. female with   1. Malignant neoplasm of lower-outer quadrant of right female breast, invasive lobular carcinoma and LCIS, mpT1cN0M0, stage IA, G2, ER+/PR+/HER2-, oncotype low risk  -Carolyn Glass was diagnosed in 06/2016. Carolyn Glass is s/p b/l mastectomy and breast reconstruction surgery.  -Oncotype DX score was 11 with the 10 year risk of distant recurrence with tamoxifen alone is 7%. This is at low risk. Therefore I do not recommend chemotherapy.  Given Carolyn Glass was node negative post-mastectomy radiation was not recommended.  -Carolyn Glass has been on anti-estrogen therapy with exemestane since 09/2016. Plan for total 5-7years. Tolerating well with manageable hot flush.  -Carolyn Glass is clinically doing well. Lab reviewed, her CBC and CMP are  within normal limits except BG 148. Vitamin D still pending. Her physical exam was unremarkable. There is no clinical concern for recurrence. -I reviewed Carolyn Glass has lower risk of cancer recurrence after surgery and on AI. Given b/l mastectomy Carolyn Glass does not need mammogram. Will continue to monitor her with labs and exams.  -Continue surveillance and Exemestane  -F/u in 6 months   2. Genetics -Genetic testing did identify a VUS in MSH2, c.2650A>T. -I also reviewed keeping up with age appropriate cancer screenings. Her last colonoscopy showed beginning stages of diverticulitis.   3. DM, type 2 -On metformin and Lotensin -I advised her to f/u with her PCP  4. Arthritis and arthralgia -Mild joint pain in b/l fingers and left wrists, manageable  -Carolyn Glass also has recent right leg/hip pain which can effects her sleep at night.   5. Osteopenia  -Bone scan 04/28/15 revealed [facility] Spine (L1-L4) T-Score = -1.7. -We discussed that exemestane may weak bone -Since 02/16/17 elevated calcium level Carolyn Glass is fine to not to take anymore supplemental calcium. Will continue Vitamin D.  -Carolyn Glass is overdue for bone scan, Carolyn Glass will set this up with her  GYN at her next visit.    PLAN -Continue Exemestane, refill today  -Lab and f/u in 6 months -Carolyn Glass will call her GYN to schedule a DEXA scanat next visit    No problem-specific Assessment & Plan notes found for this encounter.   No orders of the defined types were placed in this encounter.  All questions were answered. The patient knows to call the clinic with any problems, questions or concerns. No barriers to learning was detected. I spent 15 minutes counseling the patient face to face. The total time spent in the appointment was 20 minutes and more than 50% was on counseling and review of test results     [name], MD 04/16/2019   I, Joslyn Devon, am acting as scribe for [name], MD.   I have reviewed the above documentation for accuracy and completeness, and I agree with the above.

## 2019-04-16 ENCOUNTER — Inpatient Hospital Stay: Payer: BC Managed Care – PPO | Admitting: Hematology

## 2019-04-16 ENCOUNTER — Other Ambulatory Visit: Payer: Self-pay

## 2019-04-16 ENCOUNTER — Encounter: Payer: Self-pay | Admitting: Hematology

## 2019-04-16 ENCOUNTER — Inpatient Hospital Stay: Payer: BC Managed Care – PPO | Attending: Hematology

## 2019-04-16 VITALS — BP 118/62 | HR 82 | Temp 98.7°F | Resp 18 | Ht 63.0 in | Wt 171.1 lb

## 2019-04-16 DIAGNOSIS — C50511 Malignant neoplasm of lower-outer quadrant of right female breast: Secondary | ICD-10-CM

## 2019-04-16 DIAGNOSIS — Z79811 Long term (current) use of aromatase inhibitors: Secondary | ICD-10-CM | POA: Diagnosis not present

## 2019-04-16 DIAGNOSIS — M8580 Other specified disorders of bone density and structure, unspecified site: Secondary | ICD-10-CM | POA: Insufficient documentation

## 2019-04-16 DIAGNOSIS — E119 Type 2 diabetes mellitus without complications: Secondary | ICD-10-CM | POA: Insufficient documentation

## 2019-04-16 DIAGNOSIS — Z170 Estrogen receptor positive status [ER+]: Secondary | ICD-10-CM

## 2019-04-16 DIAGNOSIS — M79604 Pain in right leg: Secondary | ICD-10-CM | POA: Insufficient documentation

## 2019-04-16 DIAGNOSIS — M8588 Other specified disorders of bone density and structure, other site: Secondary | ICD-10-CM | POA: Diagnosis not present

## 2019-04-16 DIAGNOSIS — Z7984 Long term (current) use of oral hypoglycemic drugs: Secondary | ICD-10-CM | POA: Diagnosis not present

## 2019-04-16 DIAGNOSIS — I10 Essential (primary) hypertension: Secondary | ICD-10-CM | POA: Diagnosis not present

## 2019-04-16 DIAGNOSIS — R232 Flushing: Secondary | ICD-10-CM | POA: Diagnosis not present

## 2019-04-16 DIAGNOSIS — Z79899 Other long term (current) drug therapy: Secondary | ICD-10-CM | POA: Insufficient documentation

## 2019-04-16 LAB — COMPREHENSIVE METABOLIC PANEL
ALT: 30 U/L (ref 0–44)
AST: 20 U/L (ref 15–41)
Albumin: 4.2 g/dL (ref 3.5–5.0)
Alkaline Phosphatase: 114 U/L (ref 38–126)
Anion gap: 13 (ref 5–15)
BUN: 11 mg/dL (ref 8–23)
CO2: 23 mmol/L (ref 22–32)
Calcium: 9.4 mg/dL (ref 8.9–10.3)
Chloride: 104 mmol/L (ref 98–111)
Creatinine, Ser: 0.94 mg/dL (ref 0.44–1.00)
GFR calc Af Amer: >60 mL/min (ref >60)
GFR calc non Af Amer: >60 mL/min (ref >60)
Glucose, Bld: 148 mg/dL — ABNORMAL HIGH (ref 70–99)
Potassium: 4.3 mmol/L (ref 3.5–5.1)
Sodium: 140 mmol/L (ref 135–145)
Total Bilirubin: 0.3 mg/dL (ref 0.3–1.2)
Total Protein: 7.2 g/dL (ref 6.5–8.1)

## 2019-04-16 LAB — CBC WITH DIFFERENTIAL/PLATELET
Abs Immature Granulocytes: 0.01 10*3/uL (ref 0.00–0.07)
Basophils Absolute: 0 10*3/uL (ref 0.0–0.1)
Basophils Relative: 1 %
Eosinophils Absolute: 0.2 10*3/uL (ref 0.0–0.5)
Eosinophils Relative: 3 %
HCT: 40.6 % (ref 36.0–46.0)
Hemoglobin: 13.2 g/dL (ref 12.0–15.0)
Immature Granulocytes: 0 %
Lymphocytes Relative: 32 %
Lymphs Abs: 1.7 10*3/uL (ref 0.7–4.0)
MCH: 30.6 pg (ref 26.0–34.0)
MCHC: 32.5 g/dL (ref 30.0–36.0)
MCV: 94.2 fL (ref 80.0–100.0)
Monocytes Absolute: 0.5 10*3/uL (ref 0.1–1.0)
Monocytes Relative: 10 %
Neutro Abs: 2.9 10*3/uL (ref 1.7–7.7)
Neutrophils Relative %: 54 %
Platelets: 304 10*3/uL (ref 150–400)
RBC: 4.31 MIL/uL (ref 3.87–5.11)
RDW: 13.1 % (ref 11.5–15.5)
WBC: 5.4 10*3/uL (ref 4.0–10.5)
nRBC: 0 % (ref 0.0–0.2)

## 2019-04-17 ENCOUNTER — Telehealth: Payer: Self-pay | Admitting: Hematology

## 2019-04-17 ENCOUNTER — Telehealth: Payer: Self-pay | Admitting: *Deleted

## 2019-04-17 LAB — VITAMIN D 25 HYDROXY (VIT D DEFICIENCY, FRACTURES): Vit D, 25-Hydroxy: 24.3 ng/mL — ABNORMAL LOW (ref 30.0–100.0)

## 2019-04-17 NOTE — Telephone Encounter (Signed)
Called pt's cell # & informed her that Vit D level low & gave Dr Ernestina Penna recommendations of 1000-2000 IU Vit D daily.  She said Dr Burr Medico had told her this before but she picked up Vit C instead.  She says she will get Vit D.

## 2019-04-17 NOTE — Telephone Encounter (Signed)
Scheduled appt per 8/10 los.  Will print and mail out 6 month calendar the week of august 24 when I am in the office.

## 2019-04-17 NOTE — Telephone Encounter (Signed)
-----   Message from [name], MD sent at 04/17/2019 10:05 AM EDT ----- Please let pt know her vitd level is low, and I recommend her taking OTC 1000-2000u VitD daily, if she is already on it,then double her dosage, thanks   [name]  04/17/2019

## 2019-04-26 ENCOUNTER — Other Ambulatory Visit: Payer: Self-pay

## 2019-04-27 ENCOUNTER — Encounter: Payer: Self-pay | Admitting: Family Medicine

## 2019-04-27 ENCOUNTER — Ambulatory Visit: Payer: BC Managed Care – PPO | Admitting: Family Medicine

## 2019-04-27 VITALS — BP 128/74 | HR 74 | Temp 98.5°F | Resp 16 | Ht 63.0 in | Wt 168.0 lb

## 2019-04-27 DIAGNOSIS — R252 Cramp and spasm: Secondary | ICD-10-CM

## 2019-04-27 DIAGNOSIS — L821 Other seborrheic keratosis: Secondary | ICD-10-CM

## 2019-04-27 DIAGNOSIS — E119 Type 2 diabetes mellitus without complications: Secondary | ICD-10-CM

## 2019-04-27 DIAGNOSIS — Z23 Encounter for immunization: Secondary | ICD-10-CM | POA: Diagnosis not present

## 2019-04-27 MED ORDER — CYCLOBENZAPRINE HCL 10 MG PO TABS: 10.0000 mg | ORAL_TABLET | Freq: Three times a day (TID) | ORAL | 0 refills | Status: DC | PRN

## 2019-04-27 NOTE — Progress Notes (Signed)
Subjective:    Patient ID: Carolyn Glass, female    DOB: [birth year], 63 y.o.   MRN: [id number]  Medication Refill    Patient presents today with several concerns.  #1 she would like a flu shot.  #2 she would like lab work to monitor her diabetes.  Patient is currently on metformin 1000 mg twice daily.  She denies any polyuria, polydipsia, or blurry vision.  She denies any diarrhea.  She denies any abdominal discomfort.  She denies any neuropathy in her feet.  #3 she reports cramps in her left lower back, left gluteus and also both hamstrings.  These occur primarily at night when she is lying in bed.  She will also sometimes get a cramp in her left lower back while standing.  She denies any claudication.  She denies any lumbar radiculopathy.  She has a history of bilateral hip replacements but she denies any pain with range of motion in either hip.  She denies any numbness or tingling in her legs.  Cramps occur only at night while trying to rest.  They do not occur all the time.  She is not stretching prior to bedtime.  In fact her flexibility is quite poor.  #4 she is concerned about lesions on the left forehead and also 2 lesions on her back.  The lesion on her left forehead is 4 mm in diameter.  It is a warty brown papule that appears to be an SK.  The 2 lesions on her back are both approximately 1 cm in diameter.  They are definitely seborrheic keratoses.  They are brown warty plaque-like papules.  There are no moles. Past Medical History:  Diagnosis Date  . Anxiety   . Arthritis    OA hips  . Asthma    allergy and exercise induced asthma   . Depression   . Diabetes mellitus without complication ([place])   . Family history of breast cancer   . Family history of colon cancer   . Family history of stomach cancer   . GERD (gastroesophageal reflux disease)   . Hypertension   . Malignant neoplasm of lower-outer quadrant of right female breast ([place]) 06/21/2016  . Seasonal allergies    Past  Surgical History:  Procedure Laterality Date  . BREAST LUMPECTOMY     right   . BREAST RECONSTRUCTION WITH PLACEMENT OF TISSUE EXPANDER AND FLEX HD (ACELLULAR HYDRATED DERMIS) Bilateral 08/13/2016   Procedure: BREAST RECONSTRUCTION WITH PLACEMENT OF TISSUE EXPANDER AND ALLODERM;  Surgeon: Irene Limbo, MD;  Location: [place];  Service: Plastics;  Laterality: Bilateral;  . COLONOSCOPY N/A 10/31/2017   Procedure: COLONOSCOPY;  Surgeon: Danie Binder, MD;  [facility];  Service: Endoscopy;  Laterality: N/A;  8:30 Am  . DILATION AND CURETTAGE OF UTERUS    . JOINT REPLACEMENT    . LIPOSUCTION WITH LIPOFILLING Bilateral 11/05/2016   Procedure: LIPOFILLING FROM ABDOMEN TO BILATERAL CHEST;  Surgeon: Irene Limbo, MD;  Location: [place];  Service: Plastics;  Laterality: Bilateral;  . MASTECTOMY W/ SENTINEL NODE BIOPSY Bilateral 08/13/2016   Procedure: BILATERAL TOTAL MASTECTOMY WITH RIGHT AXILLARY SENTINEL LYMPH NODE BIOPSY;  Surgeon: [name], MD;  Location: [place];  Service: General;  Laterality: Bilateral;  . REMOVAL OF BILATERAL TISSUE EXPANDERS WITH PLACEMENT OF BILATERAL BREAST IMPLANTS Bilateral 11/05/2016   Procedure: REMOVAL OF BILATERAL TISSUE EXPANDERS WITH PLACEMENT OF BILATERAL BREAST SALINE IMPLANTS;  Surgeon: Irene Limbo, MD;  Location: [facility];  Service: Plastics;  Laterality: Bilateral;  . TOTAL HIP ARTHROPLASTY Right 01/07/2016   Procedure: TOTAL HIP ARTHROPLASTY ANTERIOR APPROACH;  Surgeon: Gaynelle Arabian, MD;  [facility];  Service: Orthopedics;  Laterality: Right;  . TOTAL HIP ARTHROPLASTY Left 03/22/2018   Procedure: LEFT TOTAL HIP ARTHROPLASTY ANTERIOR APPROACH;  Surgeon: Gaynelle Arabian, MD;  [facility];  Service: Orthopedics;  Laterality: Left;   Current Outpatient Medications on File Prior to Visit  Medication Sig Dispense Refill  . benazepril (LOTENSIN) 20 MG tablet  TAKE (1) TABLET BY MOUTH TWICE DAILY. 180 tablet 3  . escitalopram (LEXAPRO) 20 MG tablet TAKE ONE TABLET BY MOUTH ONCE DAILY. 90 tablet 2  . exemestane (AROMASIN) 25 MG tablet TAKE ONE TABLET BY MOUTH ONCE DAILY AFTER BREAKFAST. 90 tablet 3  . fexofenadine (ALLEGRA) 60 MG tablet Take 60 mg by mouth 2 (two) times daily.    . metFORMIN (GLUCOPHAGE) 1000 MG tablet TAKE 1 TABLET BY MOUTH TWICE DAILY WITH MEALS. 180 tablet 0  . omeprazole (PRILOSEC) 20 MG capsule TAKE (1) CAPSULE BY MOUTH ONCE DAILY. 90 capsule 3   No current facility-administered medications on file prior to visit.    Allergies  Allergen Reactions  . Corn-Containing Products Itching    Running nose  . Tomato Itching    Running nose   . Azithromycin Itching and Rash  . Biaxin [Clarithromycin] Rash  . Codeine Rash    Thinks a codeine cough syrup caused rash.   Social History   Socioeconomic History  . Marital status: Married    Spouse name: Not on file  . Number of children: 1  . Years of education: Not on file  . Highest education level: Not on file  Occupational History  . Not on file  Social Needs  . Financial resource strain: Not on file  . Food insecurity    Worry: Not on file    Inability: Not on file  . Transportation needs    Medical: Not on file    Non-medical: Not on file  Tobacco Use  . Smoking status: Never Smoker  . Smokeless tobacco: Never Used  Substance and Sexual Activity  . Alcohol use: No  . Drug use: No  . Sexual activity: Yes  Lifestyle  . Physical activity    Days per week: Not on file    Minutes per session: Not on file  . Stress: Not on file  Relationships  . Social Herbalist on phone: Not on file    Gets together: Not on file    Attends religious service: Not on file    Active member of club or organization: Not on file    Attends meetings of clubs or organizations: Not on file    Relationship status: Not on file  . Intimate partner violence    Fear of current  or ex partner: Not on file    Emotionally abused: Not on file    Physically abused: Not on file    Forced sexual activity: Not on file  Other Topics Concern  . Not on file  Social History Narrative  . Not on file   Family History  Problem Relation Age of Onset  . Arthritis Mother   . Asthma Mother   . Diabetes Mother   . Hyperlipidemia Mother   . Hypertension Mother   . Arthritis Father   . Asthma Father   . Stroke Father   . Hyperlipidemia Father   .  Early death Brother 8       Muscular Dystrophy  . Birth defects Brother   . Cancer Maternal Uncle        possibly stomach  . Mental retardation Maternal Uncle   . Cancer Paternal Grandmother 83       breast cancer   . Cancer Cousin 61       paternal cousin's daughter with breast cancer  . Colon cancer Maternal Aunt        dx in her 51s  . Leukemia Paternal Uncle   . Lymphoma Paternal Uncle   . Down syndrome Paternal Uncle       Review of Systems  All other systems reviewed and are negative.      Objective:   Physical Exam  Constitutional: She appears well-developed and well-nourished. No distress.  Neck: Normal range of motion. Neck supple.  Cardiovascular: Normal rate, regular rhythm, normal heart sounds and intact distal pulses. Exam reveals no gallop and no friction rub.  No murmur heard. Pulmonary/Chest: Effort normal and breath sounds normal. No respiratory distress. She has no wheezes. She has no rales. She exhibits no tenderness.  Musculoskeletal: Normal range of motion.        General: No tenderness, deformity or edema.  Skin: Skin is warm. No rash noted. She is not diaphoretic. No erythema. No pallor.     Vitals reviewed.         Assessment & Plan:  1. Controlled type 2 diabetes mellitus without complication, without long-term current use of insulin ([place]) Patient is not fasting so I cannot check fasting lipid panel.  I will check a hemoglobin A1c.  Goal hemoglobin A1c is less than 6.5. - CBC  with Differential/Platelet - COMPLETE METABOLIC PANEL WITH GFR - Hemoglobin A1c  2. Need for immunization against influenza Patient received her flu shot today. - Flu Vaccine QUAD 36+ mos IM  3. Cramps of lower extremity Check electrolytes.  Use Flexeril 10 mg prior to bedtime.  Also recommended several stretches to help prevent cramps such as crossing her legs at the knees and bending over and trying to touch her toes.  I recommended that she alternate legs.  Also recommended that she spread her legs in a V-pattern and lean over at the waist and try to touch the ground.  I also recommended that she try to stretch her back child pose stretch  4. Seborrheic keratoses Reassured the patient that the 3 lesions she showed me are benign and do not require biopsy.

## 2019-04-28 LAB — CBC WITH DIFFERENTIAL/PLATELET
Absolute Monocytes: 598 cells/uL (ref 200–950)
Basophils Absolute: 31 cells/uL (ref 0–200)
Basophils Relative: 0.6 %
Eosinophils Absolute: 218 cells/uL (ref 15–500)
Eosinophils Relative: 4.2 %
HCT: 39.8 % (ref 35.0–45.0)
Hemoglobin: 13.2 g/dL (ref 11.7–15.5)
Lymphs Abs: 1648 cells/uL (ref 850–3900)
MCH: 30.6 pg (ref 27.0–33.0)
MCHC: 33.2 g/dL (ref 32.0–36.0)
MCV: 92.1 fL (ref 80.0–100.0)
MPV: 10.8 fL (ref 7.5–12.5)
Monocytes Relative: 11.5 %
Neutro Abs: 2704 cells/uL (ref 1500–7800)
Neutrophils Relative %: 52 %
Platelets: 313 10*3/uL (ref 140–400)
RBC: 4.32 10*6/uL (ref 3.80–5.10)
RDW: 13.1 % (ref 11.0–15.0)
Total Lymphocyte: 31.7 %
WBC: 5.2 10*3/uL (ref 3.8–10.8)

## 2019-04-28 LAB — COMPLETE METABOLIC PANEL WITH GFR
AG Ratio: 1.7 (calc) (ref 1.0–2.5)
ALT: 24 U/L (ref 6–29)
AST: 16 U/L (ref 10–35)
Albumin: 4.2 g/dL (ref 3.6–5.1)
Alkaline phosphatase (APISO): 104 U/L (ref 37–153)
BUN: 11 mg/dL (ref 7–25)
CO2: 26 mmol/L (ref 20–32)
Calcium: 9.5 mg/dL (ref 8.6–10.4)
Chloride: 103 mmol/L (ref 98–110)
Creat: 0.67 mg/dL (ref 0.50–0.99)
GFR, Est African American: 108 mL/min/{1.73_m2} (ref >=60)
GFR, Est Non African American: 93 mL/min/{1.73_m2} (ref >=60)
Globulin: 2.5 g/dL (calc) (ref 1.9–3.7)
Glucose, Bld: 117 mg/dL — ABNORMAL HIGH (ref 65–99)
Potassium: 4.8 mmol/L (ref 3.5–5.3)
Sodium: 138 mmol/L (ref 135–146)
Total Bilirubin: 0.2 mg/dL (ref 0.2–1.2)
Total Protein: 6.7 g/dL (ref 6.1–8.1)

## 2019-04-28 LAB — HEMOGLOBIN A1C
Hgb A1c MFr Bld: 7.7 % of total Hgb — ABNORMAL HIGH (ref <5.7)
Mean Plasma Glucose: 174 (calc)
eAG (mmol/L): 9.7 (calc)

## 2019-05-11 ENCOUNTER — Telehealth: Payer: Self-pay | Admitting: Family Medicine

## 2019-05-11 NOTE — Telephone Encounter (Signed)
Patient said she had went to pharmacy and her diabetic medication was not there  Please call patient back at [phone]

## 2019-05-15 ENCOUNTER — Other Ambulatory Visit: Payer: Self-pay | Admitting: Hematology

## 2019-05-15 DIAGNOSIS — C50511 Malignant neoplasm of lower-outer quadrant of right female breast: Secondary | ICD-10-CM

## 2019-05-16 ENCOUNTER — Other Ambulatory Visit: Payer: Self-pay | Admitting: Family Medicine

## 2019-05-16 MED ORDER — SITAGLIPTIN PHOSPHATE 100 MG PO TABS: 100.0000 mg | ORAL_TABLET | Freq: Every day | ORAL | 3 refills | Status: DC

## 2019-05-16 NOTE — Telephone Encounter (Signed)
Pt was calling about Januvia - I sent a rx over to [facility] and pt aware

## 2019-05-18 ENCOUNTER — Other Ambulatory Visit: Payer: Self-pay | Admitting: Family Medicine

## 2019-05-31 ENCOUNTER — Telehealth: Payer: Self-pay | Admitting: Emergency Medicine

## 2019-05-31 NOTE — Telephone Encounter (Signed)
Per Dr. Burr Medico, pt's osteopenia slightly worse from most recent bone density scan.  Communicated result to pt as well as recommendation from MD Burr Medico to continue taking oral vitamin D supplement.  Pt verbalized understanding of this and to f/u as needed before her next visit on 10/17/2019.

## 2019-06-21 ENCOUNTER — Other Ambulatory Visit: Payer: Self-pay | Admitting: Family Medicine

## 2019-06-21 NOTE — Telephone Encounter (Signed)
Ok to refill 

## 2019-07-14 ENCOUNTER — Other Ambulatory Visit: Payer: Self-pay | Admitting: Hematology

## 2019-07-14 ENCOUNTER — Other Ambulatory Visit: Payer: Self-pay | Admitting: Family Medicine

## 2019-07-14 DIAGNOSIS — C50511 Malignant neoplasm of lower-outer quadrant of right female breast: Secondary | ICD-10-CM

## 2019-08-11 ENCOUNTER — Other Ambulatory Visit: Payer: Self-pay | Admitting: Hematology

## 2019-08-11 DIAGNOSIS — Z170 Estrogen receptor positive status [ER+]: Secondary | ICD-10-CM

## 2019-08-11 DIAGNOSIS — C50511 Malignant neoplasm of lower-outer quadrant of right female breast: Secondary | ICD-10-CM

## 2019-09-10 ENCOUNTER — Other Ambulatory Visit: Payer: Self-pay | Admitting: Hematology

## 2019-09-10 ENCOUNTER — Other Ambulatory Visit: Payer: Self-pay | Admitting: Family Medicine

## 2019-09-10 DIAGNOSIS — Z170 Estrogen receptor positive status [ER+]: Secondary | ICD-10-CM

## 2019-09-10 DIAGNOSIS — C50511 Malignant neoplasm of lower-outer quadrant of right female breast: Secondary | ICD-10-CM

## 2019-09-18 ENCOUNTER — Ambulatory Visit: Payer: BC Managed Care – PPO | Attending: Internal Medicine

## 2019-09-18 ENCOUNTER — Other Ambulatory Visit: Payer: Self-pay

## 2019-09-18 DIAGNOSIS — Z20822 Contact with and (suspected) exposure to covid-19: Secondary | ICD-10-CM

## 2019-09-19 LAB — NOVEL CORONAVIRUS, NAA: SARS-CoV-2, NAA: NOT DETECTED

## 2019-09-26 ENCOUNTER — Other Ambulatory Visit: Payer: Self-pay | Admitting: Family Medicine

## 2019-10-10 ENCOUNTER — Other Ambulatory Visit: Payer: Self-pay | Admitting: Hematology

## 2019-10-10 DIAGNOSIS — C50511 Malignant neoplasm of lower-outer quadrant of right female breast: Secondary | ICD-10-CM

## 2019-10-10 NOTE — Progress Notes (Signed)
[place]   [phone] [phone]   Clinic Follow up Note   Patient Care Team: [name], MD as PCP - General (Family Medicine) [name], MD (Inactive) as Consulting Physician (General Surgery) [name], MD as Consulting Physician (Hematology) [name], MD as Attending Physician (Radiation Oncology) [name], NP as Nurse Practitioner (Hematology and Oncology)  Date of Service:  10/17/2019  CHIEF COMPLAINT: Follow up for right multifocal breast cancer  SUMMARY OF ONCOLOGIC HISTORY: Oncology History Overview Note  Malignant neoplasm of lower-outer quadrant of right female breast [facility])   Staging form: Breast, AJCC 7th Edition   - Clinical stage from 06/23/2016: Stage IA (T1c, N0, M0) - Unsigned    Malignant neoplasm of lower-outer quadrant of right female breast ([place])  04/28/2015 Imaging   Bone Density Report 04/28/2015 [facility] Spine (L1-L4): T-Score = -1.7. Osteopenia Femoral Neck (Left) : T-Score = -1.0 Normal Femoral Neck (Right): T-Score = 0.4 Normal   03/01/2016 Mammogram   Bilateral screening mammogram was negative   06/08/2016 Imaging   Bilateral breast MRI with and without contrast showed 4 lesions in the right breast, 2 adjacent 1.4 cm mass in the inferior aspect of the right breast, and in the right lateral aspect of the breast, there are 8 mm and a 7 mm irregular enhancing mass. Multiple small enhancing lesions within the liver, indeterminate. No axillary adenopathy.   06/16/2016 Initial Diagnosis   Malignant neoplasm of lower-outer quadrant of right female breast ([place])   06/16/2016 Initial Biopsy   Right breast needle core biopsy at 9 clock, 6:30 clock, 6 clock showed invasive lobular carcinoma, and LCIS   06/16/2016 Receptors her2   two right breast biopsy showed ER strongly positive, PR strongly positive, HER-2 negative, Ki-67 5-10%    06/16/2016 Imaging   Right breast and axilla ultrasound  showed a 1.4 cm mass at the 6:30 o'clock, 1.0 cm mass at the 6:00, and 63m mass at 9:00 position. Axilla was negative.   06/28/2016 Imaging   MRI ABDOMEN W W/O CONTRAST 06/28/2016 IMPRESSION: 1. Multiple hepatic hemangiomas.  No suspicious liver lesion. 2. Hepatic steatosis. 3. No evidence of abdominal metastasis.   06/28/2016 Genetic Testing   Patient has genetic testing done for breast cancer. Results revealed patient has the following variant called, MSH2 [id number].   08/13/2016 Surgery   Bilateral simple mastectomy and right SLN biopsy     08/13/2016 Pathology Results   Right mastectomy showed invasive ductal carcinoma, G2, multifocal, 1.5cm, 1.5cm, and 1.0cm. (+) LCIS, margins are negative, 1 sentinel lymph node was negative. Left simple mastectomy showed LCIS, ADH, no malignancy.    08/13/2016 Oncotype testing   Oncotype recurrence score 11, which predicts 10 year distant recurrence risk of 7% with tamoxifen alone, low risk category. No adjuvant chemo recommended based on this.    09/25/2016 -  Anti-estrogen oral therapy   Adjuvant exemestane 249mdaily    11/05/2016 Surgery   REMOVAL OF BILATERAL TISSUE EXPANDERS WITH PLACEMENT OF BILATERAL BREAST SALINE IMPLANTS by Dr. ThIran Planas     CURRENT THERAPY:  Exemestane (Aromasin) 25 mg daily started on September 25, 2016, Plan for 5-7 years.   INTERVAL HISTORY:  PaMARCELINA MCLAURINs here for a follow up of right breast cancer. She was last seen by me 6 months ago. She presents to the clinic alone. She notes she is doing well. She denies any new issues. She notes having joint pain in her hands. She notes  this mostly happens when she moved her wrist, fingers. She notes she after being pushed by her horse. She had pain of her right hip from it. She notes she was given flexeril but that did not help. She notes she does have hot flashes as well, mainly at night. She notes was seen by her PCP and her A1c was 7 and was started on Januvia in  05/2019. She notes she sees Dr. Iran Planas once a year.     REVIEW OF SYSTEMS:   Constitutional: Denies fevers, chills or abnormal weight loss (+) hot flashes, mainly at night.  Eyes: Denies blurriness of vision Ears, nose, mouth, throat, and face: Denies mucositis or sore throat Respiratory: Denies cough, dyspnea or wheezes Cardiovascular: Denies palpitation, chest discomfort or lower extremity swelling Gastrointestinal:  Denies nausea, heartburn or change in bowel habits Skin: Denies abnormal skin rashes MSK: (+) joint pain in her hands (+) right hip pain  Lymphatics: Denies new lymphadenopathy or easy bruising Neurological:Denies numbness, tingling or new weaknesses Behavioral/Psych: Mood is stable, no new changes  All other systems were reviewed with the patient and are negative.  MEDICAL HISTORY:  Past Medical History:  Diagnosis Date  . Anxiety   . Arthritis    OA hips  . Asthma    allergy and exercise induced asthma   . Depression   . Diabetes mellitus without complication ([place])   . Family history of breast cancer   . Family history of colon cancer   . Family history of stomach cancer   . GERD (gastroesophageal reflux disease)   . Hypertension   . Malignant neoplasm of lower-outer quadrant of right female breast ([place]) 06/21/2016  . Seasonal allergies     SURGICAL HISTORY: Past Surgical History:  Procedure Laterality Date  . BREAST LUMPECTOMY     right   . BREAST RECONSTRUCTION WITH PLACEMENT OF TISSUE EXPANDER AND FLEX HD (ACELLULAR HYDRATED DERMIS) Bilateral 08/13/2016   Procedure: BREAST RECONSTRUCTION WITH PLACEMENT OF TISSUE EXPANDER AND ALLODERM;  Surgeon: Irene Limbo, MD;  Location: [place];  Service: Plastics;  Laterality: Bilateral;  . COLONOSCOPY N/A 10/31/2017   Procedure: COLONOSCOPY;  Surgeon: Danie Binder, MD;  [facility];  Service: Endoscopy;  Laterality: N/A;  8:30 Am  . DILATION AND CURETTAGE OF UTERUS    . JOINT  REPLACEMENT    . LIPOSUCTION WITH LIPOFILLING Bilateral 11/05/2016   Procedure: LIPOFILLING FROM ABDOMEN TO BILATERAL CHEST;  Surgeon: Irene Limbo, MD;  Location: [place];  Service: Plastics;  Laterality: Bilateral;  . MASTECTOMY W/ SENTINEL NODE BIOPSY Bilateral 08/13/2016   Procedure: BILATERAL TOTAL MASTECTOMY WITH RIGHT AXILLARY SENTINEL LYMPH NODE BIOPSY;  Surgeon: [name], MD;  Location: [place];  Service: General;  Laterality: Bilateral;  . REMOVAL OF BILATERAL TISSUE EXPANDERS WITH PLACEMENT OF BILATERAL BREAST IMPLANTS Bilateral 11/05/2016   Procedure: REMOVAL OF BILATERAL TISSUE EXPANDERS WITH PLACEMENT OF BILATERAL BREAST SALINE IMPLANTS;  Surgeon: Irene Limbo, MD;  Location: [place];  Service: Plastics;  Laterality: Bilateral;  . TOTAL HIP ARTHROPLASTY Right 01/07/2016   Procedure: TOTAL HIP ARTHROPLASTY ANTERIOR APPROACH;  Surgeon: Gaynelle Arabian, MD;  Location: [facility];  Service: Orthopedics;  Laterality: Right;  . TOTAL HIP ARTHROPLASTY Left 03/22/2018   Procedure: LEFT TOTAL HIP ARTHROPLASTY ANTERIOR APPROACH;  Surgeon: Gaynelle Arabian, MD;  Location: [facility];  Service: Orthopedics;  Laterality: Left;    I have reviewed the social history and family history with the patient  and they are unchanged from previous note.  ALLERGIES:  is allergic to corn-containing products; tomato; azithromycin; biaxin [clarithromycin]; and codeine.  MEDICATIONS:  Current Outpatient Medications  Medication Sig Dispense Refill  . benazepril (LOTENSIN) 20 MG tablet TAKE (1) TABLET BY MOUTH TWICE DAILY. 180 tablet 0  . cyclobenzaprine (FLEXERIL) 10 MG tablet TAKE ONE TABLET BY MOUTH 3 TIMES DAILY AS NEEDED FOR MUSCLE SPASM(S). 30 tablet 0  . diclofenac Sodium (VOLTAREN) 1 % GEL Apply 1 application topically as needed.    Marland Kitchen escitalopram (LEXAPRO) 20 MG tablet TAKE ONE TABLET BY MOUTH ONCE DAILY. 90 tablet 0  . exemestane (AROMASIN) 25 MG  tablet Take 1 tablet (25 mg total) by mouth daily after breakfast. 90 tablet 3  . fexofenadine (ALLEGRA) 60 MG tablet Take 60 mg by mouth 2 (two) times daily.    Marland Kitchen JANUVIA 100 MG tablet TAKE (1) TABLET BY MOUTH ONCE DAILY. 30 tablet 1  . metFORMIN (GLUCOPHAGE) 1000 MG tablet TAKE 1 TABLET BY MOUTH TWICE DAILY WITH MEALS. 180 tablet 1  . omeprazole (PRILOSEC) 20 MG capsule TAKE (1) CAPSULE BY MOUTH ONCE DAILY. 90 capsule 3   No current facility-administered medications for this visit.    PHYSICAL EXAMINATION: ECOG PERFORMANCE STATUS: 1 - Symptomatic but completely ambulatory  Vitals:   10/17/19 1433  BP: 139/69  Pulse: 70  Resp: 18  Temp: 98 F (36.7 C)  SpO2: 98%   Filed Weights   10/17/19 1433  Weight: 170 lb 1.6 oz (77.2 kg)    GENERAL:alert, no distress and comfortable SKIN: skin color, texture, turgor are normal, no rashes or significant lesions EYES: normal, Conjunctiva are pink and non-injected, sclera clear  NECK: supple, thyroid normal size, non-tender, without nodularity LYMPH:  no palpable lymphadenopathy in the cervical, axillary  LUNGS: clear to auscultation and percussion with normal breathing effort HEART: regular rate & rhythm and no murmurs and no lower extremity edema ABDOMEN:abdomen soft, non-tender and normal bowel sounds Musculoskeletal:no cyanosis of digits and no clubbing  NEURO: alert & oriented x 3 with fluent speech, no focal motor/sensory deficits BREAST: S/p b/l mastectomy and reconstruction surgery, implants in place: surgical incision healed well. No palpable mass, nodules or adenopathy bilaterally. Breast exam benign.   LABORATORY DATA:  I have reviewed the data as listed CBC Latest Ref Rng & Units 10/17/2019 04/27/2019 04/16/2019  WBC 4.0 - 10.5 K/uL 5.9 5.2 5.4  Hemoglobin 12.0 - 15.0 g/dL 13.2 13.2 13.2  Hematocrit 36.0 - 46.0 % 40.0 39.8 40.6  Platelets 150 - 400 K/uL 292 313 304     CMP Latest Ref Rng & Units 10/17/2019 04/27/2019  04/16/2019  Glucose 70 - 99 mg/dL 128(H) 117(H) 148(H)  BUN 8 - 23 mg/dL 10 11 11   Creatinine 0.44 - 1.00 mg/dL 0.77 0.67 0.94  Sodium 135 - 145 mmol/L 141 138 140  Potassium 3.5 - 5.1 mmol/L 4.4 4.8 4.3  Chloride 98 - 111 mmol/L 103 103 104  CO2 22 - 32 mmol/L 29 26 23   Calcium 8.9 - 10.3 mg/dL 9.4 9.5 9.4  Total Protein 6.5 - 8.1 g/dL 7.3 6.7 7.2  Total Bilirubin 0.3 - 1.2 mg/dL 0.3 0.2 0.3  Alkaline Phos 38 - 126 U/L 109 - 114  AST 15 - 41 U/L 19 16 20   ALT 0 - 44 U/L 25 24 30       RADIOGRAPHIC STUDIES: I have personally reviewed the radiological images as listed and agreed with the findings in the report. No results  found.   ASSESSMENT & PLAN:  BELL CARBO is a 64 y.o. female with   1. Malignant neoplasm of lower-outer quadrant of right female breast, invasive lobular carcinoma and LCIS, mpT1cN0M0, stage IA, G2, ER+/PR+/HER2-, oncotype low risk -She was diagnosed in 06/2016. She is s/p b/l mastectomy and breast reconstruction surgery. -Oncotype DX score was 11 which is at low risk. Therefore I do not recommend chemotherapy. Given she was node negative post-mastectomyradiation was not recommended.  -She has been on anti-estrogen therapy with exemestane since 09/2016. Plan for total 5-7years.Tolerating well with manageablejoint pain and hot flashes.  -She is clinically doing well. Lab reviewed, her CBC and CMP are within normal limits except BG 128. Her physical exam was unremarkable. There is no clinical concern for recurrence. -Continue surveillance. No need for mammogram given s/p b/l mastectomy. I discussed no body scan is indicated unless she develops concerning symptoms.  -Continue Exemestane.  -Phone visit in 4 months. OV in 8 months    2. Genetics -Genetic testing did identify a VUS in MSH2, c.2650A>T. -I also reviewed keeping up with age appropriate cancer screenings. Her last colonoscopy showed beginning stages of diverticulitis.   3. DM, type 2 -On  metformin and Lotensin -Her 05/2019 A1c was in 7 range. Her PCP started her on Junuvia.  -I recommend she f/u with PCP and get Glucose meter to monitor her BG at home.   4. Arthritis and arthralgia -Mild joint pain in b/l fingers and left wrists, manageable.  -She also has right hip pain from being pushed down by her horse. Flexeril did not help.  -I discussed she should be fine to use ibuprofen and Aleve for joint pain 1-2 times a day. I also recommend stretching and exercise.   5. Osteopenia  -Bone scan 04/28/15 revealed [facility] Spine (L1-L4) T-Score = -1.7. Her 05/31/18 DEXA showed lowest T-score at -1.9, slightly worse than previous scan.  -We discussed that AI may weak bone -Since6/13/18elevated calcium level she is fine to not to take anymore supplemental calcium. Will continue Vitamin D.  -Continue to monitor with DEXA every 2 years, next in 05/2020.    PLAN -Continue Exemestane, refill today -Phone visit in 4 months  -Lab and F/u in 8 months   No problem-specific Assessment & Plan notes found for this encounter.   No orders of the defined types were placed in this encounter.  All questions were answered. The patient knows to call the clinic with any problems, questions or concerns. No barriers to learning was detected. The total time spent in the appointment was 25 minutes.     [name], MD 10/17/2019   I, Joslyn Devon, am acting as scribe for [name], MD.   I have reviewed the above documentation for accuracy and completeness, and I agree with the above.

## 2019-10-17 ENCOUNTER — Other Ambulatory Visit: Payer: Self-pay

## 2019-10-17 ENCOUNTER — Inpatient Hospital Stay (HOSPITAL_BASED_OUTPATIENT_CLINIC_OR_DEPARTMENT_OTHER): Payer: BC Managed Care – PPO | Admitting: Hematology

## 2019-10-17 ENCOUNTER — Encounter: Payer: Self-pay | Admitting: Hematology

## 2019-10-17 ENCOUNTER — Inpatient Hospital Stay: Payer: BC Managed Care – PPO | Attending: Hematology

## 2019-10-17 VITALS — BP 139/69 | HR 70 | Temp 98.0°F | Resp 18 | Ht 63.0 in | Wt 170.1 lb

## 2019-10-17 DIAGNOSIS — Z170 Estrogen receptor positive status [ER+]: Secondary | ICD-10-CM | POA: Insufficient documentation

## 2019-10-17 DIAGNOSIS — I10 Essential (primary) hypertension: Secondary | ICD-10-CM | POA: Insufficient documentation

## 2019-10-17 DIAGNOSIS — R232 Flushing: Secondary | ICD-10-CM | POA: Insufficient documentation

## 2019-10-17 DIAGNOSIS — F418 Other specified anxiety disorders: Secondary | ICD-10-CM | POA: Insufficient documentation

## 2019-10-17 DIAGNOSIS — C50511 Malignant neoplasm of lower-outer quadrant of right female breast: Secondary | ICD-10-CM | POA: Diagnosis not present

## 2019-10-17 DIAGNOSIS — Z9013 Acquired absence of bilateral breasts and nipples: Secondary | ICD-10-CM | POA: Insufficient documentation

## 2019-10-17 DIAGNOSIS — E119 Type 2 diabetes mellitus without complications: Secondary | ICD-10-CM | POA: Diagnosis not present

## 2019-10-17 DIAGNOSIS — Z7984 Long term (current) use of oral hypoglycemic drugs: Secondary | ICD-10-CM | POA: Diagnosis not present

## 2019-10-17 DIAGNOSIS — M8588 Other specified disorders of bone density and structure, other site: Secondary | ICD-10-CM

## 2019-10-17 DIAGNOSIS — M25551 Pain in right hip: Secondary | ICD-10-CM | POA: Insufficient documentation

## 2019-10-17 DIAGNOSIS — Z79811 Long term (current) use of aromatase inhibitors: Secondary | ICD-10-CM | POA: Insufficient documentation

## 2019-10-17 DIAGNOSIS — M8580 Other specified disorders of bone density and structure, unspecified site: Secondary | ICD-10-CM | POA: Insufficient documentation

## 2019-10-17 DIAGNOSIS — Z79899 Other long term (current) drug therapy: Secondary | ICD-10-CM | POA: Diagnosis not present

## 2019-10-17 LAB — COMPREHENSIVE METABOLIC PANEL
ALT: 25 U/L (ref 0–44)
AST: 19 U/L (ref 15–41)
Albumin: 4.1 g/dL (ref 3.5–5.0)
Alkaline Phosphatase: 109 U/L (ref 38–126)
Anion gap: 9 (ref 5–15)
BUN: 10 mg/dL (ref 8–23)
CO2: 29 mmol/L (ref 22–32)
Calcium: 9.4 mg/dL (ref 8.9–10.3)
Chloride: 103 mmol/L (ref 98–111)
Creatinine, Ser: 0.77 mg/dL (ref 0.44–1.00)
GFR calc Af Amer: >60 mL/min (ref >60)
GFR calc non Af Amer: >60 mL/min (ref >60)
Glucose, Bld: 128 mg/dL — ABNORMAL HIGH (ref 70–99)
Potassium: 4.4 mmol/L (ref 3.5–5.1)
Sodium: 141 mmol/L (ref 135–145)
Total Bilirubin: 0.3 mg/dL (ref 0.3–1.2)
Total Protein: 7.3 g/dL (ref 6.5–8.1)

## 2019-10-17 LAB — CBC WITH DIFFERENTIAL/PLATELET
Abs Immature Granulocytes: 0.02 10*3/uL (ref 0.00–0.07)
Basophils Absolute: 0 10*3/uL (ref 0.0–0.1)
Basophils Relative: 0 %
Eosinophils Absolute: 0.1 10*3/uL (ref 0.0–0.5)
Eosinophils Relative: 2 %
HCT: 40 % (ref 36.0–46.0)
Hemoglobin: 13.2 g/dL (ref 12.0–15.0)
Immature Granulocytes: 0 %
Lymphocytes Relative: 30 %
Lymphs Abs: 1.8 10*3/uL (ref 0.7–4.0)
MCH: 31.1 pg (ref 26.0–34.0)
MCHC: 33 g/dL (ref 30.0–36.0)
MCV: 94.3 fL (ref 80.0–100.0)
Monocytes Absolute: 0.6 10*3/uL (ref 0.1–1.0)
Monocytes Relative: 10 %
Neutro Abs: 3.4 10*3/uL (ref 1.7–7.7)
Neutrophils Relative %: 58 %
Platelets: 292 10*3/uL (ref 150–400)
RBC: 4.24 MIL/uL (ref 3.87–5.11)
RDW: 13.2 % (ref 11.5–15.5)
WBC: 5.9 10*3/uL (ref 4.0–10.5)
nRBC: 0 % (ref 0.0–0.2)

## 2019-10-17 MED ORDER — EXEMESTANE 25 MG PO TABS: 25.0000 mg | ORAL_TABLET | Freq: Every day | ORAL | 3 refills | Status: DC

## 2019-10-18 ENCOUNTER — Telehealth: Payer: Self-pay | Admitting: Hematology

## 2019-10-18 NOTE — Telephone Encounter (Signed)
Scheduled appt per 2/10 los.  Sent a message to HIM pool to get a calendar mailed out. 

## 2019-10-31 ENCOUNTER — Other Ambulatory Visit: Payer: Self-pay | Admitting: Family Medicine

## 2019-10-31 DIAGNOSIS — K219 Gastro-esophageal reflux disease without esophagitis: Secondary | ICD-10-CM

## 2019-11-07 ENCOUNTER — Other Ambulatory Visit: Payer: Self-pay | Admitting: Family Medicine

## 2019-11-14 ENCOUNTER — Other Ambulatory Visit: Payer: Self-pay | Admitting: Family Medicine

## 2019-11-28 ENCOUNTER — Other Ambulatory Visit: Payer: Self-pay | Admitting: Family Medicine

## 2019-11-28 DIAGNOSIS — K219 Gastro-esophageal reflux disease without esophagitis: Secondary | ICD-10-CM

## 2019-12-12 ENCOUNTER — Other Ambulatory Visit: Payer: Self-pay | Admitting: Family Medicine

## 2019-12-25 ENCOUNTER — Other Ambulatory Visit: Payer: Self-pay | Admitting: Family Medicine

## 2020-01-02 ENCOUNTER — Other Ambulatory Visit: Payer: Self-pay | Admitting: Family Medicine

## 2020-01-02 DIAGNOSIS — K219 Gastro-esophageal reflux disease without esophagitis: Secondary | ICD-10-CM

## 2020-01-10 ENCOUNTER — Other Ambulatory Visit: Payer: Self-pay | Admitting: Family Medicine

## 2020-01-30 ENCOUNTER — Other Ambulatory Visit: Payer: Self-pay | Admitting: Family Medicine

## 2020-01-30 DIAGNOSIS — K219 Gastro-esophageal reflux disease without esophagitis: Secondary | ICD-10-CM

## 2020-02-11 ENCOUNTER — Other Ambulatory Visit: Payer: Self-pay | Admitting: Family Medicine

## 2020-02-13 NOTE — Progress Notes (Signed)
[place]   [phone] Fax:(336) [phone]   Clinic Follow up Note   Patient Care Team: [name], MD as PCP - General (Family Medicine) [name], MD (Inactive) as Consulting Physician (General Surgery) [name], MD as Consulting Physician (Hematology) [name], MD as Attending Physician (Radiation Oncology) [name], NP as Nurse Practitioner (Hematology and Oncology)   I connected with Carolyn Glass on 02/14/2020 at  2:20 PM EDT by video enabled telemedicine visit and verified that I am speaking with the correct person using two identifiers.  I discussed the limitations, risks, security and privacy concerns of performing an evaluation and management service by telephone and the availability of in person appointments. I also discussed with the patient that there may be a patient responsible charge related to this service. The patient expressed understanding and agreed to proceed.   Other persons participating in the visit and their role in the encounter:  None   Patient's location:  Her home  Provider's location:  My Office   CHIEF COMPLAINT: Follow up for right multifocal breast cancer  SUMMARY OF ONCOLOGIC HISTORY: Oncology History Overview Note  Malignant neoplasm of lower-outer quadrant of right female breast [facility])   Staging form: Breast, AJCC 7th Edition   - Clinical stage from 06/23/2016: Stage IA (T1c, N0, M0) - Unsigned    Malignant neoplasm of lower-outer quadrant of right female breast ([place])  04/28/2015 Imaging   Bone Density Report 04/28/2015 AP Spine (L1-L4): T-Score = -1.7. Osteopenia Femoral Neck (Left) : T-Score = -1.0 Normal Femoral Neck (Right): T-Score = 0.4 Normal   03/01/2016 Mammogram   Bilateral screening mammogram was negative   06/08/2016 Imaging   Bilateral breast MRI with and without contrast showed 4 lesions in the right breast, 2 adjacent 1.4 cm mass in the inferior aspect of the  right breast, and in the right lateral aspect of the breast, there are 8 mm and a 7 mm irregular enhancing mass. Multiple small enhancing lesions within the liver, indeterminate. No axillary adenopathy.   06/16/2016 Initial Diagnosis   Malignant neoplasm of lower-outer quadrant of right female breast ([place])   06/16/2016 Initial Biopsy   Right breast needle core biopsy at 9 clock, 6:30 clock, 6 clock showed invasive lobular carcinoma, and LCIS   06/16/2016 Receptors her2   two right breast biopsy showed ER strongly positive, PR strongly positive, HER-2 negative, Ki-67 5-10%    06/16/2016 Imaging   Right breast and axilla ultrasound showed a 1.4 cm mass at the 6:30 o'clock, 1.0 cm mass at the 6:00, and 76m mass at 9:00 position. Axilla was negative.   06/28/2016 Imaging   MRI ABDOMEN W W/O CONTRAST 06/28/2016 IMPRESSION: 1. Multiple hepatic hemangiomas.  No suspicious liver lesion. 2. Hepatic steatosis. 3. No evidence of abdominal metastasis.   06/28/2016 Genetic Testing   Patient has genetic testing done for breast cancer. Results revealed patient has the following variant called, MSH2 [id number].   08/13/2016 Surgery   Bilateral simple mastectomy and right SLN biopsy     08/13/2016 Pathology Results   Right mastectomy showed invasive ductal carcinoma, G2, multifocal, 1.5cm, 1.5cm, and 1.0cm. (+) LCIS, margins are negative, 1 sentinel lymph node was negative. Left simple mastectomy showed LCIS, ADH, no malignancy.    08/13/2016 Oncotype testing   Oncotype recurrence score 11, which predicts 10 year distant recurrence risk of 7% with tamoxifen alone, low risk category. No adjuvant chemo recommended based on this.    09/25/2016 -  Anti-estrogen oral therapy   Adjuvant exemestane 37m daily    11/05/2016 Surgery   REMOVAL OF BILATERAL TISSUE EXPANDERS WITH PLACEMENT OF BILATERAL BREAST SALINE IMPLANTS by Dr. TIran Planas      CURRENT THERAPY:  Exemestane (Aromasin) 25 mg daily  started on September 25, 2016, Plan for 5-7 years.  INTERVAL HISTORY:  Carolyn TILLERis here for a follow up of right breast cancer. She identified herself by face to face video. She noes she is having numbness of her toes and pain in the ball of her foot along with pain in her fingers. She notes she has arthritis in a few of her fingers and at night time having occasional big toe pain and numbness. She denies this limiting her except opening certain tight lids. She has ben diabetic for several years. She plans to see her PCP. She notes she restart Januvia 1 year ago.    REVIEW OF SYSTEMS:   Constitutional: Denies fevers, chills or abnormal weight loss Eyes: Denies blurriness of vision Ears, nose, mouth, throat, and face: Denies mucositis or sore throat Respiratory: Denies cough, dyspnea or wheezes Cardiovascular: Denies palpitation, chest discomfort or lower extremity swelling Gastrointestinal:  Denies nausea, heartburn or change in bowel habits Skin: Denies abnormal skin rashes MSK: (+) Arthritis of fingers  Lymphatics: Denies new lymphadenopathy or easy bruising Neurological: (+) numbness of her toes and pain in the ball of her foot along with pain in her fingers Behavioral/Psych: Mood is stable, no new changes  All other systems were reviewed with the patient and are negative.  MEDICAL HISTORY:  Past Medical History:  Diagnosis Date  . Anxiety   . Arthritis    OA hips  . Asthma    allergy and exercise induced asthma   . Depression   . Diabetes mellitus without complication ([place]   . Family history of breast cancer   . Family history of colon cancer   . Family history of stomach cancer   . GERD (gastroesophageal reflux disease)   . Hypertension   . Malignant neoplasm of lower-outer quadrant of right female breast ([place] 06/21/2016  . Seasonal allergies     SURGICAL HISTORY: Past Surgical History:  Procedure Laterality Date  . BREAST LUMPECTOMY     right   . BREAST  RECONSTRUCTION WITH PLACEMENT OF TISSUE EXPANDER AND FLEX HD (ACELLULAR HYDRATED DERMIS) Bilateral 08/13/2016   Procedure: BREAST RECONSTRUCTION WITH PLACEMENT OF TISSUE EXPANDER AND ALLODERM;  Surgeon: BIrene Limbo MD;  [place] [facility]  Service: Plastics;  Laterality: Bilateral;  . COLONOSCOPY N/A 10/31/2017   Procedure: COLONOSCOPY;  Surgeon: FDanie Binder MD;  [facility];  Service: Endoscopy;  Laterality: N/A;  8:30 Am  . DILATION AND CURETTAGE OF UTERUS    . JOINT REPLACEMENT    . LIPOSUCTION WITH LIPOFILLING Bilateral 11/05/2016   Procedure: LIPOFILLING FROM ABDOMEN TO BILATERAL CHEST;  Surgeon: BIrene Limbo MD;  [place] [facility]  Service: Plastics;  Laterality: Bilateral;  . MASTECTOMY W/ SENTINEL NODE BIOPSY Bilateral 08/13/2016   Procedure: BILATERAL TOTAL MASTECTOMY WITH RIGHT AXILLARY SENTINEL LYMPH NODE BIOPSY;  Surgeon: BExcell Seltzer MD;  [place]  Service: General;  Laterality: Bilateral;  . REMOVAL OF BILATERAL TISSUE EXPANDERS WITH PLACEMENT OF BILATERAL BREAST IMPLANTS Bilateral 11/05/2016   Procedure: REMOVAL OF BILATERAL TISSUE EXPANDERS WITH PLACEMENT OF BILATERAL BREAST SALINE IMPLANTS;  Surgeon: BIrene Limbo MD;  [place] [facility]  Service: Plastics;  Laterality:  Bilateral;  . TOTAL HIP ARTHROPLASTY Right 01/07/2016   Procedure: TOTAL HIP ARTHROPLASTY ANTERIOR APPROACH;  Surgeon: Gaynelle Arabian, MD;  [facility];  Service: Orthopedics;  Laterality: Right;  . TOTAL HIP ARTHROPLASTY Left 03/22/2018   Procedure: LEFT TOTAL HIP ARTHROPLASTY ANTERIOR APPROACH;  Surgeon: Gaynelle Arabian, MD;  [facility];  Service: Orthopedics;  Laterality: Left;    I have reviewed the social history and family history with the patient and they are unchanged from previous note.  ALLERGIES:  is allergic to corn-containing products, tomato, azithromycin, biaxin [clarithromycin],  and codeine.  MEDICATIONS:  Current Outpatient Medications  Medication Sig Dispense Refill  . benazepril (LOTENSIN) 20 MG tablet TAKE (1) TABLET BY MOUTH TWICE DAILY. 180 tablet 0  . cyclobenzaprine (FLEXERIL) 10 MG tablet TAKE ONE TABLET BY MOUTH 3 TIMES DAILY AS NEEDED FOR MUSCLE SPASM(S). 30 tablet 0  . diclofenac Sodium (VOLTAREN) 1 % GEL Apply 1 application topically as needed.    Marland Kitchen escitalopram (LEXAPRO) 20 MG tablet TAKE ONE TABLET BY MOUTH ONCE DAILY. 90 tablet 3  . exemestane (AROMASIN) 25 MG tablet Take 1 tablet (25 mg total) by mouth daily after breakfast. 90 tablet 3  . fexofenadine (ALLEGRA) 60 MG tablet Take 60 mg by mouth 2 (two) times daily.    Marland Kitchen JANUVIA 100 MG tablet TAKE (1) TABLET BY MOUTH ONCE DAILY. 30 tablet 0  . metFORMIN (GLUCOPHAGE) 1000 MG tablet TAKE 1 TABLET BY MOUTH TWICE DAILY WITH MEALS. 180 tablet 0  . omeprazole (PRILOSEC) 20 MG capsule TAKE (1) CAPSULE BY MOUTH ONCE DAILY. 30 capsule 0   No current facility-administered medications for this visit.    PHYSICAL EXAMINATION: ECOG PERFORMANCE STATUS: 1 - Symptomatic but completely ambulatory  No vitals taken today, Exam not performed today   LABORATORY DATA:  I have reviewed the data as listed CBC Latest Ref Rng & Units 10/17/2019 04/27/2019 04/16/2019  WBC 4.0 - 10.5 K/uL 5.9 5.2 5.4  Hemoglobin 12.0 - 15.0 g/dL 13.2 13.2 13.2  Hematocrit 36 - 46 % 40.0 39.8 40.6  Platelets 150 - 400 K/uL 292 313 304     CMP Latest Ref Rng & Units 10/17/2019 04/27/2019 04/16/2019  Glucose 70 - 99 mg/dL 128(H) 117(H) 148(H)  BUN 8 - 23 mg/dL _0 Creatinine 0.44 - 1.00 mg/dL 0.77 0.67 0.94  Sodium 135 - 145 mmol/L 141 138 140  Potassium 3.5 - 5.1 mmol/L 4.4 4.8 4.3  Chloride 98 - 111 mmol/L 103 103 104  CO2 22 - 32 mmol/L _1 Calcium 8.9 - 10.3 mg/dL 9.4 9.5 9.4  Total Protein 6.5 - 8.1 g/dL 7.3 6.7 7.2  Total Bilirubin 0.3 - 1.2 mg/dL 0.3 0.2 0.3  Alkaline Phos 38 - 126 U/L 109 - 114  AST 15 - 41 U/L  _2 ALT 0 - 44 U/L _3 RADIOGRAPHIC STUDIES: I have personally reviewed the radiological images as listed and agreed with the findings in the report. No results found.   ASSESSMENT & PLAN:  Carolyn Glass is a 64 y.o. female with    1. Malignant neoplasm of lower-outer quadrant of right female breast, invasive lobular carcinoma and LCIS, mpT1cN0M0, stage IA, G2, ER+/PR+/HER2-, oncotype low risk -She was diagnosed in 06/2016. She is s/p b/l mastectomy and breast reconstruction surgery. -Oncotype DX score was 11 which is at low risk. Therefore I do not recommend chemotherapy. Given she was node negative  post-mastectomyradiation was not recommended.  -She has been on anti-estrogen therapy with exemestane since 09/2016. Plan for total 5-7years.Tolerating well with manageablejoint pain and hot flashes.  -She has recent numbness of toes and pain in foot and fingers. I discussed this could a combination of neuropathy from her DM and her arthritis. It is hard to indicate if this arthritis is related to Exemestane or her age. Will considering holding Exemestane if needed.  -She is clinically doing well and has no concerns. There is no clinical concern for recurrence.  -Continue surveillance. No need for mammogram given s/p b/l mastectomy. -Continue Exemestane.  -F/u in 4 months with lab    2. Genetics -Genetic testing did identify a VUS in MSH2, c.2650A>T. -I also reviewed keeping up with age appropriate cancer screenings. Her last colonoscopy showed beginning stages of diverticulitis.  3. DM, type 2 -On metformin and Lotensin -Her 05/2019 A1c was in 7 range. Her PCP started her on Junuvia.  -I recommend she f/u with PCP and get Glucose meter to monitor her BG at home.   4. Arthritis and arthralgia -Mild joint pain in b/l fingers and left wrists, manageable.  -She also has right hip pain from being pushed down by her horse. Flexeril did not help.  -I discussed  she should be fine to use ibuprofen and Aleve for joint pain 1-2 times a day. -She notes recent numbness of her toes and pain in the ball of her foot along with pain in her fingers. I discussed this could a combination of neuropathy from her DM and her arthritis. It is hard to indicate if this arthritis is related to Exemestane or her age.  -This has been mostly manageable and she will f/u with her PCP. I also suggest she take Vit B12 and to exercise more.    5. Osteopenia  -Bone scan 04/28/15 revealed AP Spine (L1-L4) T-Score = -1.7. Her 05/31/18 DEXA showed lowest T-score at -1.9, slightly worse than previous scan.  -We discussed that AI may weak bone -Will continue Vitamin D.She is fine to take Calcium 500-600 units daily as her Calcium level has been normal lately.  -Continue to monitor with DEXA every 2 years, next in 05/2020.    PLAN -Continue Exemestane -Lab and F/u in 4 months    No problem-specific Assessment & Plan notes found for this encounter.   No orders of the defined types were placed in this encounter.  I discussed the assessment and treatment plan with the patient. The patient was provided an opportunity to ask questions and all were answered. The patient agreed with the plan and demonstrated an understanding of the instructions.  The patient was advised to call back or seek an in-person evaluation if the symptoms worsen or if the condition fails to improve as anticipated.  The total time spent in the appointment was 22 minutes.    [name], MD 02/14/2020   I, Joslyn Devon, am acting as scribe for [name], MD.   I have reviewed the above documentation for accuracy and completeness, and I agree with the above.

## 2020-02-14 ENCOUNTER — Encounter: Payer: Self-pay | Admitting: Hematology

## 2020-02-14 ENCOUNTER — Inpatient Hospital Stay: Payer: BC Managed Care – PPO | Attending: Hematology | Admitting: Hematology

## 2020-02-14 ENCOUNTER — Other Ambulatory Visit: Payer: Self-pay | Admitting: Family Medicine

## 2020-02-14 DIAGNOSIS — C50511 Malignant neoplasm of lower-outer quadrant of right female breast: Secondary | ICD-10-CM

## 2020-02-14 DIAGNOSIS — Z170 Estrogen receptor positive status [ER+]: Secondary | ICD-10-CM | POA: Diagnosis not present

## 2020-02-19 ENCOUNTER — Other Ambulatory Visit: Payer: Self-pay

## 2020-02-19 ENCOUNTER — Ambulatory Visit (INDEPENDENT_AMBULATORY_CARE_PROVIDER_SITE_OTHER): Payer: BC Managed Care – PPO | Admitting: Family Medicine

## 2020-02-19 VITALS — BP 116/64 | HR 78 | Temp 99.3°F | Ht 63.0 in | Wt 172.0 lb

## 2020-02-19 DIAGNOSIS — E119 Type 2 diabetes mellitus without complications: Secondary | ICD-10-CM

## 2020-02-19 DIAGNOSIS — G609 Hereditary and idiopathic neuropathy, unspecified: Secondary | ICD-10-CM | POA: Diagnosis not present

## 2020-02-19 NOTE — Progress Notes (Signed)
Subjective:    Patient ID: Carolyn Glass, female    DOB: 08-Dec-1955, 64 y.o.   MRN: [id number]  Medication Refill    Patient is a 64 year old Caucasian female with a history of type 2 diabetes mellitus who presents today with numbness and occasional stinging pain in her left first and second toes.  She has diminished sensation to 10 g monofilament in the first and second toes.  She also has decreased sensation to vibration and temperature in the first and second toes.  She occasionally gets stinging and burning pain in these toes.  There is no obvious Morton's neuroma on exam and there is no pain in the webspace between them.  There is no redness or erythema.  There is no rash.  She has 2/4 dorsalis pedis and posterior tibialis pulses bilaterally.  She has normal reflexes and normal strength in her legs.  She has not been on chemotherapy for breast cancer although she is on Aromasin. Past Medical History:  Diagnosis Date  . Anxiety   . Arthritis    OA hips  . Asthma    allergy and exercise induced asthma   . Depression   . Diabetes mellitus without complication ([place])   . Family history of breast cancer   . Family history of colon cancer   . Family history of stomach cancer   . GERD (gastroesophageal reflux disease)   . Hypertension   . Malignant neoplasm of lower-outer quadrant of right female breast ([place]) 06/21/2016  . Seasonal allergies    Past Surgical History:  Procedure Laterality Date  . BREAST LUMPECTOMY     right   . BREAST RECONSTRUCTION WITH PLACEMENT OF TISSUE EXPANDER AND FLEX HD (ACELLULAR HYDRATED DERMIS) Bilateral 08/13/2016   Procedure: BREAST RECONSTRUCTION WITH PLACEMENT OF TISSUE EXPANDER AND ALLODERM;  Surgeon: Irene Limbo, MD;  Location: [place];  Service: Plastics;  Laterality: Bilateral;  . COLONOSCOPY N/A 10/31/2017   Procedure: COLONOSCOPY;  Surgeon: Danie Binder, MD;  [facility];  Service: Endoscopy;  Laterality: N/A;   8:30 Am  . DILATION AND CURETTAGE OF UTERUS    . JOINT REPLACEMENT    . LIPOSUCTION WITH LIPOFILLING Bilateral 11/05/2016   Procedure: LIPOFILLING FROM ABDOMEN TO BILATERAL CHEST;  Surgeon: Irene Limbo, MD;  Location: [place];  Service: Plastics;  Laterality: Bilateral;  . MASTECTOMY W/ SENTINEL NODE BIOPSY Bilateral 08/13/2016   Procedure: BILATERAL TOTAL MASTECTOMY WITH RIGHT AXILLARY SENTINEL LYMPH NODE BIOPSY;  Surgeon: [name], MD;  Location: [place];  Service: General;  Laterality: Bilateral;  . REMOVAL OF BILATERAL TISSUE EXPANDERS WITH PLACEMENT OF BILATERAL BREAST IMPLANTS Bilateral 11/05/2016   Procedure: REMOVAL OF BILATERAL TISSUE EXPANDERS WITH PLACEMENT OF BILATERAL BREAST SALINE IMPLANTS;  Surgeon: Irene Limbo, MD;  Location: [place];  Service: Plastics;  Laterality: Bilateral;  . TOTAL HIP ARTHROPLASTY Right 01/07/2016   Procedure: TOTAL HIP ARTHROPLASTY ANTERIOR APPROACH;  Surgeon: Gaynelle Arabian, MD;  [facility];  Service: Orthopedics;  Laterality: Right;  . TOTAL HIP ARTHROPLASTY Left 03/22/2018   Procedure: LEFT TOTAL HIP ARTHROPLASTY ANTERIOR APPROACH;  Surgeon: Gaynelle Arabian, MD;  [facility];  Service: Orthopedics;  Laterality: Left;   Current Outpatient Medications on File Prior to Visit  Medication Sig Dispense Refill  . benazepril (LOTENSIN) 20 MG tablet TAKE (1) TABLET BY MOUTH TWICE DAILY. 180 tablet 0  . escitalopram (LEXAPRO) 20 MG tablet TAKE ONE TABLET BY MOUTH ONCE DAILY. [place]  tablet 3  . exemestane (AROMASIN) 25 MG tablet Take 1 tablet (25 mg total) by mouth daily after breakfast. 90 tablet 3  . fexofenadine (ALLEGRA) 60 MG tablet Take 60 mg by mouth 2 (two) times daily.    Marland Kitchen JANUVIA 100 MG tablet TAKE (1) TABLET BY MOUTH ONCE DAILY. 30 tablet 0  . metFORMIN (GLUCOPHAGE) 1000 MG tablet TAKE 1 TABLET BY MOUTH TWICE DAILY WITH MEALS. 180 tablet 0  . omeprazole (PRILOSEC) 20 MG capsule TAKE  (1) CAPSULE BY MOUTH ONCE DAILY. 30 capsule 0   No current facility-administered medications on file prior to visit.   Allergies  Allergen Reactions  . Corn-Containing Products Itching    Running nose  . Tomato Itching    Running nose   . Azithromycin Itching and Rash  . Biaxin [Clarithromycin] Rash  . Codeine Rash    Thinks a codeine cough syrup caused rash.   Social History   Socioeconomic History  . Marital status: Married    Spouse name: Not on file  . Number of children: 1  . Years of education: Not on file  . Highest education level: Not on file  Occupational History  . Not on file  Tobacco Use  . Smoking status: Never Smoker  . Smokeless tobacco: Never Used  Vaping Use  . Vaping Use: Never used  Substance and Sexual Activity  . Alcohol use: No  . Drug use: No  . Sexual activity: Yes  Other Topics Concern  . Not on file  Social History Narrative  . Not on file   Social Determinants of Health   Financial Resource Strain:   . Difficulty of Paying Living Expenses:   Food Insecurity:   . Worried About Charity fundraiser in the Last Year:   . Arboriculturist in the Last Year:   Transportation Needs:   . Film/video editor (Medical):   Marland Kitchen Lack of Transportation (Non-Medical):   Physical Activity:   . Days of Exercise per Week:   . Minutes of Exercise per Session:   Stress:   . Feeling of Stress :   Social Connections:   . Frequency of Communication with Friends and Family:   . Frequency of Social Gatherings with Friends and Family:   . Attends Religious Services:   . Active Member of Clubs or Organizations:   . Attends Archivist Meetings:   Marland Kitchen Marital Status:   Intimate Partner Violence:   . Fear of Current or Ex-Partner:   . Emotionally Abused:   Marland Kitchen Physically Abused:   . Sexually Abused:    Family History  Problem Relation Age of Onset  . Arthritis Mother   . Asthma Mother   . Diabetes Mother   . Hyperlipidemia Mother   .  Hypertension Mother   . Arthritis Father   . Asthma Father   . Stroke Father   . Hyperlipidemia Father   . Early death Brother 8       Muscular Dystrophy  . Birth defects Brother   . Cancer Maternal Uncle        possibly stomach  . Mental retardation Maternal Uncle   . Cancer Paternal Grandmother 16       breast cancer   . Cancer Cousin 85       paternal cousin's daughter with breast cancer  . Colon cancer Maternal Aunt        dx in her 21s  . Leukemia Paternal Uncle   .  Lymphoma Paternal Uncle   . Down syndrome Paternal Uncle       Review of Systems  All other systems reviewed and are negative.      Objective:   Physical Exam Vitals reviewed.  Constitutional:      General: She is not in acute distress.    Appearance: She is well-developed. She is not diaphoretic.  Cardiovascular:     Rate and Rhythm: Normal rate and regular rhythm.     Heart sounds: Normal heart sounds. No murmur heard.  No friction rub. No gallop.   Pulmonary:     Effort: Pulmonary effort is normal. No respiratory distress.     Breath sounds: Normal breath sounds. No wheezing or rales.  Chest:     Chest wall: No tenderness.  Musculoskeletal:        General: No tenderness or deformity. Normal range of motion.     Cervical back: Normal range of motion and neck supple.     Right foot: Normal. No swelling or deformity.     Left foot: Normal. No swelling, deformity, tenderness or bony tenderness.  Skin:    General: Skin is warm.     Coloration: Skin is not pale.     Findings: No erythema or rash.           Assessment & Plan:  Idiopathic peripheral neuropathy - Plan: Hemoglobin A1c, Vitamin B12, CBC with Differential/Platelet, COMPLETE METABOLIC PANEL WITH GFR, TSH  Controlled type 2 diabetes mellitus without complication, without long-term current use of insulin (HCC) - Plan: Hemoglobin A1c, Vitamin B12, CBC with Differential/Platelet, COMPLETE METABOLIC PANEL WITH GFR, TSH  I suspect the  patient has peripheral neuropathy related to diabetes.  Check hemoglobin A1c to ensure adequate control of her blood sugars.  Check a vitamin B12 to rule out B12 deficiency particularly due to her Metformin use.  Check a TSH to rule out thyroid abnormalities that may contribute to neuropathy.  If lab work is completely normal and diabetes is well controlled, no treatment is necessary at this time.  We could consider gabapentin if necessary for nerve pain however the numbness unfortunately we do not have a good option to treat with at the present time.

## 2020-02-20 LAB — COMPLETE METABOLIC PANEL WITH GFR
AG Ratio: 1.7 (calc) (ref 1.0–2.5)
ALT: 21 U/L (ref 6–29)
AST: 15 U/L (ref 10–35)
Albumin: 4.3 g/dL (ref 3.6–5.1)
Alkaline phosphatase (APISO): 90 U/L (ref 37–153)
BUN: 11 mg/dL (ref 7–25)
CO2: 28 mmol/L (ref 20–32)
Calcium: 9.6 mg/dL (ref 8.6–10.4)
Chloride: 101 mmol/L (ref 98–110)
Creat: 0.66 mg/dL (ref 0.50–0.99)
GFR, Est African American: 108 mL/min/{1.73_m2} (ref >=60)
GFR, Est Non African American: 93 mL/min/{1.73_m2} (ref >=60)
Globulin: 2.5 g/dL (calc) (ref 1.9–3.7)
Glucose, Bld: 157 mg/dL — ABNORMAL HIGH (ref 65–99)
Potassium: 4.1 mmol/L (ref 3.5–5.3)
Sodium: 137 mmol/L (ref 135–146)
Total Bilirubin: 0.4 mg/dL (ref 0.2–1.2)
Total Protein: 6.8 g/dL (ref 6.1–8.1)

## 2020-02-20 LAB — CBC WITH DIFFERENTIAL/PLATELET
Absolute Monocytes: 484 cells/uL (ref 200–950)
Basophils Absolute: 22 cells/uL (ref 0–200)
Basophils Relative: 0.5 %
Eosinophils Absolute: 172 cells/uL (ref 15–500)
Eosinophils Relative: 3.9 %
HCT: 38.6 % (ref 35.0–45.0)
Hemoglobin: 12.7 g/dL (ref 11.7–15.5)
Lymphs Abs: 1593 cells/uL (ref 850–3900)
MCH: 30.5 pg (ref 27.0–33.0)
MCHC: 32.9 g/dL (ref 32.0–36.0)
MCV: 92.6 fL (ref 80.0–100.0)
MPV: 10.5 fL (ref 7.5–12.5)
Monocytes Relative: 11 %
Neutro Abs: 2130 cells/uL (ref 1500–7800)
Neutrophils Relative %: 48.4 %
Platelets: 305 10*3/uL (ref 140–400)
RBC: 4.17 10*6/uL (ref 3.80–5.10)
RDW: 13.1 % (ref 11.0–15.0)
Total Lymphocyte: 36.2 %
WBC: 4.4 10*3/uL (ref 3.8–10.8)

## 2020-02-20 LAB — TSH: TSH: 1.21 mIU/L (ref 0.40–4.50)

## 2020-02-20 LAB — HEMOGLOBIN A1C
Hgb A1c MFr Bld: 6.8 % of total Hgb — ABNORMAL HIGH (ref <5.7)
Mean Plasma Glucose: 148 (calc)
eAG (mmol/L): 8.2 (calc)

## 2020-02-20 LAB — VITAMIN B12: Vitamin B-12: 263 pg/mL (ref 200–1100)

## 2020-03-12 ENCOUNTER — Other Ambulatory Visit: Payer: Self-pay | Admitting: Family Medicine

## 2020-03-12 DIAGNOSIS — K219 Gastro-esophageal reflux disease without esophagitis: Secondary | ICD-10-CM

## 2020-03-20 ENCOUNTER — Other Ambulatory Visit: Payer: Self-pay | Admitting: Family Medicine

## 2020-04-09 ENCOUNTER — Other Ambulatory Visit: Payer: Self-pay | Admitting: Family Medicine

## 2020-04-14 ENCOUNTER — Other Ambulatory Visit: Payer: Self-pay | Admitting: Family Medicine

## 2020-05-14 ENCOUNTER — Telehealth: Payer: Self-pay | Admitting: Hematology

## 2020-05-14 NOTE — Telephone Encounter (Signed)
Rescheduled appts on 10/11 to 10/15. Unable to reach pt. Left voicemail with appt time and date. YF on PAL 10/11.

## 2020-05-15 ENCOUNTER — Other Ambulatory Visit: Payer: Self-pay | Admitting: Family Medicine

## 2020-06-16 ENCOUNTER — Ambulatory Visit: Payer: BC Managed Care – PPO | Admitting: Hematology

## 2020-06-16 ENCOUNTER — Other Ambulatory Visit: Payer: BC Managed Care – PPO

## 2020-06-18 NOTE — Progress Notes (Signed)
[place]   Telephone:([phone]) [phone] Fax:([phone]) [phone]   Clinic Follow up Note   Patient Care Team: [name], MD as PCP - General (Family Medicine) [name], MD (Inactive) as Consulting Physician (General Surgery) [name], MD as Consulting Physician (Hematology) [name], MD as Attending Physician (Radiation Oncology) [name], NP as Nurse Practitioner (Hematology and Oncology)  Date of Service:  06/20/2020  CHIEF COMPLAINT: Follow up for right multifocal breast cancer  SUMMARY OF ONCOLOGIC HISTORY: Oncology History Overview Note  Malignant neoplasm of lower-outer quadrant of right female breast [facility])   Staging form: Breast, AJCC 7th Edition   - Clinical stage from 06/23/2016: Stage IA (T1c, N0, M0) - Unsigned    Malignant neoplasm of lower-outer quadrant of right female breast ([place])  04/28/2015 Imaging   Bone Density Report 04/28/2015 [facility] (L1-L4): T-Score = -1.7. Osteopenia Femoral Neck (Left) : T-Score = -1.0 Normal Femoral Neck (Right): T-Score = 0.4 Normal   03/01/2016 Mammogram   Bilateral screening mammogram was negative   06/08/2016 Imaging   Bilateral breast MRI with and without contrast showed 4 lesions in the right breast, 2 adjacent 1.4 cm mass in the inferior aspect of the right breast, and in the right lateral aspect of the breast, there are 8 mm and a 7 mm irregular enhancing mass. Multiple small enhancing lesions within the liver, indeterminate. No axillary adenopathy.   06/16/2016 Initial Diagnosis   Malignant neoplasm of lower-outer quadrant of right female breast ([place])   06/16/2016 Initial Biopsy   Right breast needle core biopsy at 9 clock, 6:30 clock, 6 clock showed invasive lobular carcinoma, and LCIS   06/16/2016 Receptors her2   two right breast biopsy showed ER strongly positive, PR strongly positive, HER-2 negative, Ki-67 5-10%    06/16/2016 Imaging   Right breast and axilla ultrasound  showed a 1.4 cm mass at the 6:30 o'clock, 1.0 cm mass at the 6:00, and 27m mass at 9:00 position. Axilla was negative.   06/28/2016 Imaging   MRI ABDOMEN W W/O CONTRAST 06/28/2016 IMPRESSION: 1. Multiple hepatic hemangiomas.  No suspicious liver lesion. 2. Hepatic steatosis. 3. No evidence of abdominal metastasis.   06/28/2016 Genetic Testing   Patient has genetic testing done for breast cancer. Results revealed patient has the following variant called, MSH2 [id number].   08/13/2016 Surgery   Bilateral simple mastectomy and right SLN biopsy     08/13/2016 Pathology Results   Right mastectomy showed invasive ductal carcinoma, G2, multifocal, 1.5cm, 1.5cm, and 1.0cm. (+) LCIS, margins are negative, 1 sentinel lymph node was negative. Left simple mastectomy showed LCIS, ADH, no malignancy.    08/13/2016 Oncotype testing   Oncotype recurrence score 11, which predicts 10 year distant recurrence risk of 7% with tamoxifen alone, low risk category. No adjuvant chemo recommended based on this.    09/25/2016 -  Anti-estrogen oral therapy   Adjuvant exemestane 265mdaily    11/05/2016 Surgery   REMOVAL OF BILATERAL TISSUE EXPANDERS WITH PLACEMENT OF BILATERAL BREAST SALINE IMPLANTS by Dr. ThIran Planas     CURRENT THERAPY:  Exemestane (Aromasin) 25 mg daily started on September 25, 2016, Plan for 5-7 years.  INTERVAL HISTORY:  [name] here for a follow up of right breast cancer. She presents to the clinic alone. She is doing fairly well. She notes today she has a headache, but manageable. She also notes her BP is elevated today. She notes weight gain is related to stress related  eating. She has had recent family loss. She is still on Exemestane and mostly tolerating well. She is not sure if her headaches are related to exemestane.  She has had b/l hip replacement in the past. She notes recent right hip pain will lock up and tight. She saw seen by PCP who gave her muscle relaxer. This did  not help. Her stiffness is related to activity level.  She plans to f/u with Dr Iran Planas next month. She notes she is on Vit B12, D and Calcium.   REVIEW OF SYSTEMS:   Constitutional: Denies fevers, chills (+) Weight gain (+) Headache  Eyes: Denies blurriness of vision Ears, nose, mouth, throat, and face: Denies mucositis or sore throat Respiratory: Denies cough, dyspnea or wheezes Cardiovascular: Denies palpitation, chest discomfort or lower extremity swelling Gastrointestinal:  Denies nausea, heartburn or change in bowel habits Skin: Denies abnormal skin rashes MSK: (+) right hip stiffness and tightness  Lymphatics: Denies new lymphadenopathy or easy bruising Neurological:Denies numbness, tingling or new weaknesses Behavioral/Psych: Mood is stable, no new changes  All other systems were reviewed with the patient and are negative.  MEDICAL HISTORY:  Past Medical History:  Diagnosis Date  . Anxiety   . Arthritis    OA hips  . Asthma    allergy and exercise induced asthma   . Depression   . Diabetes mellitus without complication ([place])   . Family history of breast cancer   . Family history of colon cancer   . Family history of stomach cancer   . GERD (gastroesophageal reflux disease)   . Hypertension   . Malignant neoplasm of lower-outer quadrant of right female breast ([place]) 06/21/2016  . Seasonal allergies     SURGICAL HISTORY: Past Surgical History:  Procedure Laterality Date  . BREAST LUMPECTOMY     right   . BREAST RECONSTRUCTION WITH PLACEMENT OF TISSUE EXPANDER AND FLEX HD (ACELLULAR HYDRATED DERMIS) Bilateral 08/13/2016   Procedure: BREAST RECONSTRUCTION WITH PLACEMENT OF TISSUE EXPANDER AND ALLODERM;  Surgeon: Irene Limbo, MD;  Location: [place];  Service: Plastics;  Laterality: Bilateral;  . COLONOSCOPY N/A 10/31/2017   Procedure: COLONOSCOPY;  Surgeon: Danie Binder, MD;  [facility];  Service: Endoscopy;  Laterality: N/A;  8:30  Am  . DILATION AND CURETTAGE OF UTERUS    . JOINT REPLACEMENT    . LIPOSUCTION WITH LIPOFILLING Bilateral 11/05/2016   Procedure: LIPOFILLING FROM ABDOMEN TO BILATERAL CHEST;  Surgeon: Irene Limbo, MD;  Location: [place];  Service: Plastics;  Laterality: Bilateral;  . MASTECTOMY W/ SENTINEL NODE BIOPSY Bilateral 08/13/2016   Procedure: BILATERAL TOTAL MASTECTOMY WITH RIGHT AXILLARY SENTINEL LYMPH NODE BIOPSY;  Surgeon: [name], MD;  Location: [place];  Service: General;  Laterality: Bilateral;  . REMOVAL OF BILATERAL TISSUE EXPANDERS WITH PLACEMENT OF BILATERAL BREAST IMPLANTS Bilateral 11/05/2016   Procedure: REMOVAL OF BILATERAL TISSUE EXPANDERS WITH PLACEMENT OF BILATERAL BREAST SALINE IMPLANTS;  Surgeon: Irene Limbo, MD;  Location: [place];  Service: Plastics;  Laterality: Bilateral;  . TOTAL HIP ARTHROPLASTY Right 01/07/2016   Procedure: TOTAL HIP ARTHROPLASTY ANTERIOR APPROACH;  Surgeon: Gaynelle Arabian, MD;  [facility];  Service: Orthopedics;  Laterality: Right;  . TOTAL HIP ARTHROPLASTY Left 03/22/2018   Procedure: LEFT TOTAL HIP ARTHROPLASTY ANTERIOR APPROACH;  Surgeon: Gaynelle Arabian, MD;  [facility];  Service: Orthopedics;  Laterality: Left;    I have reviewed the social history and family history with the patient  and they are unchanged from previous note.  ALLERGIES:  is allergic to corn-containing products, tomato, azithromycin, biaxin [clarithromycin], and codeine.  MEDICATIONS:  Current Outpatient Medications  Medication Sig Dispense Refill  . benazepril (LOTENSIN) 20 MG tablet TAKE (1) TABLET BY MOUTH TWICE DAILY. 180 tablet 0  . escitalopram (LEXAPRO) 20 MG tablet TAKE ONE TABLET BY MOUTH ONCE DAILY. 90 tablet 3  . exemestane (AROMASIN) 25 MG tablet Take 1 tablet (25 mg total) by mouth daily after breakfast. 90 tablet 3  . fexofenadine (ALLEGRA) 60 MG tablet Take 60 mg by mouth 2 (two) times daily.     Marland Kitchen JANUVIA 100 MG tablet TAKE (1) TABLET BY MOUTH ONCE DAILY. 30 tablet 2  . metFORMIN (GLUCOPHAGE) 1000 MG tablet TAKE 1 TABLET BY MOUTH TWICE DAILY WITH MEALS. 180 tablet 0  . omeprazole (PRILOSEC) 20 MG capsule TAKE (1) CAPSULE BY MOUTH ONCE DAILY. 30 capsule 3   No current facility-administered medications for this visit.    PHYSICAL EXAMINATION: ECOG PERFORMANCE STATUS: 1 - Symptomatic but completely ambulatory  Vitals:   06/20/20 1429  BP: (!) 174/89  Pulse: 80  Resp: 17  Temp: 98.1 F (36.7 C)  SpO2: 99%   Filed Weights   06/20/20 1429  Weight: 174 lb 3.2 oz (79 kg)    GENERAL:alert, no distress and comfortable SKIN: skin color, texture, turgor are normal, no rashes or significant lesions EYES: normal, Conjunctiva are pink and non-injected, sclera clear  NECK: supple, thyroid normal size, non-tender, without nodularity LYMPH:  no palpable lymphadenopathy in the cervical, axillary  LUNGS: clear to auscultation and percussion with normal breathing effort HEART: regular rate & rhythm and no murmurs and no lower extremity edema ABDOMEN:abdomen soft, non-tender and normal bowel sounds Musculoskeletal:no cyanosis of digits and no clubbing  NEURO: alert & oriented x 3 with fluent speech, no focal motor/sensory deficits BREAST: s/p b/l mastectomies and reconstruction with implants in place: surgical incisions healed well.  No palpable mass, nodules or adenopathy bilaterally. Breast exam benign.   LABORATORY DATA:  I have reviewed the data as listed CBC Latest Ref Rng & Units 06/20/2020 02/19/2020 10/17/2019  WBC 4.0 - 10.5 K/uL 5.8 4.4 5.9  Hemoglobin 12.0 - 15.0 g/dL 13.5 12.7 13.2  Hematocrit 36 - 46 % 40.8 38.6 40.0  Platelets 150 - 400 K/uL 321 305 292     CMP Latest Ref Rng & Units 06/20/2020 02/19/2020 10/17/2019  Glucose 70 - 99 mg/dL 87 157(H) 128(H)  BUN 8 - 23 mg/dL 10 11 10   Creatinine 0.44 - 1.00 mg/dL 0.73 0.66 0.77  Sodium 135 - 145 mmol/L 138 137 141    Potassium 3.5 - 5.1 mmol/L 4.1 4.1 4.4  Chloride 98 - 111 mmol/L 102 101 103  CO2 22 - 32 mmol/L 31 28 29   Calcium 8.9 - 10.3 mg/dL 9.9 9.6 9.4  Total Protein 6.5 - 8.1 g/dL 7.7 6.8 7.3  Total Bilirubin 0.3 - 1.2 mg/dL 0.4 0.4 0.3  Alkaline Phos 38 - 126 U/L 108 - 109  AST 15 - 41 U/L 22 15 19   ALT 0 - 44 U/L 30 21 25       RADIOGRAPHIC STUDIES: I have personally reviewed the radiological images as listed and agreed with the findings in the report. No results found.   ASSESSMENT & PLAN:  Carolyn Glass is a 64 y.o. female with    1. Malignant neoplasm of lower-outer quadrant of right female breast, invasive lobular carcinoma and LCIS, mpT1cN0M0, stage  IA, G2, ER+/PR+/HER2-, oncotype low risk -She was diagnosed in 06/2016. She is s/p b/l mastectomy and breast reconstruction surgery. -Oncotype DX score was 11which isat low risk. Therefore I do not recommend chemotherapy.Given she was node negative post-mastectomyradiation was not recommended.  -She has been on anti-estrogen therapy with exemestane since 09/2016. Plan for total 7-10 years due to lobular histology.Tolerating well with manageablejoint pain and hot flashes.  -From a breast cancer standpoint, she is clinically doing well. Lab reviewed, her CBC and CMP are within normal limits. Her physical exam was unremarkable. There is no clinical concern for recurrence. -Continue surveillance. No need for mammogram given s/p b/l mastectomy. -Continue Exemestane.  -f/u in 6 months    2. Cancer Screening, Genetic testing negative for pathogenetic mutations with VUS in MSH2, c.2650A>T. -I have encouraged her to keep up with age appropriate cancer screenings. Her last colonoscopy in 10/2017 showed beginning stages of diverticulitis.  3. DM, type 2 -Continue medications and f/u with PCP. I encouraged her to control her weight with diet change. She has been eating more sweats and junk food due to recent stress.   4. Right hip  stiffness  -She recently has had right hip stiffness exacerbated by activity level, manageable. She is s/p b/l hip replacements.  -Muscle relaxant from PCP did not help.  -I discussed given this is only effecting right hip only, this is unlikely related to Exemestane, but it can contribute.  -Will monitor and f/u with Orthopedics in interim.  5. Osteopenia  -04/28/15 DEXA showed osteopenia with lowest T-score -1.7 at [facility] . Her 05/31/18 DEXA showed lowest T-score at -1.9, slightly worse than previous scan. -We previously discussed thatAImay weak bone -Will continue Vitamin D.She is fine to take Calcium 500-600 units daily as her Calcium level has been normal lately.  -Continue to monitor with DEXA every 2 years. She can repeat in 07/2020 with PCP.   6. Headaches, Elevated BP  -She has occasional headaches recently, including today. She has been stressed with family health issues -BP is elevated today at 174/89 (06/20/20, but has otherwise been normal.  -Given this was not present earlier on exemestane this is likely not the main cause.   7. B12 deficiency -on oral B12 -no anemia   PLAN -DEXA in 07/2020 with GYN  -she wil f/u with orthopedics for right hip stiffness  -Continue Exemestane -Lab and F/u in 6 months -Check Vitamin B12 level at next visit. She is currently on oral B12.    No problem-specific Assessment & Plan notes found for this encounter.   Orders Placed This Encounter  Procedures  . Vitamin B12    Standing Status:   Future    Standing Expiration Date:   06/20/2021   All questions were answered. The patient knows to call the clinic with any problems, questions or concerns. No barriers to learning was detected. The total time spent in the appointment was 30 minutes.     [name], MD 06/20/2020   I, Joslyn Devon, am acting as scribe for [name], MD.   I have reviewed the above documentation for accuracy and completeness, and I agree with the  above.

## 2020-06-20 ENCOUNTER — Inpatient Hospital Stay (HOSPITAL_BASED_OUTPATIENT_CLINIC_OR_DEPARTMENT_OTHER): Payer: Medicare PPO | Admitting: Hematology

## 2020-06-20 ENCOUNTER — Other Ambulatory Visit: Payer: Self-pay

## 2020-06-20 ENCOUNTER — Inpatient Hospital Stay: Payer: Medicare PPO | Attending: Hematology

## 2020-06-20 VITALS — BP 174/89 | HR 80 | Temp 98.1°F | Resp 17 | Ht 63.0 in | Wt 174.2 lb

## 2020-06-20 DIAGNOSIS — Z79899 Other long term (current) drug therapy: Secondary | ICD-10-CM | POA: Insufficient documentation

## 2020-06-20 DIAGNOSIS — M8580 Other specified disorders of bone density and structure, unspecified site: Secondary | ICD-10-CM | POA: Diagnosis not present

## 2020-06-20 DIAGNOSIS — F418 Other specified anxiety disorders: Secondary | ICD-10-CM | POA: Insufficient documentation

## 2020-06-20 DIAGNOSIS — Z170 Estrogen receptor positive status [ER+]: Secondary | ICD-10-CM | POA: Insufficient documentation

## 2020-06-20 DIAGNOSIS — I10 Essential (primary) hypertension: Secondary | ICD-10-CM | POA: Diagnosis not present

## 2020-06-20 DIAGNOSIS — C50511 Malignant neoplasm of lower-outer quadrant of right female breast: Secondary | ICD-10-CM | POA: Diagnosis not present

## 2020-06-20 DIAGNOSIS — M8588 Other specified disorders of bone density and structure, other site: Secondary | ICD-10-CM

## 2020-06-20 DIAGNOSIS — R519 Headache, unspecified: Secondary | ICD-10-CM | POA: Insufficient documentation

## 2020-06-20 DIAGNOSIS — Z9013 Acquired absence of bilateral breasts and nipples: Secondary | ICD-10-CM | POA: Insufficient documentation

## 2020-06-20 DIAGNOSIS — E538 Deficiency of other specified B group vitamins: Secondary | ICD-10-CM | POA: Insufficient documentation

## 2020-06-20 DIAGNOSIS — E119 Type 2 diabetes mellitus without complications: Secondary | ICD-10-CM | POA: Diagnosis not present

## 2020-06-20 DIAGNOSIS — Z7984 Long term (current) use of oral hypoglycemic drugs: Secondary | ICD-10-CM | POA: Insufficient documentation

## 2020-06-20 DIAGNOSIS — Z79811 Long term (current) use of aromatase inhibitors: Secondary | ICD-10-CM | POA: Diagnosis not present

## 2020-06-20 LAB — CBC WITH DIFFERENTIAL/PLATELET
Abs Immature Granulocytes: 0.01 10*3/uL (ref 0.00–0.07)
Basophils Absolute: 0 10*3/uL (ref 0.0–0.1)
Basophils Relative: 0 %
Eosinophils Absolute: 0.1 10*3/uL (ref 0.0–0.5)
Eosinophils Relative: 2 %
HCT: 40.8 % (ref 36.0–46.0)
Hemoglobin: 13.5 g/dL (ref 12.0–15.0)
Immature Granulocytes: 0 %
Lymphocytes Relative: 34 %
Lymphs Abs: 2 10*3/uL (ref 0.7–4.0)
MCH: 31.3 pg (ref 26.0–34.0)
MCHC: 33.1 g/dL (ref 30.0–36.0)
MCV: 94.4 fL (ref 80.0–100.0)
Monocytes Absolute: 0.7 10*3/uL (ref 0.1–1.0)
Monocytes Relative: 12 %
Neutro Abs: 3 10*3/uL (ref 1.7–7.7)
Neutrophils Relative %: 52 %
Platelets: 321 10*3/uL (ref 150–400)
RBC: 4.32 MIL/uL (ref 3.87–5.11)
RDW: 13.1 % (ref 11.5–15.5)
WBC: 5.8 10*3/uL (ref 4.0–10.5)
nRBC: 0 % (ref 0.0–0.2)

## 2020-06-20 LAB — COMPREHENSIVE METABOLIC PANEL
ALT: 30 U/L (ref 0–44)
AST: 22 U/L (ref 15–41)
Albumin: 4.2 g/dL (ref 3.5–5.0)
Alkaline Phosphatase: 108 U/L (ref 38–126)
Anion gap: 5 (ref 5–15)
BUN: 10 mg/dL (ref 8–23)
CO2: 31 mmol/L (ref 22–32)
Calcium: 9.9 mg/dL (ref 8.9–10.3)
Chloride: 102 mmol/L (ref 98–111)
Creatinine, Ser: 0.73 mg/dL (ref 0.44–1.00)
GFR, Estimated: >60 mL/min (ref >60)
Glucose, Bld: 87 mg/dL (ref 70–99)
Potassium: 4.1 mmol/L (ref 3.5–5.1)
Sodium: 138 mmol/L (ref 135–145)
Total Bilirubin: 0.4 mg/dL (ref 0.3–1.2)
Total Protein: 7.7 g/dL (ref 6.5–8.1)

## 2020-06-20 LAB — VITAMIN D 25 HYDROXY (VIT D DEFICIENCY, FRACTURES): Vit D, 25-Hydroxy: 26.06 ng/mL — ABNORMAL LOW (ref 30–100)

## 2020-06-23 ENCOUNTER — Telehealth: Payer: Self-pay | Admitting: Hematology

## 2020-06-23 NOTE — Telephone Encounter (Signed)
No 10/15 los 

## 2020-06-24 ENCOUNTER — Telehealth: Payer: Self-pay

## 2020-06-24 NOTE — Telephone Encounter (Signed)
I left a vm for Carolyn Glass to call me

## 2020-06-24 NOTE — Telephone Encounter (Signed)
-----   Message from [name], MD sent at 06/21/2020  3:15 PM EDT ----- Please let pt know her vit D level is low, and increase her VitdD supplement. If she is not on, take 2000u daily, thanks   [name]  06/21/2020

## 2020-06-25 NOTE — Telephone Encounter (Signed)
MS Ruffolo left me a vm.  I returned her call she did not answer.  I left her a message detailing Dr. Ernestina Penna comments and recommendations.  I encouraged her to call with any questions or concerns.

## 2020-07-10 ENCOUNTER — Other Ambulatory Visit: Payer: Self-pay | Admitting: Family Medicine

## 2020-07-10 DIAGNOSIS — K219 Gastro-esophageal reflux disease without esophagitis: Secondary | ICD-10-CM

## 2020-07-17 ENCOUNTER — Other Ambulatory Visit: Payer: Self-pay | Admitting: Family Medicine

## 2020-08-11 DIAGNOSIS — Z96642 Presence of left artificial hip joint: Secondary | ICD-10-CM | POA: Insufficient documentation

## 2020-08-13 ENCOUNTER — Other Ambulatory Visit: Payer: Self-pay | Admitting: Family Medicine

## 2020-09-08 ENCOUNTER — Ambulatory Visit (INDEPENDENT_AMBULATORY_CARE_PROVIDER_SITE_OTHER): Payer: Medicare PPO | Admitting: Family Medicine

## 2020-09-08 ENCOUNTER — Other Ambulatory Visit: Payer: Self-pay

## 2020-09-08 ENCOUNTER — Encounter: Payer: Self-pay | Admitting: Family Medicine

## 2020-09-08 VITALS — BP 130/68 | HR 90 | Temp 98.0°F | Resp 14 | Ht 63.0 in | Wt 173.0 lb

## 2020-09-08 DIAGNOSIS — J069 Acute upper respiratory infection, unspecified: Secondary | ICD-10-CM | POA: Diagnosis not present

## 2020-09-08 MED ORDER — AMOXICILLIN 875 MG PO TABS: 875.0000 mg | ORAL_TABLET | Freq: Two times a day (BID) | ORAL | 0 refills | Status: DC

## 2020-09-08 MED ORDER — LEVOCETIRIZINE DIHYDROCHLORIDE 5 MG PO TABS: 5.0000 mg | ORAL_TABLET | Freq: Every evening | ORAL | 0 refills | Status: DC

## 2020-09-08 NOTE — Progress Notes (Signed)
Subjective:    Patient ID: Carolyn Glass, female    DOB: 10-03-1955, 65 y.o.   MRN: [id number] Patient states that she has had an upper respiratory infection now for more than a week.  Symptoms include pain in her sinuses, postnasal drip, sore scratchy throat due to postnasal drip, cough that is nonproductive.  She is taken to at home Covid test over the last week that have both been negative.  The most recent one was yesterday.  She denies any chest pain or shortness of breath.  She does have a headache.  She does have fever 99-100.  Past Medical History:  Diagnosis Date  . Anxiety   . Arthritis    OA hips  . Asthma    allergy and exercise induced asthma   . Depression   . Diabetes mellitus without complication ([place])   . Family history of breast cancer   . Family history of colon cancer   . Family history of stomach cancer   . GERD (gastroesophageal reflux disease)   . Hypertension   . Malignant neoplasm of lower-outer quadrant of right female breast ([place]) 06/21/2016  . Seasonal allergies    Past Surgical History:  Procedure Laterality Date  . BREAST LUMPECTOMY     right   . BREAST RECONSTRUCTION WITH PLACEMENT OF TISSUE EXPANDER AND FLEX HD (ACELLULAR HYDRATED DERMIS) Bilateral 08/13/2016   Procedure: BREAST RECONSTRUCTION WITH PLACEMENT OF TISSUE EXPANDER AND ALLODERM;  Surgeon: Irene Limbo, MD;  Location: [place];  Service: Plastics;  Laterality: Bilateral;  . COLONOSCOPY N/A 10/31/2017   Procedure: COLONOSCOPY;  Surgeon: Danie Binder, MD;  [facility];  Service: Endoscopy;  Laterality: N/A;  8:30 Am  . DILATION AND CURETTAGE OF UTERUS    . JOINT REPLACEMENT    . LIPOSUCTION WITH LIPOFILLING Bilateral 11/05/2016   Procedure: LIPOFILLING FROM ABDOMEN TO BILATERAL CHEST;  Surgeon: Irene Limbo, MD;  Location: [place];  Service: Plastics;  Laterality: Bilateral;  . MASTECTOMY W/ SENTINEL NODE BIOPSY Bilateral 08/13/2016    Procedure: BILATERAL TOTAL MASTECTOMY WITH RIGHT AXILLARY SENTINEL LYMPH NODE BIOPSY;  Surgeon: [name], MD;  Location: [place];  Service: General;  Laterality: Bilateral;  . REMOVAL OF BILATERAL TISSUE EXPANDERS WITH PLACEMENT OF BILATERAL BREAST IMPLANTS Bilateral 11/05/2016   Procedure: REMOVAL OF BILATERAL TISSUE EXPANDERS WITH PLACEMENT OF BILATERAL BREAST SALINE IMPLANTS;  Surgeon: Irene Limbo, MD;  Location: [place];  Service: Plastics;  Laterality: Bilateral;  . TOTAL HIP ARTHROPLASTY Right 01/07/2016   Procedure: TOTAL HIP ARTHROPLASTY ANTERIOR APPROACH;  Surgeon: Gaynelle Arabian, MD;  [facility];  Service: Orthopedics;  Laterality: Right;  . TOTAL HIP ARTHROPLASTY Left 03/22/2018   Procedure: LEFT TOTAL HIP ARTHROPLASTY ANTERIOR APPROACH;  Surgeon: Gaynelle Arabian, MD;  [facility];  Service: Orthopedics;  Laterality: Left;   Current Outpatient Medications on File Prior to Visit  Medication Sig Dispense Refill  . benazepril (LOTENSIN) 20 MG tablet TAKE (1) TABLET BY MOUTH TWICE DAILY. 180 tablet 0  . escitalopram (LEXAPRO) 20 MG tablet TAKE ONE TABLET BY MOUTH ONCE DAILY. 90 tablet 3  . exemestane (AROMASIN) 25 MG tablet Take 1 tablet (25 mg total) by mouth daily after breakfast. 90 tablet 3  . fexofenadine (ALLEGRA) 60 MG tablet Take 60 mg by mouth 2 (two) times daily.    Marland Kitchen JANUVIA 100 MG tablet TAKE (1) TABLET BY MOUTH ONCE DAILY. 30 tablet 0  . metFORMIN (GLUCOPHAGE) 1000  MG tablet TAKE 1 TABLET BY MOUTH TWICE DAILY WITH MEALS. 180 tablet 0  . omeprazole (PRILOSEC) 20 MG capsule TAKE (1) CAPSULE BY MOUTH ONCE DAILY. 90 capsule 0   No current facility-administered medications on file prior to visit.   Allergies  Allergen Reactions  . Corn-Containing Products Itching    Running nose  . Tomato Itching    Running nose   . Azithromycin Itching and Rash  . Biaxin [Clarithromycin] Rash  . Codeine Rash    Thinks a codeine cough  syrup caused rash.   Social History   Socioeconomic History  . Marital status: Married    Spouse name: Not on file  . Number of children: 1  . Years of education: Not on file  . Highest education level: Not on file  Occupational History  . Not on file  Tobacco Use  . Smoking status: Never Smoker  . Smokeless tobacco: Never Used  Vaping Use  . Vaping Use: Never used  Substance and Sexual Activity  . Alcohol use: No  . Drug use: No  . Sexual activity: Yes  Other Topics Concern  . Not on file  Social History Narrative  . Not on file   Social Determinants of Health   Financial Resource Strain: Not on file  Food Insecurity: Not on file  Transportation Needs: Not on file  Physical Activity: Not on file  Stress: Not on file  Social Connections: Not on file  Intimate Partner Violence: Not on file   Family History  Problem Relation Age of Onset  . Arthritis Mother   . Asthma Mother   . Diabetes Mother   . Hyperlipidemia Mother   . Hypertension Mother   . Arthritis Father   . Asthma Father   . Stroke Father   . Hyperlipidemia Father   . Early death Brother 8       Muscular Dystrophy  . Birth defects Brother   . Cancer Maternal Uncle        possibly stomach  . Mental retardation Maternal Uncle   . Cancer Paternal Grandmother 51       breast cancer   . Cancer Cousin 35       paternal cousin's daughter with breast cancer  . Colon cancer Maternal Aunt        dx in her 38s  . Leukemia Paternal Uncle   . Lymphoma Paternal Uncle   . Down syndrome Paternal Uncle       Review of Systems  All other systems reviewed and are negative.      Objective:   Physical Exam Vitals reviewed.  Constitutional:      General: She is not in acute distress.    Appearance: Normal appearance. She is well-developed. She is not ill-appearing, toxic-appearing or diaphoretic.  HENT:     Right Ear: Tympanic membrane and ear canal normal.     Left Ear: Tympanic membrane and ear  canal normal.     Nose: Congestion and rhinorrhea present.     Right Sinus: Frontal sinus tenderness present.     Left Sinus: Frontal sinus tenderness present.     Mouth/Throat:     Pharynx: Posterior oropharyngeal erythema present. No oropharyngeal exudate.  Cardiovascular:     Rate and Rhythm: Normal rate and regular rhythm.     Heart sounds: Normal heart sounds. No murmur heard. No friction rub. No gallop.   Pulmonary:     Effort: Pulmonary effort is normal. No respiratory distress.  Breath sounds: Normal breath sounds. No wheezing or rales.  Chest:     Chest wall: No tenderness.  Musculoskeletal:        General: No tenderness or deformity. Normal range of motion.     Cervical back: Normal range of motion and neck supple.     Right foot: Normal. No swelling or deformity.     Left foot: Normal. No swelling, deformity, tenderness or bony tenderness.  Skin:    General: Skin is warm.     Coloration: Skin is not pale.     Findings: No erythema or rash.  Neurological:     Mental Status: She is alert.           Assessment & Plan:  Upper respiratory tract infection, unspecified type - Plan: SARS-COV-2 RNA,(COVID-19) QUAL NAAT  I will screen the patient for Covid.  I explained to her that I believe that she most likely has a viral upper respiratory infection although I cannot exclude an early bacterial sinus infection.  I recommended tincture of time however if she develops worsening fevers, worsening sinus pain, or purulent nasal drainage I would take amoxicillin 875 mg twice daily for 10 days for a secondary sinus infection.  Meanwhile treat the drainage with Xyzal 5 mg daily and use Mucinex for cough and chest congestion.  Treat the fever dramatically with Tylenol and/or ibuprofen

## 2020-09-10 LAB — SARS-COV-2 RNA,(COVID-19) QUALITATIVE NAAT: SARS CoV2 RNA: NOT DETECTED

## 2020-09-11 ENCOUNTER — Other Ambulatory Visit: Payer: Self-pay | Admitting: Family Medicine

## 2020-10-08 ENCOUNTER — Other Ambulatory Visit: Payer: Self-pay | Admitting: Family Medicine

## 2020-10-08 DIAGNOSIS — K219 Gastro-esophageal reflux disease without esophagitis: Secondary | ICD-10-CM

## 2020-10-15 ENCOUNTER — Other Ambulatory Visit: Payer: Self-pay | Admitting: Family Medicine

## 2020-11-12 ENCOUNTER — Other Ambulatory Visit: Payer: Self-pay | Admitting: Family Medicine

## 2020-11-12 ENCOUNTER — Other Ambulatory Visit: Payer: Self-pay | Admitting: Hematology

## 2020-11-12 DIAGNOSIS — C50511 Malignant neoplasm of lower-outer quadrant of right female breast: Secondary | ICD-10-CM

## 2020-11-12 DIAGNOSIS — Z170 Estrogen receptor positive status [ER+]: Secondary | ICD-10-CM

## 2020-11-14 ENCOUNTER — Telehealth: Payer: Self-pay | Admitting: Hematology

## 2020-11-14 NOTE — Telephone Encounter (Signed)
Scheduled per 03/10 scheduled message, patient has been called and voicemail was left regarding upcoming April appointment. Calender will be mailed.

## 2020-12-16 ENCOUNTER — Telehealth: Payer: Self-pay | Admitting: Hematology

## 2020-12-16 NOTE — Telephone Encounter (Signed)
R/s appt per 4/12 sch msg. Pt aware.

## 2020-12-17 ENCOUNTER — Other Ambulatory Visit: Payer: Self-pay

## 2020-12-17 ENCOUNTER — Inpatient Hospital Stay (HOSPITAL_BASED_OUTPATIENT_CLINIC_OR_DEPARTMENT_OTHER): Payer: Medicare PPO | Admitting: Hematology

## 2020-12-17 ENCOUNTER — Inpatient Hospital Stay: Payer: Medicare PPO | Admitting: Nurse Practitioner

## 2020-12-17 ENCOUNTER — Inpatient Hospital Stay: Payer: Medicare PPO | Attending: Nurse Practitioner

## 2020-12-17 ENCOUNTER — Encounter: Payer: Self-pay | Admitting: Hematology

## 2020-12-17 VITALS — BP 146/79 | HR 76 | Temp 98.3°F | Resp 17 | Ht 63.0 in | Wt 170.7 lb

## 2020-12-17 DIAGNOSIS — E538 Deficiency of other specified B group vitamins: Secondary | ICD-10-CM | POA: Diagnosis not present

## 2020-12-17 DIAGNOSIS — Z79811 Long term (current) use of aromatase inhibitors: Secondary | ICD-10-CM | POA: Diagnosis not present

## 2020-12-17 DIAGNOSIS — C50511 Malignant neoplasm of lower-outer quadrant of right female breast: Secondary | ICD-10-CM

## 2020-12-17 DIAGNOSIS — M8580 Other specified disorders of bone density and structure, unspecified site: Secondary | ICD-10-CM | POA: Insufficient documentation

## 2020-12-17 DIAGNOSIS — Z170 Estrogen receptor positive status [ER+]: Secondary | ICD-10-CM | POA: Diagnosis not present

## 2020-12-17 DIAGNOSIS — M8588 Other specified disorders of bone density and structure, other site: Secondary | ICD-10-CM

## 2020-12-17 LAB — CBC WITH DIFFERENTIAL/PLATELET
Abs Immature Granulocytes: 0.01 10*3/uL (ref 0.00–0.07)
Basophils Absolute: 0 10*3/uL (ref 0.0–0.1)
Basophils Relative: 1 %
Eosinophils Absolute: 0.4 10*3/uL (ref 0.0–0.5)
Eosinophils Relative: 6 %
HCT: 40.4 % (ref 36.0–46.0)
Hemoglobin: 13.4 g/dL (ref 12.0–15.0)
Immature Granulocytes: 0 %
Lymphocytes Relative: 21 %
Lymphs Abs: 1.4 10*3/uL (ref 0.7–4.0)
MCH: 31 pg (ref 26.0–34.0)
MCHC: 33.2 g/dL (ref 30.0–36.0)
MCV: 93.5 fL (ref 80.0–100.0)
Monocytes Absolute: 0.4 10*3/uL (ref 0.1–1.0)
Monocytes Relative: 7 %
Neutro Abs: 4.1 10*3/uL (ref 1.7–7.7)
Neutrophils Relative %: 65 %
Platelets: 284 10*3/uL (ref 150–400)
RBC: 4.32 MIL/uL (ref 3.87–5.11)
RDW: 13.2 % (ref 11.5–15.5)
WBC: 6.4 10*3/uL (ref 4.0–10.5)
nRBC: 0 % (ref 0.0–0.2)

## 2020-12-17 LAB — COMPREHENSIVE METABOLIC PANEL
ALT: 28 U/L (ref 0–44)
AST: 20 U/L (ref 15–41)
Albumin: 4 g/dL (ref 3.5–5.0)
Alkaline Phosphatase: 97 U/L (ref 38–126)
Anion gap: 13 (ref 5–15)
BUN: 11 mg/dL (ref 8–23)
CO2: 26 mmol/L (ref 22–32)
Calcium: 9.6 mg/dL (ref 8.9–10.3)
Chloride: 102 mmol/L (ref 98–111)
Creatinine, Ser: 0.78 mg/dL (ref 0.44–1.00)
GFR, Estimated: >60 mL/min (ref >60)
Glucose, Bld: 145 mg/dL — ABNORMAL HIGH (ref 70–99)
Potassium: 4.1 mmol/L (ref 3.5–5.1)
Sodium: 141 mmol/L (ref 135–145)
Total Bilirubin: 0.4 mg/dL (ref 0.3–1.2)
Total Protein: 7.1 g/dL (ref 6.5–8.1)

## 2020-12-17 LAB — VITAMIN B12: Vitamin B-12: 490 pg/mL (ref 180–914)

## 2020-12-17 LAB — VITAMIN D 25 HYDROXY (VIT D DEFICIENCY, FRACTURES): Vit D, 25-Hydroxy: 35.97 ng/mL (ref 30–100)

## 2020-12-17 MED ORDER — TAMOXIFEN CITRATE 20 MG PO TABS: 20.0000 mg | ORAL_TABLET | Freq: Every day | ORAL | 3 refills | Status: DC

## 2020-12-17 NOTE — Progress Notes (Signed)
[place]   Telephone:([phone] Fax:(336) [phone]   Clinic Follow up Note   Patient Care Team: [name], MD as PCP - General (Family Medicine) [name], MD (Inactive) as Consulting Physician (General Surgery) [name], MD as Consulting Physician (Hematology) [name], MD as Attending Physician (Radiation Oncology) [name], NP as Nurse Practitioner (Hematology and Oncology)  Date of Service:  12/17/2020  CHIEF COMPLAINT: Follow up for right multifocal breast cancer  SUMMARY OF ONCOLOGIC HISTORY: Oncology History Overview Note  Malignant neoplasm of lower-outer quadrant of right female breast [facility])   Staging form: Breast, AJCC 7th Edition   - Clinical stage from 06/23/2016: Stage IA (T1c, N0, M0) - Unsigned    Malignant neoplasm of lower-outer quadrant of right female breast ([place])  04/28/2015 Imaging   Bone Density Report 04/28/2015 [facility] Spine (L1-L4): T-Score = -1.7. Osteopenia Femoral Neck (Left) : T-Score = -1.0 Normal Femoral Neck (Right): T-Score = 0.4 Normal   03/01/2016 Mammogram   Bilateral screening mammogram was negative   06/08/2016 Imaging   Bilateral breast MRI with and without contrast showed 4 lesions in the right breast, 2 adjacent 1.4 cm mass in the inferior aspect of the right breast, and in the right lateral aspect of the breast, there are 8 mm and a 7 mm irregular enhancing mass. Multiple small enhancing lesions within the liver, indeterminate. No axillary adenopathy.   06/16/2016 Initial Diagnosis   Malignant neoplasm of lower-outer quadrant of right female breast ([place])   06/16/2016 Initial Biopsy   Right breast needle core biopsy at 9 clock, 6:30 clock, 6 clock showed invasive lobular carcinoma, and LCIS   06/16/2016 Receptors her2   two right breast biopsy showed ER strongly positive, PR strongly positive, HER-2 negative, Ki-67 5-10%    06/16/2016 Imaging   Right breast and axilla ultrasound  showed a 1.4 cm mass at the 6:30 o'clock, 1.0 cm mass at the 6:00, and 65mm mass at 9:00 position. Axilla was negative.   06/28/2016 Imaging   MRI ABDOMEN W W/O CONTRAST 06/28/2016 IMPRESSION: 1. Multiple hepatic hemangiomas.  No suspicious liver lesion. 2. Hepatic steatosis. 3. No evidence of abdominal metastasis.   06/28/2016 Genetic Testing   Patient has genetic testing done for breast cancer. Results revealed patient has the following variant called, MSH2 [id number].   08/13/2016 Surgery   Bilateral simple mastectomy and right SLN biopsy     08/13/2016 Pathology Results   Right mastectomy showed invasive ductal carcinoma, G2, multifocal, 1.5cm, 1.5cm, and 1.0cm. (+) LCIS, margins are negative, 1 sentinel lymph node was negative. Left simple mastectomy showed LCIS, ADH, no malignancy.    08/13/2016 Oncotype testing   Oncotype recurrence score 11, which predicts 10 year distant recurrence risk of 7% with tamoxifen alone, low risk category. No adjuvant chemo recommended based on this.    09/25/2016 -  Anti-estrogen oral therapy   Adjuvant exemestane 25mg  daily    11/05/2016 Surgery   REMOVAL OF BILATERAL TISSUE EXPANDERS WITH PLACEMENT OF BILATERAL BREAST SALINE IMPLANTS by Dr. Iran Planas       CURRENT THERAPY:  Exemestane (Aromasin) 25 mg daily started on September 25, 2016, Plan for 5-7 years.  INTERVAL HISTORY:  Carolyn Glass is here for a follow up of right breast cancer. She was last seen by me 6 months ago. She presents to the clinic alone. She notes she is doing well. She denies any new changes in the past 6 months. She notes she is tolerating Exemestane  well. She notes pain in her left hand with puffiness and stiffness. She notes her joint pain has slowly worsened over time but she also attributes this to her age.  S/p b/l hip replacement, she has hip stiffness, but manageable.    REVIEW OF SYSTEMS:   Constitutional: Denies fevers, chills or abnormal weight loss Eyes:  Denies blurriness of vision Ears, nose, mouth, throat, and face: Denies mucositis or sore throat Respiratory: Denies cough, dyspnea or wheezes Cardiovascular: Denies palpitation, chest discomfort or lower extremity swelling Gastrointestinal:  Denies nausea, heartburn or change in bowel habits Skin: Denies abnormal skin rashes MSK: (+) Left hand pain and swelling Lymphatics: Denies new lymphadenopathy or easy bruising Neurological:Denies numbness, tingling or new weaknesses Behavioral/Psych: Mood is stable, no new changes  All other systems were reviewed with the patient and are negative.  MEDICAL HISTORY:  Past Medical History:  Diagnosis Date  . Anxiety   . Arthritis    OA hips  . Asthma    allergy and exercise induced asthma   . Depression   . Diabetes mellitus without complication ([place])   . Family history of breast cancer   . Family history of colon cancer   . Family history of stomach cancer   . GERD (gastroesophageal reflux disease)   . Hypertension   . Malignant neoplasm of lower-outer quadrant of right female breast ([place]) 06/21/2016  . Seasonal allergies     SURGICAL HISTORY: Past Surgical History:  Procedure Laterality Date  . BREAST LUMPECTOMY     right   . BREAST RECONSTRUCTION WITH PLACEMENT OF TISSUE EXPANDER AND FLEX HD (ACELLULAR HYDRATED DERMIS) Bilateral 08/13/2016   Procedure: BREAST RECONSTRUCTION WITH PLACEMENT OF TISSUE EXPANDER AND ALLODERM;  Surgeon: Irene Limbo, MD;  Location: [place];  Service: Plastics;  Laterality: Bilateral;  . COLONOSCOPY N/A 10/31/2017   Procedure: COLONOSCOPY;  Surgeon: Danie Binder, MD;  [facility];  Service: Endoscopy;  Laterality: N/A;  8:30 Am  . DILATION AND CURETTAGE OF UTERUS    . JOINT REPLACEMENT    . LIPOSUCTION WITH LIPOFILLING Bilateral 11/05/2016   Procedure: LIPOFILLING FROM ABDOMEN TO BILATERAL CHEST;  Surgeon: Irene Limbo, MD;  Location: [place];   Service: Plastics;  Laterality: Bilateral;  . MASTECTOMY W/ SENTINEL NODE BIOPSY Bilateral 08/13/2016   Procedure: BILATERAL TOTAL MASTECTOMY WITH RIGHT AXILLARY SENTINEL LYMPH NODE BIOPSY;  Surgeon: [name], MD;  Location: [place];  Service: General;  Laterality: Bilateral;  . REMOVAL OF BILATERAL TISSUE EXPANDERS WITH PLACEMENT OF BILATERAL BREAST IMPLANTS Bilateral 11/05/2016   Procedure: REMOVAL OF BILATERAL TISSUE EXPANDERS WITH PLACEMENT OF BILATERAL BREAST SALINE IMPLANTS;  Surgeon: Irene Limbo, MD;  Location: [place];  Service: Plastics;  Laterality: Bilateral;  . TOTAL HIP ARTHROPLASTY Right 01/07/2016   Procedure: TOTAL HIP ARTHROPLASTY ANTERIOR APPROACH;  Surgeon: Gaynelle Arabian, MD;  Location: [facility];  Service: Orthopedics;  Laterality: Right;  . TOTAL HIP ARTHROPLASTY Left 03/22/2018   Procedure: LEFT TOTAL HIP ARTHROPLASTY ANTERIOR APPROACH;  Surgeon: Gaynelle Arabian, MD;  Location: [facility];  Service: Orthopedics;  Laterality: Left;    I have reviewed the social history and family history with the patient and they are unchanged from previous note.  ALLERGIES:  is allergic to corn-containing products, tomato, azithromycin, biaxin [clarithromycin], and codeine.  MEDICATIONS:  Current Outpatient Medications  Medication Sig Dispense Refill  . tamoxifen (NOLVADEX) 20 MG tablet Take 1 tablet (20 mg total) by mouth daily. 30 tablet  3  . amoxicillin (AMOXIL) 875 MG tablet Take 1 tablet (875 mg total) by mouth 2 (two) times daily. 20 tablet 0  . benazepril (LOTENSIN) 20 MG tablet TAKE (1) TABLET BY MOUTH TWICE DAILY. 180 tablet 0  . escitalopram (LEXAPRO) 20 MG tablet TAKE ONE TABLET BY MOUTH ONCE DAILY. 90 tablet 3  . exemestane (AROMASIN) 25 MG tablet TAKE 1 TABLET ONCE DAILY AFTER BREAKFAST. 30 tablet 0  . fexofenadine (ALLEGRA) 60 MG tablet Take 60 mg by mouth 2 (two) times daily.    Marland Kitchen JANUVIA 100 MG tablet TAKE (1) TABLET BY MOUTH ONCE  DAILY. 90 tablet 3  . levocetirizine (XYZAL) 5 MG tablet Take 1 tablet (5 mg total) by mouth every evening. For drainage 30 tablet 0  . metFORMIN (GLUCOPHAGE) 1000 MG tablet TAKE 1 TABLET BY MOUTH TWICE DAILY WITH MEALS. 180 tablet 0  . omeprazole (PRILOSEC) 20 MG capsule TAKE (1) CAPSULE BY MOUTH ONCE DAILY. 90 capsule 0   No current facility-administered medications for this visit.    PHYSICAL EXAMINATION: ECOG PERFORMANCE STATUS: 1 - Symptomatic but completely ambulatory  Vitals:   12/17/20 1050  BP: (!) 146/79  Pulse: 76  Resp: 17  Temp: 98.3 F (36.8 C)  SpO2: 96%   Filed Weights   12/17/20 1050  Weight: 170 lb 11.2 oz (77.4 kg)    GENERAL:alert, no distress and comfortable SKIN: skin color, texture, turgor are normal, no rashes or significant lesions EYES: normal, Conjunctiva are pink and non-injected, sclera clear  NECK: supple, thyroid normal size, non-tender, without nodularity LYMPH:  no palpable lymphadenopathy in the cervical, axillary  LUNGS: clear to auscultation and percussion with normal breathing effort HEART: regular rate & rhythm and no murmurs and no lower extremity edema ABDOMEN:abdomen soft, non-tender and normal bowel sounds Musculoskeletal:no cyanosis of digits and no clubbing  NEURO: alert & oriented x 3 with fluent speech, no focal motor/sensory deficits BREAST: s/p b/l mastectomy and reconstruction with implants: Surgical incision healed well. No palpable mass, nodules or adenopathy bilaterally. Breast exam benign.   LABORATORY DATA:  I have reviewed the data as listed CBC Latest Ref Rng & Units 12/17/2020 06/20/2020 02/19/2020  WBC 4.0 - 10.5 K/uL 6.4 5.8 4.4  Hemoglobin 12.0 - 15.0 g/dL 13.4 13.5 12.7  Hematocrit 36.0 - 46.0 % 40.4 40.8 38.6  Platelets 150 - 400 K/uL 284 321 305     CMP Latest Ref Rng & Units 12/17/2020 06/20/2020 02/19/2020  Glucose 70 - 99 mg/dL 145(H) 87 157(H)  BUN 8 - 23 mg/dL _0 Creatinine 0.44 - 1.00 mg/dL 0.78  0.73 0.66  Sodium 135 - 145 mmol/L 141 138 137  Potassium 3.5 - 5.1 mmol/L 4.1 4.1 4.1  Chloride 98 - 111 mmol/L 102 102 101  CO2 22 - 32 mmol/L _1 Calcium 8.9 - 10.3 mg/dL 9.6 9.9 9.6  Total Protein 6.5 - 8.1 g/dL 7.1 7.7 6.8  Total Bilirubin 0.3 - 1.2 mg/dL 0.4 0.4 0.4  Alkaline Phos 38 - 126 U/L 97 108 -  AST 15 - 41 U/L _2 ALT 0 - 44 U/L _3 RADIOGRAPHIC STUDIES: I have personally reviewed the radiological images as listed and agreed with the findings in the report. No results found.   ASSESSMENT & PLAN:  Carolyn Glass is a 65 y.o. female with    1. Malignant neoplasm of lower-outer quadrant of right female breast, invasive lobular  carcinoma and LCIS, mpT1cN0M0, stage IA, G2, ER+/PR+/HER2-, oncotype low risk -She was diagnosed in 06/2016. She is s/p b/l mastectomy and breast reconstruction surgery. -Oncotype DX score was 11which isat low risk. Therefore I do not recommend chemotherapy.Given she was node negative post-mastectomyradiation was not recommended.  -She has been on anti-estrogen therapy with exemestane since 09/2016. Plan for total 7-10 years due to lobular histology. -Given her left hand joint pain, that has progressed over time, I discussed option of changing exemestane to Tamoxifen. I reviewed side effects in great detail with patient including risk of endometrial cancer, blood clots, etc.  -She opted to hold Exemestane for 1-2 months. She may start Tamoxifen in mid 01/2021 if joint pain improved or start in 02/2021 if pain did not improve.  -She is otherwise clinically doing well. Lab reviewed, her CBC and CMP are within normal limits. Her physical exam was unremarkable. There is no clinical concern for recurrence. -Continue surveillance. No need for mammogram given s/p b/l mastectomy. -Phone visit in 3 months and f/u in 6 months   2. Cancer Screening, Genetic testing negative for pathogenetic mutations with VUS in MSH2, c.2650A>T. -I  have encouraged her to keep up with age appropriate cancer screenings. Her last colonoscopy in 10/2017 showed beginning stages of diverticulitis.  3. DM, type 2 -Continue medications and f/u with PCP.  4. Right hip stiffness, Arthritis  -She is s/p b/l hip replacements. She occasionally has right hip stiffness -Will monitor and f/u with Orthopedics in interim. -She notes left hand joint pain, stiffness and puffiness. This could be arthritis and partly from Exemestane. Patient notes this has slowly increased over the years.  -I discussed the option of changing exemestane to Tamoxifen. She opted to hold Exemestane for 1-2 months. If pain improved, will start her on Tamoxifen.  -I also recommend she increase physical activity.    5. Osteopenia  -04/28/15 DEXA showed osteopenia with lowest T-score -1.7 at [facility]. Her 05/31/18 DEXA showed lowest T-score at -1.9, slightly worse than previous scan. -Will continue Vitamin D.She is fine to take Calcium 500-600 units daily as her Calcium level has been normal lately. -Continue to monitor with DEXA every 2 years. She can repeat in 07/2020 with PCP.   6. B12 deficiency -On oral B12. No anemia   PLAN -Hold exemestane for 1-2 months  -I called in Tamoxifen, may start in mid 01/2021 if joint pain improved or start in 02/2021 if pain did not improve.  -Phone visit in 3 months  -Lab and f/u in 6 months    No problem-specific Assessment & Plan notes found for this encounter.   No orders of the defined types were placed in this encounter.  All questions were answered. The patient knows to call the clinic with any problems, questions or concerns. No barriers to learning was detected. The total time spent in the appointment was 30 minutes.     [name], MD 12/17/2020   I, Joslyn Devon, am acting as scribe for [name], MD.   I have reviewed the above documentation for accuracy and completeness, and I agree with the above.

## 2020-12-18 ENCOUNTER — Other Ambulatory Visit: Payer: Self-pay | Admitting: Family Medicine

## 2021-01-05 ENCOUNTER — Telehealth: Payer: Self-pay

## 2021-01-05 NOTE — Telephone Encounter (Signed)
-----   Message from [name], MD sent at 01/04/2021  6:22 PM EDT ----- Please let pt know that her VitD level, B12 and CMP from last visit were all normal, good news!  [name]  01/04/2021

## 2021-01-05 NOTE — Telephone Encounter (Signed)
Called spoke with pt concerning most recent labs made her aware everything was WNL no new orders no concerns encourage to call for any questions concerns or changes

## 2021-01-08 ENCOUNTER — Other Ambulatory Visit: Payer: Self-pay | Admitting: Family Medicine

## 2021-01-08 DIAGNOSIS — K219 Gastro-esophageal reflux disease without esophagitis: Secondary | ICD-10-CM

## 2021-02-17 ENCOUNTER — Other Ambulatory Visit: Payer: Self-pay | Admitting: Family Medicine

## 2021-03-19 ENCOUNTER — Encounter: Payer: Self-pay | Admitting: Hematology

## 2021-03-19 ENCOUNTER — Ambulatory Visit (HOSPITAL_BASED_OUTPATIENT_CLINIC_OR_DEPARTMENT_OTHER): Payer: Medicare PPO | Admitting: Hematology

## 2021-03-19 DIAGNOSIS — C50511 Malignant neoplasm of lower-outer quadrant of right female breast: Secondary | ICD-10-CM | POA: Diagnosis not present

## 2021-03-19 DIAGNOSIS — Z170 Estrogen receptor positive status [ER+]: Secondary | ICD-10-CM

## 2021-03-19 MED ORDER — TAMOXIFEN CITRATE 20 MG PO TABS: 20.0000 mg | ORAL_TABLET | Freq: Every day | ORAL | 3 refills | Status: DC

## 2021-03-19 NOTE — Progress Notes (Signed)
[place]   [phone] Fax:([phone]) [phone]   Clinic Follow up Note   Patient Care Team: [name], MD as PCP - General (Family Medicine) [name], MD (Inactive) as Consulting Physician (General Surgery) [name], MD as Consulting Physician (Hematology) [name], MD as Attending Physician (Radiation Oncology) [name], NP as Nurse Practitioner (Hematology and Oncology)  Date of Service:  03/19/2021  I connected with [name] on 03/19/2021 at  8:20 AM EDT by telephone visit and verified that I am speaking with the correct person using two identifiers.  I discussed the limitations, risks, security and privacy concerns of performing an evaluation and management service by telephone and the availability of in person appointments. I also discussed with the patient that there may be a patient responsible charge related to this service. The patient expressed understanding and agreed to proceed.   Other persons participating in the visit and their role in the encounter:  none  Patient's location:  home Provider's location:  my office  CHIEF COMPLAINT: f/u of right multifocal breast cancer  SUMMARY OF ONCOLOGIC HISTORY: Oncology History Overview Note  Malignant neoplasm of lower-outer quadrant of right female breast [facility])   Staging form: Breast, AJCC 7th Edition   - Clinical stage from 06/23/2016: Stage IA (T1c, N0, M0) - Unsigned     Malignant neoplasm of lower-outer quadrant of right female breast ([place])  04/28/2015 Imaging   Bone Density Report 04/28/2015 [facility] (L1-L4): T-Score = -1.7. Osteopenia Femoral Neck (Left) : T-Score = -1.0 Normal Femoral Neck (Right): T-Score = 0.4 Normal   03/01/2016 Mammogram   Bilateral screening mammogram was negative    06/08/2016 Imaging   Bilateral breast MRI with and without contrast showed 4 lesions in the right breast, 2 adjacent 1.4 cm mass in the inferior aspect of the  right breast, and in the right lateral aspect of the breast, there are 8 mm and a 7 mm irregular enhancing mass. Multiple small enhancing lesions within the liver, indeterminate. No axillary adenopathy.   06/16/2016 Initial Diagnosis   Malignant neoplasm of lower-outer quadrant of right female breast ([place])    06/16/2016 Initial Biopsy   Right breast needle core biopsy at 9 clock, 6:30 clock, 6 clock showed invasive lobular carcinoma, and LCIS    06/16/2016 Receptors her2   two right breast biopsy showed ER strongly positive, PR strongly positive, HER-2 negative, Ki-67 5-10%     06/16/2016 Imaging   Right breast and axilla ultrasound showed a 1.4 cm mass at the 6:30 o'clock, 1.0 cm mass at the 6:00, and 25m mass at 9:00 position. Axilla was negative.    06/28/2016 Imaging   MRI ABDOMEN W W/O CONTRAST 06/28/2016 IMPRESSION: 1. Multiple hepatic hemangiomas.  No suspicious liver lesion. 2. Hepatic steatosis. 3. No evidence of abdominal metastasis.    06/28/2016 Genetic Testing   Patient has genetic testing done for breast cancer. Results revealed patient has the following variant called, MSH2 c.2650A>T.    08/13/2016 Surgery   Bilateral simple mastectomy and right SLN biopsy      08/13/2016 Pathology Results   Right mastectomy showed invasive ductal carcinoma, G2, multifocal, 1.5cm, 1.5cm, and 1.0cm. (+) LCIS, margins are negative, 1 sentinel lymph node was negative. Left simple mastectomy showed LCIS, ADH, no malignancy.     08/13/2016 Oncotype testing   Oncotype recurrence score 11, which predicts 10 year distant recurrence risk of 7% with tamoxifen alone, low risk category. No adjuvant chemo recommended  based on this.     09/25/2016 -  Anti-estrogen oral therapy   Adjuvant exemestane 33m daily     11/05/2016 Surgery   REMOVAL OF BILATERAL TISSUE EXPANDERS WITH PLACEMENT OF BILATERAL BREAST SALINE IMPLANTS by Dr. TIran Planas       CURRENT THERAPY:  Exemestane  (Aromasin) 25 mg daily started on 09/25/16, Plan for 5-7 years.  INTERVAL HISTORY:  PMAMMIE MERASwas contacted for a follow up of right multifocal breast cancer. She was last seen by me on 12/17/20.  She reports general improvement in her health since stopping the exemestane-- sleeping better, no more headaches, improved joint pain.   All other systems were reviewed with the patient and are negative.  MEDICAL HISTORY:  Past Medical History:  Diagnosis Date   Anxiety    Arthritis    OA hips   Asthma    allergy and exercise induced asthma    Depression    Diabetes mellitus without complication ([facility]    Family history of breast cancer    Family history of colon cancer    Family history of stomach cancer    GERD (gastroesophageal reflux disease)    Hypertension    Malignant neoplasm of lower-outer quadrant of right female breast ([facility] 06/21/2016   Seasonal allergies     SURGICAL HISTORY: Past Surgical History:  Procedure Laterality Date   BREAST LUMPECTOMY     right    BREAST RECONSTRUCTION WITH PLACEMENT OF TISSUE EXPANDER AND FLEX HD (ACELLULAR HYDRATED DERMIS) Bilateral 08/13/2016   Procedure: BREAST RECONSTRUCTION WITH PLACEMENT OF TISSUE EXPANDER AND ALLODERM;  Surgeon: BIrene Limbo MD;  [place]  Service: Plastics;  Laterality: Bilateral;   COLONOSCOPY N/A 10/31/2017   Procedure: COLONOSCOPY;  Surgeon: FDanie Binder MD;  [facility];  Service: Endoscopy;  Laterality: N/A;  8:30 Am   DILATION AND CURETTAGE OF UTERUS     JOINT REPLACEMENT     LIPOSUCTION WITH LIPOFILLING Bilateral 11/05/2016   Procedure: LIPOFILLING FROM ABDOMEN TO BILATERAL CHEST;  Surgeon: BIrene Limbo MD;  [place]  Service: Plastics;  Laterality: Bilateral;   MASTECTOMY W/ SENTINEL NODE BIOPSY Bilateral 08/13/2016   Procedure: BILATERAL TOTAL MASTECTOMY WITH RIGHT AXILLARY SENTINEL LYMPH NODE BIOPSY;  Surgeon: BExcell Seltzer  MD;  [place]  Service: General;  Laterality: Bilateral;   REMOVAL OF BILATERAL TISSUE EXPANDERS WITH PLACEMENT OF BILATERAL BREAST IMPLANTS Bilateral 11/05/2016   Procedure: REMOVAL OF BILATERAL TISSUE EXPANDERS WITH PLACEMENT OF BILATERAL BREAST SALINE IMPLANTS;  Surgeon: BIrene Limbo MD;  [place]  Service: Plastics;  Laterality: Bilateral;   TOTAL HIP ARTHROPLASTY Right 01/07/2016   Procedure: TOTAL HIP ARTHROPLASTY ANTERIOR APPROACH;  Surgeon: FGaynelle Arabian MD;  [facility];  Service: Orthopedics;  Laterality: Right;   TOTAL HIP ARTHROPLASTY Left 03/22/2018   Procedure: LEFT TOTAL HIP ARTHROPLASTY ANTERIOR APPROACH;  Surgeon: AGaynelle Arabian MD;  [facility];  Service: Orthopedics;  Laterality: Left;    I have reviewed the social history and family history with the patient and they are unchanged from previous note.  ALLERGIES:  is allergic to corn-containing products, tomato, azithromycin, biaxin [clarithromycin], and codeine.  MEDICATIONS:  Current Outpatient Medications  Medication Sig Dispense Refill   amoxicillin (AMOXIL) 875 MG tablet Take 1 tablet (875 mg total) by mouth 2 (two) times daily. 20 tablet 0   benazepril (LOTENSIN) 20 MG tablet TAKE (1) TABLET BY MOUTH TWICE DAILY. 1[place]  tablet 0   escitalopram (LEXAPRO) 20 MG tablet TAKE ONE TABLET BY MOUTH ONCE DAILY. 90 tablet 0   fexofenadine (ALLEGRA) 60 MG tablet Take 60 mg by mouth 2 (two) times daily.     JANUVIA 100 MG tablet TAKE (1) TABLET BY MOUTH ONCE DAILY. 90 tablet 3   levocetirizine (XYZAL) 5 MG tablet Take 1 tablet (5 mg total) by mouth every evening. For drainage 30 tablet 0   metFORMIN (GLUCOPHAGE) 1000 MG tablet TAKE 1 TABLET BY MOUTH TWICE DAILY WITH MEALS. 180 tablet 0   omeprazole (PRILOSEC) 20 MG capsule TAKE (1) CAPSULE BY MOUTH ONCE DAILY. 90 capsule 0   tamoxifen (NOLVADEX) 20 MG tablet Take 1 tablet (20 mg total) by mouth daily. 30 tablet 3    No current facility-administered medications for this visit.    PHYSICAL EXAMINATION: ECOG PERFORMANCE STATUS: 0 - Asymptomatic  There were no vitals filed for this visit. There were no vitals filed for this visit.  No vitals taken today, Exam not performed today  LABORATORY DATA:  I have reviewed the data as listed CBC Latest Ref Rng & Units 12/17/2020 06/20/2020 02/19/2020  WBC 4.0 - 10.5 K/uL 6.4 5.8 4.4  Hemoglobin 12.0 - 15.0 g/dL 13.4 13.5 12.7  Hematocrit 36.0 - 46.0 % 40.4 40.8 38.6  Platelets 150 - 400 K/uL 284 321 305     CMP Latest Ref Rng & Units 12/17/2020 06/20/2020 02/19/2020  Glucose 70 - 99 mg/dL 145(H) 87 157(H)  BUN 8 - 23 mg/dL _0 Creatinine 0.44 - 1.00 mg/dL 0.78 0.73 0.66  Sodium 135 - 145 mmol/L 141 138 137  Potassium 3.5 - 5.1 mmol/L 4.1 4.1 4.1  Chloride 98 - 111 mmol/L 102 102 101  CO2 22 - 32 mmol/L _1 Calcium 8.9 - 10.3 mg/dL 9.6 9.9 9.6  Total Protein 6.5 - 8.1 g/dL 7.1 7.7 6.8  Total Bilirubin 0.3 - 1.2 mg/dL 0.4 0.4 0.4  Alkaline Phos 38 - 126 U/L 97 108 -  AST 15 - 41 U/L _2 ALT 0 - 44 U/L _3 RADIOGRAPHIC STUDIES: I have personally reviewed the radiological images as listed and agreed with the findings in the report. No results found.   ASSESSMENT & PLAN:  Carolyn Glass is a 65 y.o. female with   1. Malignant neoplasm of lower-outer quadrant of right female breast, invasive lobular carcinoma and LCIS, mpT1cN0M0, stage IA, G2, ER+/PR+/HER2-, oncotype low risk  -She was diagnosed in 06/2016. She is s/p b/l mastectomy and breast reconstruction surgery.  -Oncotype DX score was 11 which is at low risk. Therefore I do not recommend chemotherapy. Given she was node negative post-mastectomy radiation was not recommended. -She has been on anti-estrogen therapy with exemestane since 09/2016. Plan for total 7-10 years due to lobular histology.  -She has held the exemestane since 12/2020 due to AEs and notes  improvement. Accordingly, we will switch her to tamoxifen. I advised her to begin with 10 mg for 2 weeks, then go up to 20 mg. -She is otherwise clinically doing well. Continue surveillance. No need for mammogram given s/p b/l mastectomy. -f/u in 3 months   2. Right hip stiffness, Arthritis  -She is s/p b/l hip replacements. She occasionally has right hip stiffness -Will monitor and f/u with Orthopedics in interim. -She notes left hand joint pain, stiffness and puffiness. This could be arthritis and partly from Exemestane. Patient notes  this has slowly increased over the years. -Her pain has improved off exemestane. Accordingly we are switching her to tamoxifen.   3. Osteopenia -04/28/15 DEXA showed osteopenia with lowest T-score -1.7 at [facility]. Her 05/31/18 DEXA showed lowest T-score at -1.9, slightly worse than previous scan.  -Will continue Vitamin D. She is fine to take Calcium 500-600 units daily as her Calcium level has been normal lately.  -Continue to monitor with DEXA every 2 years. She can repeat in 07/2020 with PCP.   4. Cancer Screening, Genetic testing negative for pathogenetic mutations with VUS in MSH2, c.2650A>T. -I have encouraged her to keep up with age appropriate cancer screenings. Her last colonoscopy in 10/2017 showed beginning stages of diverticulitis.    5. DM, type 2 -Continue medications and f/u with PCP.    6. B12 deficiency -On oral B12. No anemia     PLAN -start tamoxifen, half dose/tablet (10 mg) for two weeks, then full dose  -Lab and f/u in 3 months    No problem-specific Assessment & Plan notes found for this encounter.   No orders of the defined types were placed in this encounter.  All questions were answered. The patient knows to call the clinic with any problems, questions or concerns. No barriers to learning was detected. The total time spent in the appointment was 15 minutes.     [name], MD 03/19/2021   I, Wilburn Mylar, am acting  as scribe for [name], MD.   I have reviewed the above documentation for accuracy and completeness, and I agree with the above.

## 2021-03-27 ENCOUNTER — Other Ambulatory Visit: Payer: Self-pay | Admitting: Family Medicine

## 2021-03-30 ENCOUNTER — Other Ambulatory Visit: Payer: Self-pay

## 2021-03-30 ENCOUNTER — Encounter: Payer: Self-pay | Admitting: Family Medicine

## 2021-03-30 ENCOUNTER — Ambulatory Visit: Payer: Medicare PPO | Admitting: Family Medicine

## 2021-03-30 VITALS — BP 140/68 | HR 86 | Temp 98.5°F | Resp 14 | Ht 63.0 in | Wt 168.0 lb

## 2021-03-30 DIAGNOSIS — L723 Sebaceous cyst: Secondary | ICD-10-CM | POA: Diagnosis not present

## 2021-03-30 NOTE — Progress Notes (Signed)
Subjective:    Patient ID: Carolyn Glass, female    DOB: [birth year], 65 y.o.   MRN: [id number] Patient presents today with 2 sebaceous cyst.  1 is located in the center of her back roughly around the level of T10.  Another 1 is located in the center of her back a little bit higher around T1 or T2.  Both are approximately 5 cm in diameter.  She also has 2 or 3 seborrheic keratosis scattered around her back.  Each of these is approximately 5 to 10 mm in diameter and are brown warty papules with clearly defined margins. Past Medical History:  Diagnosis Date   Anxiety    Arthritis    OA hips   Asthma    allergy and exercise induced asthma    Depression    Diabetes mellitus without complication ([place])    Family history of breast cancer    Family history of colon cancer    Family history of stomach cancer    GERD (gastroesophageal reflux disease)    Hypertension    Malignant neoplasm of lower-outer quadrant of right female breast ([place]) 06/21/2016   Seasonal allergies    Past Surgical History:  Procedure Laterality Date   BREAST LUMPECTOMY     right    BREAST RECONSTRUCTION WITH PLACEMENT OF TISSUE EXPANDER AND FLEX HD (ACELLULAR HYDRATED DERMIS) Bilateral 08/13/2016   Procedure: BREAST RECONSTRUCTION WITH PLACEMENT OF TISSUE EXPANDER AND ALLODERM;  Surgeon: Irene Limbo, MD;  Location: [place];  Service: Plastics;  Laterality: Bilateral;   COLONOSCOPY N/A 10/31/2017   Procedure: COLONOSCOPY;  Surgeon: Danie Binder, MD;  [facility];  Service: Endoscopy;  Laterality: N/A;  8:30 Am   DILATION AND CURETTAGE OF UTERUS     JOINT REPLACEMENT     LIPOSUCTION WITH LIPOFILLING Bilateral 11/05/2016   Procedure: LIPOFILLING FROM ABDOMEN TO BILATERAL CHEST;  Surgeon: Irene Limbo, MD;  Location: [place];  Service: Plastics;  Laterality: Bilateral;   MASTECTOMY W/ SENTINEL NODE BIOPSY Bilateral 08/13/2016   Procedure: BILATERAL TOTAL MASTECTOMY  WITH RIGHT AXILLARY SENTINEL LYMPH NODE BIOPSY;  Surgeon: [name], MD;  Location: [place];  Service: General;  Laterality: Bilateral;   REMOVAL OF BILATERAL TISSUE EXPANDERS WITH PLACEMENT OF BILATERAL BREAST IMPLANTS Bilateral 11/05/2016   Procedure: REMOVAL OF BILATERAL TISSUE EXPANDERS WITH PLACEMENT OF BILATERAL BREAST SALINE IMPLANTS;  Surgeon: Irene Limbo, MD;  Location: [place];  Service: Plastics;  Laterality: Bilateral;   TOTAL HIP ARTHROPLASTY Right 01/07/2016   Procedure: TOTAL HIP ARTHROPLASTY ANTERIOR APPROACH;  Surgeon: Gaynelle Arabian, MD;  [facility];  Service: Orthopedics;  Laterality: Right;   TOTAL HIP ARTHROPLASTY Left 03/22/2018   Procedure: LEFT TOTAL HIP ARTHROPLASTY ANTERIOR APPROACH;  Surgeon: Gaynelle Arabian, MD;  [facility];  Service: Orthopedics;  Laterality: Left;   Current Outpatient Medications on File Prior to Visit  Medication Sig Dispense Refill   benazepril (LOTENSIN) 20 MG tablet TAKE (1) TABLET BY MOUTH TWICE DAILY. 180 tablet 0   escitalopram (LEXAPRO) 20 MG tablet TAKE ONE TABLET BY MOUTH ONCE DAILY. 90 tablet 0   fexofenadine (ALLEGRA) 60 MG tablet Take 60 mg by mouth 2 (two) times daily.     JANUVIA 100 MG tablet TAKE (1) TABLET BY MOUTH ONCE DAILY. 90 tablet 3   metFORMIN (GLUCOPHAGE) 1000 MG tablet TAKE 1 TABLET BY MOUTH TWICE DAILY WITH MEALS. 180 tablet 0   omeprazole (PRILOSEC) 20 MG capsule  TAKE (1) CAPSULE BY MOUTH ONCE DAILY. 90 capsule 0   No current facility-administered medications on file prior to visit.   Allergies  Allergen Reactions   Corn-Containing Products Itching    Running nose   Tomato Itching    Running nose    Azithromycin Itching and Rash   Biaxin [Clarithromycin] Rash   Codeine Rash    Thinks a codeine cough syrup caused rash.   Social History   Socioeconomic History   Marital status: Married    Spouse name: Not on file   Number of children: 1   Years of  education: Not on file   Highest education level: Not on file  Occupational History   Not on file  Tobacco Use   Smoking status: Never   Smokeless tobacco: Never  Vaping Use   Vaping Use: Never used  Substance and Sexual Activity   Alcohol use: No   Drug use: No   Sexual activity: Yes  Other Topics Concern   Not on file  Social History Narrative   Not on file   Social Determinants of Health   Financial Resource Strain: Not on file  Food Insecurity: Not on file  Transportation Needs: Not on file  Physical Activity: Not on file  Stress: Not on file  Social Connections: Not on file  Intimate Partner Violence: Not on file   Family History  Problem Relation Age of Onset   Arthritis Mother    Asthma Mother    Diabetes Mother    Hyperlipidemia Mother    Hypertension Mother    Arthritis Father    Asthma Father    Stroke Father    Hyperlipidemia Father    Early death Brother 62       Muscular Dystrophy   Birth defects Brother    Cancer Maternal Uncle        possibly stomach   Mental retardation Maternal Uncle    Cancer Paternal Grandmother 82       breast cancer    Cancer Cousin 44       paternal cousin's daughter with breast cancer   Colon cancer Maternal Aunt        dx in her 10s   Leukemia Paternal Uncle    Lymphoma Paternal Uncle    Down syndrome Paternal Uncle       Review of Systems  All other systems reviewed and are negative.     Objective:   Physical Exam Vitals reviewed.  Constitutional:      General: She is not in acute distress.    Appearance: Normal appearance. She is well-developed. She is not ill-appearing, toxic-appearing or diaphoretic.  HENT:     Right Ear: Tympanic membrane and ear canal normal.     Left Ear: Tympanic membrane and ear canal normal.     Mouth/Throat:     Pharynx: Posterior oropharyngeal erythema present. No oropharyngeal exudate.  Cardiovascular:     Rate and Rhythm: Normal rate and regular rhythm.     Heart sounds:  Normal heart sounds. No murmur heard.   No friction rub. No gallop.  Pulmonary:     Effort: Pulmonary effort is normal. No respiratory distress.     Breath sounds: Normal breath sounds. No wheezing or rales.  Chest:     Chest wall: No tenderness.  Musculoskeletal:        General: No tenderness or deformity. Normal range of motion.     Cervical back: Normal range of motion and neck supple.  Back:     Right foot: Normal. No swelling or deformity.     Left foot: Normal. No swelling, deformity, tenderness or bony tenderness.  Skin:    General: Skin is warm.     Coloration: Skin is not pale.     Findings: No erythema or rash.  Neurological:     Mental Status: She is alert.          Assessment & Plan:  Sebaceous cyst - Plan: Ambulatory referral to Dermatology Patient would like to see a dermatologist to have the sebaceous cyst removed.  I will place that referral.  I reassured the patient that the seborrheic keratoses are not malignant and require no treatment other than excision if they cause her any type of symptoms.  At the present time she is comfortable leaving them alone.

## 2021-04-13 ENCOUNTER — Other Ambulatory Visit: Payer: Self-pay | Admitting: Family Medicine

## 2021-04-13 DIAGNOSIS — K219 Gastro-esophageal reflux disease without esophagitis: Secondary | ICD-10-CM

## 2021-05-19 ENCOUNTER — Other Ambulatory Visit: Payer: Self-pay | Admitting: Family Medicine

## 2021-05-25 DIAGNOSIS — L720 Epidermal cyst: Secondary | ICD-10-CM | POA: Diagnosis not present

## 2021-05-25 DIAGNOSIS — L820 Inflamed seborrheic keratosis: Secondary | ICD-10-CM | POA: Diagnosis not present

## 2021-05-25 DIAGNOSIS — L538 Other specified erythematous conditions: Secondary | ICD-10-CM | POA: Diagnosis not present

## 2021-05-25 DIAGNOSIS — Z789 Other specified health status: Secondary | ICD-10-CM | POA: Diagnosis not present

## 2021-06-04 DIAGNOSIS — L728 Other follicular cysts of the skin and subcutaneous tissue: Secondary | ICD-10-CM | POA: Diagnosis not present

## 2021-06-04 DIAGNOSIS — Z789 Other specified health status: Secondary | ICD-10-CM | POA: Diagnosis not present

## 2021-06-04 DIAGNOSIS — L538 Other specified erythematous conditions: Secondary | ICD-10-CM | POA: Diagnosis not present

## 2021-06-17 ENCOUNTER — Inpatient Hospital Stay: Payer: Medicare PPO | Attending: Hematology | Admitting: Hematology

## 2021-06-17 ENCOUNTER — Other Ambulatory Visit: Payer: Self-pay

## 2021-06-17 ENCOUNTER — Encounter: Payer: Self-pay | Admitting: Hematology

## 2021-06-17 ENCOUNTER — Inpatient Hospital Stay: Payer: Medicare PPO

## 2021-06-17 VITALS — BP 158/83 | HR 87 | Temp 98.5°F | Resp 18 | Ht 63.0 in | Wt 171.2 lb

## 2021-06-17 DIAGNOSIS — E538 Deficiency of other specified B group vitamins: Secondary | ICD-10-CM | POA: Diagnosis not present

## 2021-06-17 DIAGNOSIS — C50511 Malignant neoplasm of lower-outer quadrant of right female breast: Secondary | ICD-10-CM

## 2021-06-17 DIAGNOSIS — M8580 Other specified disorders of bone density and structure, unspecified site: Secondary | ICD-10-CM | POA: Diagnosis not present

## 2021-06-17 DIAGNOSIS — Z170 Estrogen receptor positive status [ER+]: Secondary | ICD-10-CM | POA: Insufficient documentation

## 2021-06-17 DIAGNOSIS — Z79810 Long term (current) use of selective estrogen receptor modulators (SERMs): Secondary | ICD-10-CM | POA: Insufficient documentation

## 2021-06-17 DIAGNOSIS — Z9013 Acquired absence of bilateral breasts and nipples: Secondary | ICD-10-CM | POA: Insufficient documentation

## 2021-06-17 LAB — CBC WITH DIFFERENTIAL/PLATELET
Abs Immature Granulocytes: 0.01 10*3/uL (ref 0.00–0.07)
Basophils Absolute: 0 10*3/uL (ref 0.0–0.1)
Basophils Relative: 0 %
Eosinophils Absolute: 0.3 10*3/uL (ref 0.0–0.5)
Eosinophils Relative: 5 %
HCT: 38.8 % (ref 36.0–46.0)
Hemoglobin: 13 g/dL (ref 12.0–15.0)
Immature Granulocytes: 0 %
Lymphocytes Relative: 19 %
Lymphs Abs: 1.1 10*3/uL (ref 0.7–4.0)
MCH: 31.3 pg (ref 26.0–34.0)
MCHC: 33.5 g/dL (ref 30.0–36.0)
MCV: 93.5 fL (ref 80.0–100.0)
Monocytes Absolute: 0.5 10*3/uL (ref 0.1–1.0)
Monocytes Relative: 8 %
Neutro Abs: 4 10*3/uL (ref 1.7–7.7)
Neutrophils Relative %: 68 %
Platelets: 235 10*3/uL (ref 150–400)
RBC: 4.15 MIL/uL (ref 3.87–5.11)
RDW: 13.3 % (ref 11.5–15.5)
WBC: 5.9 10*3/uL (ref 4.0–10.5)
nRBC: 0 % (ref 0.0–0.2)

## 2021-06-17 LAB — CMP (CANCER CENTER ONLY)
ALT: 25 U/L (ref 0–44)
AST: 25 U/L (ref 15–41)
Albumin: 3.8 g/dL (ref 3.5–5.0)
Alkaline Phosphatase: 70 U/L (ref 38–126)
Anion gap: 11 (ref 5–15)
BUN: 17 mg/dL (ref 8–23)
CO2: 25 mmol/L (ref 22–32)
Calcium: 9.4 mg/dL (ref 8.9–10.3)
Chloride: 103 mmol/L (ref 98–111)
Creatinine: 0.69 mg/dL (ref 0.44–1.00)
GFR, Estimated: >60 mL/min (ref >60)
Glucose, Bld: 248 mg/dL — ABNORMAL HIGH (ref 70–99)
Potassium: 3.9 mmol/L (ref 3.5–5.1)
Sodium: 139 mmol/L (ref 135–145)
Total Bilirubin: 0.4 mg/dL (ref 0.3–1.2)
Total Protein: 7 g/dL (ref 6.5–8.1)

## 2021-06-17 MED ORDER — TAMOXIFEN CITRATE 20 MG PO TABS: 20.0000 mg | ORAL_TABLET | Freq: Every day | ORAL | 1 refills | Status: DC

## 2021-06-17 NOTE — Progress Notes (Signed)
[place]   Telephone:([phone]) [phone] Fax:(336) [phone]   Clinic Follow up Note   Patient Care Team: [name], MD as PCP - General (Family Medicine) [name], MD (Inactive) as Consulting Physician (General Surgery) [name], MD as Consulting Physician (Hematology) [name], MD as Attending Physician (Radiation Oncology) [name], NP as Nurse Practitioner (Hematology and Oncology)  Date of Service:  06/17/2021  CHIEF COMPLAINT: f/u of right multifocal breast cancer  CURRENT THERAPY:  Exemestane (Aromasin) 25 mg daily started on 09/25/16, switched to tamoxifen on 03/19/21  ASSESSMENT & PLAN:  Carolyn Glass is a 65 y.o. female with   1. Malignant neoplasm of lower-outer quadrant of right female breast, invasive lobular carcinoma and LCIS, mpT1cN0M0, stage IA, G2, ER+/PR+/HER2-, oncotype low risk  -She was diagnosed in 06/2016. She is s/p b/l mastectomy and breast reconstruction surgery.  -Oncotype DX score was 11 which is at low risk. Therefore I do not recommend chemotherapy. Given she was node negative post-mastectomy radiation was not recommended. -She has been on anti-estrogen therapy with exemestane since 09/2016. Plan for total 7-10 years due to lobular histology.  -She has held the exemestane since 12/2020 due to AEs and notes improvement. She switched to tamoxifen in 03/2021 and has had no issues so far. Will continue until 09/2026 if she tolerates well  -She is clinically doing well. Continue surveillance. No need for mammogram given s/p b/l mastectomy. -f/u in 6 months    2. Right hip stiffness, Arthritis  -She is s/p b/l hip replacements. She occasionally has right hip stiffness -Will monitor and f/u with Orthopedics in interim. -she experienced joint pain and swelling in her hands that improved off exemestane. Will continue on tamoxifen.   3. Osteopenia -04/28/15 DEXA showed osteopenia with lowest T-score -1.7 at [facility]  spine. Her 05/31/18 DEXA showed lowest T-score at -1.9, slightly worse than previous scan.  -Will continue Vitamin D. She is fine to take Calcium 500-600 units daily as her Calcium level has been normal lately.  -Continue to monitor with DEXA every 2 years. She can repeat in 07/2020 with PCP.   4. Cancer Screening, Genetic testing negative for pathogenetic mutations with VUS in MSH2, c.2650A>T. -I have encouraged her to keep up with age appropriate cancer screenings.  -Her last colonoscopy in 10/2017 showed beginning stages of diverticulitis. She is due for repeat 2024-2026. -She was recently told her pap smear would not be covered. She is working on this.   5. DM, type 2 -Continue medications and f/u with PCP.    6. B12 deficiency -On oral B12. No anemia      PLAN -continue tamoxifen -Lab and f/u with me in 6 months    No problem-specific Assessment & Plan notes found for this encounter.   SUMMARY OF ONCOLOGIC HISTORY: Oncology History Overview Note  Malignant neoplasm of lower-outer quadrant of right female breast [facility])   Staging form: Breast, AJCC 7th Edition   - Clinical stage from 06/23/2016: Stage IA (T1c, N0, M0) - Unsigned    Malignant neoplasm of lower-outer quadrant of right female breast ([place])  04/28/2015 Imaging   Bone Density Report 04/28/2015 [facility] (L1-L4): T-Score = -1.7. Osteopenia Femoral Neck (Left) : T-Score = -1.0 Normal Femoral Neck (Right): T-Score = 0.4 Normal   03/01/2016 Mammogram   Bilateral screening mammogram was negative   06/08/2016 Imaging   Bilateral breast MRI with and without contrast showed 4 lesions in the right breast, 2 adjacent 1.4 cm  mass in the inferior aspect of the right breast, and in the right lateral aspect of the breast, there are 8 mm and a 7 mm irregular enhancing mass. Multiple small enhancing lesions within the liver, indeterminate. No axillary adenopathy.   06/16/2016 Initial Diagnosis   Malignant neoplasm of lower-outer  quadrant of right female breast ([place])   06/16/2016 Initial Biopsy   Right breast needle core biopsy at 9 clock, 6:30 clock, 6 clock showed invasive lobular carcinoma, and LCIS   06/16/2016 Receptors her2   two right breast biopsy showed ER strongly positive, PR strongly positive, HER-2 negative, Ki-67 5-10%    06/16/2016 Imaging   Right breast and axilla ultrasound showed a 1.4 cm mass at the 6:30 o'clock, 1.0 cm mass at the 6:00, and 78m mass at 9:00 position. Axilla was negative.   06/28/2016 Imaging   MRI ABDOMEN W W/O CONTRAST 06/28/2016 IMPRESSION: 1. Multiple hepatic hemangiomas.  No suspicious liver lesion. 2. Hepatic steatosis. 3. No evidence of abdominal metastasis.   06/28/2016 Genetic Testing   Patient has genetic testing done for breast cancer. Results revealed patient has the following variant called, MSH2 c.2650A>T.   08/13/2016 Surgery   Bilateral simple mastectomy and right SLN biopsy     08/13/2016 Pathology Results   Right mastectomy showed invasive ductal carcinoma, G2, multifocal, 1.5cm, 1.5cm, and 1.0cm. (+) LCIS, margins are negative, 1 sentinel lymph node was negative. Left simple mastectomy showed LCIS, ADH, no malignancy.    08/13/2016 Oncotype testing   Oncotype recurrence score 11, which predicts 10 year distant recurrence risk of 7% with tamoxifen alone, low risk category. No adjuvant chemo recommended based on this.    09/25/2016 -  Anti-estrogen oral therapy   Adjuvant exemestane 275mdaily    11/05/2016 Surgery   REMOVAL OF BILATERAL TISSUE EXPANDERS WITH PLACEMENT OF BILATERAL BREAST SALINE IMPLANTS by Dr. ThIran Planas     INTERVAL HISTORY:  Carolyn KOSKELAs here for a follow up of breast cancer. She was last seen by me on 12/17/20 with phone visit in the interim. She presents to the clinic alone. She reports she has not had any recurrent headaches or joint swelling/pain since starting tamoxifen. She denies any new side effects, however she notes  she has gained a few pounds. She notes her current insurance did not cover her pap smear. She notes she hopes to change insurance during open enrollment. She reports her daughter is pregnant with a baby girl, due 08/2021.   All other systems were reviewed with the patient and are negative.  MEDICAL HISTORY:  Past Medical History:  Diagnosis Date   Anxiety    Arthritis    OA hips   Asthma    allergy and exercise induced asthma    Depression    Diabetes mellitus without complication ([facility]   Family history of breast cancer    Family history of colon cancer    Family history of stomach cancer    GERD (gastroesophageal reflux disease)    Hypertension    Malignant neoplasm of lower-outer quadrant of right female breast ([facility]   Seasonal allergies     SURGICAL HISTORY: Past Surgical History:  Procedure Laterality Date   BREAST LUMPECTOMY     right    BREAST RECONSTRUCTION WITH PLACEMENT OF TISSUE EXPANDER AND FLEX HD (ACELLULAR HYDRATED DERMIS) Bilateral 08/13/2016   Procedure: BREAST RECONSTRUCTION WITH PLACEMENT OF TISSUE EXPANDER AND ALLODERM;  Surgeon: BrIrene LimboMD;  [facility]  Service: Clinical cytogeneticist;  Laterality: Bilateral;   COLONOSCOPY N/A 10/31/2017   Procedure: COLONOSCOPY;  Surgeon: Danie Binder, MD;  [facility];  Service: Endoscopy;  Laterality: N/A;  8:30 Am   DILATION AND CURETTAGE OF UTERUS     JOINT REPLACEMENT     LIPOSUCTION WITH LIPOFILLING Bilateral 11/05/2016   Procedure: LIPOFILLING FROM ABDOMEN TO BILATERAL CHEST;  Surgeon: Irene Limbo, MD;  Location: [place];  Service: Plastics;  Laterality: Bilateral;   MASTECTOMY W/ SENTINEL NODE BIOPSY Bilateral 08/13/2016   Procedure: BILATERAL TOTAL MASTECTOMY WITH RIGHT AXILLARY SENTINEL LYMPH NODE BIOPSY;  Surgeon: [name], MD;  Location: [place];  Service: General;  Laterality: Bilateral;   REMOVAL OF BILATERAL TISSUE  EXPANDERS WITH PLACEMENT OF BILATERAL BREAST IMPLANTS Bilateral 11/05/2016   Procedure: REMOVAL OF BILATERAL TISSUE EXPANDERS WITH PLACEMENT OF BILATERAL BREAST SALINE IMPLANTS;  Surgeon: Irene Limbo, MD;  Location: [place];  Service: Plastics;  Laterality: Bilateral;   TOTAL HIP ARTHROPLASTY Right 01/07/2016   Procedure: TOTAL HIP ARTHROPLASTY ANTERIOR APPROACH;  Surgeon: Gaynelle Arabian, MD;  Location: [facility];  Service: Orthopedics;  Laterality: Right;   TOTAL HIP ARTHROPLASTY Left 03/22/2018   Procedure: LEFT TOTAL HIP ARTHROPLASTY ANTERIOR APPROACH;  Surgeon: Gaynelle Arabian, MD;  Location: [facility];  Service: Orthopedics;  Laterality: Left;    I have reviewed the social history and family history with the patient and they are unchanged from previous note.  ALLERGIES:  is allergic to corn-containing products, tomato, azithromycin, biaxin [clarithromycin], and codeine.  MEDICATIONS:  Current Outpatient Medications  Medication Sig Dispense Refill   tamoxifen (NOLVADEX) 20 MG tablet Take 1 tablet (20 mg total) by mouth daily. 90 tablet 1   benazepril (LOTENSIN) 20 MG tablet TAKE (1) TABLET BY MOUTH TWICE DAILY. 180 tablet 0   escitalopram (LEXAPRO) 20 MG tablet TAKE ONE TABLET BY MOUTH ONCE DAILY. 90 tablet 0   fexofenadine (ALLEGRA) 60 MG tablet Take 60 mg by mouth 2 (two) times daily.     JANUVIA 100 MG tablet TAKE (1) TABLET BY MOUTH ONCE DAILY. 90 tablet 3   metFORMIN (GLUCOPHAGE) 1000 MG tablet TAKE 1 TABLET BY MOUTH TWICE DAILY WITH MEALS. 180 tablet 0   omeprazole (PRILOSEC) 20 MG capsule TAKE (1) CAPSULE BY MOUTH ONCE DAILY. 90 capsule 0   No current facility-administered medications for this visit.    PHYSICAL EXAMINATION: ECOG PERFORMANCE STATUS: 0 - Asymptomatic  Vitals:   06/17/21 1112  BP: (!) 158/83  Pulse: 87  Resp: 18  Temp: 98.5 F (36.9 C)  SpO2: 98%   Wt Readings from Last 3 Encounters:  06/17/21 171 lb 3.2 oz (77.7 kg)  03/30/21 168 lb (76.2  kg)  12/17/20 170 lb 11.2 oz (77.4 kg)     GENERAL:alert, no distress and comfortable SKIN: skin color, texture, turgor are normal, no rashes or significant lesions EYES: normal, Conjunctiva are pink and non-injected, sclera clear  NECK: supple, thyroid normal size, non-tender, without nodularity LYMPH:  no palpable lymphadenopathy in the cervical, axillary  LUNGS: clear to auscultation and percussion with normal breathing effort HEART: regular rate & rhythm and no murmurs and no lower extremity edema ABDOMEN:abdomen soft, non-tender and normal bowel sounds Musculoskeletal:no cyanosis of digits and no clubbing  NEURO: alert & oriented x 3 with fluent speech, no focal motor/sensory deficits BREAST: No palpable mass, nodules or adenopathy bilaterally. Breast exam benign.   LABORATORY DATA:  I have reviewed the data as listed CBC  Latest Ref Rng & Units 06/17/2021 12/17/2020 06/20/2020  WBC 4.0 - 10.5 K/uL 5.9 6.4 5.8  Hemoglobin 12.0 - 15.0 g/dL 13.0 13.4 13.5  Hematocrit 36.0 - 46.0 % 38.8 40.4 40.8  Platelets 150 - 400 K/uL 235 284 321     CMP Latest Ref Rng & Units 06/17/2021 12/17/2020 06/20/2020  Glucose 70 - 99 mg/dL 248(H) 145(H) 87  BUN 8 - 23 mg/dL 17 11 10   Creatinine 0.44 - 1.00 mg/dL 0.69 0.78 0.73  Sodium 135 - 145 mmol/L 139 141 138  Potassium 3.5 - 5.1 mmol/L 3.9 4.1 4.1  Chloride 98 - 111 mmol/L 103 102 102  CO2 22 - 32 mmol/L 25 26 31   Calcium 8.9 - 10.3 mg/dL 9.4 9.6 9.9  Total Protein 6.5 - 8.1 g/dL 7.0 7.1 7.7  Total Bilirubin 0.3 - 1.2 mg/dL 0.4 0.4 0.4  Alkaline Phos 38 - 126 U/L 70 97 108  AST 15 - 41 U/L 25 20 22   ALT 0 - 44 U/L 25 28 30       RADIOGRAPHIC STUDIES: I have personally reviewed the radiological images as listed and agreed with the findings in the report. No results found.    No orders of the defined types were placed in this encounter.  All questions were answered. The patient knows to call the clinic with any problems, questions or  concerns. No barriers to learning was detected. The total time spent in the appointment was 25 minutes.     [name], MD 06/17/2021   I, Wilburn Mylar, am acting as scribe for [name], MD.   I have reviewed the above documentation for accuracy and completeness, and I agree with the above.

## 2021-06-29 ENCOUNTER — Other Ambulatory Visit: Payer: Self-pay | Admitting: Family Medicine

## 2021-07-06 DIAGNOSIS — L538 Other specified erythematous conditions: Secondary | ICD-10-CM | POA: Diagnosis not present

## 2021-07-06 DIAGNOSIS — L728 Other follicular cysts of the skin and subcutaneous tissue: Secondary | ICD-10-CM | POA: Diagnosis not present

## 2021-07-06 DIAGNOSIS — Z789 Other specified health status: Secondary | ICD-10-CM | POA: Diagnosis not present

## 2021-07-06 DIAGNOSIS — L821 Other seborrheic keratosis: Secondary | ICD-10-CM | POA: Diagnosis not present

## 2021-07-15 ENCOUNTER — Other Ambulatory Visit: Payer: Self-pay | Admitting: Family Medicine

## 2021-07-21 ENCOUNTER — Other Ambulatory Visit: Payer: Self-pay | Admitting: Family Medicine

## 2021-07-21 DIAGNOSIS — K219 Gastro-esophageal reflux disease without esophagitis: Secondary | ICD-10-CM

## 2021-07-23 ENCOUNTER — Other Ambulatory Visit: Payer: Self-pay

## 2021-07-23 ENCOUNTER — Ambulatory Visit (INDEPENDENT_AMBULATORY_CARE_PROVIDER_SITE_OTHER): Payer: Medicare PPO

## 2021-07-23 ENCOUNTER — Other Ambulatory Visit: Payer: Medicare PPO

## 2021-07-23 VITALS — Ht 63.0 in | Wt 166.0 lb

## 2021-07-23 DIAGNOSIS — Z780 Asymptomatic menopausal state: Secondary | ICD-10-CM | POA: Diagnosis not present

## 2021-07-23 DIAGNOSIS — Z79899 Other long term (current) drug therapy: Secondary | ICD-10-CM | POA: Diagnosis not present

## 2021-07-23 DIAGNOSIS — Z8639 Personal history of other endocrine, nutritional and metabolic disease: Secondary | ICD-10-CM | POA: Diagnosis not present

## 2021-07-23 DIAGNOSIS — E559 Vitamin D deficiency, unspecified: Secondary | ICD-10-CM | POA: Diagnosis not present

## 2021-07-23 DIAGNOSIS — M8588 Other specified disorders of bone density and structure, other site: Secondary | ICD-10-CM | POA: Diagnosis not present

## 2021-07-23 DIAGNOSIS — Z0000 Encounter for general adult medical examination without abnormal findings: Secondary | ICD-10-CM | POA: Diagnosis not present

## 2021-07-23 DIAGNOSIS — Z13220 Encounter for screening for lipoid disorders: Secondary | ICD-10-CM | POA: Diagnosis not present

## 2021-07-23 DIAGNOSIS — E119 Type 2 diabetes mellitus without complications: Secondary | ICD-10-CM | POA: Diagnosis not present

## 2021-07-23 DIAGNOSIS — Z136 Encounter for screening for cardiovascular disorders: Secondary | ICD-10-CM | POA: Diagnosis not present

## 2021-07-23 DIAGNOSIS — E539 Vitamin B deficiency, unspecified: Secondary | ICD-10-CM | POA: Diagnosis not present

## 2021-07-23 NOTE — Progress Notes (Signed)
Subjective:   Carolyn Glass is a 65 y.o. female who presents for an Initial Medicare Annual Wellness Visit.  Review of Systems     Cardiac Risk Factors include: advanced age (>68men, >66 women);diabetes mellitus;sedentary lifestyle     Objective:    Today's Vitals   07/23/21 0818  Weight: 166 lb (75.3 kg)  Height: 5\' 3"  (1.6 m)   Body mass index is 29.41 kg/m.  Advanced Directives 07/23/2021 10/17/2019 03/22/2018 03/13/2018 12/12/2017 10/31/2017 06/13/2017  Does Patient Have a Medical Advance Directive? Yes No No No No No -  Type of Paramedic of [place];Living will - - - - - -  Copy of [place] in Chart? No - copy requested - - - - - -  Would patient like information on creating a medical advance directive? - No - Patient declined No - Patient declined No - Patient declined No - Patient declined No - Patient declined No - Patient declined    Current Medications (verified) Outpatient Encounter Medications as of 07/23/2021  Medication Sig   benazepril (LOTENSIN) 20 MG tablet TAKE (1) TABLET BY MOUTH TWICE DAILY.   escitalopram (LEXAPRO) 20 MG tablet TAKE ONE TABLET BY MOUTH ONCE DAILY.   fexofenadine (ALLEGRA) 60 MG tablet Take 60 mg by mouth 2 (two) times daily.   JANUVIA 100 MG tablet TAKE (1) TABLET BY MOUTH ONCE DAILY.   metFORMIN (GLUCOPHAGE) 1000 MG tablet TAKE 1 TABLET BY MOUTH TWICE DAILY WITH MEALS.   omeprazole (PRILOSEC) 20 MG capsule TAKE (1) CAPSULE BY MOUTH ONCE DAILY.   tamoxifen (NOLVADEX) 20 MG tablet Take 1 tablet (20 mg total) by mouth daily.   No facility-administered encounter medications on file as of 07/23/2021.    Allergies (verified) Corn-containing products, Tomato, Azithromycin, Biaxin [clarithromycin], and Codeine   History: Past Medical History:  Diagnosis Date   Anxiety    Arthritis    OA hips   Asthma    allergy and exercise induced asthma    Depression    Diabetes mellitus without complication  ([place])    Family history of breast cancer    Family history of colon cancer    Family history of stomach cancer    GERD (gastroesophageal reflux disease)    Hypertension    Malignant neoplasm of lower-outer quadrant of right female breast ([place]) 06/21/2016   Seasonal allergies    Past Surgical History:  Procedure Laterality Date   BREAST LUMPECTOMY     right    BREAST RECONSTRUCTION WITH PLACEMENT OF TISSUE EXPANDER AND FLEX HD (ACELLULAR HYDRATED DERMIS) Bilateral 08/13/2016   Procedure: BREAST RECONSTRUCTION WITH PLACEMENT OF TISSUE EXPANDER AND ALLODERM;  Surgeon: Irene Limbo, MD;  Location: [facility];  Service: Plastics;  Laterality: Bilateral;   COLONOSCOPY N/A 10/31/2017   Procedure: COLONOSCOPY;  Surgeon: Danie Binder, MD;  [facility];  Service: Endoscopy;  Laterality: N/A;  8:30 Am   DILATION AND CURETTAGE OF UTERUS     JOINT REPLACEMENT     LIPOSUCTION WITH LIPOFILLING Bilateral 11/05/2016   Procedure: LIPOFILLING FROM ABDOMEN TO BILATERAL CHEST;  Surgeon: Irene Limbo, MD;  Location: [facility];  Service: Plastics;  Laterality: Bilateral;   MASTECTOMY W/ SENTINEL NODE BIOPSY Bilateral 08/13/2016   Procedure: BILATERAL TOTAL MASTECTOMY WITH RIGHT AXILLARY SENTINEL LYMPH NODE BIOPSY;  Surgeon: [name], MD;  Location: [facility];  Service: General;  Laterality: Bilateral;   REMOVAL OF BILATERAL TISSUE EXPANDERS WITH  PLACEMENT OF BILATERAL BREAST IMPLANTS Bilateral 11/05/2016   Procedure: REMOVAL OF BILATERAL TISSUE EXPANDERS WITH PLACEMENT OF BILATERAL BREAST SALINE IMPLANTS;  Surgeon: Irene Limbo, MD;  Location: [facility];  Service: Plastics;  Laterality: Bilateral;   TOTAL HIP ARTHROPLASTY Right 01/07/2016   Procedure: TOTAL HIP ARTHROPLASTY ANTERIOR APPROACH;  Surgeon: Gaynelle Arabian, MD;  Location: [facility];  Service: Orthopedics;  Laterality: Right;   TOTAL HIP ARTHROPLASTY Left 03/22/2018    Procedure: LEFT TOTAL HIP ARTHROPLASTY ANTERIOR APPROACH;  Surgeon: Gaynelle Arabian, MD;  Location: [facility];  Service: Orthopedics;  Laterality: Left;   Family History  Problem Relation Age of Onset   Arthritis Mother    Asthma Mother    Diabetes Mother    Hyperlipidemia Mother    Hypertension Mother    Arthritis Father    Asthma Father    Stroke Father    Hyperlipidemia Father    Early death Brother 15       Muscular Dystrophy   Birth defects Brother    Cancer Maternal Uncle        possibly stomach   Mental retardation Maternal Uncle    Cancer Paternal Grandmother 6       breast cancer    Cancer Cousin 66       paternal cousin's daughter with breast cancer   Colon cancer Maternal Aunt        dx in her 68s   Leukemia Paternal Uncle    Lymphoma Paternal Uncle    Down syndrome Paternal Uncle    Social History   Socioeconomic History   Marital status: Married    Spouse name: [name]   Number of children: 1   Years of education: Not on file   Highest education level: Not on file  Occupational History   Not on file  Tobacco Use   Smoking status: Never   Smokeless tobacco: Never  Vaping Use   Vaping Use: Never used  Substance and Sexual Activity   Alcohol use: No   Drug use: No   Sexual activity: Yes  Other Topics Concern   Not on file  Social History Narrative   Married x 37 years in 2022.   1 daughter Brooke   1 granddaughter, Film/video editor   Social Determinants of Radio broadcast assistant Strain: Low Risk    Difficulty of Paying Living Expenses: Not hard at all  Food Insecurity: No Food Insecurity   Worried About Charity fundraiser in the Last Year: Never true   Arboriculturist in the Last Year: Never true  Transportation Needs: No Transportation Needs   Lack of Transportation (Medical): No   Lack of Transportation (Non-Medical): No  Physical Activity: Insufficiently Active   Days of Exercise per Week: 3 days   Minutes of Exercise per Session: 20 min  Stress:  No Stress Concern Present   Feeling of Stress : Only a little  Social Connections: Engineer, building services of Communication with Friends and Family: More than three times a week   Frequency of Social Gatherings with Friends and Family: More than three times a week   Attends Religious Services: More than 4 times per year   Active Member of [employer] or Organizations: Yes   Attends Music therapist: More than 4 times per year   Marital Status: Married    Tobacco Counseling Counseling given: Not Answered   Clinical Intake:  Pre-visit preparation completed: Yes  Pain : No/denies pain  BMI - recorded: 29.41 Nutritional Status: BMI 25 -29 Overweight Nutritional Risks: None Diabetes: No  How often do you need to have someone help you when you read instructions, pamphlets, or other written materials from your doctor or pharmacy?: 1 - Never  Diabetic?Nutrition Risk Assessment:  Has the patient had any N/V/D within the last 2 months?  No  Does the patient have any non-healing wounds?  No  Has the patient had any unintentional weight loss or weight gain?  No   Diabetes:  Is the patient diabetic?  Yes  If diabetic, was a CBG obtained today?  No  Did the patient bring in their glucometer from home?  No  How often do you monitor your CBG's? Checks A1C at office.   Financial Strains and Diabetes Management:  Are you having any financial strains with the device, your supplies or your medication? No .  Does the patient want to be seen by Chronic Care Management for management of their diabetes?  No  Would the patient like to be referred to a Nutritionist or for Diabetic Management?  No   Diabetic Exams:  Diabetic Eye Exam: Completed 03/2021. Pt has been advised about the importance in completing this exam.   Diabetic Foot Exam: Completed Overdue. Pt has been advised about the importance in completing this exam.   Interpreter Needed?: No  Information entered  by :: MJ Gwenda Heiner, LPN   Activities of Daily Living In your present state of health, do you have any difficulty performing the following activities: 07/23/2021  Hearing? N  Vision? N  Difficulty concentrating or making decisions? Y  Walking or climbing stairs? N  Dressing or bathing? N  Doing errands, shopping? N  Preparing Food and eating ? N  Using the Toilet? N  In the past six months, have you accidently leaked urine? Y  Do you have problems with loss of bowel control? N  Managing your Medications? N  Managing your Finances? N  Housekeeping or managing your Housekeeping? N  Some recent data might be hidden    Patient Care Team: [name], MD as PCP - General (Family Medicine) [name], MD (Inactive) as Consulting Physician (General Surgery) [name], MD as Consulting Physician (Hematology) [name], MD as Attending Physician (Radiation Oncology) [name], NP as Nurse Practitioner (Hematology and Oncology)  Indicate any recent Medical Services you may have received from other than [facility] providers in the past year (date may be approximate).     Assessment:   This is a routine wellness examination for [place].  Hearing/Vision screen Hearing Screening - Comments:: No hearing issues.  Vision Screening - Comments:: Glasses. Dr. Jorja Loa. 2022.  Dietary issues and exercise activities discussed: Current Exercise Habits: Home exercise routine, Type of exercise: walking, Time (Minutes): 20, Frequency (Times/Week): 3, Weekly Exercise (Minutes/Week): 60, Intensity: Mild, Exercise limited by: cardiac condition(s);orthopedic condition(s)   Goals Addressed             This Visit's Progress    DIET - REDUCE CALORIE INTAKE       Pt states she would like to lose about 10 lbs in the next year.        Depression Screen PHQ 2/9 Scores 07/23/2021 06/13/2018 05/10/2018 11/03/2017 05/12/2017 05/06/2016  PHQ - 2 Score 1 0 0 2 2 0  PHQ- 9 Score - - - 4 7  1     Fall Risk Fall Risk  07/23/2021 06/13/2018 05/10/2018 05/06/2016  Falls in the past year? 0  No No Yes  Number falls in past yr: 0 - - 1  Injury with Fall? 0 - - No  Risk for fall due to : No Fall Risks - - -  Follow up Falls prevention discussed - - -    FALL RISK PREVENTION PERTAINING TO THE HOME:  Any stairs in or around the home? No  If so, are there any without handrails? No  Home free of loose throw rugs in walkways, pet beds, electrical cords, etc? Yes  Adequate lighting in your home to reduce risk of falls? Yes   ASSISTIVE DEVICES UTILIZED TO PREVENT FALLS:  Life alert? No  Use of a cane, walker or w/c? No  Grab bars in the bathroom? No  Shower chair or bench in shower? No  Elevated toilet seat or a handicapped toilet? No   TIMED UP AND GO:  Was the test performed? Yes .  Length of time to ambulate 10 feet: 8 sec.   Gait steady and fast without use of assistive device  Cognitive Function:     6CIT Screen 07/23/2021  What month? 0 points  What time? 0 points  Count back from 20 0 points  Months in reverse 0 points  Repeat phrase 0 points    Immunizations Immunization History  Administered Date(s) Administered   Fluad Quad(high Dose 65+) 07/03/2021   Influenza Inj Mdck Quad With Preservative 06/25/2017   Influenza,inj,Quad PF,6+ Mos 06/25/2017, 06/14/2018, 04/27/2019   Influenza,inj,quad, With Preservative 06/29/2017   Influenza-Unspecified 07/07/2020   Moderna Sars-Covid-2 Vaccination 11/01/2019, 11/30/2019, 08/22/2020   Pneumococcal Conjugate-13 09/06/2014   Pneumococcal Polysaccharide-23 02/14/2018   Tdap 04/27/2018   Zoster Recombinat (Shingrix) 10/19/2017, 03/02/2018    TDAP status: Up to date  Flu Vaccine status: Up to date  Pneumococcal vaccine status: Up to date  Covid-19 vaccine status: Information provided on how to obtain vaccines.   Qualifies for Shingles Vaccine? Yes   Zostavax completed Yes   Shingrix Completed?:  Yes  Screening Tests Health Maintenance  Topic Date Due   FOOT EXAM  Never done   MAMMOGRAM  03/01/2018   OPHTHALMOLOGY EXAM  05/24/2019   HEMOGLOBIN A1C  08/20/2020   COVID-19 Vaccine (4 - Booster for Moderna series) 10/17/2020   Pneumonia Vaccine 35+ Years old (3 - PPSV23 if available, else PCV20) 02/15/2023   COLONOSCOPY (Pts 45-17yrs Insurance coverage will need to be confirmed)  11/01/2027   TETANUS/TDAP  04/27/2028   INFLUENZA VACCINE  Completed   DEXA SCAN  Completed   Hepatitis C Screening  Completed   Zoster Vaccines- Shingrix  Completed   HPV VACCINES  Aged Out    Health Maintenance  Health Maintenance Due  Topic Date Due   FOOT EXAM  Never done   MAMMOGRAM  03/01/2018   OPHTHALMOLOGY EXAM  05/24/2019   HEMOGLOBIN A1C  08/20/2020   COVID-19 Vaccine (4 - Booster for Moderna series) 10/17/2020    Colorectal cancer screening: Type of screening: Colonoscopy. Completed 10/31/2017. Repeat every 10 years  Mammogram status: No longer required due to total breast reconstruction.  Bone Density status: Ordered 07/23/2021. Pt provided with contact info and advised to call to schedule appt.  Lung Cancer Screening: (Low Dose CT Chest recommended if Age 44-80 years, 30 pack-year currently smoking OR have quit w/in 15years.) does not qualify.  Non smoker.  Additional Screening:  Hepatitis C Screening: does qualify; Completed 05/11/2016  Vision Screening: Recommended annual ophthalmology exams for early detection of glaucoma and other disorders of the eye.  Is the patient up to date with their annual eye exam?  Yes  Who is the provider or what is the name of the office in which the patient attends annual eye exams? Dr. Jorja Loa If pt is not established with a provider, would they like to be referred to a provider to establish care? No .   Dental Screening: Recommended annual dental exams for proper oral hygiene  Community Resource Referral / Chronic Care Management: CRR  required this visit?  No   CCM required this visit?  No      Plan:     I have personally reviewed and noted the following in the patient's chart:   Medical and social history Use of alcohol, tobacco or illicit drugs  Current medications and supplements including opioid prescriptions. Patient is not currently taking opioid prescriptions. Functional ability and status Nutritional status Physical activity Advanced directives List of other physicians Hospitalizations, surgeries, and ER visits in previous 12 months Vitals Screenings to include cognitive, depression, and falls Referrals and appointments  In addition, I have reviewed and discussed with patient certain preventive protocols, quality metrics, and best practice recommendations. A written personalized care plan for preventive services as well as general preventive health recommendations were provided to patient.     Chriss Driver, LPN   [id number]/05/[id number]   Nurse Notes: Up to date on health maintenance and vaccines. Mammograms are no longer required due to total reconstruction after mastectomy. Would like to schedule Dexa for after first of year. Order placed.

## 2021-07-23 NOTE — Patient Instructions (Signed)
Carolyn Glass , Thank you for taking time to come for your Medicare Wellness Visit. I appreciate your ongoing commitment to your health goals. Please review the following plan we discussed and let me know if I can assist you in the future.   Screening recommendations/referrals: Colonoscopy: Done 10/31/2017 Repeat in 10 years  Mammogram: No longer required.  Bone Density: Done 05/31/2018, Order placed to schedule for after first of year.  Recommended yearly ophthalmology/optometry visit for glaucoma screening and checkup Recommended yearly dental visit for hygiene and checkup  Vaccinations: Influenza vaccine: Done 07/03/2021 Repeat in 10 years  Pneumococcal vaccine: Done 09/06/2014 and 02/14/2018 Tdap vaccine: Done 04/27/2018 Shingles vaccine: Done 10/19/2017 and 03/02/2018   Covid-19:Done 11/01/2019, 11/30/2019 and 08/22/2020  Advanced directives: Please bring a copy of your health care power of attorney and living will to the office to be added to your chart at your convenience.   Conditions/risks identified: Aim for 30 minutes of exercise or brisk walking each day, drink 6-8 glasses of water and eat lots of fruits and vegetables.   Next appointment: Follow up in one year for your annual wellness visit 2023.   Preventive Care 39 Years and Older, Female Preventive care refers to lifestyle choices and visits with your health care provider that can promote health and wellness. What does preventive care include? A yearly physical exam. This is also called an annual well check. Dental exams once or twice a year. Routine eye exams. Ask your health care provider how often you should have your eyes checked. Personal lifestyle choices, including: Daily care of your teeth and gums. Regular physical activity. Eating a healthy diet. Avoiding tobacco and drug use. Limiting alcohol use. Practicing safe sex. Taking low-dose aspirin every day. Taking vitamin and mineral supplements as recommended by  your health care provider. What happens during an annual well check? The services and screenings done by your health care provider during your annual well check will depend on your age, overall health, lifestyle risk factors, and family history of disease. Counseling  Your health care provider may ask you questions about your: Alcohol use. Tobacco use. Drug use. Emotional well-being. Home and relationship well-being. Sexual activity. Eating habits. History of falls. Memory and ability to understand (cognition). Work and work Statistician. Reproductive health. Screening  You may have the following tests or measurements: Height, weight, and BMI. Blood pressure. Lipid and cholesterol levels. These may be checked every 5 years, or more frequently if you are over 29 years old. Skin check. Lung cancer screening. You may have this screening every year starting at age 60 if you have a 30-pack-year history of smoking and currently smoke or have quit within the past 15 years. Fecal occult blood test (FOBT) of the stool. You may have this test every year starting at age 34. Flexible sigmoidoscopy or colonoscopy. You may have a sigmoidoscopy every 5 years or a colonoscopy every 10 years starting at age 38. Hepatitis C blood test. Hepatitis B blood test. Sexually transmitted disease (STD) testing. Diabetes screening. This is done by checking your blood sugar (glucose) after you have not eaten for a while (fasting). You may have this done every 1-3 years. Bone density scan. This is done to screen for osteoporosis. You may have this done starting at age 66. Mammogram. This may be done every 1-2 years. Talk to your health care provider about how often you should have regular mammograms. Talk with your health care provider about your test results, treatment options, and if  necessary, the need for more tests. Vaccines  Your health care provider may recommend certain vaccines, such as: Influenza  vaccine. This is recommended every year. Tetanus, diphtheria, and acellular pertussis (Tdap, Td) vaccine. You may need a Td booster every 10 years. Zoster vaccine. You may need this after age 47. Pneumococcal 13-valent conjugate (PCV13) vaccine. One dose is recommended after age 51. Pneumococcal polysaccharide (PPSV23) vaccine. One dose is recommended after age 61. Talk to your health care provider about which screenings and vaccines you need and how often you need them. This information is not intended to replace advice given to you by your health care provider. Make sure you discuss any questions you have with your health care provider. Document Released: 09/19/2015 Document Revised: 05/12/2016 Document Reviewed: 06/24/2015 Elsevier Interactive Patient Education  2017 [employer] Prevention in the Home Falls can cause injuries. They can happen to people of all ages. There are many things you can do to make your home safe and to help prevent falls. What can I do on the outside of my home? Regularly fix the edges of walkways and driveways and fix any cracks. Remove anything that might make you trip as you walk through a door, such as a raised step or threshold. Trim any bushes or trees on the path to your home. Use bright outdoor lighting. Clear any walking paths of anything that might make someone trip, such as rocks or tools. Regularly check to see if handrails are loose or broken. Make sure that both sides of any steps have handrails. Any raised decks and porches should have guardrails on the edges. Have any leaves, snow, or ice cleared regularly. Use sand or salt on walking paths during winter. Clean up any spills in your garage right away. This includes oil or grease spills. What can I do in the bathroom? Use night lights. Install grab bars by the toilet and in the tub and shower. Do not use towel bars as grab bars. Use non-skid mats or decals in the tub or shower. If you  need to sit down in the shower, use a plastic, non-slip stool. Keep the floor dry. Clean up any water that spills on the floor as soon as it happens. Remove soap buildup in the tub or shower regularly. Attach bath mats securely with double-sided non-slip rug tape. Do not have throw rugs and other things on the floor that can make you trip. What can I do in the bedroom? Use night lights. Make sure that you have a light by your bed that is easy to reach. Do not use any sheets or blankets that are too big for your bed. They should not hang down onto the floor. Have a firm chair that has side arms. You can use this for support while you get dressed. Do not have throw rugs and other things on the floor that can make you trip. What can I do in the kitchen? Clean up any spills right away. Avoid walking on wet floors. Keep items that you use a lot in easy-to-reach places. If you need to reach something above you, use a strong step stool that has a grab bar. Keep electrical cords out of the way. Do not use floor polish or wax that makes floors slippery. If you must use wax, use non-skid floor wax. Do not have throw rugs and other things on the floor that can make you trip. What can I do with my stairs? Do not leave any items  on the stairs. Make sure that there are handrails on both sides of the stairs and use them. Fix handrails that are broken or loose. Make sure that handrails are as long as the stairways. Check any carpeting to make sure that it is firmly attached to the stairs. Fix any carpet that is loose or worn. Avoid having throw rugs at the top or bottom of the stairs. If you do have throw rugs, attach them to the floor with carpet tape. Make sure that you have a light switch at the top of the stairs and the bottom of the stairs. If you do not have them, ask someone to add them for you. What else can I do to help prevent falls? Wear shoes that: Do not have high heels. Have rubber  bottoms. Are comfortable and fit you well. Are closed at the toe. Do not wear sandals. If you use a stepladder: Make sure that it is fully opened. Do not climb a closed stepladder. Make sure that both sides of the stepladder are locked into place. Ask someone to hold it for you, if possible. Clearly mark and make sure that you can see: Any grab bars or handrails. First and last steps. Where the edge of each step is. Use tools that help you move around (mobility aids) if they are needed. These include: Canes. Walkers. Scooters. Crutches. Turn on the lights when you go into a dark area. Replace any light bulbs as soon as they burn out. Set up your furniture so you have a clear path. Avoid moving your furniture around. If any of your floors are uneven, fix them. If there are any pets around you, be aware of where they are. Review your medicines with your doctor. Some medicines can make you feel dizzy. This can increase your chance of falling. Ask your doctor what other things that you can do to help prevent falls. This information is not intended to replace advice given to you by your health care provider. Make sure you discuss any questions you have with your health care provider. Document Released: 06/19/2009 Document Revised: 01/29/2016 Document Reviewed: 09/27/2014 Elsevier Interactive Patient Education  2017 [employer].

## 2021-07-24 LAB — CBC WITH DIFFERENTIAL/PLATELET
Absolute Monocytes: 419 cells/uL (ref 200–950)
Basophils Absolute: 41 cells/uL (ref 0–200)
Basophils Relative: 0.9 %
Eosinophils Absolute: 276 cells/uL (ref 15–500)
Eosinophils Relative: 6 %
HCT: 40.1 % (ref 35.0–45.0)
Hemoglobin: 13.3 g/dL (ref 11.7–15.5)
Lymphs Abs: 2107 cells/uL (ref 850–3900)
MCH: 31.6 pg (ref 27.0–33.0)
MCHC: 33.2 g/dL (ref 32.0–36.0)
MCV: 95.2 fL (ref 80.0–100.0)
MPV: 10.8 fL (ref 7.5–12.5)
Monocytes Relative: 9.1 %
Neutro Abs: 1757 cells/uL (ref 1500–7800)
Neutrophils Relative %: 38.2 %
Platelets: 265 10*3/uL (ref 140–400)
RBC: 4.21 10*6/uL (ref 3.80–5.10)
RDW: 12.6 % (ref 11.0–15.0)
Total Lymphocyte: 45.8 %
WBC: 4.6 10*3/uL (ref 3.8–10.8)

## 2021-07-24 LAB — COMPLETE METABOLIC PANEL WITH GFR
AG Ratio: 1.6 (calc) (ref 1.0–2.5)
ALT: 31 U/L — ABNORMAL HIGH (ref 6–29)
AST: 24 U/L (ref 10–35)
Albumin: 4.1 g/dL (ref 3.6–5.1)
Alkaline phosphatase (APISO): 64 U/L (ref 37–153)
BUN: 15 mg/dL (ref 7–25)
CO2: 27 mmol/L (ref 20–32)
Calcium: 9.2 mg/dL (ref 8.6–10.4)
Chloride: 102 mmol/L (ref 98–110)
Creat: 0.63 mg/dL (ref 0.50–1.05)
Globulin: 2.5 g/dL (calc) (ref 1.9–3.7)
Glucose, Bld: 143 mg/dL — ABNORMAL HIGH (ref 65–99)
Potassium: 4.4 mmol/L (ref 3.5–5.3)
Sodium: 140 mmol/L (ref 135–146)
Total Bilirubin: 0.4 mg/dL (ref 0.2–1.2)
Total Protein: 6.6 g/dL (ref 6.1–8.1)
eGFR: 98 mL/min/{1.73_m2} (ref >=60)

## 2021-07-24 LAB — LIPID PANEL
Cholesterol: 160 mg/dL (ref <200)
HDL: 41 mg/dL — ABNORMAL LOW (ref >=50)
LDL Cholesterol (Calc): 96 mg/dL (calc)
Non-HDL Cholesterol (Calc): 119 mg/dL (calc) (ref <130)
Total CHOL/HDL Ratio: 3.9 (calc) (ref <5.0)
Triglycerides: 130 mg/dL (ref <150)

## 2021-07-24 LAB — VITAMIN D 25 HYDROXY (VIT D DEFICIENCY, FRACTURES): Vit D, 25-Hydroxy: 37 ng/mL (ref 30–100)

## 2021-07-24 LAB — MICROALBUMIN / CREATININE URINE RATIO
Creatinine, Urine: 80 mg/dL (ref 20–275)
Microalb Creat Ratio: 9 mcg/mg creat (ref <30)
Microalb, Ur: 0.7 mg/dL

## 2021-07-24 LAB — HEMOGLOBIN A1C
Hgb A1c MFr Bld: 6.7 % of total Hgb — ABNORMAL HIGH (ref <5.7)
Mean Plasma Glucose: 146 mg/dL
eAG (mmol/L): 8.1 mmol/L

## 2021-07-24 LAB — VITAMIN B12: Vitamin B-12: 383 pg/mL (ref 200–1100)

## 2021-07-29 DIAGNOSIS — Z9013 Acquired absence of bilateral breasts and nipples: Secondary | ICD-10-CM | POA: Diagnosis not present

## 2021-07-29 DIAGNOSIS — Z853 Personal history of malignant neoplasm of breast: Secondary | ICD-10-CM | POA: Diagnosis not present

## 2021-08-13 ENCOUNTER — Other Ambulatory Visit: Payer: Self-pay | Admitting: Family Medicine

## 2021-08-17 ENCOUNTER — Ambulatory Visit: Payer: Medicare PPO | Admitting: Family Medicine

## 2021-08-21 ENCOUNTER — Encounter: Payer: Self-pay | Admitting: Family Medicine

## 2021-08-21 ENCOUNTER — Ambulatory Visit: Payer: Medicare PPO | Admitting: Family Medicine

## 2021-08-21 ENCOUNTER — Other Ambulatory Visit: Payer: Self-pay

## 2021-08-21 VITALS — BP 112/78 | HR 86 | Resp 18 | Ht 63.0 in | Wt 166.0 lb

## 2021-08-21 DIAGNOSIS — M25572 Pain in left ankle and joints of left foot: Secondary | ICD-10-CM

## 2021-08-21 DIAGNOSIS — E119 Type 2 diabetes mellitus without complications: Secondary | ICD-10-CM

## 2021-08-21 MED ORDER — CELECOXIB 200 MG PO CAPS: 200.0000 mg | ORAL_CAPSULE | Freq: Two times a day (BID) | ORAL | 1 refills | Status: DC

## 2021-08-21 NOTE — Progress Notes (Signed)
Subjective:    Patient ID: Carolyn Glass, female    DOB: 30-Jun-1956, 65 y.o.   MRN: [id number] Patient is a very pleasant 65 year old Caucasian female who presents today with several months of lateral ankle pain on her left ankle.  The pain is located behind the lateral malleolus.  She states it is a stabbing pain that occurs occasionally.  It does not happen every day.  She denies any neuropathic pain in her feet.  She has excellent pulses in both feet no evidence of peripheral vascular disease.  There is no crepitus on range of motion.  She has negative anterior drawer sign.  There is no tenderness to palpation around the Achilles tendon.  There is no tenderness to palpation of the calcaneus.  Pain seems to be localized to the ankle joint.  She denies any falls or injuries.  Her most recent lab work is listed below.  Her A1c is acceptable at 6.7.  Her LDL cholesterol is excellent less than 100 and her CMP shows normal kidney function and liver function test. Lab on 07/23/2021  Component Date Value Ref Range Status   WBC 07/23/2021 4.6  3.8 - 10.8 Thousand/uL Final   RBC 07/23/2021 4.21  3.80 - 5.10 Million/uL Final   Hemoglobin 07/23/2021 13.3  11.7 - 15.5 g/dL Final   HCT 07/23/2021 40.1  35.0 - 45.0 % Final   MCV 07/23/2021 95.2  80.0 - 100.0 fL Final   MCH 07/23/2021 31.6  27.0 - 33.0 pg Final   MCHC 07/23/2021 33.2  32.0 - 36.0 g/dL Final   RDW 07/23/2021 12.6  11.0 - 15.0 % Final   Platelets 07/23/2021 265  140 - 400 Thousand/uL Final   MPV 07/23/2021 10.8  7.5 - 12.5 fL Final   Neutro Abs 07/23/2021 1,757  1,500 - 7,800 cells/uL Final   Lymphs Abs 07/23/2021 2,107  850 - 3,900 cells/uL Final   Absolute Monocytes 07/23/2021 419  200 - 950 cells/uL Final   Eosinophils Absolute 07/23/2021 276  15 - 500 cells/uL Final   Basophils Absolute 07/23/2021 41  0 - 200 cells/uL Final   Neutrophils Relative % 07/23/2021 38.2  % Final   Total Lymphocyte 07/23/2021 45.8  % Final    Monocytes Relative 07/23/2021 9.1  % Final   Eosinophils Relative 07/23/2021 6.0  % Final   Basophils Relative 07/23/2021 0.9  % Final   Glucose, Bld 07/23/2021 143 (H)  65 - 99 mg/dL Final   Comment: .            Fasting reference interval . For someone without known diabetes, a glucose value >125 mg/dL indicates that they may have diabetes and this should be confirmed with a follow-up test. .    BUN 07/23/2021 15  7 - 25 mg/dL Final   Creat 07/23/2021 0.63  0.50 - 1.05 mg/dL Final   eGFR 07/23/2021 98  > OR = 60 mL/min/1.19m Final   Comment: The eGFR is based on the CKD-EPI 2021 equation. To calculate  the new eGFR from a previous Creatinine or Cystatin C result, go to https://www.kidney.org/professionals/ kdoqi/gfr%5Fcalculator    BUN/Creatinine Ratio 182/50/0370NOT APPLICABLE  6 - 22 (calc) Final   Sodium 07/23/2021 140  135 - 146 mmol/L Final   Potassium 07/23/2021 4.4  3.5 - 5.3 mmol/L Final   Chloride 07/23/2021 102  98 - 110 mmol/L Final   CO2 07/23/2021 27  20 - 32 mmol/L Final   Calcium 07/23/2021 9.2  8.6 -  10.4 mg/dL Final   Total Protein 07/23/2021 6.6  6.1 - 8.1 g/dL Final   Albumin 07/23/2021 4.1  3.6 - 5.1 g/dL Final   Globulin 07/23/2021 2.5  1.9 - 3.7 g/dL (calc) Final   AG Ratio 07/23/2021 1.6  1.0 - 2.5 (calc) Final   Total Bilirubin 07/23/2021 0.4  0.2 - 1.2 mg/dL Final   Alkaline phosphatase (APISO) 07/23/2021 64  37 - 153 U/L Final   AST 07/23/2021 24  10 - 35 U/L Final   ALT 07/23/2021 31 (H)  6 - 29 U/L Final   Hgb A1c MFr Bld 07/23/2021 6.7 (H)  <5.7 % of total Hgb Final   Comment: For someone without known diabetes, a hemoglobin A1c value of 6.5% or greater indicates that they may have  diabetes and this should be confirmed with a follow-up  test. . For someone with known diabetes, a value <7% indicates  that their diabetes is well controlled and a value  greater than or equal to 7% indicates suboptimal  control. A1c  targets should be individualized based on  duration of diabetes, age, comorbid conditions, and  other considerations. . Currently, no consensus exists regarding use of hemoglobin A1c for diagnosis of diabetes for children. .    Mean Plasma Glucose 07/23/2021 146  mg/dL Final   eAG (mmol/L) 07/23/2021 8.1  mmol/L Final   Cholesterol 07/23/2021 160  <200 mg/dL Final   HDL 07/23/2021 41 (L)  > OR = 50 mg/dL Final   Triglycerides 07/23/2021 130  <150 mg/dL Final   LDL Cholesterol (Calc) 07/23/2021 96  mg/dL (calc) Final   Comment: Reference range: <100 . Desirable range <100 mg/dL for primary prevention;   <70 mg/dL for patients with CHD or diabetic patients  with > or = 2 CHD risk factors. Marland Kitchen LDL-C is now calculated using the Martin-Hopkins  calculation, which is a validated novel method providing  better accuracy than the Friedewald equation in the  estimation of LDL-C.  Cresenciano Genre et al. Annamaria Helling. 7893;810(17): 2061-2068  (http://education.[facility].com/faq/FAQ164)    Total CHOL/HDL Ratio 07/23/2021 3.9  <5.0 (calc) Final   Non-HDL Cholesterol (Calc) 07/23/2021 119  <130 mg/dL (calc) Final   Comment: For patients with diabetes plus 1 major ASCVD risk  factor, treating to a non-HDL-C goal of <100 mg/dL  (LDL-C of <70 mg/dL) is considered a therapeutic  option.    Creatinine, Urine 07/23/2021 80  20 - 275 mg/dL Final   Microalb, Ur 07/23/2021 0.7  mg/dL Final   Comment: Reference Range Not established    Microalb Creat Ratio 07/23/2021 9  <30 mcg/mg creat Final   Comment: . The ADA defines abnormalities in albumin excretion as follows: Marland Kitchen Albuminuria Category        Result (mcg/mg creatinine) . Normal to Mildly increased   <30 Moderately increased         30-299  Severely increased           > OR = 300 . The ADA recommends that at least two of three specimens collected within a 3-6 month period be abnormal before considering a patient to be within a  diagnostic category.    Vit D, 25-Hydroxy 07/23/2021 37  30 - 100 ng/mL Final   Comment: Vitamin D Status         25-OH Vitamin D: . Deficiency:                    <20 ng/mL Insufficiency:  20 - 29 ng/mL Optimal:                 > or = 30 ng/mL . For 25-OH Vitamin D testing on patients on  D2-supplementation and patients for whom quantitation  of D2 and D3 fractions is required, the [facility]ureD(TM) 25-OH VIT D, (D2,D3), LC/MS/MS is recommended: order  code [id number] (patients >41yr). See Note 1 . Note 1 . For additional information, please refer to  http://education.[facility].com/faq/FAQ199  (This link is being provided for informational/ educational purposes only.)    Vitamin B-12 07/23/2021 383  200 - 1,100 pg/mL Final   Comment: . Please Note: Although the reference range for vitamin B12 is [id number] pg/mL, it has been reported that between 5 and 10% of patients with values between 200 and 400 pg/mL may experience neuropsychiatric and hematologic abnormalities due to occult B12 deficiency; less than 1% of patients with values above 400 pg/mL will have symptoms. .     Past Medical History:  Diagnosis Date   Anxiety    Arthritis    OA hips   Asthma    allergy and exercise induced asthma    Depression    [place]    Family history of breast cancer    Family history of colon cancer    Family history of stomach cancer    GERD (gastroesophageal reflux disease)    Hypertension    [place]alignant ne[place] of lower-outer quadrant of right [place] 06[place]   Seasonal allergies    Past Surgical History:  Procedure Laterality Date   BREAST LUMPECTOMY     right    BREAST RECONSTRUCTION WITH PLACEMENT OF TISSUE EXPANDER AND FLEX HD (ACELLULAR HYDRATED DERMIS) Bilateral 08/13/2016   Procedure: BREAST RECONSTRUCTION WITH PLACEMENT OF TISSUE EXPANDER AND ALLODERM;  Surgeon: BIrene Limbo MD;   [place] [facility]  Service: Plastics;  Laterality: Bilateral;   COLONOSCOPY N/A 10/31/2017   Procedure: COLONOSCOPY;  Surgeon: FDanie Binder MD;  [facility];  Service: Endoscopy;  Laterality: N/A;  8:30 Am   DILATION AND CURETTAGE OF UTERUS     JOINT REPLACEMENT     LIPOSUCTION WITH LIPOFILLING Bilateral 11/05/2016   Procedure: LIPOFILLING FROM ABDOMEN TO BILATERAL CHEST;  Surgeon: BIrene Limbo MD;  [place] [facility]  Service: Plastics;  Laterality: Bilateral;   MASTECTOMY W/ SENTINEL NODE BIOPSY Bilateral 08/13/2016   Procedure: BILATERAL TOTAL MASTECTOMY WITH RIGHT AXILLARY SENTINEL LYMPH NODE BIOPSY;  Surgeon: BExcell Seltzer MD;  [facility]  Service: General;  Laterality: Bilateral;   REMOVAL OF BILATERAL TISSUE EXPANDERS WITH PLACEMENT OF BILATERAL BREAST IMPLANTS Bilateral 11/05/2016   Procedure: REMOVAL OF BILATERAL TISSUE EXPANDERS WITH PLACEMENT OF BILATERAL BREAST SALINE IMPLANTS;  Surgeon: BIrene Limbo MD;  [facility]  Service: Plastics;  Laterality: Bilateral;   TOTAL HIP ARTHROPLASTY Right 01/07/2016   Procedure: TOTAL HIP ARTHROPLASTY ANTERIOR APPROACH;  Surgeon: FGaynelle Arabian MD;  [facility];  Service: Orthopedics;  Laterality: Right;   TOTAL HIP ARTHROPLASTY Left 03/22/2018   Procedure: LEFT TOTAL HIP ARTHROPLASTY ANTERIOR APPROACH;  Surgeon: AGaynelle Arabian MD;  Location[facility];  Service: Orthopedics;  Laterality: Left;   Current Outpatient Medications on File Prior to Visit  Medication Sig Dispense Refill   benazepril (LOTENSIN) 20 MG tablet TAKE (1) TABLET BY MOUTH TWICE DAILY. 180 tablet 0   escitalopram (LEXAPRO) 20 MG tablet TAKE ONE TABLET BY MOUTH ONCE DAILY. 90 tablet  0   fexofenadine (ALLEGRA) 60 MG tablet Take 60 mg by mouth 2 (two) times daily.     JANUVIA 100 MG tablet TAKE (1) TABLET BY MOUTH ONCE DAILY. 90 tablet 3   metFORMIN  (GLUCOPHAGE) 1000 MG tablet TAKE 1 TABLET BY MOUTH TWICE DAILY WITH MEALS. 180 tablet 0   omeprazole (PRILOSEC) 20 MG capsule TAKE (1) CAPSULE BY MOUTH ONCE DAILY. 90 capsule 0   tamoxifen (NOLVADEX) 20 MG tablet Take 1 tablet (20 mg total) by mouth daily. 90 tablet 1   No current facility-administered medications on file prior to visit.   Allergies  Allergen Reactions   Corn-Containing Products Itching    Running nose   Tomato Itching    Running nose    Azithromycin Itching and Rash   Biaxin [Clarithromycin] Rash   Codeine Rash    Thinks a codeine cough syrup caused rash.   Social History   Socioeconomic History   Marital status: Married    Spouse name: [name]   Number of children: 1   Years of education: Not on file   Highest education level: Not on file  Occupational History   Not on file  Tobacco Use   Smoking status: Never   Smokeless tobacco: Never  Vaping Use   Vaping Use: Never used  Substance and Sexual Activity   Alcohol use: No   Drug use: No   Sexual activity: Yes  Other Topics Concern   Not on file  Social History Narrative   Married x 37 years in 2022.   1 daughter [name]   1 granddaughter, Film/video editor   Social Determinants of Radio broadcast assistant Strain: Low Risk    Difficulty of Paying Living Expenses: Not hard at all  Food Insecurity: No Food Insecurity   Worried About Charity fundraiser in the Last Year: Never true   Arboriculturist in the Last Year: Never true  Transportation Needs: No Transportation Needs   Lack of Transportation (Medical): No   Lack of Transportation (Non-Medical): No  Physical Activity: Insufficiently Active   Days of Exercise per Week: 3 days   Minutes of Exercise per Session: 20 min  Stress: No Stress Concern Present   Feeling of Stress : Only a little  Social Connections: Engineer, building services of Communication with Friends and Family: More than three times a week   Frequency  of Social Gatherings with Friends and Family: More than three times a week   Attends Religious Services: More than 4 times per year   Active Member of [employer] or Organizations: Yes   Attends Music therapist: More than 4 times per year   Marital Status: Married  Human resources officer Violence: Not At Risk   Fear of Current or Ex-Partner: No   Emotionally Abused: No   Physically Abused: No   Sexually Abused: No   Family History  Problem Relation Age of Onset   Arthritis Mother    Asthma Mother    Diabetes Mother    Hyperlipidemia Mother    Hypertension Mother    Arthritis Father    Asthma Father    Stroke Father    Hyperlipidemia Father    Early death Brother 8       Muscular Dystrophy   Birth defects Brother    Cancer Maternal Uncle        possibly stomach   Mental retardation Maternal Uncle    Cancer Paternal Grandmother 59  breast cancer    Cancer Cousin 78       paternal cousin's daughter with breast cancer   Colon cancer Maternal Aunt        dx in her 77s   Leukemia Paternal Uncle    Lymphoma Paternal Uncle    Down syndrome Paternal Uncle       Review of Systems  All other systems reviewed and are negative.     Objective:   Physical Exam Vitals reviewed.  Constitutional:      General: She is not in acute distress.    Appearance: Normal appearance. She is well-developed. She is not ill-appearing, toxic-appearing or diaphoretic.  Cardiovascular:     Rate and Rhythm: Normal rate and regular rhythm.     Heart sounds: Normal heart sounds. No murmur heard.   No friction rub. No gallop.  Pulmonary:     Effort: Pulmonary effort is normal. No respiratory distress.     Breath sounds: Normal breath sounds. No wheezing or rales.  Chest:     Chest wall: No tenderness.  Musculoskeletal:        General: No tenderness or deformity. Normal range of motion.     Cervical back: Normal range of motion and neck supple.     Left ankle:  No deformity. No tenderness. Normal range of motion. Anterior drawer test negative. Normal pulse.     Right foot: Normal. No swelling or deformity.     Left foot: Normal. No swelling, deformity, tenderness or bony tenderness.  Skin:    General: Skin is warm.     Coloration: Skin is not pale.     Findings: No erythema or rash.  Neurological:     Mental Status: She is alert.          Assessment & Plan:  Acute left ankle pain - Plan: DG Ankle Complete Left  Controlled type 2 diabetes mellitus without complication, without long-term current use of insulin ([place]) I believe the ankle pain is likely due to osteoarthritis.  Begin Celebrex 200 mg twice daily as needed.  Obtain x-rays of the left ankle to evaluate further.  I reviewed all of her fasting lab work from earlier this year which was outstanding.  I encouraged her to make changes in her diet and decrease carb and try to drop her A1c below 6.5.  Blood pressure and cholesterol are excellent.

## 2021-08-24 ENCOUNTER — Ambulatory Visit (HOSPITAL_COMMUNITY)
Admission: RE | Admit: 2021-08-24 | Discharge: 2021-08-24 | Disposition: A | Discharge status: Home / Self Care | Payer: Medicare PPO | Source: Ambulatory Visit | Attending: Family Medicine | Admitting: Family Medicine

## 2021-08-24 ENCOUNTER — Other Ambulatory Visit: Payer: Self-pay

## 2021-08-24 DIAGNOSIS — M25572 Pain in left ankle and joints of left foot: Secondary | ICD-10-CM | POA: Insufficient documentation

## 2021-08-24 DIAGNOSIS — M7732 Calcaneal spur, left foot: Secondary | ICD-10-CM | POA: Diagnosis not present

## 2021-09-16 ENCOUNTER — Other Ambulatory Visit: Payer: Self-pay

## 2021-09-16 ENCOUNTER — Ambulatory Visit: Payer: Medicare PPO | Admitting: Nurse Practitioner

## 2021-09-16 ENCOUNTER — Encounter: Payer: Self-pay | Admitting: Nurse Practitioner

## 2021-09-16 VITALS — BP 134/74 | HR 95 | Ht 63.0 in | Wt 171.0 lb

## 2021-09-16 DIAGNOSIS — S161XXA Strain of muscle, fascia and tendon at neck level, initial encounter: Secondary | ICD-10-CM | POA: Diagnosis not present

## 2021-09-16 MED ORDER — TIZANIDINE HCL 4 MG PO TABS
4.0000 mg | ORAL_TABLET | Freq: Four times a day (QID) | ORAL | 0 refills | Status: DC | PRN
Start: 2021-09-16 — End: 2022-08-27

## 2021-09-16 NOTE — Progress Notes (Signed)
Subjective:    Patient ID: Carolyn Glass, female    DOB: 07/05/1956, 66 y.o.   MRN: [id number]  HPI: Carolyn Glass is a 66 y.o. female presenting for neck and shoulder pain.  Chief Complaint  Patient presents with   Neck Pain   NECK PAIN Patient reports she first noticed pain about 1 week ago.  She reports she has been using her arms more than normal lately; has a new grand child that she has been holding a feeding.  Also has horses and was recently pulled by the horse.  She regularly lifts feed bags that are very heavy.  She also knits.  At night time, she plays on her phone and scrolls and feels this also exacerbates the pain.  Onset: 1 week ago Location: left side of upper back, over to right side Duration: weeks Severity: moderate to severe Quality: dull ache Frequency: constant, intermittent  Radiation: none Aggravating factors: moving head, lifting, holding weight, Alleviating factors: Tylenol, heating pad, cyclobenzaprine Status: stable Treatments attempted: heating pad, Tylenol, cyclobenzaprine Relief with NSAIDs?:  mild Weakness:  no Paresthesias / decreased sensation:  no  Fevers:  no  Allergies  Allergen Reactions   Corn-Containing Products Itching    Running nose   Tomato Itching    Running nose    Azithromycin Itching and Rash   Biaxin [Clarithromycin] Rash   Codeine Rash    Thinks a codeine cough syrup caused rash.    Outpatient Encounter Medications as of 09/16/2021  Medication Sig   benazepril (LOTENSIN) 20 MG tablet TAKE (1) TABLET BY MOUTH TWICE DAILY.   celecoxib (CELEBREX) 200 MG capsule Take 1 capsule (200 mg total) by mouth 2 (two) times daily.   escitalopram (LEXAPRO) 20 MG tablet TAKE ONE TABLET BY MOUTH ONCE DAILY.   fexofenadine (ALLEGRA) 60 MG tablet Take 60 mg by mouth 2 (two) times daily.   JANUVIA 100 MG tablet TAKE (1) TABLET BY MOUTH ONCE DAILY.   metFORMIN (GLUCOPHAGE) 1000 MG tablet TAKE 1 TABLET BY MOUTH TWICE DAILY WITH MEALS.    omeprazole (PRILOSEC) 20 MG capsule TAKE (1) CAPSULE BY MOUTH ONCE DAILY.   tamoxifen (NOLVADEX) 20 MG tablet Take 1 tablet (20 mg total) by mouth daily.   tiZANidine (ZANAFLEX) 4 MG tablet Take 1 tablet (4 mg total) by mouth every 6 (six) hours as needed for muscle spasms. Do not take while driving or operating heavy machinery.   No facility-administered encounter medications on file as of 09/16/2021.    Patient Active Problem List   Diagnosis Date Noted   History of revision of total replacement of left hip joint 08/11/2020   Special screening for malignant neoplasms, colon    Osteopenia 11/16/2016   Genetic testing 07/09/2016   Family history of breast cancer    Family history of colon cancer    Family history of stomach cancer    Malignant neoplasm of lower-outer quadrant of right female breast ([place]) 06/21/2016   OA (osteoarthritis) of hip 01/07/2016    Past Medical History:  Diagnosis Date   Anxiety    Arthritis    OA hips   Asthma    allergy and exercise induced asthma    Depression    Diabetes mellitus without complication ([place])    Family history of breast cancer    Family history of colon cancer    Family history of stomach cancer    GERD (gastroesophageal reflux disease)    Hypertension    Malignant  neoplasm of lower-outer quadrant of right female breast ([place]) 06/21/2016   Seasonal allergies     Relevant past medical, surgical, family and social history reviewed and updated as indicated. Interim medical history since our last visit reviewed.  Review of Systems Per HPI unless specifically indicated above     Objective:    BP 134/74    Pulse 95    Ht 5\' 3"  (1.6 m)    Wt 171 lb (77.6 kg)    SpO2 97%    BMI 30.29 kg/m   Wt Readings from Last 3 Encounters:  09/16/21 171 lb (77.6 kg)  08/21/21 166 lb (75.3 kg)  07/23/21 166 lb (75.3 kg)    Physical Exam Vitals and nursing note reviewed.  Constitutional:      General: She is not in acute distress.     Appearance: Normal appearance. She is not toxic-appearing.  Neck:      Comments: Tenderness to palpation in area marked Musculoskeletal:     Cervical back: Normal range of motion. Tenderness present. No rigidity. Muscular tenderness present. Normal range of motion.  Lymphadenopathy:     Cervical: No cervical adenopathy.  Skin:    General: Skin is warm and dry.     Coloration: Skin is not jaundiced or pale.     Findings: No erythema.  Neurological:     Mental Status: She is alert and oriented to person, place, and time.  Psychiatric:        Mood and Affect: Mood normal.        Behavior: Behavior normal.        Thought Content: Thought content normal.        Judgment: Judgment normal.      Assessment & Plan:  1. Strain of neck muscle, initial encounter Acute.  Discussed regular stretches/exercises, heat, massage/chiropractor. use of celebrex/Tylenol alternating, will start trial of Zanaflex.  No red flags in history or on examination today.  If no better in a couple of weeks, we can consider referral to PT.  Follow up if symptoms worsening or with pain radiating to fingertips.   - tiZANidine (ZANAFLEX) 4 MG tablet; Take 1 tablet (4 mg total) by mouth every 6 (six) hours as needed for muscle spasms. Do not take while driving or operating heavy machinery.  Dispense: 30 tablet; Refill: 0    Follow up plan: Return if symptoms worsen or fail to improve.

## 2021-09-30 ENCOUNTER — Other Ambulatory Visit: Payer: Self-pay | Admitting: Family Medicine

## 2021-10-15 ENCOUNTER — Other Ambulatory Visit: Payer: Self-pay | Admitting: Family Medicine

## 2021-10-16 ENCOUNTER — Other Ambulatory Visit: Payer: Self-pay | Admitting: Family Medicine

## 2021-10-19 ENCOUNTER — Other Ambulatory Visit: Payer: Self-pay | Admitting: Family Medicine

## 2021-10-19 DIAGNOSIS — K219 Gastro-esophageal reflux disease without esophagitis: Secondary | ICD-10-CM

## 2021-11-17 ENCOUNTER — Other Ambulatory Visit: Payer: Self-pay | Admitting: Family Medicine

## 2021-12-16 ENCOUNTER — Encounter: Payer: Self-pay | Admitting: Hematology

## 2021-12-16 ENCOUNTER — Inpatient Hospital Stay: Payer: Medicare PPO

## 2021-12-16 ENCOUNTER — Other Ambulatory Visit: Payer: Self-pay

## 2021-12-16 ENCOUNTER — Inpatient Hospital Stay: Payer: Medicare PPO | Attending: Hematology | Admitting: Hematology

## 2021-12-16 VITALS — BP 144/72 | HR 86 | Temp 98.5°F | Resp 18 | Ht 63.0 in | Wt 170.1 lb

## 2021-12-16 DIAGNOSIS — I10 Essential (primary) hypertension: Secondary | ICD-10-CM | POA: Diagnosis not present

## 2021-12-16 DIAGNOSIS — E119 Type 2 diabetes mellitus without complications: Secondary | ICD-10-CM | POA: Diagnosis not present

## 2021-12-16 DIAGNOSIS — C50511 Malignant neoplasm of lower-outer quadrant of right female breast: Secondary | ICD-10-CM

## 2021-12-16 DIAGNOSIS — M1990 Unspecified osteoarthritis, unspecified site: Secondary | ICD-10-CM | POA: Diagnosis not present

## 2021-12-16 DIAGNOSIS — M8580 Other specified disorders of bone density and structure, unspecified site: Secondary | ICD-10-CM | POA: Insufficient documentation

## 2021-12-16 DIAGNOSIS — Z170 Estrogen receptor positive status [ER+]: Secondary | ICD-10-CM | POA: Diagnosis not present

## 2021-12-16 LAB — CBC WITH DIFFERENTIAL/PLATELET
Abs Immature Granulocytes: 0.01 10*3/uL (ref 0.00–0.07)
Basophils Absolute: 0 10*3/uL (ref 0.0–0.1)
Basophils Relative: 1 %
Eosinophils Absolute: 0.2 10*3/uL (ref 0.0–0.5)
Eosinophils Relative: 4 %
HCT: 39.4 % (ref 36.0–46.0)
Hemoglobin: 13.5 g/dL (ref 12.0–15.0)
Immature Granulocytes: 0 %
Lymphocytes Relative: 37 %
Lymphs Abs: 1.5 10*3/uL (ref 0.7–4.0)
MCH: 31.9 pg (ref 26.0–34.0)
MCHC: 34.3 g/dL (ref 30.0–36.0)
MCV: 93.1 fL (ref 80.0–100.0)
Monocytes Absolute: 0.4 10*3/uL (ref 0.1–1.0)
Monocytes Relative: 9 %
Neutro Abs: 1.9 10*3/uL (ref 1.7–7.7)
Neutrophils Relative %: 49 %
Platelets: 247 10*3/uL (ref 150–400)
RBC: 4.23 MIL/uL (ref 3.87–5.11)
RDW: 13.4 % (ref 11.5–15.5)
WBC: 4 10*3/uL (ref 4.0–10.5)
nRBC: 0 % (ref 0.0–0.2)

## 2021-12-16 LAB — COMPREHENSIVE METABOLIC PANEL
ALT: 48 U/L — ABNORMAL HIGH (ref 0–44)
AST: 45 U/L — ABNORMAL HIGH (ref 15–41)
Albumin: 4.1 g/dL (ref 3.5–5.0)
Alkaline Phosphatase: 58 U/L (ref 38–126)
Anion gap: 7 (ref 5–15)
BUN: 15 mg/dL (ref 8–23)
CO2: 27 mmol/L (ref 22–32)
Calcium: 9.3 mg/dL (ref 8.9–10.3)
Chloride: 105 mmol/L (ref 98–111)
Creatinine, Ser: 0.66 mg/dL (ref 0.44–1.00)
GFR, Estimated: >60 mL/min (ref >60)
Glucose, Bld: 132 mg/dL — ABNORMAL HIGH (ref 70–99)
Potassium: 4.1 mmol/L (ref 3.5–5.1)
Sodium: 139 mmol/L (ref 135–145)
Total Bilirubin: 0.5 mg/dL (ref 0.3–1.2)
Total Protein: 7.1 g/dL (ref 6.5–8.1)

## 2021-12-16 NOTE — Progress Notes (Signed)
?[place]   ?[phone] [phone]   ?Clinic Follow up Note  ? ?Patient Care Team: ?[name], MD as PCP - General (Family Medicine) ?[name], MD (Inactive) as Consulting Physician (General Surgery) ?[name], MD as Consulting Physician (Hematology) ?[name], MD as Attending Physician (Radiation Oncology) ?[name], NP as Nurse Practitioner (Hematology and Oncology) ? ?Date of Service:  12/16/2021 ? ?CHIEF COMPLAINT: f/u of right breast cancer ? ?CURRENT THERAPY:  ?Antiestrogen therapy, started 09/25/16, currently tamoxifen since 03/19/21 ? ?ASSESSMENT & PLAN:  ?Carolyn Glass is a 66 y.o. post-menopausal female with  ? ?1. Malignant neoplasm of lower-outer quadrant of right female breast, invasive lobular carcinoma and LCIS, mpT1cN0M0, stage IA, G2, ER+/PR+/HER2-, oncotype low risk  ?-She was diagnosed in 06/2016. She is s/p b/l mastectomy and breast reconstruction surgery.  ?-Oncotype DX score was 11 which is at low risk.  ?-She has been on anti-estrogen therapy since 09/2016. Plan for total 7-10 years due to lobular histology. She was on exemestane through 12/2020 and switched to tamoxifen due to AEs in 03/2021. Will continue until 09/2026 if she tolerates well  ?-She is clinically doing well. Labs reviewed, overall WNL. Physical exam was unremarkable. ?-Continue surveillance. No need for mammogram given s/p b/l mastectomy. ?-f/u in 6 months, then annually. ?  ?2. Arthritis  ?-She is s/p b/l hip replacements.  ?-Will monitor and f/u with Orthopedics in interim. ?  ?3. Osteopenia ?-Her 05/31/18 DEXA showed lowest T-score at -1.9, slightly worse than previous scan.  ?-Will continue Vitamin D. She is fine to take Calcium 500-600 units daily as her Calcium level has been normal lately. We reviewed that tamoxifen can help strengthen her bones. ?-she is due for repeat DEXA, which she previously had done at [facility]. I ordered repeat  today. ?  ?4. Cancer Screening, Genetic testing negative ?-Her last colonoscopy in 10/2017 showed beginning stages of diverticulitis. She is due for repeat 2024-2026. ?-she will continue pap every 3 years ?  ?5. DM, type 2 ?-Continue medications and f/u with PCP.  ?  ?  ?PLAN ?-continue tamoxifen ?-DEXA to be done anytime at her GYN office  ?-Lab and f/u with me in 6 months  ? ? ?No problem-specific Assessment & Plan notes found for this encounter. ? ? ?SUMMARY OF ONCOLOGIC HISTORY: ?Oncology History Overview Note  ?Malignant neoplasm of lower-outer quadrant of right female breast ([place]) ?  Staging form: Breast, AJCC 7th Edition ?  - Clinical stage from 06/23/2016: Stage IA (T1c, N0, M0) - Unsigned ? ?  ?Malignant neoplasm of lower-outer quadrant of right female breast [facility] [place])  ?04/28/2015 Imaging  ? Bone Density Report 04/28/2015 ?AP Spine (L1-L4): T-Score = -1.7. Osteopenia ?Femoral Neck (Left) : T-Score = -1.0 Normal ?Femoral Neck (Right): T-Score = 0.4 Normal ?  ?03/01/2016 Mammogram  ? Bilateral screening mammogram was negative ?  ?06/08/2016 Imaging  ? Bilateral breast MRI with and without contrast showed 4 lesions in the right breast, 2 adjacent 1.4 cm mass in the inferior aspect of the right breast, and in the right lateral aspect of the breast, there are 8 mm and a 7 mm irregular enhancing mass. Multiple small enhancing lesions within the liver, indeterminate. No axillary adenopathy. ?  ?06/16/2016 Initial Diagnosis  ? Malignant neoplasm of lower-outer quadrant of right female breast [facility]) ?  ?06/16/2016 Initial Biopsy  ? Right breast needle core biopsy at 9 clock, 6:30 clock, 6 clock showed invasive lobular carcinoma,  and LCIS ?  ?06/16/2016 Receptors her2  ? two right breast biopsy showed ER strongly positive, PR strongly positive, HER-2 negative, Ki-67 5-10% ? ?  ?06/16/2016 Imaging  ? Right breast and axilla ultrasound showed a 1.4 cm mass at the 6:30 o'clock, 1.0 cm mass at the 6:00, and 92m mass at 9:00  position. Axilla was negative. ?  ?06/28/2016 Imaging  ? MRI ABDOMEN W W/O CONTRAST 06/28/2016 ?IMPRESSION: ?1. Multiple hepatic hemangiomas.  No suspicious liver lesion. ?2. Hepatic steatosis. ?3. No evidence of abdominal metastasis. ?  ?06/28/2016 Genetic Testing  ? Patient has genetic testing done for breast cancer. ?Results revealed patient has the following variant called, MSH2 [id number]>T. ?  ?08/13/2016 Surgery  ? Bilateral simple mastectomy and right SLN biopsy  ? ?  ?08/13/2016 Pathology Results  ? Right mastectomy showed invasive ductal carcinoma, G2, multifocal, 1.5cm, 1.5cm, and 1.0cm. (+) LCIS, margins are negative, 1 sentinel lymph node was negative. Left simple mastectomy showed LCIS, ADH, no malignancy. ? ?  ?08/13/2016 Oncotype testing  ? Oncotype recurrence score 11, which predicts 10 year distant recurrence risk of 7% with tamoxifen alone, low risk category. No adjuvant chemo recommended based on this.  ?  ?09/25/2016 -  Anti-estrogen oral therapy  ? Adjuvant exemestane 222mdaily  ?  ?11/05/2016 Surgery  ? REMOVAL OF BILATERAL TISSUE EXPANDERS WITH PLACEMENT OF BILATERAL BREAST SALINE IMPLANTS by Dr. ThIran Planas?  ? ? ? ?INTERVAL HISTORY:  ?[name] here for a follow up of breast cancer. She was last seen by me on 06/17/21. She presents to the clinic alone. ?She reports she is doing well overall. She denies side effects from tamoxifen but notes she has gained weight recently. She also reports a lump in her right axilla, which has previously been biopsied. ?She tells me she has a new grandchild, so she now has one that's 3 21ears old, and the other is 4 18 monthsld. ?  ?All other systems were reviewed with the patient and are negative. ? ?MEDICAL HISTORY:  ?Past Medical History:  ?Diagnosis Date  ? Anxiety   ? Arthritis   ? OA hips  ? Asthma   ? allergy and exercise induced asthma   ? Depression   ? Diabetes [place] complication ([place]  ? Family history of breast cancer   ? Family history  of colon cancer   ? Family history of stomach cancer   ? GERD (gastroesophageal reflux disease)   ? Hypertension   ? Malignant neoplasm of lower-outer quadrant of right female breast ([facility]  ? Seasonal allergies   ? ? ?SURGICAL HISTORY: ?Past Surgical History:  ?Procedure Laterality Date  ? BREAST LUMPECTOMY    ? right   ? BREAST RECONSTRUCTION WITH PLACEMENT OF TISSUE EXPANDER AND FLEX HD (ACELLULAR HYDRATED DERMIS) Bilateral 08/13/2016  ? Procedure: BREAST RECONSTRUCTION WITH PLACEMENT OF TISSUE EXPANDER AND ALLODERM;  Surgeon: BrIrene LimboMD;  [place] Service: Plastics;  Laterality: Bilateral;  ? COLONOSCOPY N/A 10/31/2017  ? Procedure: COLONOSCOPY;  Surgeon: FiDanie BinderMD;  [facility];  Service: Endoscopy;  Laterality: N/A;  8:30 Am  ? DILATION AND CURETTAGE OF UTERUS    ? JOINT REPLACEMENT    ? LIPOSUCTION WITH LIPOFILLING Bilateral 11/05/2016  ? Procedure: LIPOFILLING FROM ABDOMEN TO BILATERAL CHEST;  Surgeon: BrIrene LimboMD;  [place] Service: Plastics;  Laterality: Bilateral;  ? MASTECTOMY W/ SENTINEL NODE BIOPSY Bilateral  08/13/2016  ? Procedure: BILATERAL TOTAL MASTECTOMY WITH RIGHT AXILLARY SENTINEL LYMPH NODE BIOPSY;  Surgeon: [name], MD;  Location: [place];  Service: General;  Laterality: Bilateral;  ? REMOVAL OF BILATERAL TISSUE EXPANDERS WITH PLACEMENT OF BILATERAL BREAST IMPLANTS Bilateral 11/05/2016  ? Procedure: REMOVAL OF BILATERAL TISSUE EXPANDERS WITH PLACEMENT OF BILATERAL BREAST SALINE IMPLANTS;  Surgeon: Irene Limbo, MD;  Location: [place];  Service: Plastics;  Laterality: Bilateral;  ? TOTAL HIP ARTHROPLASTY Right 01/07/2016  ? Procedure: TOTAL HIP ARTHROPLASTY ANTERIOR APPROACH;  Surgeon: Gaynelle Arabian, MD;  [facility];  Service: Orthopedics;  Laterality: Right;  ? TOTAL HIP ARTHROPLASTY Left 03/22/2018  ? Procedure: LEFT TOTAL HIP ARTHROPLASTY  ANTERIOR APPROACH;  Surgeon: Gaynelle Arabian, MD;  [facility];  Service: Orthopedics;  Laterality: Left;  ? ? ?I have reviewed the social history and family history with the patient and they are [place]

## 2022-01-04 ENCOUNTER — Other Ambulatory Visit: Payer: Self-pay | Admitting: Hematology

## 2022-01-14 IMAGING — CR DG ANKLE COMPLETE 3+V*L*
3 series · 3 of 3 positions shown · non-contrast
Comparison: None.

CLINICAL DATA: Lateral malleolar pain for 2 months

EXAM:
LEFT ANKLE COMPLETE - 3+ VIEW

[x ap left]
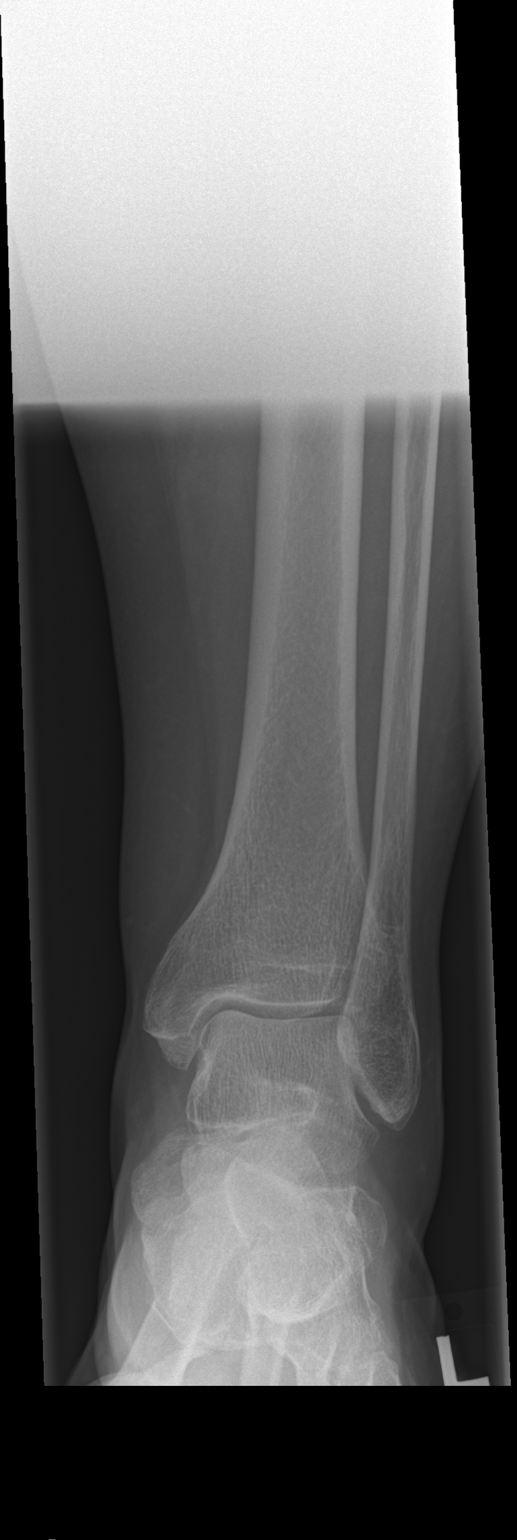

[x medial oblique left]
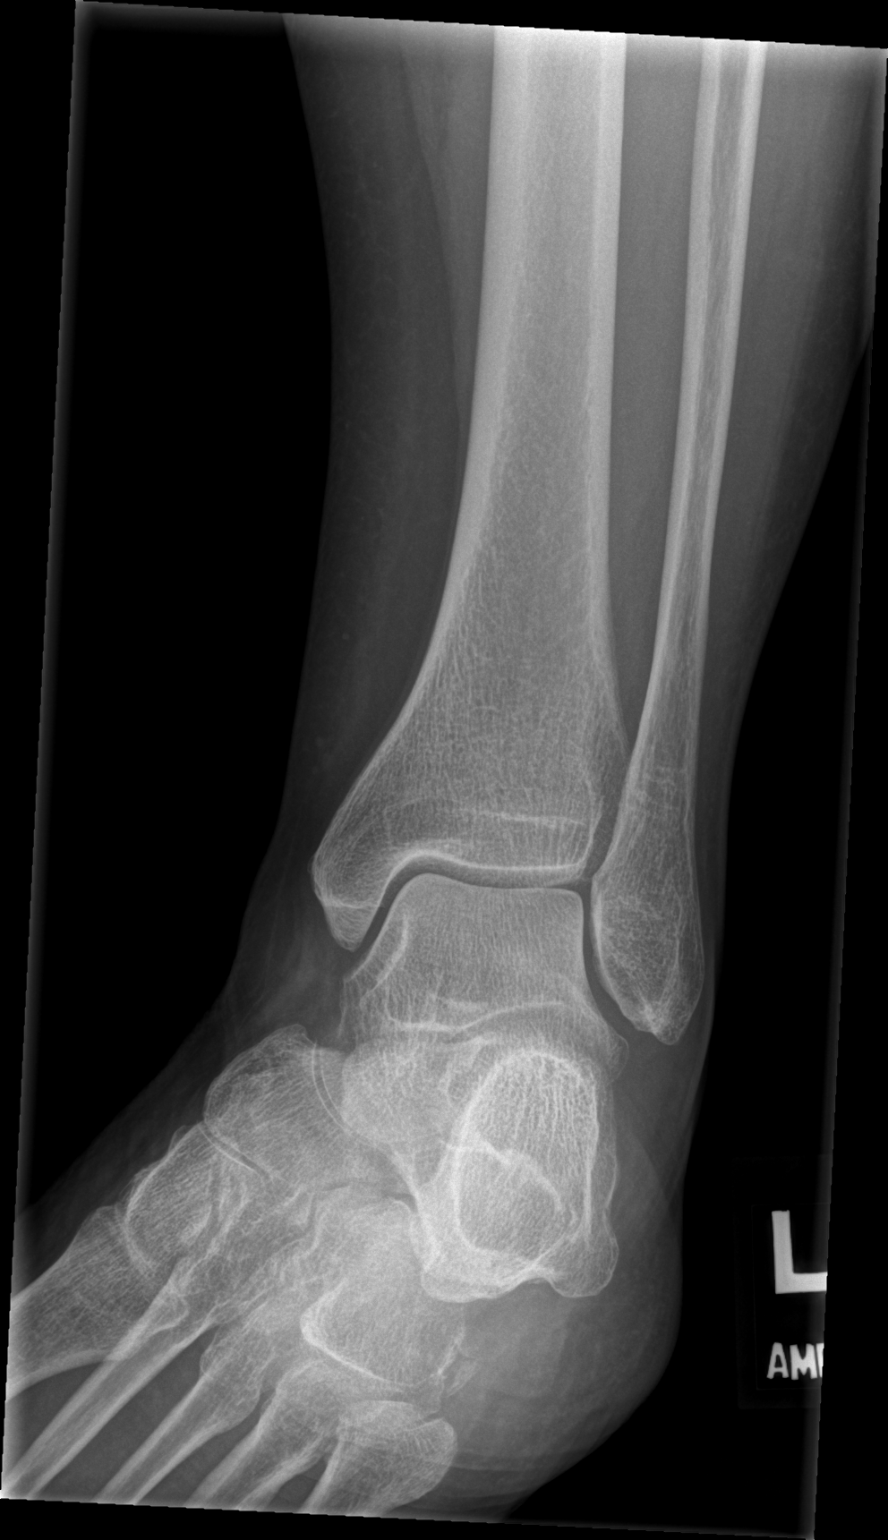

[x lateral left]
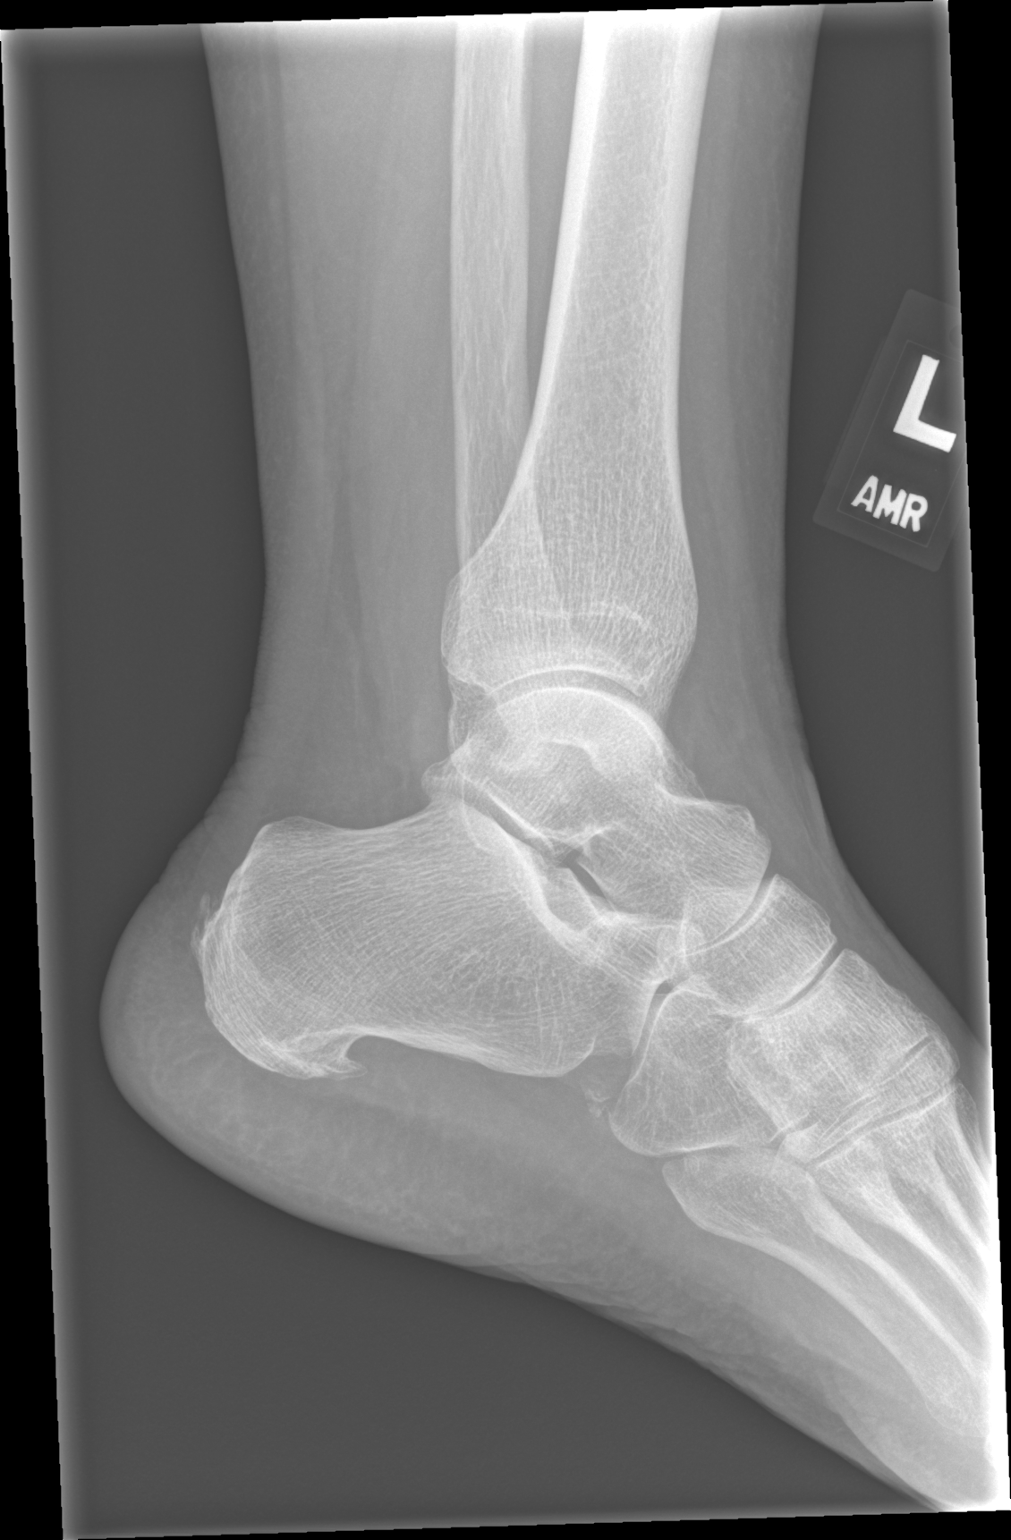

[3 of 3 positions shown; findings below may reference images not displayed]

FINDINGS: Frontal, oblique, lateral views are obtained. No fracture,
subluxation, or dislocation. Ankle mortise is intact. Mild superior
and inferior calcaneal spurs. Joint spaces are well preserved. Soft
tissues are unremarkable.
IMPRESSION: 1. No acute bony abnormality.
2. Mild superior and inferior calcaneal spurs.

## 2022-01-19 ENCOUNTER — Other Ambulatory Visit: Payer: Self-pay | Admitting: Family Medicine

## 2022-01-19 DIAGNOSIS — K219 Gastro-esophageal reflux disease without esophagitis: Secondary | ICD-10-CM

## 2022-01-20 NOTE — Telephone Encounter (Signed)
Requested Prescriptions  ?Pending Prescriptions Disp Refills  ?? celecoxib (CELEBREX) 200 MG capsule [Pharmacy Med Name: CELECOXIB 200 MG CAPSULE] 30 capsule 0  ?  Sig: TAKE (1) CAPSULE BY MOUTH TWICE DAILY.  ?  ? Analgesics:  COX2 Inhibitors Failed - 01/19/2022 10:43 AM  ?  ?  Failed - Manual Review: Labs are only required if the patient has taken medication for more than 8 weeks.  ?  ?  Failed - AST in normal range and within 360 days  ?  AST  ?Date Value Ref Range Status  ?12/16/2021 45 (H) 15 - 41 U/L Final  ?06/17/2021 25 15 - 41 U/L Final  ?06/13/2017 18 5 - 34 U/L Final  ?   ?  ?  Failed - ALT in normal range and within 360 days  ?  ALT  ?Date Value Ref Range Status  ?12/16/2021 48 (H) 0 - 44 U/L Final  ?06/17/2021 25 0 - 44 U/L Final  ?06/13/2017 22 0 - 55 U/L Final  ?   ?  ?  Passed - HGB in normal range and within 360 days  ?  Hemoglobin  ?Date Value Ref Range Status  ?12/16/2021 13.5 12.0 - 15.0 g/dL Final  ? ?HGB  ?Date Value Ref Range Status  ?06/13/2017 12.8 11.6 - 15.9 g/dL Final  ?   ?  ?  Passed - Cr in normal range and within 360 days  ?  Creatinine  ?Date Value Ref Range Status  ?06/13/2017 0.8 0.6 - 1.1 mg/dL Final  ? ?Creat  ?Date Value Ref Range Status  ?07/23/2021 0.63 0.50 - 1.05 mg/dL Final  ? ?Creatinine, Ser  ?Date Value Ref Range Status  ?12/16/2021 0.66 0.44 - 1.00 mg/dL Final  ? ?Creatinine, Urine  ?Date Value Ref Range Status  ?07/23/2021 80 20 - 275 mg/dL Final  ?   ?  ?  Passed - HCT in normal range and within 360 days  ?  HCT  ?Date Value Ref Range Status  ?12/16/2021 39.4 36.0 - 46.0 % Final  ?06/13/2017 38.5 34.8 - 46.6 % Final  ?   ?  ?  Passed - eGFR is 30 or above and within 360 days  ?  GFR, Est African American  ?Date Value Ref Range Status  ?02/19/2020 108 > OR = 60 mL/min/1.109m Final  ? ?GFR, Est Non African American  ?Date Value Ref Range Status  ?02/19/2020 93 > OR = 60 mL/min/1.766mFinal  ? ?GFR, Estimated  ?Date Value Ref Range Status  ?12/16/2021 >60 >60 mL/min Final   ?  Comment:  ?  (NOTE) ?Calculated using the CKD-EPI Creatinine Equation (2021) ?  ?06/17/2021 >60 >60 mL/min Final  ?  Comment:  ?  (NOTE) ?Calculated using the CKD-EPI Creatinine Equation (2021) ?  ? ?eGFR  ?Date Value Ref Range Status  ?07/23/2021 98 > OR = 60 mL/min/1.7341minal  ?  Comment:  ?  The eGFR is based on the CKD-EPI 2021 equation. To calculate  ?the new eGFR from a previous Creatinine or Cystatin C ?result, go to https://www.kidney.org/professionals/ ?kdoqi/gfr%5Fcalculator ?  ?   ?  ?  Passed - Patient is not pregnant  ?  ?  Passed - Valid encounter within last 12 months  ?  Recent Outpatient Visits   ?      ? 4 months ago Strain of neck muscle, initial encounter  ? [place], [place]  ? 5 months ago Acute left  ankle pain  ? [facility] [place], Cammie Mcgee, MD  ? 9 months ago Sebaceous cyst  ? [place], Warren T, MD  ? 1 year ago Upper respiratory tract infection, unspecified type  ? [facility] [place], Cammie Mcgee, MD  ? 1 year ago Idiopathic peripheral neuropathy  ? [facility] [place], Cammie Mcgee, MD  ?  ?  ? ?  ?  ?  ?? omeprazole (PRILOSEC) 20 MG capsule [Pharmacy Med Name: OMEPRAZOLE 20 MG CAPSULE DR] 90 capsule 0  ?  Sig: TAKE (1) CAPSULE BY MOUTH ONCE DAILY.  ?  ? Gastroenterology: Proton Pump Inhibitors Passed - 01/19/2022 10:43 AM  ?  ?  Passed - Valid encounter within last 12 months  ?  Recent Outpatient Visits   ?      ? 4 months ago Strain of neck muscle, initial encounter  ? [place], Jessica A, NP  ? 5 months ago Acute left ankle pain  ? [facility] [place], Cammie Mcgee, MD  ? 9 months ago Sebaceous cyst  ? [place], Warren T, MD  ? 1 year ago Upper respiratory tract infection, unspecified type  ? [facility] [place], Cammie Mcgee, MD  ? 1 year ago Idiopathic peripheral neuropathy  ? [facility]  [facility] [place], Cammie Mcgee, MD  ?  ?  ? ?  ?  ?  ? ?

## 2022-03-15 ENCOUNTER — Other Ambulatory Visit: Payer: Self-pay | Admitting: Family Medicine

## 2022-03-29 ENCOUNTER — Ambulatory Visit: Payer: Medicare PPO | Admitting: Family Medicine

## 2022-03-29 VITALS — BP 140/80 | HR 72 | Temp 98.8°F | Ht 63.0 in | Wt 164.0 lb

## 2022-03-29 DIAGNOSIS — E119 Type 2 diabetes mellitus without complications: Secondary | ICD-10-CM

## 2022-03-29 MED ORDER — CELECOXIB 200 MG PO CAPS: ORAL_CAPSULE | ORAL | 11 refills | Status: DC

## 2022-03-29 NOTE — Progress Notes (Signed)
Subjective:    Patient ID: Carolyn Glass, female    DOB: Oct 05, 1955, 66 y.o.   MRN: [id number]  Patient is due today to check fasting lab work.  Her blood pressure is excellent at 140/80.  However she also reports pain in her right posterior hip near the SI joint.  She has pain in her left thumb at the base of the [facility] joint.  She also has pain in the anterior left hip however she had a hip replacement.  There is no lymphadenopathy.  There is no hernia.  She does have some tightness with the flexor muscle so I believe this is iliopsoas tendinitis Past Medical History:  Diagnosis Date   Anxiety    Arthritis    OA hips   Asthma    allergy and exercise induced asthma    Depression    Diabetes mellitus without complication ([place])    Family history of breast cancer    Family history of colon cancer    Family history of stomach cancer    GERD (gastroesophageal reflux disease)    Hypertension    Malignant neoplasm of lower-outer quadrant of right female breast ([place]) 06/21/2016   Seasonal allergies    Past Surgical History:  Procedure Laterality Date   BREAST LUMPECTOMY     right    BREAST RECONSTRUCTION WITH PLACEMENT OF TISSUE EXPANDER AND FLEX HD (ACELLULAR HYDRATED DERMIS) Bilateral 08/13/2016   Procedure: BREAST RECONSTRUCTION WITH PLACEMENT OF TISSUE EXPANDER AND ALLODERM;  Surgeon: Irene Limbo, MD;  Location: [place];  Service: Plastics;  Laterality: Bilateral;   COLONOSCOPY N/A 10/31/2017   Procedure: COLONOSCOPY;  Surgeon: Danie Binder, MD;  [facility];  Service: Endoscopy;  Laterality: N/A;  8:30 Am   DILATION AND CURETTAGE OF UTERUS     JOINT REPLACEMENT     LIPOSUCTION WITH LIPOFILLING Bilateral 11/05/2016   Procedure: LIPOFILLING FROM ABDOMEN TO BILATERAL CHEST;  Surgeon: Irene Limbo, MD;  Location: [place];  Service: Plastics;  Laterality: Bilateral;   MASTECTOMY W/ SENTINEL NODE BIOPSY Bilateral 08/13/2016    Procedure: BILATERAL TOTAL MASTECTOMY WITH RIGHT AXILLARY SENTINEL LYMPH NODE BIOPSY;  Surgeon: [name], MD;  Location: [place];  Service: General;  Laterality: Bilateral;   REMOVAL OF BILATERAL TISSUE EXPANDERS WITH PLACEMENT OF BILATERAL BREAST IMPLANTS Bilateral 11/05/2016   Procedure: REMOVAL OF BILATERAL TISSUE EXPANDERS WITH PLACEMENT OF BILATERAL BREAST SALINE IMPLANTS;  Surgeon: Irene Limbo, MD;  Location: [place];  Service: Plastics;  Laterality: Bilateral;   TOTAL HIP ARTHROPLASTY Right 01/07/2016   Procedure: TOTAL HIP ARTHROPLASTY ANTERIOR APPROACH;  Surgeon: Gaynelle Arabian, MD;  [facility];  Service: Orthopedics;  Laterality: Right;   TOTAL HIP ARTHROPLASTY Left 03/22/2018   Procedure: LEFT TOTAL HIP ARTHROPLASTY ANTERIOR APPROACH;  Surgeon: Gaynelle Arabian, MD;  [facility];  Service: Orthopedics;  Laterality: Left;   Current Outpatient Medications on File Prior to Visit  Medication Sig Dispense Refill   benazepril (LOTENSIN) 20 MG tablet TAKE (1) TABLET BY MOUTH TWICE DAILY. 180 tablet 3   celecoxib (CELEBREX) 200 MG capsule TAKE (1) CAPSULE BY MOUTH TWICE DAILY. 30 capsule 0   escitalopram (LEXAPRO) 20 MG tablet TAKE ONE TABLET BY MOUTH ONCE DAILY. 90 tablet 3   fexofenadine (ALLEGRA) 60 MG tablet Take 60 mg by mouth 2 (two) times daily.     JANUVIA 100 MG tablet TAKE (1) TABLET BY MOUTH ONCE DAILY. 90 tablet 3  metFORMIN (GLUCOPHAGE) 1000 MG tablet TAKE 1 TABLET BY MOUTH TWICE DAILY WITH MEALS. 180 tablet 0   omeprazole (PRILOSEC) 20 MG capsule TAKE (1) CAPSULE BY MOUTH ONCE DAILY. 90 capsule 0   tamoxifen (NOLVADEX) 20 MG tablet TAKE 1 TABLET BY MOUTH ONCE DAILY. 90 tablet 0   tiZANidine (ZANAFLEX) 4 MG tablet Take 1 tablet (4 mg total) by mouth every 6 (six) hours as needed for muscle spasms. Do not take while driving or operating heavy machinery. 30 tablet 0   No current facility-administered medications on file prior to  visit.   Allergies  Allergen Reactions   Corn-Containing Products Itching    Running nose   Tomato Itching    Running nose    Azithromycin Itching and Rash   Biaxin [Clarithromycin] Rash   Codeine Rash    Thinks a codeine cough syrup caused rash.   Social History   Socioeconomic History   Marital status: Married    Spouse name: [name]   Number of children: 1   Years of education: Not on file   Highest education level: Not on file  Occupational History   Not on file  Tobacco Use   Smoking status: Never   Smokeless tobacco: Never  Vaping Use   Vaping Use: Never used  Substance and Sexual Activity   Alcohol use: No   Drug use: No   Sexual activity: Yes  Other Topics Concern   Not on file  Social History Narrative   Married x 37 years in 2022.   1 daughter [name]   1 granddaughter, [name]   Social Determinants of Health   Financial Resource Strain: Low Risk  (07/23/2021)   Overall Financial Resource Strain (CARDIA)    Difficulty of Paying Living Expenses: Not hard at all  Food Insecurity: No Food Insecurity (07/23/2021)   Hunger Vital Sign    Worried About Running Out of Food in the Last Year: Never true    [place] in the Last Year: Never true  Transportation Needs: No Transportation Needs (07/23/2021)   PRAPARE - Hydrologist (Medical): No    Lack of Transportation (Non-Medical): No  Physical Activity: Insufficiently Active (07/23/2021)   Exercise Vital Sign    Days of Exercise per Week: 3 days    Minutes of Exercise per Session: 20 min  Stress: No Stress Concern Present (07/23/2021)   [place]    Feeling of Stress : Only a little  Social Connections: Socially Integrated (07/23/2021)   Social Connection and Isolation Panel [NHANES]    Frequency of Communication with Friends and Family: More than three times a week    Frequency of Social Gatherings with  Friends and Family: More than three times a week    Attends Religious Services: More than 4 times per year    Active Member of [employer] or Organizations: Yes    Attends Music therapist: More than 4 times per year    Marital Status: Married  Human resources officer Violence: Not At Risk (07/23/2021)   Humiliation, Afraid, Rape, and Kick questionnaire    Fear of Current or Ex-Partner: No    Emotionally Abused: No    Physically Abused: No    Sexually Abused: No   Family History  Problem Relation Age of Onset   Arthritis Mother    Asthma Mother    Diabetes Mother    Hyperlipidemia Mother    Hypertension Mother  Arthritis Father    Asthma Father    Stroke Father    Hyperlipidemia Father    Early death Brother 13       Muscular Dystrophy   Birth defects Brother    Cancer Maternal Uncle        possibly stomach   Mental retardation Maternal Uncle    Cancer Paternal Grandmother 79       breast cancer    Cancer Cousin 82       paternal cousin's daughter with breast cancer   Colon cancer Maternal Aunt        dx in her 61s   Leukemia Paternal Uncle    Lymphoma Paternal Uncle    Down syndrome Paternal Uncle       Review of Systems  All other systems reviewed and are negative.      Objective:   Physical Exam Vitals reviewed.  Constitutional:      General: She is not in acute distress.    Appearance: She is well-developed. She is not diaphoretic.  Cardiovascular:     Rate and Rhythm: Normal rate and regular rhythm.     Heart sounds: Normal heart sounds. No murmur heard.    No friction rub. No gallop.  Pulmonary:     Effort: Pulmonary effort is normal. No respiratory distress.     Breath sounds: Normal breath sounds. No wheezing or rales.  Chest:     Chest wall: No tenderness.  Musculoskeletal:        General: No tenderness or deformity. Normal range of motion.     Cervical back: Normal range of motion and neck supple.  Skin:    General: Skin is warm.      Coloration: Skin is not pale.     Findings: No erythema or rash.           Assessment & Plan:  Controlled type 2 diabetes mellitus without complication, without long-term current use of insulin ([place]) - Plan: CBC with Differential/Platelet, Lipid panel, Microalbumin, urine, COMPLETE METABOLIC PANEL WITH GFR, Hemoglobin A1c Blood pressure is acceptable.  Return fasting for CBC CMP lipid panel and an A1c.  Goal A1c is less than 7.  I recommended Voltaren gel for left thumb CMC arthritis.  I recommended iliopsoas muscle stretches for the iliopsoas tendinitis.  We discussed a referral to orthopedics for an SI joint injection but she defers at the present time

## 2022-03-30 ENCOUNTER — Other Ambulatory Visit: Payer: Self-pay | Admitting: Hematology

## 2022-04-09 ENCOUNTER — Other Ambulatory Visit: Payer: Medicare PPO

## 2022-04-12 ENCOUNTER — Other Ambulatory Visit: Payer: Medicare PPO

## 2022-04-12 DIAGNOSIS — E119 Type 2 diabetes mellitus without complications: Secondary | ICD-10-CM | POA: Diagnosis not present

## 2022-04-13 ENCOUNTER — Telehealth: Payer: Self-pay

## 2022-04-13 LAB — CBC WITH DIFFERENTIAL/PLATELET
Absolute Monocytes: 432 cells/uL (ref 200–950)
Basophils Absolute: 41 cells/uL (ref 0–200)
Basophils Relative: 0.9 %
Eosinophils Absolute: 221 cells/uL (ref 15–500)
Eosinophils Relative: 4.9 %
HCT: 39.6 % (ref 35.0–45.0)
Hemoglobin: 13.4 g/dL (ref 11.7–15.5)
Lymphs Abs: 1998 cells/uL (ref 850–3900)
MCH: 31.8 pg (ref 27.0–33.0)
MCHC: 33.8 g/dL (ref 32.0–36.0)
MCV: 94.1 fL (ref 80.0–100.0)
MPV: 10.8 fL (ref 7.5–12.5)
Monocytes Relative: 9.6 %
Neutro Abs: 1809 cells/uL (ref 1500–7800)
Neutrophils Relative %: 40.2 %
Platelets: 259 10*3/uL (ref 140–400)
RBC: 4.21 10*6/uL (ref 3.80–5.10)
RDW: 12.5 % (ref 11.0–15.0)
Total Lymphocyte: 44.4 %
WBC: 4.5 10*3/uL (ref 3.8–10.8)

## 2022-04-13 LAB — LIPID PANEL
Cholesterol: 146 mg/dL
HDL: 38 mg/dL — ABNORMAL LOW
LDL Cholesterol (Calc): 78 mg/dL
Non-HDL Cholesterol (Calc): 108 mg/dL
Total CHOL/HDL Ratio: 3.8 (calc)
Triglycerides: 198 mg/dL — ABNORMAL HIGH

## 2022-04-13 LAB — COMPLETE METABOLIC PANEL WITH GFR
AG Ratio: 1.9 (calc) (ref 1.0–2.5)
ALT: 30 U/L — ABNORMAL HIGH (ref 6–29)
AST: 25 U/L (ref 10–35)
Albumin: 4.1 g/dL (ref 3.6–5.1)
Alkaline phosphatase (APISO): 53 U/L (ref 37–153)
BUN: 10 mg/dL (ref 7–25)
CO2: 29 mmol/L (ref 20–32)
Calcium: 8.9 mg/dL (ref 8.6–10.4)
Chloride: 103 mmol/L (ref 98–110)
Creat: 0.61 mg/dL (ref 0.50–1.05)
Globulin: 2.2 g/dL (calc) (ref 1.9–3.7)
Glucose, Bld: 111 mg/dL — ABNORMAL HIGH (ref 65–99)
Potassium: 4.4 mmol/L (ref 3.5–5.3)
Sodium: 141 mmol/L (ref 135–146)
Total Bilirubin: 0.4 mg/dL (ref 0.2–1.2)
Total Protein: 6.3 g/dL (ref 6.1–8.1)
eGFR: 99 mL/min/{1.73_m2} (ref >=60)

## 2022-04-13 LAB — HEMOGLOBIN A1C
Hgb A1c MFr Bld: 6.4 % of total Hgb — ABNORMAL HIGH (ref <5.7)
Mean Plasma Glucose: 137 mg/dL
eAG (mmol/L): 7.6 mmol/L

## 2022-04-13 LAB — MICROALBUMIN, URINE: Microalb, Ur: 0.9 mg/dL

## 2022-04-13 NOTE — Telephone Encounter (Signed)
Called pt regarding lab results. Pt voiced understanding, nothing at this time. Per pt will call later to make  appointments.

## 2022-04-21 ENCOUNTER — Other Ambulatory Visit: Payer: Self-pay | Admitting: Family Medicine

## 2022-04-21 DIAGNOSIS — K219 Gastro-esophageal reflux disease without esophagitis: Secondary | ICD-10-CM

## 2022-04-22 NOTE — Telephone Encounter (Signed)
Requested Prescriptions  Pending Prescriptions Disp Refills  . omeprazole (PRILOSEC) 20 MG capsule [Pharmacy Med Name: OMEPRAZOLE 20 MG CAPSULE DR] 90 capsule 0    Sig: TAKE (1) CAPSULE BY MOUTH ONCE DAILY.     Gastroenterology: Proton Pump Inhibitors Passed - 04/21/2022  1:13 PM      Passed - Valid encounter within last 12 months    Recent Outpatient Visits          7 months ago Strain of neck muscle, initial encounter   [place] Eulogio Bear, NP   8 months ago Acute left ankle pain   [place] Scha[place], Cammie Mcgee, MD   1 year ago Sebaceous cyst   [place] [name], MD   1 year ago Upper respiratory tract infection, unspecified type   [place] [name], MD   2 years ago Idiopathic peripheral neuropathy   [place], Cammie Mcgee, MD

## 2022-06-10 ENCOUNTER — Other Ambulatory Visit: Payer: Self-pay | Admitting: Family Medicine

## 2022-06-10 NOTE — Telephone Encounter (Signed)
Last OV 03/29/22, will refill medication.  Requested Prescriptions  Pending Prescriptions Disp Refills  . metFORMIN (GLUCOPHAGE) 1000 MG tablet [Pharmacy Med Name: METFORMIN HCL 1,000 MG TABLET] 180 tablet 0    Sig: TAKE 1 TABLET BY MOUTH TWICE DAILY WITH MEALS.     Endocrinology:  Diabetes - Biguanides Failed - 06/10/2022  9:53 AM      Failed - Valid encounter within last 6 months    Recent Outpatient Visits          8 months ago Strain of neck muscle, initial encounter   [place] Eulogio Bear, NP   9 months ago Acute left ankle pain   [place] Dennard Schaumann, Cammie Mcgee, MD   1 year ago Sebaceous cyst   [place] [name], MD   1 year ago Upper respiratory tract infection, unspecified type   [place] [name], MD   2 years ago Idiopathic peripheral neuropathy   [place] Pickard, Cammie Mcgee, MD             Passed - Cr in normal range and within 360 days    Creatinine  Date Value Ref Range Status  06/13/2017 0.8 0.6 - 1.1 mg/dL Final   Creat  Date Value Ref Range Status  04/12/2022 0.61 0.50 - 1.05 mg/dL Final   Creatinine, Urine  Date Value Ref Range Status  07/23/2021 80 20 - 275 mg/dL Final         Passed - HBA1C is between 0 and 7.9 and within 180 days    Hgb A1c MFr Bld  Date Value Ref Range Status  04/12/2022 6.4 (H) <5.7 % of total Hgb Final    Comment:    For someone without known diabetes, a hemoglobin  A1c value between 5.7% and 6.4% is consistent with prediabetes and should be confirmed with a  follow-up test. . For someone with known diabetes, a value <7% indicates that their diabetes is well controlled. A1c targets should be individualized based on duration of diabetes, age, comorbid conditions, and other considerations. . This assay result is consistent with an increased risk of diabetes. . Currently, no consensus exists regarding use  of hemoglobin A1c for diagnosis of diabetes for children. .          Passed - eGFR in normal range and within 360 days    GFR, Est African American  Date Value Ref Range Status  02/19/2020 108 > OR = 60 mL/min/1.13m Final   GFR, Est Non African American  Date Value Ref Range Status  02/19/2020 93 > OR = 60 mL/min/1.729mFinal   GFR, Estimated  Date Value Ref Range Status  12/16/2021 >60 >60 mL/min Final    Comment:    (NOTE) Calculated using the CKD-EPI Creatinine Equation (2021)   06/17/2021 >60 >60 mL/min Final    Comment:    (NOTE) Calculated using the CKD-EPI Creatinine Equation (2021)    eGFR  Date Value Ref Range Status  04/12/2022 99 > OR = 60 mL/min/1.7358minal         Passed - B12 Level in normal range and within 720 days    Vitamin B-12  Date Value Ref Range Status  07/23/2021 383 200 - 1,100 pg/mL Final    Comment:    . Please Note: Although the reference range for vitamin B12 is [id number] pg/mL, it has been reported that between 5 and 10% of patients  with values between 200 and 400 pg/mL may experience neuropsychiatric and hematologic abnormalities due to occult B12 deficiency; less than 1% of patients with values above 400 pg/mL will have symptoms. .          Passed - CBC within normal limits and completed in the last 12 months    WBC  Date Value Ref Range Status  04/12/2022 4.5 3.8 - 10.8 Thousand/uL Final   RBC  Date Value Ref Range Status  04/12/2022 4.21 3.80 - 5.10 Million/uL Final   Hemoglobin  Date Value Ref Range Status  04/12/2022 13.4 11.7 - 15.5 g/dL Final   HGB  Date Value Ref Range Status  06/13/2017 12.8 11.6 - 15.9 g/dL Final   HCT  Date Value Ref Range Status  04/12/2022 39.6 35.0 - 45.0 % Final  06/13/2017 38.5 34.8 - 46.6 % Final   MCHC  Date Value Ref Range Status  04/12/2022 33.8 32.0 - 36.0 g/dL Final   [facility]  Date Value Ref Range Status  04/12/2022 31.8 27.0 - 33.0 pg Final   MCV  Date Value Ref Range  Status  04/12/2022 94.1 80.0 - 100.0 fL Final  06/13/2017 95.2 79.5 - 101.0 fL Final   No results found for: "PLTCOUNTKUC", "LABPLAT", "POCPLA" RDW  Date Value Ref Range Status  04/12/2022 12.5 11.0 - 15.0 % Final  06/13/2017 13.8 11.2 - 14.5 % Final

## 2022-06-17 ENCOUNTER — Inpatient Hospital Stay: Payer: Medicare PPO | Admitting: Hematology

## 2022-06-17 ENCOUNTER — Other Ambulatory Visit: Payer: Self-pay

## 2022-06-17 ENCOUNTER — Inpatient Hospital Stay: Payer: Medicare PPO | Attending: Hematology

## 2022-06-17 ENCOUNTER — Encounter: Payer: Self-pay | Admitting: Hematology

## 2022-06-17 VITALS — BP 148/81 | HR 77 | Temp 98.3°F | Resp 18 | Ht 63.0 in | Wt 162.5 lb

## 2022-06-17 DIAGNOSIS — M8580 Other specified disorders of bone density and structure, unspecified site: Secondary | ICD-10-CM | POA: Diagnosis not present

## 2022-06-17 DIAGNOSIS — Z9013 Acquired absence of bilateral breasts and nipples: Secondary | ICD-10-CM | POA: Insufficient documentation

## 2022-06-17 DIAGNOSIS — Z803 Family history of malignant neoplasm of breast: Secondary | ICD-10-CM | POA: Diagnosis not present

## 2022-06-17 DIAGNOSIS — C50511 Malignant neoplasm of lower-outer quadrant of right female breast: Secondary | ICD-10-CM

## 2022-06-17 DIAGNOSIS — Z800 Family history of malignant neoplasm of digestive organs: Secondary | ICD-10-CM | POA: Diagnosis not present

## 2022-06-17 DIAGNOSIS — E119 Type 2 diabetes mellitus without complications: Secondary | ICD-10-CM | POA: Diagnosis not present

## 2022-06-17 DIAGNOSIS — M1990 Unspecified osteoarthritis, unspecified site: Secondary | ICD-10-CM | POA: Diagnosis not present

## 2022-06-17 DIAGNOSIS — Z170 Estrogen receptor positive status [ER+]: Secondary | ICD-10-CM

## 2022-06-17 DIAGNOSIS — Z853 Personal history of malignant neoplasm of breast: Secondary | ICD-10-CM | POA: Diagnosis not present

## 2022-06-17 DIAGNOSIS — Z79810 Long term (current) use of selective estrogen receptor modulators (SERMs): Secondary | ICD-10-CM | POA: Diagnosis not present

## 2022-06-17 LAB — COMPREHENSIVE METABOLIC PANEL
ALT: 26 U/L (ref 0–44)
AST: 24 U/L (ref 15–41)
Albumin: 4.1 g/dL (ref 3.5–5.0)
Alkaline Phosphatase: 59 U/L (ref 38–126)
Anion gap: 4 — ABNORMAL LOW (ref 5–15)
BUN: 20 mg/dL (ref 8–23)
CO2: 30 mmol/L (ref 22–32)
Calcium: 8.9 mg/dL (ref 8.9–10.3)
Chloride: 106 mmol/L (ref 98–111)
Creatinine, Ser: 0.67 mg/dL (ref 0.44–1.00)
GFR, Estimated: >60 mL/min (ref >60)
Glucose, Bld: 107 mg/dL — ABNORMAL HIGH (ref 70–99)
Potassium: 4.3 mmol/L (ref 3.5–5.1)
Sodium: 140 mmol/L (ref 135–145)
Total Bilirubin: 0.3 mg/dL (ref 0.3–1.2)
Total Protein: 6.9 g/dL (ref 6.5–8.1)

## 2022-06-17 LAB — CBC WITH DIFFERENTIAL/PLATELET
Abs Immature Granulocytes: 0.02 10*3/uL (ref 0.00–0.07)
Basophils Absolute: 0 10*3/uL (ref 0.0–0.1)
Basophils Relative: 1 %
Eosinophils Absolute: 0.6 10*3/uL — ABNORMAL HIGH (ref 0.0–0.5)
Eosinophils Relative: 8 %
HCT: 39.3 % (ref 36.0–46.0)
Hemoglobin: 13.2 g/dL (ref 12.0–15.0)
Immature Granulocytes: 0 %
Lymphocytes Relative: 26 %
Lymphs Abs: 1.8 10*3/uL (ref 0.7–4.0)
MCH: 32.2 pg (ref 26.0–34.0)
MCHC: 33.6 g/dL (ref 30.0–36.0)
MCV: 95.9 fL (ref 80.0–100.0)
Monocytes Absolute: 0.6 10*3/uL (ref 0.1–1.0)
Monocytes Relative: 9 %
Neutro Abs: 3.8 10*3/uL (ref 1.7–7.7)
Neutrophils Relative %: 56 %
Platelets: 228 10*3/uL (ref 150–400)
RBC: 4.1 MIL/uL (ref 3.87–5.11)
RDW: 13 % (ref 11.5–15.5)
WBC: 6.8 10*3/uL (ref 4.0–10.5)
nRBC: 0 % (ref 0.0–0.2)

## 2022-06-17 MED ORDER — TAMOXIFEN CITRATE 20 MG PO TABS: 20.0000 mg | ORAL_TABLET | Freq: Every day | ORAL | 1 refills | Status: DC

## 2022-06-17 NOTE — Progress Notes (Signed)
[place]   [phone] [phone]   Clinic Follow up Note   Patient Care Team: [name], MD as PCP - General (Family Medicine) [name], MD (Inactive) as Consulting Physician (General Surgery) [name], MD as Consulting Physician (Hematology) [name], MD as Attending Physician (Radiation Oncology) [name], NP as Nurse Practitioner (Hematology and Oncology)  Date of Service:  06/17/2022  CHIEF COMPLAINT: f/u of right breast cancer  CURRENT THERAPY:  Antiestrogen therapy, started 09/25/16, currently tamoxifen since 03/19/21  ASSESSMENT & PLAN:  Carolyn Glass is a 66 y.o. post-menopausal female with   1. Malignant neoplasm of lower-outer quadrant of right female breast, invasive lobular carcinoma and LCIS, mpT1cN0M0, stage IA, G2, ER+/PR+/HER2-, oncotype low risk  -diagnosed in 06/2016. S/p b/l mastectomy and breast reconstruction surgery.  -Oncotype RS score 11, low risk.  -She has been on anti-estrogen therapy since 09/2016. Plan for total 7-10 years due to lobular histology. She was on exemestane through 12/2020 and switched to tamoxifen due to AEs in 03/2021. Will continue until 09/2026 if she tolerates well  -She is clinically doing well. Labs reviewed, overall WNL. Physical exam was unremarkable. -Continue surveillance. No need for mammogram given s/p b/l mastectomy. We reviewed concerning symptoms of metastatic disease to watch for, including big bone pain, bloating and/or abdominal pain, new palpable nodules. -f/u in 6 months, then annually.   2. Arthritis  -She is s/p b/l hip replacements.  -Will monitor and f/u with Orthopedics in interim.   3. Osteopenia -Her 05/31/18 DEXA showed lowest T-score at -1.9, slightly worse than previous scan.  -Will continue Vitamin D and Calcium 500-600 units daily. -she is due for repeat DEXA, which she previously had done at [facility]. Order is in  place.   4. Cancer Screening, Genetic testing negative -Her last colonoscopy in 10/2017 showed beginning stages of diverticulitis. She is due for repeat 2024-2026. -she will continue pap every 3 years   5. DM, type 2 -Continue medications and f/u with PCP.      PLAN -continue tamoxifen -DEXA to be done anytime at her GYN office  -Lab and f/u in 7 months    No problem-specific Assessment & Plan notes found for this encounter.   SUMMARY OF ONCOLOGIC HISTORY: Oncology History Overview Note  Malignant neoplasm of lower-outer quadrant of right female breast [facility])   Staging form: Breast, AJCC 7th Edition   - Clinical stage from 06/23/2016: Stage IA (T1c, N0, M0) - Unsigned    Malignant neoplasm of lower-outer quadrant of right female breast ([place])  04/28/2015 Imaging   Bone Density Report 04/28/2015 [facility] Spine (L1-L4): T-Score = -1.7. Osteopenia Femoral Neck (Left) : T-Score = -1.0 Normal Femoral Neck (Right): T-Score = 0.4 Normal   03/01/2016 Mammogram   Bilateral screening mammogram was negative   06/08/2016 Imaging   Bilateral breast MRI with and without contrast showed 4 lesions in the right breast, 2 adjacent 1.4 cm mass in the inferior aspect of the right breast, and in the right lateral aspect of the breast, there are 8 mm and a 7 mm irregular enhancing mass. Multiple small enhancing lesions within the liver, indeterminate. No axillary adenopathy.   06/16/2016 Initial Diagnosis   Malignant neoplasm of lower-outer quadrant of right female breast ([place])   06/16/2016 Initial Biopsy   Right breast needle core biopsy at 9 clock, 6:30 clock, 6 clock showed invasive lobular carcinoma, and LCIS   06/16/2016 Receptors her2   two  right breast biopsy showed ER strongly positive, PR strongly positive, HER-2 negative, Ki-67 5-10%    06/16/2016 Imaging   Right breast and axilla ultrasound showed a 1.4 cm mass at the 6:30 o'clock, 1.0 cm mass at the 6:00, and 68m mass at 9:00 position.  Axilla was negative.   06/28/2016 Imaging   MRI ABDOMEN W W/O CONTRAST 06/28/2016 IMPRESSION: 1. Multiple hepatic hemangiomas.  No suspicious liver lesion. 2. Hepatic steatosis. 3. No evidence of abdominal metastasis.   06/28/2016 Genetic Testing   Patient has genetic testing done for breast cancer. Results revealed patient has the following variant called, MSH2 [id number]>T.   08/13/2016 Surgery   Bilateral simple mastectomy and right SLN biopsy     08/13/2016 Pathology Results   Right mastectomy showed invasive ductal carcinoma, G2, multifocal, 1.5cm, 1.5cm, and 1.0cm. (+) LCIS, margins are negative, 1 sentinel lymph node was negative. Left simple mastectomy showed LCIS, ADH, no malignancy.    08/13/2016 Oncotype testing   Oncotype recurrence score 11, which predicts 10 year distant recurrence risk of 7% with tamoxifen alone, low risk category. No adjuvant chemo recommended based on this.    09/25/2016 -  Anti-estrogen oral therapy   Adjuvant exemestane 263mdaily    11/05/2016 Surgery   REMOVAL OF BILATERAL TISSUE EXPANDERS WITH PLACEMENT OF BILATERAL BREAST SALINE IMPLANTS by Dr. ThIran Planas     INTERVAL HISTORY:  Carolyn RENNEs here for a follow up of breast cancer. She was last seen by me on 12/16/21. She presents to the clinic accompanied by her husband. She reports she is doing well overall. She reports manageable hot flashes and very mild joint pain from tamoxifen. She notes she has been trying to lose weight.   All other systems were reviewed with the patient and are negative.  MEDICAL HISTORY:  Past Medical History:  Diagnosis Date   Anxiety    Arthritis    OA hips   Asthma    allergy and exercise induced asthma    Depression    Diabetes mellitus without complication ([place]   Family history of breast cancer    Family history of colon cancer    Family history of stomach cancer    GERD (gastroesophageal reflux disease)    Hypertension    Malignant neoplasm of  lower-outer quadrant of right female breast ([facility]   Seasonal allergies     SURGICAL HISTORY: Past Surgical History:  Procedure Laterality Date   BREAST LUMPECTOMY     right    BREAST RECONSTRUCTION WITH PLACEMENT OF TISSUE EXPANDER AND FLEX HD (ACELLULAR HYDRATED DERMIS) Bilateral 08/13/2016   Procedure: BREAST RECONSTRUCTION WITH PLACEMENT OF TISSUE EXPANDER AND ALLODERM;  Surgeon: BrIrene LimboMD;  [facility];  Laterality: Bilateral;   COLONOSCOPY N/A 10/31/2017   Procedure: COLONOSCOPY;  Surgeon: FiDanie BinderMD;  [facility];  Service: Endoscopy;  Laterality: N/A;  8:30 Am   DILATION AND CURETTAGE OF UTERUS     JOINT REPLACEMENT     LIPOSUCTION WITH LIPOFILLING Bilateral 11/05/2016   Procedure: LIPOFILLING FROM ABDOMEN TO BILATERAL CHEST;  Surgeon: BrIrene LimboMD;  [place] [facility] [place];  Laterality: Bilateral;   MASTECTOMY W/ SENTINEL NODE BIOPSY Bilateral 08/13/2016   Procedure: BILATERAL TOTAL MASTECTOMY WITH RIGHT AXILLARY SENTINEL LYMPH NODE BIOPSY;  Surgeon: BeExcell SeltzerMD;  [place] Service: General;  Laterality: Bilateral;   REMOVAL OF BILATERAL TISSUE  EXPANDERS WITH PLACEMENT OF BILATERAL BREAST IMPLANTS Bilateral 11/05/2016   Procedure: REMOVAL OF BILATERAL TISSUE EXPANDERS WITH PLACEMENT OF BILATERAL BREAST SALINE IMPLANTS;  Surgeon: Irene Limbo, MD;  Location: [place];  Service: Plastics;  Laterality: Bilateral;   TOTAL HIP ARTHROPLASTY Right 01/07/2016   Procedure: TOTAL HIP ARTHROPLASTY ANTERIOR APPROACH;  Surgeon: Gaynelle Arabian, MD;  Location[facility];  Service: Orthopedics;  Laterality: Right;   TOTAL HIP ARTHROPLASTY Left 03/22/2018   Procedure: LEFT TOTAL HIP ARTHROPLASTY ANTERIOR APPROACH;  Surgeon: Gaynelle Arabian, MD;  Location: [facility];  Service: Orthopedics;  Laterality: Left;    I have reviewed the  social history and family history with the patient and they are unchanged from previous note.  ALLERGIES:  is allergic to corn-containing products, tomato, azithromycin, biaxin [clarithromycin], and codeine.  MEDICATIONS:  Current Outpatient Medications  Medication Sig Dispense Refill   benazepril (LOTENSIN) 20 MG tablet TAKE (1) TABLET BY MOUTH TWICE DAILY. 180 tablet 3   celecoxib (CELEBREX) 200 MG capsule TAKE (1) CAPSULE BY MOUTH TWICE DAILY. 30 capsule 11   escitalopram (LEXAPRO) 20 MG tablet TAKE ONE TABLET BY MOUTH ONCE DAILY. 90 tablet 3   fexofenadine (ALLEGRA) 60 MG tablet Take 60 mg by mouth 2 (two) times daily.     JANUVIA 100 MG tablet TAKE (1) TABLET BY MOUTH ONCE DAILY. 90 tablet 3   metFORMIN (GLUCOPHAGE) 1000 MG tablet TAKE 1 TABLET BY MOUTH TWICE DAILY WITH MEALS. 180 tablet 0   omeprazole (PRILOSEC) 20 MG capsule TAKE (1) CAPSULE BY MOUTH ONCE DAILY. 90 capsule 0   tamoxifen (NOLVADEX) 20 MG tablet Take 1 tablet (20 mg total) by mouth daily. 90 tablet 1   tiZANidine (ZANAFLEX) 4 MG tablet Take 1 tablet (4 mg total) by mouth every 6 (six) hours as needed for muscle spasms. Do not take while driving or operating heavy machinery. 30 tablet 0   No current facility-administered medications for this visit.    PHYSICAL EXAMINATION: ECOG PERFORMANCE STATUS: 0 - Asymptomatic  Vitals:   06/17/22 1047  BP: (!) 148/81  Pulse: 77  Resp: 18  Temp: 98.3 F (36.8 C)  SpO2: 97%   Wt Readings from Last 3 Encounters:  06/17/22 162 lb 8 oz (73.7 kg)  03/29/22 164 lb (74.4 kg)  12/16/21 170 lb 1.6 oz (77.2 kg)     GENERAL:alert, no distress and comfortable SKIN: skin color, texture, turgor are normal, no rashes or significant lesions EYES: normal, Conjunctiva are pink and non-injected, sclera clear  NECK: supple, thyroid normal size, non-tender, without nodularity LYMPH:  no palpable lymphadenopathy in the cervical, axillary LUNGS: clear to auscultation and percussion with  normal breathing effort HEART: regular rate & rhythm and no murmurs and no lower extremity edema ABDOMEN:abdomen soft, non-tender and normal bowel sounds Musculoskeletal:no cyanosis of digits and no clubbing  NEURO: alert & oriented x 3 with fluent speech, no focal motor/sensory deficits BREAST: No palpable mass, nodules or adenopathy bilaterally. Breast exam benign.   LABORATORY DATA:  I have reviewed the data as listed    Latest Ref Rng & Units 06/17/2022   10:12 AM 04/12/2022    8:05 AM 12/16/2021   10:48 AM  CBC  WBC 4.0 - 10.5 K/uL 6.8  4.5  4.0   Hemoglobin 12.0 - 15.0 g/dL 13.2  13.4  13.5   Hematocrit 36.0 - 46.0 % 39.3  39.6  39.4   Platelets 150 - 400 K/uL 228  259  247  Latest Ref Rng & Units 06/17/2022   10:12 AM 04/12/2022    8:05 AM 12/16/2021   10:48 AM  CMP  Glucose 70 - 99 mg/dL 107  111  132   BUN 8 - 23 mg/dL _0 Creatinine 0.44 - 1.00 mg/dL 0.67  0.61  0.66   Sodium 135 - 145 mmol/L 140  141  139   Potassium 3.5 - 5.1 mmol/L 4.3  4.4  4.1   Chloride 98 - 111 mmol/L 106  103  105   CO2 22 - 32 mmol/L _1 Calcium 8.9 - 10.3 mg/dL 8.9  8.9  9.3   Total Protein 6.5 - 8.1 g/dL 6.9  6.3  7.1   Total Bilirubin 0.3 - 1.2 mg/dL 0.3  0.4  0.5   Alkaline Phos 38 - 126 U/L 59   58   AST 15 - 41 U/L 24  25  45   ALT 0 - 44 U/L 26  30  48       RADIOGRAPHIC STUDIES: I have personally reviewed the radiological images as listed and agreed with the findings in the report. No results found.    No orders of the defined types were placed in this encounter.  All questions were answered. The patient knows to call the clinic with any problems, questions or concerns. No barriers to learning was detected. The total time spent in the appointment was 30 minutes.     [name], MD 06/17/2022   I, Wilburn Mylar, am acting as scribe for [name], MD.   I have reviewed the above documentation for accuracy and completeness, and I agree with the  above.

## 2022-06-23 ENCOUNTER — Other Ambulatory Visit: Payer: Self-pay | Admitting: Family Medicine

## 2022-06-23 NOTE — Telephone Encounter (Signed)
Requested medication (s) are due for refill today - no  Requested medication (s) are on the active medication list -no  Future visit scheduled -no  Last refill: 05/10/12  Notes to clinic: medication no longer on current medication list  Requested Prescriptions  Pending Prescriptions Disp Refills   albuterol (VENTOLIN HFA) 108 (90 Base) MCG/ACT inhaler [Pharmacy Med Name: ALBUTEROL HFA INHALER 18GM] 18 g 0    Sig: INHALE 2 PUFFS EVERY 6 HOURS AS NEEDED FOR SHORTNESS OF BREATH OR WHEEZING.     Pulmonology:  Beta Agonists 2 Failed - 06/23/2022 11:53 AM      Failed - Last BP in normal range    BP Readings from Last 1 Encounters:  06/17/22 (!) 148/81         Passed - Last Heart Rate in normal range    Pulse Readings from Last 1 Encounters:  06/17/22 77         Passed - Valid encounter within last 12 months    Recent Outpatient Visits           9 months ago Strain of neck muscle, initial encounter   [place] Eulogio Bear, NP   10 months ago Acute left ankle pain   [place] [facility] Dennard Schaumann, Cammie Mcgee, MD   1 year ago Sebaceous cyst   [place] Dennard Schaumann Cammie Mcgee, MD   1 year ago Upper respiratory tract infection, unspecified type   [place] Pickard, Cammie Mcgee, MD   2 years ago Idiopathic peripheral neuropathy   [place] Pickard, Cammie Mcgee, MD                 Requested Prescriptions  Pending Prescriptions Disp Refills   albuterol (VENTOLIN HFA) 108 (90 Base) MCG/ACT inhaler [Pharmacy Med Name: ALBUTEROL HFA INHALER 18GM] 18 g 0    Sig: INHALE 2 PUFFS EVERY 6 HOURS AS NEEDED FOR SHORTNESS OF BREATH OR WHEEZING.     Pulmonology:  Beta Agonists 2 Failed - 06/23/2022 11:53 AM      Failed - Last BP in normal range    BP Readings from Last 1 Encounters:  06/17/22 (!) 148/81         Passed - Last Heart Rate in normal range    Pulse Readings from Last 1 Encounters:  06/17/22 77          Passed - Valid encounter within last 12 months    Recent Outpatient Visits           9 months ago Strain of neck muscle, initial encounter   [place] Eulogio Bear, NP   10 months ago Acute left ankle pain   [place] Dennard Schaumann, Cammie Mcgee, MD   1 year ago Sebaceous cyst   [place] [name], MD   1 year ago Upper respiratory tract infection, unspecified type   [place] [name], MD   2 years ago Idiopathic peripheral neuropathy   [place] Pickard, Cammie Mcgee, MD

## 2022-07-20 ENCOUNTER — Other Ambulatory Visit: Payer: Self-pay | Admitting: Family Medicine

## 2022-07-20 DIAGNOSIS — K219 Gastro-esophageal reflux disease without esophagitis: Secondary | ICD-10-CM

## 2022-07-20 NOTE — Telephone Encounter (Signed)
Requested Prescriptions  Pending Prescriptions Disp Refills   omeprazole (PRILOSEC) 20 MG capsule [Pharmacy Med Name: OMEPRAZOLE 20 MG CAPSULE DR] 90 capsule 0    Sig: TAKE (1) CAPSULE BY MOUTH ONCE DAILY.     Gastroenterology: Proton Pump Inhibitors Passed - 07/20/2022  9:38 AM      Passed - Valid encounter within last 12 months    Recent Outpatient Visits           10 months ago Strain of neck muscle, initial encounter   [place] Eulogio Bear, NP   11 months ago Acute left ankle pain   [place] Dennard Schaumann, Cammie Mcgee, MD   1 year ago Sebaceous cyst   [place] [name], MD   1 year ago Upper respiratory tract infection, unspecified type   [place] [name], MD   2 years ago Idiopathic peripheral neuropathy   [place] Pickard, Cammie Mcgee, MD

## 2022-07-27 ENCOUNTER — Other Ambulatory Visit: Payer: Self-pay | Admitting: Family Medicine

## 2022-08-04 ENCOUNTER — Encounter: Payer: Self-pay | Admitting: Family Medicine

## 2022-08-04 ENCOUNTER — Ambulatory Visit: Payer: Medicare PPO | Admitting: Family Medicine

## 2022-08-04 VITALS — BP 138/80 | HR 100 | Temp 98.0°F | Ht 63.0 in | Wt 153.0 lb

## 2022-08-04 DIAGNOSIS — K219 Gastro-esophageal reflux disease without esophagitis: Secondary | ICD-10-CM

## 2022-08-04 DIAGNOSIS — R1013 Epigastric pain: Secondary | ICD-10-CM

## 2022-08-04 NOTE — Progress Notes (Signed)
Acute Office Visit  Subjective:     Patient ID: Carolyn Glass, female    DOB: 06-04-1956, 66 y.o.   MRN: [id number]  Chief Complaint  Patient presents with   Follow-up    Stomach issues prob due to going out to eat Nsal congestion    HPI Patient is in today for mild epigastric and RUQ stomach pain since last night. She has this pain weekly for a day or so. Worse with chocolate and at night. She is also experiencing some mild congestion and low grade fever. Denies nausea, vomiting, diarrhea, blood in stool, severe abdominal pain, burping. Has tried Pepto Bismol and Milk and Tums Reports her grandson has had a stomach virus.   Review of Systems  Reason unable to perform ROS: see HPI.  All other systems reviewed and are negative.  Past Medical History:  Diagnosis Date   Anxiety    Arthritis    OA hips   Asthma    allergy and exercise induced asthma    Depression    Diabetes mellitus without complication ([place])    Family history of breast cancer    Family history of colon cancer    Family history of stomach cancer    GERD (gastroesophageal reflux disease)    Hypertension    Malignant neoplasm of lower-outer quadrant of right female breast ([place]) 06/21/2016   Seasonal allergies    Past Surgical History:  Procedure Laterality Date   BREAST LUMPECTOMY     right    BREAST RECONSTRUCTION WITH PLACEMENT OF TISSUE EXPANDER AND FLEX HD (ACELLULAR HYDRATED DERMIS) Bilateral 08/13/2016   Procedure: BREAST RECONSTRUCTION WITH PLACEMENT OF TISSUE EXPANDER AND ALLODERM;  Surgeon: Irene Limbo, MD;  Location: [facility];  Service: Plastics;  Laterality: Bilateral;   COLONOSCOPY N/A 10/31/2017   Procedure: COLONOSCOPY;  Surgeon: Danie Binder, MD;  [facility];  Service: Endoscopy;  Laterality: N/A;  8:30 Am   DILATION AND CURETTAGE OF UTERUS     JOINT REPLACEMENT     LIPOSUCTION WITH LIPOFILLING Bilateral 11/05/2016   Procedure: LIPOFILLING FROM  ABDOMEN TO BILATERAL CHEST;  Surgeon: Irene Limbo, MD;  Location: [place];  Service: Plastics;  Laterality: Bilateral;   MASTECTOMY W/ SENTINEL NODE BIOPSY Bilateral 08/13/2016   Procedure: BILATERAL TOTAL MASTECTOMY WITH RIGHT AXILLARY SENTINEL LYMPH NODE BIOPSY;  Surgeon: [name], MD;  Location: [place];  Service: General;  Laterality: Bilateral;   REMOVAL OF BILATERAL TISSUE EXPANDERS WITH PLACEMENT OF BILATERAL BREAST IMPLANTS Bilateral 11/05/2016   Procedure: REMOVAL OF BILATERAL TISSUE EXPANDERS WITH PLACEMENT OF BILATERAL BREAST SALINE IMPLANTS;  Surgeon: Irene Limbo, MD;  Location: [place];  Service: Plastics;  Laterality: Bilateral;   TOTAL HIP ARTHROPLASTY Right 01/07/2016   Procedure: TOTAL HIP ARTHROPLASTY ANTERIOR APPROACH;  Surgeon: Gaynelle Arabian, MD;  [facility];  Service: Orthopedics;  Laterality: Right;   TOTAL HIP ARTHROPLASTY Left 03/22/2018   Procedure: LEFT TOTAL HIP ARTHROPLASTY ANTERIOR APPROACH;  Surgeon: Gaynelle Arabian, MD;  [facility];  Service: Orthopedics;  Laterality: Left;   Current Outpatient Medications on File Prior to Visit  Medication Sig Dispense Refill   albuterol (VENTOLIN HFA) 108 (90 Base) MCG/ACT inhaler INHALE 2 PUFFS EVERY 6 HOURS AS NEEDED FOR SHORTNESS OF BREATH OR WHEEZING. 18 g 0   benazepril (LOTENSIN) 20 MG tablet TAKE (1) TABLET BY MOUTH TWICE DAILY. 180 tablet 3   celecoxib (CELEBREX) 200 MG capsule TAKE (1) CAPSULE BY  MOUTH TWICE DAILY. 30 capsule 11   escitalopram (LEXAPRO) 20 MG tablet TAKE ONE TABLET BY MOUTH ONCE DAILY. 90 tablet 3   fexofenadine (ALLEGRA) 60 MG tablet Take 60 mg by mouth 2 (two) times daily.     JANUVIA 100 MG tablet TAKE (1) TABLET BY MOUTH ONCE DAILY. 90 tablet 3   metFORMIN (GLUCOPHAGE) 1000 MG tablet TAKE 1 TABLET BY MOUTH TWICE DAILY WITH MEALS. 180 tablet 0   omeprazole (PRILOSEC) 20 MG capsule TAKE (1) CAPSULE BY MOUTH ONCE DAILY. 90  capsule 0   tamoxifen (NOLVADEX) 20 MG tablet Take 1 tablet (20 mg total) by mouth daily. 90 tablet 1   tiZANidine (ZANAFLEX) 4 MG tablet Take 1 tablet (4 mg total) by mouth every 6 (six) hours as needed for muscle spasms. Do not take while driving or operating heavy machinery. 30 tablet 0   No current facility-administered medications on file prior to visit.   Allergies  Allergen Reactions   Corn-Containing Products Itching    Running nose   Tomato Itching    Running nose    Azithromycin Itching and Rash   Biaxin [Clarithromycin] Rash   Codeine Rash    Thinks a codeine cough syrup caused rash.        Objective:    BP 138/80   Pulse 100   Temp 98 F (36.7 C) (Oral)   Ht '5\' 3"'$  (1.6 m)   Wt 153 lb (69.4 kg)   SpO2 98%   BMI 27.10 kg/m   Physical Exam Vitals and nursing note reviewed.  Constitutional:      Appearance: Normal appearance. She is normal weight.  HENT:     Head: Normocephalic and atraumatic.  Abdominal:     General: Bowel sounds are normal. There is no distension.     Palpations: Abdomen is soft. There is no mass.     Tenderness: There is no abdominal tenderness. There is no guarding or rebound.     Hernia: No hernia is present.  Skin:    General: Skin is warm and dry.  Neurological:     General: No focal deficit present.     Mental Status: She is alert and oriented to person, place, and time. Mental status is at baseline.  Psychiatric:        Mood and Affect: Mood normal.        Behavior: Behavior normal.        Thought Content: Thought content normal.        Judgment: Judgment normal.     No results found for any visits on 08/04/22.      Assessment & Plan:   Problem List Items Addressed This Visit   None Visit Diagnoses     Epigastric pain    -  Primary   Gastroesophageal reflux disease without esophagitis           No orders of the defined types were placed in this encounter.   No follow-ups on file.  Rubie Maid, FNP

## 2022-08-04 NOTE — Assessment & Plan Note (Addendum)
Based on patients mild symptoms that occur weekly and are precipitated by food and position I do believe she is experiencing a GERD flare up. She currently takes Omeprazole '20mg'$  daily for 4-8 weeks, will increase this to BID and see if she gets symptoms relief. I instructed her to seek medical care if she starts experiencing fevers, nights sweats, chills, severe abdominal pain, nausea, vomiting, or diarrhea.

## 2022-08-05 ENCOUNTER — Encounter: Payer: Medicare PPO | Admitting: Family Medicine

## 2022-08-09 ENCOUNTER — Telehealth: Payer: Self-pay | Admitting: Family Medicine

## 2022-08-09 NOTE — Telephone Encounter (Signed)
Left message for patient to call back and schedule Medicare Annual Wellness Visit (AWV) in office.   If not able to come in office, please offer to do virtually or by telephone.   Last AWV: 07/23/2021   Please schedule at anytime with [facility] [address]  If any questions, please contact me at [phone].  Thank you ,  Colletta [place]

## 2022-08-27 ENCOUNTER — Ambulatory Visit (HOSPITAL_COMMUNITY)
Admission: RE | Admit: 2022-08-27 | Discharge: 2022-08-27 | Disposition: A | Discharge status: Home / Self Care | Payer: Medicare PPO | Source: Ambulatory Visit | Attending: Family Medicine | Admitting: Family Medicine

## 2022-08-27 ENCOUNTER — Ambulatory Visit: Payer: Medicare PPO | Admitting: Family Medicine

## 2022-08-27 ENCOUNTER — Encounter: Payer: Self-pay | Admitting: Family Medicine

## 2022-08-27 VITALS — BP 150/92 | HR 87 | Wt 160.0 lb

## 2022-08-27 DIAGNOSIS — R0602 Shortness of breath: Secondary | ICD-10-CM | POA: Diagnosis not present

## 2022-08-27 MED ORDER — FLUTICASONE-SALMETEROL 115-21 MCG/ACT IN AERO: 2.0000 | INHALATION_SPRAY | Freq: Two times a day (BID) | RESPIRATORY_TRACT | 12 refills | Status: AC

## 2022-08-27 MED ORDER — HYDROCHLOROTHIAZIDE 25 MG PO TABS: 25.0000 mg | ORAL_TABLET | Freq: Every day | ORAL | 3 refills | Status: DC

## 2022-08-27 NOTE — Progress Notes (Signed)
Subjective:    Patient ID: Carolyn Glass, female    DOB: 1956/04/15, 66 y.o.   MRN: [id number]   Patient is a very pleasant 66 year old Caucasian female who presents today for high blood pressure.  She states that recently her systolic blood pressure has been fluctuating between 150-160.  She also states that her asthma has been worse recently.  She blames this on her husband who is smoking in the home and has a new dog.  However she states that she is using the albuterol more and its "not helping.  When I asked her to expound upon this she reports feeling short of breath at night, having trouble sleeping when she lies down and generally shortness of breath with activity.  She also reports feeling tightness in her airways and in her chest.  On examination today however her lungs are clear.  There are some faint bibasilar crackles.  She also has gained considerable classmates.  Weight is up that had.  She has some pitting edema in her extremities.  All this makes me concerned about pulmonary edema and fluid retention. Past Medical History:  Diagnosis Date   Anxiety    Arthritis    OA hips   Asthma    allergy and exercise induced asthma    Depression    Diabetes mellitus without complication ([place])    Family history of breast cancer    Family history of colon cancer    Family history of stomach cancer    GERD (gastroesophageal reflux disease)    Hypertension    Malignant neoplasm of lower-outer quadrant of right female breast ([place]) 06/21/2016   Seasonal allergies    Past Surgical History:  Procedure Laterality Date   BREAST LUMPECTOMY     right    BREAST RECONSTRUCTION WITH PLACEMENT OF TISSUE EXPANDER AND FLEX HD (ACELLULAR HYDRATED DERMIS) Bilateral 08/13/2016   Procedure: BREAST RECONSTRUCTION WITH PLACEMENT OF TISSUE EXPANDER AND ALLODERM;  Surgeon: Irene Limbo, MD;  Location: [facility];  Service: Plastics;  Laterality: Bilateral;   COLONOSCOPY N/A 10/31/2017    Procedure: COLONOSCOPY;  Surgeon: Danie Binder, MD;  [facility];  Service: Endoscopy;  Laterality: N/A;  8:30 Am   DILATION AND CURETTAGE OF UTERUS     JOINT REPLACEMENT     LIPOSUCTION WITH LIPOFILLING Bilateral 11/05/2016   Procedure: LIPOFILLING FROM ABDOMEN TO BILATERAL CHEST;  Surgeon: Irene Limbo, MD;  Location: [place];  Service: Plastics;  Laterality: Bilateral;   MASTECTOMY W/ SENTINEL NODE BIOPSY Bilateral 08/13/2016   Procedure: BILATERAL TOTAL MASTECTOMY WITH RIGHT AXILLARY SENTINEL LYMPH NODE BIOPSY;  Surgeon: [name], MD;  Location: [place];  Service: General;  Laterality: Bilateral;   REMOVAL OF BILATERAL TISSUE EXPANDERS WITH PLACEMENT OF BILATERAL BREAST IMPLANTS Bilateral 11/05/2016   Procedure: REMOVAL OF BILATERAL TISSUE EXPANDERS WITH PLACEMENT OF BILATERAL BREAST SALINE IMPLANTS;  Surgeon: Irene Limbo, MD;  Location: [place];  Service: Plastics;  Laterality: Bilateral;   TOTAL HIP ARTHROPLASTY Right 01/07/2016   Procedure: TOTAL HIP ARTHROPLASTY ANTERIOR APPROACH;  Surgeon: Gaynelle Arabian, MD;  [facility];  Service: Orthopedics;  Laterality: Right;   TOTAL HIP ARTHROPLASTY Left 03/22/2018   Procedure: LEFT TOTAL HIP ARTHROPLASTY ANTERIOR APPROACH;  Surgeon: Gaynelle Arabian, MD;  [facility];  Service: Orthopedics;  Laterality: Left;   Current Outpatient Medications on File Prior to Visit  Medication Sig Dispense Refill   albuterol (VENTOLIN HFA) 108 (90 Base) MCG/ACT  inhaler INHALE 2 PUFFS EVERY 6 HOURS AS NEEDED FOR SHORTNESS OF BREATH OR WHEEZING. 18 g 0   benazepril (LOTENSIN) 20 MG tablet TAKE (1) TABLET BY MOUTH TWICE DAILY. 180 tablet 3   celecoxib (CELEBREX) 200 MG capsule TAKE (1) CAPSULE BY MOUTH TWICE DAILY. 30 capsule 11   escitalopram (LEXAPRO) 20 MG tablet TAKE ONE TABLET BY MOUTH ONCE DAILY. 90 tablet 3   fexofenadine (ALLEGRA) 60 MG tablet Take 60 mg by mouth 2 (two)  times daily.     JANUVIA 100 MG tablet TAKE (1) TABLET BY MOUTH ONCE DAILY. 90 tablet 3   metFORMIN (GLUCOPHAGE) 1000 MG tablet TAKE 1 TABLET BY MOUTH TWICE DAILY WITH MEALS. 180 tablet 0   omeprazole (PRILOSEC) 20 MG capsule TAKE (1) CAPSULE BY MOUTH ONCE DAILY. 90 capsule 0   tamoxifen (NOLVADEX) 20 MG tablet Take 1 tablet (20 mg total) by mouth daily. 90 tablet 1   tiZANidine (ZANAFLEX) 4 MG tablet Take 1 tablet (4 mg total) by mouth every 6 (six) hours as needed for muscle spasms. Do not take while driving or operating heavy machinery. 30 tablet 0   No current facility-administered medications on file prior to visit.   Allergies  Allergen Reactions   Corn-Containing Products Itching    Running nose   Tomato Itching    Running nose    Azithromycin Itching and Rash   Biaxin [Clarithromycin] Rash   Codeine Rash    Thinks a codeine cough syrup caused rash.   Social History   Socioeconomic History   Marital status: Married    Spouse name: Carolyn Glass   Number of children: 1   Years of education: Not on file   Highest education level: Not on file  Occupational History   Not on file  Tobacco Use   Smoking status: Never   Smokeless tobacco: Never  Vaping Use   Vaping Use: Never used  Substance and Sexual Activity   Alcohol use: No   Drug use: No   Sexual activity: Yes  Other Topics Concern   Not on file  Social History Narrative   Married x 37 years in 2022.   1 daughter Carolyn Glass   1 granddaughter, Carolyn Glass   Social Determinants of Health   Financial Resource Strain: Low Risk  (07/23/2021)   Overall Financial Resource Strain (CARDIA)    Difficulty of Paying Living Expenses: Not hard at all  Food Insecurity: No Food Insecurity (07/23/2021)   Hunger Vital Sign    Worried About Running Out of Food in the Last Year: Never true    [place] in the Last Year: Never true  Transportation Needs: No Transportation Needs (07/23/2021)   PRAPARE - Radiographer, therapeutic (Medical): No    Lack of Transportation (Non-Medical): No  Physical Activity: Insufficiently Active (07/23/2021)   Exercise Vital Sign    Days of Exercise per Week: 3 days    Minutes of Exercise per Session: 20 min  Stress: No Stress Concern Present (07/23/2021)   [place]    Feeling of Stress : Only a little  Social Connections: Socially Integrated (07/23/2021)   Social Connection and Isolation Panel [NHANES]    Frequency of Communication with Friends and Family: More than three times a week    Frequency of Social Gatherings with Friends and Family: More than three times a week    Attends Religious Services: More than 4 times per year  Active Member of Clubs or Organizations: Yes    Attends Archivist Meetings: More than 4 times per year    Marital Status: Married  Human resources officer Violence: Not At Risk (07/23/2021)   Humiliation, Afraid, Rape, and Kick questionnaire    Fear of Current or Ex-Partner: No    Emotionally Abused: No    Physically Abused: No    Sexually Abused: No   Family History  Problem Relation Age of Onset   Arthritis Mother    Asthma Mother    Diabetes Mother    Hyperlipidemia Mother    Hypertension Mother    Arthritis Father    Asthma Father    Stroke Father    Hyperlipidemia Father    Early death Brother 43       Muscular Dystrophy   Birth defects Brother    Cancer Maternal Uncle        possibly stomach   Mental retardation Maternal Uncle    Cancer Paternal Grandmother 59       breast cancer    Cancer Cousin 4       paternal cousin's daughter with breast cancer   Colon cancer Maternal Aunt        dx in her 62s   Leukemia Paternal Uncle    Lymphoma Paternal Uncle    Down syndrome Paternal Uncle       Review of Systems  All other systems reviewed and are negative.      Objective:   Physical Exam Vitals reviewed.  Constitutional:      General:  She is not in acute distress.    Appearance: She is well-developed. She is not diaphoretic.  Cardiovascular:     Rate and Rhythm: Normal rate and regular rhythm.     Heart sounds: Normal heart sounds. No murmur heard.    No friction rub. No gallop.  Pulmonary:     Effort: Pulmonary effort is normal. No respiratory distress.     Breath sounds: Rales present. No wheezing.  Chest:     Chest wall: No tenderness.  Musculoskeletal:        General: No tenderness or deformity. Normal range of motion.     Cervical back: Normal range of motion and neck supple.     Right lower leg: Edema present.     Left lower leg: Edema present.  Skin:    General: Skin is warm.     Coloration: Skin is not pale.     Findings: No erythema or rash.     Wt Readings from Last 3 Encounters:  08/04/22 153 lb (69.4 kg)  06/17/22 162 lb 8 oz (73.7 kg)  03/29/22 164 lb (74.4 kg)         Assessment & Plan:  Shortness of breath - Plan: DG Chest 2 View, Hemoglobin K5L, BASIC METABOLIC PANEL WITH GFR, Brain natriuretic peptide Patient's blood pressure is certainly elevated.  I will add hydrochlorothiazide to her benazepril.  She will take 25 mg a day to lower her blood pressure.  However I am not convinced that his asthma causing her shortness of breath.  She has orthopnea.  She has edema in her extremities and her lungs are now clear on exam.  Therefore we will send the patient for chest x-ray to evaluate for pulmonary edema.  I am also going to check a BNP.  If her BNP is elevated and the chest x-ray shows pulmonary edema, she will need cardiology consultation for evaluation for heart failure.  She certainly has underlying risk factors including hypertension, diabetes, and age.  I instructed the patient that if her shortness of breath worsens or she develops chest pain she is to go the emergency room immediately.  We will also try Advair 2 puffs twice daily to see if this helps with the shortness of breath while we are  awaiting the results of her x-ray and BNP

## 2022-08-28 LAB — BASIC METABOLIC PANEL WITH GFR
BUN: 10 mg/dL (ref 7–25)
CO2: 29 mmol/L (ref 20–32)
Calcium: 8.3 mg/dL — ABNORMAL LOW (ref 8.6–10.4)
Chloride: 102 mmol/L (ref 98–110)
Creat: 0.51 mg/dL (ref 0.50–1.05)
Glucose, Bld: 81 mg/dL (ref 65–99)
Potassium: 3.7 mmol/L (ref 3.5–5.3)
Sodium: 141 mmol/L (ref 135–146)
eGFR: 102 mL/min/{1.73_m2} (ref >=60)

## 2022-08-28 LAB — BRAIN NATRIURETIC PEPTIDE: Brain Natriuretic Peptide: 20 pg/mL (ref <100)

## 2022-08-28 LAB — HEMOGLOBIN A1C
Hgb A1c MFr Bld: 7.1 % of total Hgb — ABNORMAL HIGH (ref <5.7)
Mean Plasma Glucose: 157 mg/dL
eAG (mmol/L): 8.7 mmol/L

## 2022-09-01 ENCOUNTER — Encounter: Payer: Self-pay | Admitting: Family Medicine

## 2022-09-01 ENCOUNTER — Ambulatory Visit (INDEPENDENT_AMBULATORY_CARE_PROVIDER_SITE_OTHER): Payer: Medicare PPO | Admitting: Family Medicine

## 2022-09-01 VITALS — BP 130/74 | HR 98 | Temp 98.3°F | Resp 16 | Ht 63.0 in | Wt 156.0 lb

## 2022-09-01 DIAGNOSIS — J209 Acute bronchitis, unspecified: Secondary | ICD-10-CM | POA: Diagnosis not present

## 2022-09-01 DIAGNOSIS — J45901 Unspecified asthma with (acute) exacerbation: Secondary | ICD-10-CM | POA: Diagnosis not present

## 2022-09-01 MED ORDER — PREDNISONE 10 MG PO TABS: 60.0000 mg | ORAL_TABLET | Freq: Every day | ORAL | 0 refills | Status: AC

## 2022-09-01 MED ORDER — ALBUTEROL SULFATE HFA 108 (90 BASE) MCG/ACT IN AERS: INHALATION_SPRAY | RESPIRATORY_TRACT | 0 refills | Status: DC

## 2022-09-01 MED ORDER — METHYLPREDNISOLONE ACETATE 80 MG/ML IJ SUSP: 60.0000 mg | Freq: Once | INTRAMUSCULAR | Status: DC

## 2022-09-01 NOTE — Progress Notes (Signed)
Acute Office Visit  Subjective:     Patient ID: Carolyn Glass, female    DOB: [birth year], 66 y.o.   MRN: [id number]  Chief Complaint  Patient presents with   Acute Visit    bronchitis? coughing a lot, laryngitis advancing; sleeping a lot today.       HPI Patient is in today for a couple weeks of shortness of breath, hoarse voice, green/yellow purulent cough, intermittent fever, congestion Denies sinus pressure, headache, rhinorrhea, N/V/D Has tried mucinex, Advair, ran out of Albuterol Negative home covid last week.   Review of Systems  All other systems reviewed and are negative.   Past Medical History:  Diagnosis Date   Anxiety    Arthritis    OA hips   Asthma    allergy and exercise induced asthma    Depression    Diabetes mellitus without complication ([place])    Family history of breast cancer    Family history of colon cancer    Family history of stomach cancer    GERD (gastroesophageal reflux disease)    Hypertension    Malignant neoplasm of lower-outer quadrant of right female breast ([place]) 06/21/2016   Seasonal allergies    Past Surgical History:  Procedure Laterality Date   BREAST LUMPECTOMY     right    BREAST RECONSTRUCTION WITH PLACEMENT OF TISSUE EXPANDER AND FLEX HD (ACELLULAR HYDRATED DERMIS) Bilateral 08/13/2016   Procedure: BREAST RECONSTRUCTION WITH PLACEMENT OF TISSUE EXPANDER AND ALLODERM;  Surgeon: Irene Limbo, MD;  Location: [place];  Service: Plastics;  Laterality: Bilateral;   COLONOSCOPY N/A 10/31/2017   Procedure: COLONOSCOPY;  Surgeon: Danie Binder, MD;  [facility];  Service: Endoscopy;  Laterality: N/A;  8:30 Am   DILATION AND CURETTAGE OF UTERUS     JOINT REPLACEMENT     LIPOSUCTION WITH LIPOFILLING Bilateral 11/05/2016   Procedure: LIPOFILLING FROM ABDOMEN TO BILATERAL CHEST;  Surgeon: Irene Limbo, MD;  Location: [place];  Service: Plastics;  Laterality: Bilateral;    MASTECTOMY W/ SENTINEL NODE BIOPSY Bilateral 08/13/2016   Procedure: BILATERAL TOTAL MASTECTOMY WITH RIGHT AXILLARY SENTINEL LYMPH NODE BIOPSY;  Surgeon: [name], MD;  Location: [place];  Service: General;  Laterality: Bilateral;   REMOVAL OF BILATERAL TISSUE EXPANDERS WITH PLACEMENT OF BILATERAL BREAST IMPLANTS Bilateral 11/05/2016   Procedure: REMOVAL OF BILATERAL TISSUE EXPANDERS WITH PLACEMENT OF BILATERAL BREAST SALINE IMPLANTS;  Surgeon: Irene Limbo, MD;  Location: [place];  Service: Plastics;  Laterality: Bilateral;   TOTAL HIP ARTHROPLASTY Right 01/07/2016   Procedure: TOTAL HIP ARTHROPLASTY ANTERIOR APPROACH;  Surgeon: Gaynelle Arabian, MD;  [facility];  Service: Orthopedics;  Laterality: Right;   TOTAL HIP ARTHROPLASTY Left 03/22/2018   Procedure: LEFT TOTAL HIP ARTHROPLASTY ANTERIOR APPROACH;  Surgeon: Gaynelle Arabian, MD;  [facility];  Service: Orthopedics;  Laterality: Left;   Current Outpatient Medications on File Prior to Visit  Medication Sig Dispense Refill   benazepril (LOTENSIN) 20 MG tablet TAKE (1) TABLET BY MOUTH TWICE DAILY. 180 tablet 3   celecoxib (CELEBREX) 200 MG capsule TAKE (1) CAPSULE BY MOUTH TWICE DAILY. 30 capsule 11   escitalopram (LEXAPRO) 20 MG tablet TAKE ONE TABLET BY MOUTH ONCE DAILY. 90 tablet 3   fexofenadine (ALLEGRA) 60 MG tablet Take 60 mg by mouth 2 (two) times daily.     fluticasone-salmeterol (ADVAIR HFA) 115-21 MCG/ACT inhaler Inhale 2 puffs into the lungs 2 (two) times daily. 1  each 12   hydrochlorothiazide (HYDRODIURIL) 25 MG tablet Take 1 tablet (25 mg total) by mouth daily. 90 tablet 3   JANUVIA 100 MG tablet TAKE (1) TABLET BY MOUTH ONCE DAILY. 90 tablet 3   metFORMIN (GLUCOPHAGE) 1000 MG tablet TAKE 1 TABLET BY MOUTH TWICE DAILY WITH MEALS. 180 tablet 0   omeprazole (PRILOSEC) 20 MG capsule TAKE (1) CAPSULE BY MOUTH ONCE DAILY. 90 capsule 0   tamoxifen (NOLVADEX) 20 MG tablet Take 1 tablet  (20 mg total) by mouth daily. 90 tablet 1   No current facility-administered medications on file prior to visit.   Allergies  Allergen Reactions   Corn-Containing Products Itching    Running nose   Tomato Itching    Running nose    Azithromycin Itching and Rash   Biaxin [Clarithromycin] Rash   Codeine Rash    Thinks a codeine cough syrup caused rash.        Objective:    BP 130/74   Pulse 98   Temp 98.3 F (36.8 C) (Oral)   Resp 16   Ht '5\' 3"'$  (1.6 m)   Wt 156 lb (70.8 kg)   SpO2 96%   BMI 27.63 kg/m    Physical Exam Vitals and nursing note reviewed.  Constitutional:      Appearance: Normal appearance. She is normal weight.  HENT:     Head: Normocephalic and atraumatic.  Cardiovascular:     Rate and Rhythm: Normal rate and regular rhythm.     Pulses: Normal pulses.     Heart sounds: Normal heart sounds.  Pulmonary:     Effort: Pulmonary effort is normal. Tachypnea present.     Breath sounds: Examination of the right-upper field reveals wheezing. Examination of the left-upper field reveals wheezing. Examination of the right-lower field reveals wheezing. Examination of the left-lower field reveals wheezing. Wheezing present.  Skin:    General: Skin is warm and dry.  Neurological:     General: No focal deficit present.     Mental Status: She is alert and oriented to person, place, and time. Mental status is at baseline.  Psychiatric:        Mood and Affect: Mood normal.        Behavior: Behavior normal.        Thought Content: Thought content normal.        Judgment: Judgment normal.     No results found for any visits on 09/01/22.      Assessment & Plan:   Problem List Items Addressed This Visit       Respiratory   Acute bronchitis with asthma with acute exacerbation - Primary    Patient presented today with acute asthma with wheezing and shortness of breath, oxygen saturation between 87-91%. She reports using her Advair as prescribed but does not  have Albuterol for use. She is wheezing throughout on auscultation, does not appear in distress. Albuterol nebulizer and Depo-Medrol '60mg'$  injection given in office. Albuterol refilled and instructed to use PRN every 6 hours 2 puffs for SOB and wheezing. Start Prednisone '60mg'$  daily tomorrow evening for 5 days. Presentation improved prior to leaving office, lung sounds clear to auscultation, oxygen saturation 94-96%. Instructed to return to office if symptoms persist or worsen and to seek emergency medical care if shortness of breath and wheezing not improved with albuterol at home.      Relevant Medications   albuterol (VENTOLIN HFA) 108 (90 Base) MCG/ACT inhaler   predniSONE (DELTASONE) 10 MG tablet  methylPREDNISolone acetate (DEPO-MEDROL) injection 60 mg (Start on 09/01/2022  5:00 PM)    Meds ordered this encounter  Medications   albuterol (VENTOLIN HFA) 108 (90 Base) MCG/ACT inhaler    Sig: INHALE 2 PUFFS EVERY 6 HOURS AS NEEDED FOR SHORTNESS OF BREATH OR WHEEZING.    Dispense:  18 g    Refill:  0    Order Specific Question:   Supervising Provider    Answer:   Jenna Luo T [[id number]]   predniSONE (DELTASONE) 10 MG tablet    Sig: Take 6 tablets (60 mg total) by mouth daily for 5 days. One tablet daily for 5 days with one '10mg'$  tablet daily for 5 days    Dispense:  5 tablet    Refill:  0    Order Specific Question:   Supervising Provider    Answer:   Jenna Luo T [[id number]]   methylPREDNISolone acetate (DEPO-MEDROL) injection 60 mg    Return if symptoms worsen or fail to improve.  Rubie Maid, FNP

## 2022-09-01 NOTE — Assessment & Plan Note (Signed)
Patient presented today with acute asthma with wheezing and shortness of breath, oxygen saturation between 87-91%. She reports using her Advair as prescribed but does not have Albuterol for use. She is wheezing throughout on auscultation, does not appear in distress. Albuterol nebulizer and Depo-Medrol '60mg'$  injection given in office. Albuterol refilled and instructed to use PRN every 6 hours 2 puffs for SOB and wheezing. Start Prednisone '60mg'$  daily tomorrow evening for 5 days. Presentation improved prior to leaving office, lung sounds clear to auscultation, oxygen saturation 94-96%. Instructed to return to office if symptoms persist or worsen and to seek emergency medical care if shortness of breath and wheezing not improved with albuterol at home.

## 2022-09-09 ENCOUNTER — Other Ambulatory Visit: Payer: Self-pay | Admitting: Family Medicine

## 2022-09-09 NOTE — Telephone Encounter (Signed)
Requested Prescriptions  Pending Prescriptions Disp Refills   metFORMIN (GLUCOPHAGE) 1000 MG tablet [Pharmacy Med Name: METFORMIN HCL 1,000 MG TABLET] 180 tablet 0    Sig: TAKE 1 TABLET BY MOUTH TWICE DAILY WITH MEALS.     Endocrinology:  Diabetes - Biguanides Failed - 09/09/2022  9:35 AM      Failed - Valid encounter within last 6 months    Recent Outpatient Visits           11 months ago Strain of neck muscle, initial encounter   [place] Eulogio Bear, NP   1 year ago Acute left ankle pain   [place] Schaumann, Cammie Mcgee, MD   1 year ago Sebaceous cyst   [place] [name], MD   2 years ago Upper respiratory tract infection, unspecified type   [place] [name], MD   2 years ago Idiopathic peripheral neuropathy   [place] Pickard, Cammie Mcgee, MD              Passed - Cr in normal range and within 360 days    Creatinine  Date Value Ref Range Status  06/13/2017 0.8 0.6 - 1.1 mg/dL Final   Creat  Date Value Ref Range Status  08/27/2022 0.51 0.50 - 1.05 mg/dL Final   Creatinine, Urine  Date Value Ref Range Status  07/23/2021 80 20 - 275 mg/dL Final         Passed - HBA1C is between 0 and 7.9 and within 180 days    Hgb A1c MFr Bld  Date Value Ref Range Status  08/27/2022 7.1 (H) <5.7 % of total Hgb Final    Comment:    For someone without known diabetes, a hemoglobin A1c value of 6.5% or greater indicates that they may have  diabetes and this should be confirmed with a follow-up  test. . For someone with known diabetes, a value <7% indicates  that their diabetes is well controlled and a value  greater than or equal to 7% indicates suboptimal  control. A1c targets should be individualized based on  duration of diabetes, age, comorbid conditions, and  other considerations. . Currently, no consensus exists regarding use of hemoglobin A1c for  diagnosis of diabetes for children. .          Passed - eGFR in normal range and within 360 days    GFR, Est African American  Date Value Ref Range Status  02/19/2020 108 > OR = 60 mL/min/1.59m Final   GFR, Est Non African American  Date Value Ref Range Status  02/19/2020 93 > OR = 60 mL/min/1.77mFinal   GFR, Estimated  Date Value Ref Range Status  06/17/2022 >60 >60 mL/min Final    Comment:    (NOTE) Calculated using the CKD-EPI Creatinine Equation (2021)   06/17/2021 >60 >60 mL/min Final    Comment:    (NOTE) Calculated using the CKD-EPI Creatinine Equation (2021)    eGFR  Date Value Ref Range Status  08/27/2022 102 > OR = 60 mL/min/1.7356minal         Passed - B12 Level in normal range and within 720 days    Vitamin B-12  Date Value Ref Range Status  07/23/2021 383 200 - 1,100 pg/mL Final    Comment:    . Please Note: Although the reference range for vitamin B12 is [phone] pg/mL, it has been reported that between 5 and  10% of patients with values between 200 and 400 pg/mL may experience neuropsychiatric and hematologic abnormalities due to occult B12 deficiency; less than 1% of patients with values above 400 pg/mL will have symptoms. .          Passed - CBC within normal limits and completed in the last 12 months    WBC  Date Value Ref Range Status  06/17/2022 6.8 4.0 - 10.5 K/uL Final   RBC  Date Value Ref Range Status  06/17/2022 4.10 3.87 - 5.11 MIL/uL Final   Hemoglobin  Date Value Ref Range Status  06/17/2022 13.2 12.0 - 15.0 g/dL Final   HGB  Date Value Ref Range Status  06/13/2017 12.8 11.6 - 15.9 g/dL Final   HCT  Date Value Ref Range Status  06/17/2022 39.3 36.0 - 46.0 % Final  06/13/2017 38.5 34.8 - 46.6 % Final   MCHC  Date Value Ref Range Status  06/17/2022 33.6 30.0 - 36.0 g/dL Final   [facility]  Date Value Ref Range Status  06/17/2022 32.2 26.0 - 34.0 pg Final   MCV  Date Value Ref Range Status  06/17/2022 95.9 80.0 -  100.0 fL Final  06/13/2017 95.2 79.5 - 101.0 fL Final   No results found for: "PLTCOUNTKUC", "LABPLAT", "POCPLA" RDW  Date Value Ref Range Status  06/17/2022 13.0 11.5 - 15.5 % Final  06/13/2017 13.8 11.2 - 14.5 % Final

## 2022-09-16 ENCOUNTER — Encounter: Payer: Self-pay | Admitting: Gastroenterology

## 2022-09-16 ENCOUNTER — Other Ambulatory Visit: Payer: Self-pay | Admitting: Family Medicine

## 2022-09-16 DIAGNOSIS — K219 Gastro-esophageal reflux disease without esophagitis: Secondary | ICD-10-CM

## 2022-09-16 NOTE — Telephone Encounter (Signed)
Requested Prescriptions  Pending Prescriptions Disp Refills   omeprazole (PRILOSEC) 20 MG capsule [Pharmacy Med Name: OMEPRAZOLE 20 MG CAPSULE DR] 90 capsule 0    Sig: TAKE (1) CAPSULE BY MOUTH ONCE DAILY.     Gastroenterology: Proton Pump Inhibitors Failed - 09/16/2022 10:26 AM      Failed - Valid encounter within last 12 months    Recent Outpatient Visits           1 year ago Strain of neck muscle, initial encounter   [place] Eulogio Bear, NP   1 year ago Acute left ankle pain   [place] Dennard Schaumann, Cammie Mcgee, MD   1 year ago Sebaceous cyst   [facility] [place] [name], MD   2 years ago Upper respiratory tract infection, unspecified type   [place] [name], MD   2 years ago Idiopathic peripheral neuropathy   [place] Pickard, Cammie Mcgee, MD

## 2022-09-21 ENCOUNTER — Other Ambulatory Visit: Payer: Self-pay

## 2022-09-21 DIAGNOSIS — K219 Gastro-esophageal reflux disease without esophagitis: Secondary | ICD-10-CM

## 2022-09-21 MED ORDER — OMEPRAZOLE 20 MG PO CPDR: 20.0000 mg | DELAYED_RELEASE_CAPSULE | Freq: Two times a day (BID) | ORAL | 3 refills | Status: DC

## 2022-09-30 ENCOUNTER — Other Ambulatory Visit: Payer: Self-pay | Admitting: Family Medicine

## 2022-10-05 ENCOUNTER — Other Ambulatory Visit: Payer: Self-pay

## 2022-10-05 ENCOUNTER — Telehealth: Payer: Self-pay

## 2022-10-05 ENCOUNTER — Other Ambulatory Visit: Payer: Self-pay | Admitting: Family Medicine

## 2022-10-05 DIAGNOSIS — F32A Depression, unspecified: Secondary | ICD-10-CM

## 2022-10-05 MED ORDER — ESCITALOPRAM OXALATE 20 MG PO TABS: 20.0000 mg | ORAL_TABLET | Freq: Every day | ORAL | 3 refills | Status: DC

## 2022-10-05 NOTE — Telephone Encounter (Signed)
Prescription Request  10/05/2022  Is this a "Controlled Substance" medicine? No  LOV: 09/01/22  What is the name of the medication or equipment? escitalopram (LEXAPRO) 20 MG tablet [[id number]]  Have you contacted your pharmacy to request a refill? Yes   Which pharmacy would you like this sent to?  [facility] [place] [address] [place] [address] Phone: [phone] Fax: [phone]    Patient notified that their request is being sent to the clinical staff for review and that they should receive a response within 2 business days.   Please advise at [facility] [phone]

## 2022-10-12 ENCOUNTER — Ambulatory Visit (INDEPENDENT_AMBULATORY_CARE_PROVIDER_SITE_OTHER): Payer: Medicare PPO

## 2022-10-12 VITALS — Ht 63.0 in | Wt 153.0 lb

## 2022-10-12 DIAGNOSIS — Z0000 Encounter for general adult medical examination without abnormal findings: Secondary | ICD-10-CM | POA: Diagnosis not present

## 2022-10-12 NOTE — Progress Notes (Signed)
Subjective:   Carolyn Glass is a 67 y.o. female who presents for Medicare Annual (Subsequent) preventive examination.  I connected with  Carolyn Glass on 10/12/22 by a audio enabled telemedicine application and verified that I am speaking with the correct person using two identifiers.  Patient Location: Home  Provider Location: Office/Clinic  I discussed the limitations of evaluation and management by telemedicine. The patient expressed understanding and agreed to proceed.  Review of Systems     Cardiac Risk Factors include: advanced age (>10mn, >>15women);diabetes mellitus     Objective:    Today's Vitals   10/12/22 1616  Weight: 153 lb (69.4 kg)  Height: '5\' 3"'$  (1.6 m)   Body mass index is 27.1 kg/m.     10/12/2022    4:18 PM 07/23/2021    8:33 AM 10/17/2019    2:33 PM 03/22/2018    3:42 PM 03/13/2018    1:37 PM 12/12/2017    2:28 PM 10/31/2017    7:50 AM  Advanced Directives  Does Patient Have a Medical Advance Directive? No Yes No No No No No  Type of ASocial research officer, governmentLiving will       Copy of [place] Chart?  No - copy requested       Would patient like information on creating a medical advance directive? No - Patient declined  No - Patient declined No - Patient declined No - Patient declined No - Patient declined No - Patient declined    Current Medications (verified) Outpatient Encounter Medications as of 10/12/2022  Medication Sig   albuterol (VENTOLIN HFA) 108 (90 Base) MCG/ACT inhaler INHALE 2 PUFFS EVERY 6 HOURS AS NEEDED FOR SHORTNESS OF BREATH OR WHEEZING.   benazepril (LOTENSIN) 20 MG tablet TAKE (1) TABLET BY MOUTH TWICE DAILY.   celecoxib (CELEBREX) 200 MG capsule TAKE (1) CAPSULE BY MOUTH TWICE DAILY.   escitalopram (LEXAPRO) 20 MG tablet Take 1 tablet (20 mg total) by mouth daily.   fexofenadine (ALLEGRA) 60 MG tablet Take 60 mg by mouth 2 (two) times daily.   fluticasone-salmeterol (ADVAIR HFA)  115-21 MCG/ACT inhaler Inhale 2 puffs into the lungs 2 (two) times daily.   hydrochlorothiazide (HYDRODIURIL) 25 MG tablet Take 1 tablet (25 mg total) by mouth daily.   JANUVIA 100 MG tablet TAKE (1) TABLET BY MOUTH ONCE DAILY.   metFORMIN (GLUCOPHAGE) 1000 MG tablet TAKE 1 TABLET BY MOUTH TWICE DAILY WITH MEALS.   omeprazole (PRILOSEC) 20 MG capsule Take 1 capsule (20 mg total) by mouth 2 (two) times daily before a meal.   tamoxifen (NOLVADEX) 20 MG tablet Take 1 tablet (20 mg total) by mouth daily.   Facility-Administered Encounter Medications as of 10/12/2022  Medication   methylPREDNISolone acetate (DEPO-MEDROL) injection 60 mg    Allergies (verified) Corn-containing products, Tomato, Azithromycin, Biaxin [clarithromycin], and Codeine   History: Past Medical History:  Diagnosis Date   Anxiety    Arthritis    OA hips   Asthma    allergy and exercise induced asthma    Depression    Diabetes mellitus without complication ([place]    Family history of breast cancer    Family history of colon cancer    Family history of stomach cancer    GERD (gastroesophageal reflux disease)    Hypertension    Malignant neoplasm of lower-outer quadrant of right female breast ([place] 06/21/2016   Seasonal allergies    Past Surgical History:  Procedure  Laterality Date   BREAST LUMPECTOMY     right    BREAST RECONSTRUCTION WITH PLACEMENT OF TISSUE EXPANDER AND FLEX HD (ACELLULAR HYDRATED DERMIS) Bilateral 08/13/2016   Procedure: BREAST RECONSTRUCTION WITH PLACEMENT OF TISSUE EXPANDER AND ALLODERM;  Surgeon: Irene Limbo, MD;  Location: [place];  Service: Plastics;  Laterality: Bilateral;   COLONOSCOPY N/A 10/31/2017   Procedure: COLONOSCOPY;  Surgeon: Danie Binder, MD;  [facility];  Service: Endoscopy;  Laterality: N/A;  8:30 Am   DILATION AND CURETTAGE OF UTERUS     JOINT REPLACEMENT     LIPOSUCTION WITH LIPOFILLING Bilateral 11/05/2016   Procedure: LIPOFILLING  FROM ABDOMEN TO BILATERAL CHEST;  Surgeon: Irene Limbo, MD;  Location: [place];  Service: Plastics;  Laterality: Bilateral;   MASTECTOMY W/ SENTINEL NODE BIOPSY Bilateral 08/13/2016   Procedure: BILATERAL TOTAL MASTECTOMY WITH RIGHT AXILLARY SENTINEL LYMPH NODE BIOPSY;  Surgeon: [name], MD;  Location: [place];  Service: General;  Laterality: Bilateral;   REMOVAL OF BILATERAL TISSUE EXPANDERS WITH PLACEMENT OF BILATERAL BREAST IMPLANTS Bilateral 11/05/2016   Procedure: REMOVAL OF BILATERAL TISSUE EXPANDERS WITH PLACEMENT OF BILATERAL BREAST SALINE IMPLANTS;  Surgeon: Irene Limbo, MD;  Location: [place];  Service: Plastics;  Laterality: Bilateral;   TOTAL HIP ARTHROPLASTY Right 01/07/2016   Procedure: TOTAL HIP ARTHROPLASTY ANTERIOR APPROACH;  Surgeon: Gaynelle Arabian, MD;  Location[facility];  Service: Orthopedics;  Laterality: Right;   TOTAL HIP ARTHROPLASTY Left 03/22/2018   Procedure: LEFT TOTAL HIP ARTHROPLASTY ANTERIOR APPROACH;  Surgeon: Gaynelle Arabian, MD;  Location[facility];  Service: Orthopedics;  Laterality: Left;   Family History  Problem Relation Age of Onset   Arthritis Mother    Asthma Mother    Diabetes Mother    Hyperlipidemia Mother    Hypertension Mother    Arthritis Father    Asthma Father    Stroke Father    Hyperlipidemia Father    Early death Brother 63       Muscular Dystrophy   Birth defects Brother    Cancer Maternal Uncle        possibly stomach   Mental retardation Maternal Uncle    Cancer Paternal Grandmother 37       breast cancer    Cancer Cousin 86       paternal cousin's daughter with breast cancer   Colon cancer Maternal Aunt        dx in her 38s   Leukemia Paternal Uncle    Lymphoma Paternal Uncle    Down syndrome Paternal Uncle    Social History   Socioeconomic History   Marital status: Married    Spouse name: [name]   Number of children: 1   Years of education: Not on file    Highest education level: Not on file  Occupational History   Not on file  Tobacco Use   Smoking status: Never   Smokeless tobacco: Never  Vaping Use   Vaping Use: Never used  Substance and Sexual Activity   Alcohol use: No   Drug use: No   Sexual activity: Yes  Other Topics Concern   Not on file  Social History Narrative   Married x 37 years in 2022.   1 daughter Brooke   1 granddaughter, [name]   Social Determinants of Health   Financial Resource Strain: Low Risk  (10/12/2022)   Overall Financial Resource Strain (CARDIA)    Difficulty of Paying Living Expenses:  Not hard at all  Food Insecurity: No Food Insecurity (10/12/2022)   Hunger Vital Sign    Worried About Running Out of Food in the Last Year: Never true    Ran Out of Food in the Last Year: Never true  Transportation Needs: No Transportation Needs (10/12/2022)   PRAPARE - Hydrologist (Medical): No    Lack of Transportation (Non-Medical): No  Physical Activity: Sufficiently Active (10/12/2022)   Exercise Vital Sign    Days of Exercise per Week: 5 days    Minutes of Exercise per Session: 30 min  Stress: No Stress Concern Present (10/12/2022)   [place]    Feeling of Stress : Not at all  Social Connections: [place] (10/12/2022)   Social Connection and Isolation Panel [NHANES]    Frequency of Communication with Friends and Family: More than three times a week    Frequency of Social Gatherings with Friends and Family: Three times a week    Attends Religious Services: More than 4 times per year    Active Member of Clubs or Organizations: Yes    Attends Music therapist: More than 4 times per year    Marital Status: Married    Tobacco Counseling Counseling given: Not Answered   Clinical Intake:  Pre-visit preparation completed: Yes  Pain : No/denies pain  Diabetes: Yes CBG done?: No Did pt. bring  in CBG monitor from home?: No  How often do you need to have someone help you when you read instructions, pamphlets, or other written materials from your doctor or pharmacy?: 1 - Never  Diabetic?Yes  Nutrition Risk Assessment:  Has the patient had any N/V/D within the last 2 months?  No  Does the patient have any non-healing wounds?  No  Has the patient had any unintentional weight loss or weight gain?  No   Diabetes:  Is the patient diabetic?  Yes  If diabetic, was a CBG obtained today?  No  Did the patient bring in their glucometer from home?  No  How often do you monitor your CBG's? As needed .   Financial Strains and Diabetes Management:  Are you having any financial strains with the device, your supplies or your medication? No .  Does the patient want to be seen by Chronic Care Management for management of their diabetes?  No  Would the patient like to be referred to a Nutritionist or for Diabetic Management?  No   Diabetic Exams:  Diabetic Eye Exam: Completed with Dr. Jorja Loa; will request records  Diabetic Foot Exam: Overdue, Pt has been advised about the importance in completing this exam. Pt is scheduled for diabetic foot exam on at next office visit .   Interpreter Needed?: No  Information entered by :: Denman George LPN   Activities of Daily Living    10/12/2022    4:18 PM  In your present state of health, do you have any difficulty performing the following activities:  Hearing? 0  Vision? 0  Difficulty concentrating or making decisions? 0  Walking or climbing stairs? 0  Dressing or bathing? 0  Doing errands, shopping? 0  Preparing Food and eating ? N  Using the Toilet? N  In the past six months, have you accidently leaked urine? N  Do you have problems with loss of bowel control? N  Managing your Medications? N  Managing your Finances? N  Housekeeping or managing your Housekeeping?  N    Patient Care Team: [name], MD as PCP - General (Family  Medicine) [name], MD (Inactive) as Consulting Physician (General Surgery) [name], MD as Consulting Physician (Hematology) [name], MD as Attending Physician (Radiation Oncology) [name], NP as Nurse Practitioner (Hematology and Oncology) Irene Limbo, MD as Consulting Physician (Plastic Surgery)  Indicate any recent Medical Services you may have received from other than [facility] providers in the past year (date may be approximate).     Assessment:   This is a routine wellness examination for [place].  Hearing/Vision screen Hearing Screening - Comments:: Denies hearing difficulties  Vision Screening - Comments:: Wears rx glasses - up to date with routine eye exams with Dr. Jorja Loa    Dietary issues and exercise activities discussed: Current Exercise Habits: Home exercise routine, Type of exercise: walking, Time (Minutes): 30, Frequency (Times/Week): 5, Weekly Exercise (Minutes/Week): 150, Intensity: Mild   Goals Addressed   None    Depression Screen    10/12/2022    4:17 PM 08/27/2022   12:19 PM 08/04/2022    4:17 PM 07/23/2021    8:30 AM 06/13/2018   11:59 AM 05/10/2018   10:05 AM 11/03/2017    7:57 AM  PHQ 2/9 Scores  PHQ - 2 Score 0 0 0 1 0 0 2  PHQ- 9 Score  0 0    4    Fall Risk    10/12/2022    4:17 PM 08/27/2022   12:19 PM 08/04/2022    4:17 PM 07/23/2021    8:35 AM 06/13/2018   11:59 AM  Fall Risk   Falls in the past year? 0 0 0 0 No  Number falls in past yr: 0 0  0   Injury with Fall? 0 0  0   Risk for fall due to :  No Fall Risks  No Fall Risks   Follow up Falls prevention discussed;Education provided;Falls evaluation completed Falls prevention discussed  Falls prevention discussed     FALL RISK PREVENTION PERTAINING TO THE HOME:  Any stairs in or around the home? No  If so, are there any without handrails? No  Home free of loose throw rugs in walkways, pet beds, electrical cords, etc? Yes  Adequate lighting in your home  to reduce risk of falls? Yes   ASSISTIVE DEVICES UTILIZED TO PREVENT FALLS:  Life alert? No  Use of a cane, walker or w/c? No  Grab bars in the bathroom? Yes  Shower chair or bench in shower? No  Elevated toilet seat or a handicapped toilet? Yes   TIMED UP AND GO:  Was the test performed? No . Telephonic visit   Cognitive Function:        10/12/2022    4:19 PM 07/23/2021    8:57 AM 07/23/2021    8:39 AM  6CIT Screen  What Year? 0 points 0 points   What month? 0 points 0 points 0 points  What time? 0 points 0 points 0 points  Count back from 20 0 points 0 points 0 points  Months in reverse 0 points 0 points 0 points  Repeat phrase 0 points 0 points 0 points  Total Score 0 points 0 points     Immunizations Immunization History  Administered Date(s) Administered   Fluad Quad(high Dose 65+) 07/03/2021   Influenza Inj Mdck Quad With Preservative 06/25/2017   Influenza,inj,Quad PF,6+ Mos 06/25/2017, 06/14/2018, 04/27/2019   Influenza,inj,quad, With Preservative 06/29/2017   Influenza-Unspecified 07/07/2020, 06/14/2022  Moderna Sars-Covid-2 Vaccination 11/01/2019, 11/30/2019, 08/22/2020   Pneumococcal Conjugate-13 09/06/2014   Pneumococcal Polysaccharide-23 02/14/2018   Tdap 04/27/2018   Zoster Recombinat (Shingrix) 10/19/2017, 03/02/2018    TDAP status: Up to date  Flu Vaccine status: Up to date  Pneumococcal vaccine status: Up to date  Covid-19 vaccine status: Information provided on how to obtain vaccines.   Qualifies for Shingles Vaccine? Yes   Zostavax completed No   Shingrix Completed?: Yes  Screening Tests Health Maintenance  Topic Date Due   FOOT EXAM  Never done   COVID-19 Vaccine (4 - 2023-24 season) 05/07/2022   Diabetic kidney evaluation - Urine ACR  11/04/2022 (Originally 07/23/2022)   OPHTHALMOLOGY EXAM  11/04/2022 (Originally 05/24/2019)   MAMMOGRAM  08/28/2023 (Originally 03/01/2018)   Pneumonia Vaccine 71+ Years old (3 - PPSV23 or PCV20)  02/15/2023   HEMOGLOBIN A1C  02/26/2023   Diabetic kidney evaluation - eGFR measurement  08/28/2023   Medicare Annual Wellness (AWV)  10/13/2023   COLONOSCOPY (Pts 45-49yr Insurance coverage will need to be confirmed)  11/01/2027   DTaP/Tdap/Td (2 - Td or Tdap) 04/27/2028   INFLUENZA VACCINE  Completed   DEXA SCAN  Completed   Hepatitis C Screening  Completed   Zoster Vaccines- Shingrix  Completed   HPV VACCINES  Aged Out    Health Maintenance  Health Maintenance Due  Topic Date Due   FOOT EXAM  Never done   COVID-19 Vaccine (4 - 2023-24 season) 05/07/2022    Colorectal cancer screening: Type of screening: Colonoscopy. Completed 10/31/17. Repeat every 10 years  Mammogram status: No longer required due to bilateral mastectomy .  Bone Density status: Completed 05/31/18. Results reflect: Bone density results: OSTEOPENIA. Repeat every 2 years.  Lung Cancer Screening: (Low Dose CT Chest recommended if Age 68-80years, 30 pack-year currently smoking OR have quit w/in 15years.) does not qualify.   Lung Cancer Screening Referral: n/a   Additional Screening:  Hepatitis C Screening: does qualify; Completed 05/11/16  Vision Screening: Recommended annual ophthalmology exams for early detection of glaucoma and other disorders of the eye. Is the patient up to date with their annual eye exam?  Yes  Who is the provider or what is the name of the office in which the patient attends annual eye exams? Dr. CJorja Loa If pt is not established with a provider, would they like to be referred to a provider to establish care? No .   Dental Screening: Recommended annual dental exams for proper oral hygiene  Community Resource Referral / Chronic Care Management: CRR required this visit?  Yes   CCM required this visit?  No      Plan:     I have personally reviewed and noted the following in the patient's chart:   Medical and social history Use of alcohol, tobacco or illicit drugs  Current  medications and supplements including opioid prescriptions. Patient is not currently taking opioid prescriptions. Functional ability and status Nutritional status Physical activity Advanced directives List of other physicians Hospitalizations, surgeries, and ER visits in previous 12 months Vitals Screenings to include cognitive, depression, and falls Referrals and appointments  In addition, I have reviewed and discussed with patient certain preventive protocols, quality metrics, and best practice recommendations. A written personalized care plan for preventive services as well as general preventive health recommendations were provided to patient.     SVanetta Mulders [place]  27/11/2200  Due to this being a virtual visit, the after visit summary with patients personalized  plan was offered to patient via mail or my-chart.Patient would like to access on my-chart  Nurse Notes: No concerns

## 2022-10-12 NOTE — Patient Instructions (Signed)
[name] , Thank you for taking time to come for your Medicare Wellness Visit. I appreciate your ongoing commitment to your health goals. Please review the following plan we discussed and let me know if I can assist you in the future.   These are the goals we discussed:  Goals      DIET - REDUCE CALORIE INTAKE     Pt states she would like to lose about 10 lbs in the next year.         This is a list of the screening recommended for you and due dates:  Health Maintenance  Topic Date Due   Complete foot exam   Never done   COVID-19 Vaccine (4 - 2023-24 season) 05/07/2022   Medicare Annual Wellness Visit  07/23/2022   Yearly kidney health urinalysis for diabetes  11/04/2022*   Eye exam for diabetics  11/04/2022*   Mammogram  08/28/2023*   Pneumonia Vaccine (3 - PPSV23 or PCV20) 02/15/2023   Hemoglobin A1C  02/26/2023   Yearly kidney function blood test for diabetes  08/28/2023   Colon Cancer Screening  11/01/2027   DTaP/Tdap/Td vaccine (2 - Td or Tdap) 04/27/2028   Flu Shot  Completed   DEXA scan (bone density measurement)  Completed   Hepatitis C Screening: USPSTF Recommendation to screen - Ages 56-79 yo.  Completed   Zoster (Shingles) Vaccine  Completed   HPV Vaccine  Aged Out  *Topic was postponed. The date shown is not the original due date.    Advanced directives: Forms are available if you choose in the future to pursue completion.  This is recommended in order to make sure that your health wishes are honored in the event that you are unable to verbalize them to the provider.    Conditions/risks identified: Aim for 30 minutes of exercise or brisk walking, 6-8 glasses of water, and 5 servings of fruits and vegetables each day.   Next appointment: Follow up in one year for your annual wellness visit    Preventive Care 65 Years and Older, Female Preventive care refers to lifestyle choices and visits with your health care provider that can promote health and wellness. What  does preventive care include? A yearly physical exam. This is also called an annual well check. Dental exams once or twice a year. Routine eye exams. Ask your health care provider how often you should have your eyes checked. Personal lifestyle choices, including: Daily care of your teeth and gums. Regular physical activity. Eating a healthy diet. Avoiding tobacco and drug use. Limiting alcohol use. Practicing safe sex. Taking low-dose aspirin every day. Taking vitamin and mineral supplements as recommended by your health care provider. What happens during an annual well check? The services and screenings done by your health care provider during your annual well check will depend on your age, overall health, lifestyle risk factors, and family history of disease. Counseling  Your health care provider may ask you questions about your: Alcohol use. Tobacco use. Drug use. Emotional well-being. Home and relationship well-being. Sexual activity. Eating habits. History of falls. Memory and ability to understand (cognition). Work and work Statistician. Reproductive health. Screening  You may have the following tests or measurements: Height, weight, and BMI. Blood pressure. Lipid and cholesterol levels. These may be checked every 5 years, or more frequently if you are over 59 years old. Skin check. Lung cancer screening. You may have this screening every year starting at age 37 if you have a 30-pack-year history  of smoking and currently smoke or have quit within the past 15 years. Fecal occult blood test (FOBT) of the stool. You may have this test every year starting at age 56. Flexible sigmoidoscopy or colonoscopy. You may have a sigmoidoscopy every 5 years or a colonoscopy every 10 years starting at age 63. Hepatitis C blood test. Hepatitis B blood test. Sexually transmitted disease (STD) testing. Diabetes screening. This is done by checking your blood sugar (glucose) after you have  not eaten for a while (fasting). You may have this done every 1-3 years. Bone density scan. This is done to screen for osteoporosis. You may have this done starting at age 39. Mammogram. This may be done every 1-2 years. Talk to your health care provider about how often you should have regular mammograms. Talk with your health care provider about your test results, treatment options, and if necessary, the need for more tests. Vaccines  Your health care provider may recommend certain vaccines, such as: Influenza vaccine. This is recommended every year. Tetanus, diphtheria, and acellular pertussis (Tdap, Td) vaccine. You may need a Td booster every 10 years. Zoster vaccine. You may need this after age 47. Pneumococcal 13-valent conjugate (PCV13) vaccine. One dose is recommended after age 33. Pneumococcal polysaccharide (PPSV23) vaccine. One dose is recommended after age 8. Talk to your health care provider about which screenings and vaccines you need and how often you need them. This information is not intended to replace advice given to you by your health care provider. Make sure you discuss any questions you have with your health care provider. Document Released: 09/19/2015 Document Revised: 05/12/2016 Document Reviewed: 06/24/2015 Elsevier Interactive Patient Education  2017 [place] Prevention in the Home Falls can cause injuries. They can happen to people of all ages. There are many things you can do to make your home safe and to help prevent falls. What can I do on the outside of my home? Regularly fix the edges of walkways and driveways and fix any cracks. Remove anything that might make you trip as you walk through a door, such as a raised step or threshold. Trim any bushes or trees on the path to your home. Use bright outdoor lighting. Clear any walking paths of anything that might make someone trip, such as rocks or tools. Regularly check to see if handrails are loose or  broken. Make sure that both sides of any steps have handrails. Any raised decks and porches should have guardrails on the edges. Have any leaves, snow, or ice cleared regularly. Use sand or salt on walking paths during winter. Clean up any spills in your garage right away. This includes oil or grease spills. What can I do in the bathroom? Use night lights. Install grab bars by the toilet and in the tub and shower. Do not use towel bars as grab bars. Use non-skid mats or decals in the tub or shower. If you need to sit down in the shower, use a plastic, non-slip stool. Keep the floor dry. Clean up any water that spills on the floor as soon as it happens. Remove soap buildup in the tub or shower regularly. Attach bath mats securely with double-sided non-slip rug tape. Do not have throw rugs and other things on the floor that can make you trip. What can I do in the bedroom? Use night lights. Make sure that you have a light by your bed that is easy to reach. Do not use any sheets or blankets  that are too big for your bed. They should not hang down onto the floor. Have a firm chair that has side arms. You can use this for support while you get dressed. Do not have throw rugs and other things on the floor that can make you trip. What can I do in the kitchen? Clean up any spills right away. Avoid walking on wet floors. Keep items that you use a lot in easy-to-reach places. If you need to reach something above you, use a strong step stool that has a grab bar. Keep electrical cords out of the way. Do not use floor polish or wax that makes floors slippery. If you must use wax, use non-skid floor wax. Do not have throw rugs and other things on the floor that can make you trip. What can I do with my stairs? Do not leave any items on the stairs. Make sure that there are handrails on both sides of the stairs and use them. Fix handrails that are broken or loose. Make sure that handrails are as long as  the stairways. Check any carpeting to make sure that it is firmly attached to the stairs. Fix any carpet that is loose or worn. Avoid having throw rugs at the top or bottom of the stairs. If you do have throw rugs, attach them to the floor with carpet tape. Make sure that you have a light switch at the top of the stairs and the bottom of the stairs. If you do not have them, ask someone to add them for you. What else can I do to help prevent falls? Wear shoes that: Do not have high heels. Have rubber bottoms. Are comfortable and fit you well. Are closed at the toe. Do not wear sandals. If you use a stepladder: Make sure that it is fully opened. Do not climb a closed stepladder. Make sure that both sides of the stepladder are locked into place. Ask someone to hold it for you, if possible. Clearly mark and make sure that you can see: Any grab bars or handrails. First and last steps. Where the edge of each step is. Use tools that help you move around (mobility aids) if they are needed. These include: Canes. Walkers. Scooters. Crutches. Turn on the lights when you go into a dark area. Replace any light bulbs as soon as they burn out. Set up your furniture so you have a clear path. Avoid moving your furniture around. If any of your floors are uneven, fix them. If there are any pets around you, be aware of where they are. Review your medicines with your doctor. Some medicines can make you feel dizzy. This can increase your chance of falling. Ask your doctor what other things that you can do to help prevent falls. This information is not intended to replace advice given to you by your health care provider. Make sure you discuss any questions you have with your health care provider. Document Released: 06/19/2009 Document Revised: 01/29/2016 Document Reviewed: 09/27/2014 Elsevier Interactive Patient Education  2017 [employer].

## 2022-10-18 ENCOUNTER — Other Ambulatory Visit: Payer: Self-pay | Admitting: Family Medicine

## 2022-10-25 ENCOUNTER — Other Ambulatory Visit: Payer: Self-pay | Admitting: Family Medicine

## 2022-12-07 ENCOUNTER — Ambulatory Visit: Payer: Medicare PPO | Admitting: Family Medicine

## 2022-12-07 VITALS — BP 110/64 | HR 94 | Temp 98.7°F | Ht 63.0 in | Wt 160.2 lb

## 2022-12-07 DIAGNOSIS — J45901 Unspecified asthma with (acute) exacerbation: Secondary | ICD-10-CM | POA: Diagnosis not present

## 2022-12-07 MED ORDER — PREDNISONE 20 MG PO TABS: 40.0000 mg | ORAL_TABLET | Freq: Every day | ORAL | 0 refills | Status: DC

## 2022-12-07 NOTE — Progress Notes (Signed)
Acute Office Visit  Subjective:     Patient ID: Carolyn Glass, female    DOB: 09/18/1955, 67 y.o.   MRN: [id number]  Chief Complaint  Patient presents with   Asthma    SOB, Wheezing x 2 weeks, using nebulizer every other day for relief   Cough    Productive clear mucus x 2 weeks    Asthma She complains of cough. Her past medical history is significant for asthma.  Cough Her past medical history is significant for asthma.   Patient is in today for 2 weeks cough and shortness of breath, chest tightness, wheezing, chest congestion with clear mucus. Denies fever, nasal congestion, sinus pressure, body aches, fatigue, chills. She has been around her grandchildren who have been sick. Has tried Advair 2 puffs BID, Albuterol 2 puffs PRN 1-2 times daily,   Review of Systems  Respiratory:  Positive for cough.   All other systems reviewed and are negative.   Past Medical History:  Diagnosis Date   Anxiety    Arthritis    OA hips   Asthma    allergy and exercise induced asthma    Depression    Diabetes mellitus without complication ([place])    Family history of breast cancer    Family history of colon cancer    Family history of stomach cancer    GERD (gastroesophageal reflux disease)    Hypertension    Malignant neoplasm of lower-outer quadrant of right female breast ([place]) 06/21/2016   Seasonal allergies    Past Surgical History:  Procedure Laterality Date   BREAST LUMPECTOMY     right    BREAST RECONSTRUCTION WITH PLACEMENT OF TISSUE EXPANDER AND FLEX HD (ACELLULAR HYDRATED DERMIS) Bilateral 08/13/2016   Procedure: BREAST RECONSTRUCTION WITH PLACEMENT OF TISSUE EXPANDER AND ALLODERM;  Surgeon: Irene Limbo, MD;  Location: [facility];  Service: Plastics;  Laterality: Bilateral;   COLONOSCOPY N/A 10/31/2017   Procedure: COLONOSCOPY;  Surgeon: Danie Binder, MD;  [facility];  Service: Endoscopy;  Laterality: N/A;  8:30 Am   DILATION AND  CURETTAGE OF UTERUS     JOINT REPLACEMENT     LIPOSUCTION WITH LIPOFILLING Bilateral 11/05/2016   Procedure: LIPOFILLING FROM ABDOMEN TO BILATERAL CHEST;  Surgeon: Irene Limbo, MD;  Location: [facility];  Service: Plastics;  Laterality: Bilateral;   MASTECTOMY W/ SENTINEL NODE BIOPSY Bilateral 08/13/2016   Procedure: BILATERAL TOTAL MASTECTOMY WITH RIGHT AXILLARY SENTINEL LYMPH NODE BIOPSY;  Surgeon: [name], MD;  Location: [facility];  Service: General;  Laterality: Bilateral;   REMOVAL OF BILATERAL TISSUE EXPANDERS WITH PLACEMENT OF BILATERAL BREAST IMPLANTS Bilateral 11/05/2016   Procedure: REMOVAL OF BILATERAL TISSUE EXPANDERS WITH PLACEMENT OF BILATERAL BREAST SALINE IMPLANTS;  Surgeon: Irene Limbo, MD;  Location: [facility];  Service: Plastics;  Laterality: Bilateral;   TOTAL HIP ARTHROPLASTY Right 01/07/2016   Procedure: TOTAL HIP ARTHROPLASTY ANTERIOR APPROACH;  Surgeon: Gaynelle Arabian, MD;  Location: [facility];  Service: Orthopedics;  Laterality: Right;   TOTAL HIP ARTHROPLASTY Left 03/22/2018   Procedure: LEFT TOTAL HIP ARTHROPLASTY ANTERIOR APPROACH;  Surgeon: Gaynelle Arabian, MD;  Location: [facility];  Service: Orthopedics;  Laterality: Left;   Current Outpatient Medications on File Prior to Visit  Medication Sig Dispense Refill   albuterol (VENTOLIN HFA) 108 (90 Base) MCG/ACT inhaler INHALE 2 PUFFS EVERY 6 HOURS AS NEEDED FOR SHORTNESS OF BREATH OR WHEEZING. 18 g 0   benazepril (LOTENSIN) 20 MG  tablet TAKE (1) TABLET BY MOUTH TWICE DAILY. 60 tablet 0   escitalopram (LEXAPRO) 20 MG tablet Take 1 tablet (20 mg total) by mouth daily. 90 tablet 3   fexofenadine (ALLEGRA) 60 MG tablet Take 60 mg by mouth 2 (two) times daily.     fluticasone-salmeterol (ADVAIR HFA) 115-21 MCG/ACT inhaler Inhale 2 puffs into the lungs 2 (two) times daily. 1 each 12   hydrochlorothiazide (HYDRODIURIL) 25 MG tablet Take 1 tablet (25 mg total) by mouth daily. 90  tablet 3   JANUVIA 100 MG tablet TAKE (1) TABLET BY MOUTH ONCE DAILY. 90 tablet 0   metFORMIN (GLUCOPHAGE) 1000 MG tablet TAKE 1 TABLET BY MOUTH TWICE DAILY WITH MEALS. 180 tablet 0   omeprazole (PRILOSEC) 20 MG capsule Take 1 capsule (20 mg total) by mouth 2 (two) times daily before a meal. 60 capsule 3   tamoxifen (NOLVADEX) 20 MG tablet Take 1 tablet (20 mg total) by mouth daily. 90 tablet 1   No current facility-administered medications on file prior to visit.   Allergies  Allergen Reactions   Corn-Containing Products Itching    Running nose   Tomato Itching    Running nose    Azithromycin Itching and Rash   Biaxin [Clarithromycin] Rash   Codeine Rash    Thinks a codeine cough syrup caused rash.        Objective:    BP 110/64   Pulse 94   Temp 98.7 F (37.1 C) (Oral)   Ht 5\' 3"  (1.6 m)   Wt 160 lb 3.2 oz (72.7 kg)   SpO2 100%   BMI 28.38 kg/m    Physical Exam Vitals and nursing note reviewed.  Constitutional:      Appearance: Normal appearance. She is normal weight.  HENT:     Head: Normocephalic and atraumatic.  Cardiovascular:     Rate and Rhythm: Normal rate and regular rhythm.     Pulses: Normal pulses.     Heart sounds: Normal heart sounds.  Pulmonary:     Effort: Pulmonary effort is normal.     Breath sounds: Examination of the right-lower field reveals decreased breath sounds. Examination of the left-lower field reveals decreased breath sounds. Decreased breath sounds present.  Skin:    General: Skin is warm and dry.  Neurological:     General: No focal deficit present.     Mental Status: She is alert and oriented to person, place, and time. Mental status is at baseline.  Psychiatric:        Mood and Affect: Mood normal.        Behavior: Behavior normal.        Thought Content: Thought content normal.        Judgment: Judgment normal.     No results found for any visits on 12/07/22.      Assessment & Plan:   Problem List Items Addressed  This Visit       Respiratory   Asthma exacerbation, mild - Primary    Patient presents today with 2 weeks of worsening cough and shortness of breath requiring Albuterol use 1-2 times daily. No signs of bacterial infection or pneumonia. No wheezing on exam. Will start Prednisone 40mg  daily for 5 days. Instructed to report to office if symptoms persist or if she continues to require daily Albuterol use.      Relevant Medications   predniSONE (DELTASONE) 20 MG tablet    Meds ordered this encounter  Medications   predniSONE (DELTASONE)  20 MG tablet    Sig: Take 2 tablets (40 mg total) by mouth daily with breakfast.    Dispense:  10 tablet    Refill:  0    Order Specific Question:   Supervising Provider    Answer:   Jenna Luo T [[id number]]    Return if symptoms worsen or fail to improve.  Rubie Maid, FNP

## 2022-12-07 NOTE — Assessment & Plan Note (Signed)
Patient presents today with 2 weeks of worsening cough and shortness of breath requiring Albuterol use 1-2 times daily. No signs of bacterial infection or pneumonia. No wheezing on exam. Will start Prednisone 40mg  daily for 5 days. Instructed to report to office if symptoms persist or if she continues to require daily Albuterol use.

## 2022-12-08 ENCOUNTER — Other Ambulatory Visit: Payer: Self-pay | Admitting: Family Medicine

## 2022-12-17 ENCOUNTER — Ambulatory Visit: Payer: Medicare PPO | Admitting: Family Medicine

## 2022-12-17 ENCOUNTER — Encounter: Payer: Self-pay | Admitting: Family Medicine

## 2022-12-17 VITALS — BP 120/76 | HR 100 | Temp 98.8°F | Ht 63.0 in | Wt 157.6 lb

## 2022-12-17 DIAGNOSIS — J45901 Unspecified asthma with (acute) exacerbation: Secondary | ICD-10-CM

## 2022-12-17 DIAGNOSIS — J209 Acute bronchitis, unspecified: Secondary | ICD-10-CM

## 2022-12-17 MED ORDER — ALBUTEROL SULFATE HFA 108 (90 BASE) MCG/ACT IN AERS: 2.0000 | INHALATION_SPRAY | Freq: Four times a day (QID) | RESPIRATORY_TRACT | 0 refills | Status: DC | PRN

## 2022-12-17 MED ORDER — ALBUTEROL SULFATE HFA 108 (90 BASE) MCG/ACT IN AERS: INHALATION_SPRAY | RESPIRATORY_TRACT | 3 refills | Status: DC

## 2022-12-17 MED ORDER — DOXYCYCLINE HYCLATE 100 MG PO TABS: 100.0000 mg | ORAL_TABLET | Freq: Two times a day (BID) | ORAL | 0 refills | Status: DC

## 2022-12-17 MED ORDER — PREDNISONE 20 MG PO TABS: 60.0000 mg | ORAL_TABLET | Freq: Every day | ORAL | 0 refills | Status: DC

## 2022-12-17 MED ORDER — METHYLPREDNISOLONE ACETATE 80 MG/ML IJ SUSP
80.0000 mg | Freq: Once | INTRAMUSCULAR | Status: AC
  Administered 2022-12-17: 60 mg via INTRAMUSCULAR

## 2022-12-17 NOTE — Progress Notes (Signed)
Subjective:    Patient ID: Carolyn Glass, female    DOB: Sep 17, 1955, 67 y.o.   MRN: [id number]  Symptoms started as an upper respiratory infection and then progressed into a full-blown asthma exacerbation.  She was given prednisone 40 mg a day for 5 days.  However it has not improved.  She has not been using albuterol.  She has been depending solely on the prednisone.  She reports worsening shortness of breath, wheezing, purulent sputum.  She states that the cough is productive of brown and green sputum.  She started running fevers in the last 2 days.  Her last fever was greater than 100.  She reports myalgias and feeling sick. Past Medical History:  Diagnosis Date  . Anxiety   . Arthritis    OA hips  . Asthma    allergy and exercise induced asthma   . Depression   . Diabetes mellitus without complication   . Family history of breast cancer   . Family history of colon cancer   . Family history of stomach cancer   . GERD (gastroesophageal reflux disease)   . Hypertension   . Malignant neoplasm of lower-outer quadrant of right female breast 06/21/2016  . Seasonal allergies    Past Surgical History:  Procedure Laterality Date  . BREAST LUMPECTOMY     right   . BREAST RECONSTRUCTION WITH PLACEMENT OF TISSUE EXPANDER AND FLEX HD (ACELLULAR HYDRATED DERMIS) Bilateral 08/13/2016   Procedure: BREAST RECONSTRUCTION WITH PLACEMENT OF TISSUE EXPANDER AND ALLODERM;  Surgeon: Glenna Fellows, MD;  Location: [facility];  Service: Plastics;  Laterality: Bilateral;  . COLONOSCOPY N/A 10/31/2017   Procedure: COLONOSCOPY;  Surgeon: West Bali, MD;  Location: [facility];  Service: Endoscopy;  Laterality: N/A;  8:30 Am  . DILATION AND CURETTAGE OF UTERUS    . JOINT REPLACEMENT    . LIPOSUCTION WITH LIPOFILLING Bilateral 11/05/2016   Procedure: LIPOFILLING FROM ABDOMEN TO BILATERAL CHEST;  Surgeon: Glenna Fellows, MD;  Location: [facility];  Service: Plastics;   Laterality: Bilateral;  . MASTECTOMY W/ SENTINEL NODE BIOPSY Bilateral 08/13/2016   Procedure: BILATERAL TOTAL MASTECTOMY WITH RIGHT AXILLARY SENTINEL LYMPH NODE BIOPSY;  Surgeon: Glenna Fellows, MD;  Location: [facility];  Service: General;  Laterality: Bilateral;  . REMOVAL OF BILATERAL TISSUE EXPANDERS WITH PLACEMENT OF BILATERAL BREAST IMPLANTS Bilateral 11/05/2016   Procedure: REMOVAL OF BILATERAL TISSUE EXPANDERS WITH PLACEMENT OF BILATERAL BREAST SALINE IMPLANTS;  Surgeon: Glenna Fellows, MD;  Location: [facility];  Service: Plastics;  Laterality: Bilateral;  . TOTAL HIP ARTHROPLASTY Right 01/07/2016   Procedure: TOTAL HIP ARTHROPLASTY ANTERIOR APPROACH;  Surgeon: Ollen Gross, MD;  Location: [facility];  Service: Orthopedics;  Laterality: Right;  . TOTAL HIP ARTHROPLASTY Left 03/22/2018   Procedure: LEFT TOTAL HIP ARTHROPLASTY ANTERIOR APPROACH;  Surgeon: Ollen Gross, MD;  Location: [facility];  Service: Orthopedics;  Laterality: Left;   Current Outpatient Medications on File Prior to Visit  Medication Sig Dispense Refill  . albuterol (VENTOLIN HFA) 108 (90 Base) MCG/ACT inhaler INHALE 2 PUFFS EVERY 6 HOURS AS NEEDED FOR SHORTNESS OF BREATH OR WHEEZING. 18 g 0  . benazepril (LOTENSIN) 20 MG tablet TAKE (1) TABLET BY MOUTH TWICE DAILY. 60 tablet 0  . escitalopram (LEXAPRO) 20 MG tablet Take 1 tablet (20 mg total) by mouth daily. 90 tablet 3  . fexofenadine (ALLEGRA) 60 MG tablet Take 60 mg by mouth 2 (two) times daily.    Marland Kitchen  fluticasone-salmeterol (ADVAIR HFA) 115-21 MCG/ACT inhaler Inhale 2 puffs into the lungs 2 (two) times daily. 1 each 12  . hydrochlorothiazide (HYDRODIURIL) 25 MG tablet Take 1 tablet (25 mg total) by mouth daily. 90 tablet 3  . JANUVIA 100 MG tablet TAKE (1) TABLET BY MOUTH ONCE DAILY. 90 tablet 0  . metFORMIN (GLUCOPHAGE) 1000 MG tablet TAKE 1 TABLET BY MOUTH TWICE DAILY WITH MEALS. 180 tablet 1  . omeprazole (PRILOSEC) 20 MG capsule Take 1  capsule (20 mg total) by mouth 2 (two) times daily before a meal. 60 capsule 3  . predniSONE (DELTASONE) 20 MG tablet Take 2 tablets (40 mg total) by mouth daily with breakfast. 10 tablet 0  . tamoxifen (NOLVADEX) 20 MG tablet Take 1 tablet (20 mg total) by mouth daily. 90 tablet 1   No current facility-administered medications on file prior to visit.   Allergies  Allergen Reactions  . Corn-Containing Products Itching    Running nose  . Tomato Itching    Running nose   . Azithromycin Itching and Rash  . Biaxin [Clarithromycin] Rash  . Codeine Rash    Thinks a codeine cough syrup caused rash.   Social History   Socioeconomic History  . Marital status: Married    Spouse name: Elijah Birk  . Number of children: 1  . Years of education: Not on file  . Highest education level: Not on file  Occupational History  . Not on file  Tobacco Use  . Smoking status: Never  . Smokeless tobacco: Never  Vaping Use  . Vaping Use: Never used  Substance and Sexual Activity  . Alcohol use: No  . Drug use: No  . Sexual activity: Yes  Other Topics Concern  . Not on file  Social History Narrative   Married x 37 years in 2022.   1 daughter Carolyn Glass   1 granddaughter, Mardella Layman   Social Determinants of Health   Financial Resource Strain: Low Risk  (10/12/2022)   Overall Financial Resource Strain (CARDIA)   . Difficulty of Paying Living Expenses: Not hard at all  Food Insecurity: No Food Insecurity (10/12/2022)   Hunger Vital Sign   . Worried About Programme researcher, broadcasting/film/video in the Last Year: Never true   . Ran Out of Food in the Last Year: Never true  Transportation Needs: No Transportation Needs (10/12/2022)   PRAPARE - Transportation   . Lack of Transportation (Medical): No   . Lack of Transportation (Non-Medical): No  Physical Activity: Sufficiently Active (10/12/2022)   Exercise Vital Sign   . Days of Exercise per Week: 5 days   . Minutes of Exercise per Session: 30 min  Stress: No Stress Concern Present  (10/12/2022)   [employer] of Occupational Health - Occupational Stress Questionnaire   . Feeling of Stress : Not at all  Social Connections: Socially Integrated (10/12/2022)   Social Connection and Isolation Panel [NHANES]   . Frequency of Communication with Friends and Family: More than three times a week   . Frequency of Social Gatherings with Friends and Family: Three times a week   . Attends Religious Services: More than 4 times per year   . Active Member of Clubs or Organizations: Yes   . Attends Banker Meetings: More than 4 times per year   . Marital Status: Married  Catering manager Violence: Not At Risk (10/12/2022)   Humiliation, Afraid, Rape, and Kick questionnaire   . Fear of Current or Ex-Partner: No   .  Emotionally Abused: No   . Physically Abused: No   . Sexually Abused: No   Family History  Problem Relation Age of Onset  . Arthritis Mother   . Asthma Mother   . Diabetes Mother   . Hyperlipidemia Mother   . Hypertension Mother   . Arthritis Father   . Asthma Father   . Stroke Father   . Hyperlipidemia Father   . Early death Brother 8       Muscular Dystrophy  . Birth defects Brother   . Cancer Maternal Uncle        possibly stomach  . Mental retardation Maternal Uncle   . Cancer Paternal Grandmother 74       breast cancer   . Cancer Cousin 20       paternal cousin's daughter with breast cancer  . Colon cancer Maternal Aunt        dx in her 16s  . Leukemia Paternal Uncle   . Lymphoma Paternal Uncle   . Down syndrome Paternal Uncle       Review of Systems  All other systems reviewed and are negative.      Objective:   Physical Exam Vitals reviewed.  Constitutional:      General: She is not in acute distress.    Appearance: She is well-developed. She is not diaphoretic.  HENT:     Right Ear: Tympanic membrane and ear canal normal.     Left Ear: Tympanic membrane and ear canal normal.  Cardiovascular:     Rate and Rhythm: Normal  rate and regular rhythm.     Heart sounds: Normal heart sounds. No murmur heard.    No friction rub. No gallop.  Pulmonary:     Effort: Pulmonary effort is normal. No respiratory distress.     Breath sounds: Examination of the right-upper field reveals decreased breath sounds and wheezing. Examination of the left-upper field reveals decreased breath sounds and wheezing. Examination of the right-middle field reveals decreased breath sounds and wheezing. Examination of the left-middle field reveals decreased breath sounds and wheezing. Examination of the right-lower field reveals decreased breath sounds and wheezing. Examination of the left-lower field reveals decreased breath sounds and wheezing. Decreased breath sounds and wheezing present. No rales.  Chest:     Chest wall: No tenderness.  Musculoskeletal:        General: No tenderness or deformity. Normal range of motion.     Cervical back: Normal range of motion and neck supple.     Right lower leg: No edema.     Left lower leg: No edema.  Skin:    General: Skin is warm.     Coloration: Skin is not pale.     Findings: No erythema or rash.        Assessment & Plan:  Acute bronchitis with asthma with acute exacerbation Begin prednisone 60 mg a day for 5 days.  She received Depo-Medrol 60 mg IM x 1 now.  Start doxycycline 100 mg twice daily for 7 days given the onset of fever now more than 1 week into the infection and the purulent sputum.  Start albuterol 2 puffs every 6 hours on a scheduled basis until her breathing improves.

## 2022-12-17 NOTE — Addendum Note (Signed)
Addended by: Venia Carbon K on: 12/17/2022 03:57 PM   Modules accepted: Orders

## 2022-12-20 ENCOUNTER — Other Ambulatory Visit: Payer: Self-pay | Admitting: Family Medicine

## 2022-12-20 DIAGNOSIS — K219 Gastro-esophageal reflux disease without esophagitis: Secondary | ICD-10-CM

## 2022-12-27 ENCOUNTER — Other Ambulatory Visit: Payer: Self-pay | Admitting: Hematology

## 2023-01-14 ENCOUNTER — Telehealth: Payer: Self-pay | Admitting: Hematology

## 2023-01-17 ENCOUNTER — Other Ambulatory Visit: Payer: Medicare PPO

## 2023-01-17 ENCOUNTER — Ambulatory Visit: Payer: Medicare PPO | Admitting: Hematology

## 2023-01-18 ENCOUNTER — Other Ambulatory Visit: Payer: Self-pay | Admitting: Family Medicine

## 2023-01-18 NOTE — Telephone Encounter (Signed)
Requested Prescriptions  Pending Prescriptions Disp Refills   JANUVIA 100 MG tablet [Pharmacy Med Name: JANUVIA 100 MG TABLET] 90 tablet 0    Sig: TAKE (1) TABLET BY MOUTH ONCE DAILY.     Endocrinology:  Diabetes - DPP-4 Inhibitors Failed - 01/18/2023  9:23 AM      Failed - Valid encounter within last 6 months    Recent Outpatient Visits           1 year ago Strain of neck muscle, initial encounter   [facility] Valentino Nose, NP   1 year ago Acute left ankle pain   [facility] Tanya Nones, Priscille Heidelberg, MD   1 year ago Sebaceous cyst   [facility] Donita Brooks, MD   2 years ago Upper respiratory tract infection, unspecified type   [facility] Donita Brooks, MD   2 years ago Idiopathic peripheral neuropathy   [facility] Pickard, Priscille Heidelberg, MD              Passed - HBA1C is between 0 and 7.9 and within 180 days    Hgb A1c MFr Bld  Date Value Ref Range Status  08/27/2022 7.1 (H) <5.7 % of total Hgb Final    Comment:    For someone without known diabetes, a hemoglobin A1c value of 6.5% or greater indicates that they may have  diabetes and this should be confirmed with a follow-up  test. . For someone with known diabetes, a value <7% indicates  that their diabetes is well controlled and a value  greater than or equal to 7% indicates suboptimal  control. A1c targets should be individualized based on  duration of diabetes, age, comorbid conditions, and  other considerations. . Currently, no consensus exists regarding use of hemoglobin A1c for diagnosis of diabetes for children. .          Passed - Cr in normal range and within 360 days    Creatinine  Date Value Ref Range Status  06/13/2017 0.8 0.6 - 1.1 mg/dL Final   Creat  Date Value Ref Range Status  08/27/2022 0.51 0.50 - 1.05 mg/dL Final   Creatinine, Urine  Date Value Ref Range Status  07/23/2021 80 20 - 275 mg/dL  Final

## 2023-02-02 ENCOUNTER — Telehealth: Payer: Self-pay

## 2023-02-02 NOTE — Telephone Encounter (Signed)
A representative from [facility] called in to make pcp/nurse aware that pt has diabetes and is recommended to be on a statin per guidelines. Please advise  Contact with any questions: [phone]

## 2023-02-16 DIAGNOSIS — E1165 Type 2 diabetes mellitus with hyperglycemia: Secondary | ICD-10-CM | POA: Insufficient documentation

## 2023-03-14 ENCOUNTER — Telehealth: Payer: Self-pay | Admitting: Hematology

## 2023-03-14 NOTE — Telephone Encounter (Signed)
Patient is aware of upcoming appointment.

## 2023-03-28 ENCOUNTER — Other Ambulatory Visit: Payer: Self-pay | Admitting: Nurse Practitioner

## 2023-04-04 NOTE — Progress Notes (Unsigned)
Patient Care Team: Donita Brooks, MD as PCP - General (Family Medicine) Glenna Fellows, MD (Inactive) as Consulting Physician (General Surgery) Malachy Mood, MD as Consulting Physician (Hematology) Lonie Peak, MD as Attending Physician (Radiation Oncology) Loa Socks, NP as Nurse Practitioner (Hematology and Oncology) Glenna Fellows, MD as Consulting Physician (Plastic Surgery)  Clinic Day:  04/05/2023  Referring physician: Donita Brooks, MD  ASSESSMENT & PLAN:   Assessment & Plan: Malignant neoplasm of lower-outer quadrant of right female breast [facility]) Patient taking Tamoxifen daily.  -episodes of loose or watery stools in the mornings. Becomes normal after that.  -weight gain over past 3 months.  -generally tolerating well.  -continue for total of 7 to 10 years.   Type 2 diabetes mellitus with hyperglycemia (HCC) Blood glucose (non-fasting) is 229 today.  -patient understands to limit intake of excess sugar and carbohydrates.  -will discuss management of DM2 with primary care provider.   Osteopenia due to cancer therapy Bone density test done 2019 indicated osteopenia.  -repeat imaging ordered today.  -maintain adequate intake of calcium and vitamin d  -participate in regular, low-impact exercise daily.   Neoplasm of uncertain behavior of clavicle Present for past few months. Ultrasound ordered for further evaluation.     The patient understands the plans discussed today and is in agreement with them.  She knows to contact our office if she develops concerns prior to her next appointment.  I provided 30 minutes of face-to-face time during this encounter and > 50% was spent counseling as documented under my assessment and plan.    Carlean Jews, NP  [facility] [address] [place] [address] Dept: [phone] Dept Fax: [phone]   Orders Placed This  Encounter  Procedures   DG Bone Density    Standing Status:   Future    Standing Expiration Date:   04/04/2024    Scheduling Instructions:     Patient has had prior bone density tests done at [facility]    Order Specific Question:   Reason for Exam (SYMPTOM  OR DIAGNOSIS REQUIRED)    Answer:   estrogen deficiency. s/p breast cancer    Order Specific Question:   Preferred imaging location?    Answer:   External   US Soft Tissue Head/Neck    Standing Status:   Future    Standing Expiration Date:   04/04/2024    Order Specific Question:   Reason for Exam (SYMPTOM  OR DIAGNOSIS REQUIRED)    Answer:   swelling right clavicle. h/o breast cancer    Order Specific Question:   Preferred imaging location?    Answer:   [facility]      CHIEF COMPLAINT:  CC: right breast cancer of right breast - invasive lobular carcinoma and LCIS. This is ER+/PR+/HER2-  Current Treatment:  tamoxifen daily   INTERVAL HISTORY:  Carolyn Glass is here today for repeat clinical assessment. She denies fevers or chills. She denies pain. Her appetite is good. Her weight has increased 7 pounds over last 4 months .  I have reviewed the past medical history, past surgical history, social history and family history with the patient and they are unchanged from previous note.  ALLERGIES:  is allergic to corn-containing products, tomato, azithromycin, biaxin [clarithromycin], and codeine.  MEDICATIONS:  Current Outpatient Medications  Medication Sig Dispense Refill   albuterol (VENTOLIN HFA) 108 (90 Base) MCG/ACT inhaler Inhale 2 puffs  into the lungs every 6 (six) hours as needed for wheezing or shortness of breath. 8 g 0   benazepril (LOTENSIN) 20 MG tablet TAKE (1) TABLET BY MOUTH TWICE DAILY. 60 tablet 0   escitalopram (LEXAPRO) 20 MG tablet Take 1 tablet (20 mg total) by mouth daily. 90 tablet 3   fexofenadine (ALLEGRA) 60 MG tablet Take 60 mg by mouth 2 (two) times daily.     fluticasone-salmeterol  (ADVAIR HFA) 115-21 MCG/ACT inhaler Inhale 2 puffs into the lungs 2 (two) times daily. 1 each 12   hydrochlorothiazide (HYDRODIURIL) 25 MG tablet Take 1 tablet (25 mg total) by mouth daily. 90 tablet 3   JANUVIA 100 MG tablet TAKE (1) TABLET BY MOUTH ONCE DAILY. 90 tablet 0   metFORMIN (GLUCOPHAGE) 1000 MG tablet TAKE 1 TABLET BY MOUTH TWICE DAILY WITH MEALS. 180 tablet 1   omeprazole (PRILOSEC) 20 MG capsule TAKE ONE CAPSULE BY MOUTH TWICE DAILY BEFORE MEALS 60 capsule 0   predniSONE (DELTASONE) 20 MG tablet Take 3 tablets (60 mg total) by mouth daily with breakfast. 15 tablet 0   tamoxifen (NOLVADEX) 20 MG tablet TAKE (1) TABLET BY MOUTH ONCE DAILY. 90 tablet 0   doxycycline (VIBRA-TABS) 100 MG tablet Take 1 tablet (100 mg total) by mouth 2 (two) times daily. (Patient not taking: Reported on 04/05/2023) 14 tablet 0   predniSONE (DELTASONE) 20 MG tablet Take 2 tablets (40 mg total) by mouth daily with breakfast. (Patient not taking: Reported on 04/05/2023) 10 tablet 0   No current facility-administered medications for this visit.    HISTORY OF PRESENT ILLNESS:   Oncology History Overview Note  Malignant neoplasm of lower-outer quadrant of right female breast [facility])   Staging form: Breast, AJCC 7th Edition   - Clinical stage from 06/23/2016: Stage IA (T1c, N0, M0) - Unsigned  04/05/2023 - currently taking tamoxifen. Goal is to continue through 09/2026. Had to switch from aromisin due to negative side effects  05/31/2018 - bone density test done at [facility] in [place]. She does have osteopenia. Due to have repeat bone density test.     Malignant neoplasm of lower-outer quadrant of right female breast (HCC)  04/28/2015 Imaging   Bone Density Report 04/28/2015 AP Spine (L1-L4): T-Score = -1.7. Osteopenia Femoral Neck (Left) : T-Score = -1.0 Normal Femoral Neck (Right): T-Score = 0.4 Normal   03/01/2016 Mammogram   Bilateral screening mammogram was negative   06/08/2016 Imaging    Bilateral breast MRI with and without contrast showed 4 lesions in the right breast, 2 adjacent 1.4 cm mass in the inferior aspect of the right breast, and in the right lateral aspect of the breast, there are 8 mm and a 7 mm irregular enhancing mass. Multiple small enhancing lesions within the liver, indeterminate. No axillary adenopathy.   06/16/2016 Initial Diagnosis   Malignant neoplasm of lower-outer quadrant of right female breast (HCC)   06/16/2016 Initial Biopsy   Right breast needle core biopsy at 9 clock, 6:30 clock, 6 clock showed invasive lobular carcinoma, and LCIS   06/16/2016 Receptors her2   two right breast biopsy showed ER strongly positive, PR strongly positive, HER-2 negative, Ki-67 5-10%    06/16/2016 Imaging   Right breast and axilla ultrasound showed a 1.4 cm mass at the 6:30 o'clock, 1.0 cm mass at the 6:00, and 8mm mass at 9:00 position. Axilla was negative.   06/28/2016 Imaging   MRI ABDOMEN W W/O CONTRAST 06/28/2016 IMPRESSION: 1. Multiple hepatic hemangiomas.  No suspicious liver lesion. 2. Hepatic steatosis. 3. No evidence of abdominal metastasis.   06/28/2016 Genetic Testing   Patient has genetic testing done for breast cancer. Results revealed patient has the following variant called, MSH2 [id number]>T.   08/13/2016 Surgery   Bilateral simple mastectomy and right SLN biopsy     08/13/2016 Pathology Results   Right mastectomy showed invasive ductal carcinoma, G2, multifocal, 1.5cm, 1.5cm, and 1.0cm. (+) LCIS, margins are negative, 1 sentinel lymph node was negative. Left simple mastectomy showed LCIS, ADH, no malignancy.    08/13/2016 Oncotype testing   Oncotype recurrence score 11, which predicts 10 year distant recurrence risk of 7% with tamoxifen alone, low risk category. No adjuvant chemo recommended based on this.    09/25/2016 -  Anti-estrogen oral therapy   Adjuvant exemestane 25mg  daily    11/05/2016 Surgery   REMOVAL OF BILATERAL TISSUE  EXPANDERS WITH PLACEMENT OF BILATERAL BREAST SALINE IMPLANTS by Dr. Leta Baptist        REVIEW OF SYSTEMS:   Constitutional: Denies fevers, chills or abnormal weight loss Eyes: Denies blurriness of vision Ears, nose, mouth, throat, and face: Denies mucositis or sore throat Respiratory: Denies cough, dyspnea or wheezes Cardiovascular: Denies palpitation, chest discomfort or lower extremity swelling Gastrointestinal:  Denies nausea, heartburn or change in bowel habits. Admits to daily loose/watery bowel movements every morning. Resolve as day moves on.  Skin: Denies abnormal skin rashes Lymphatics: Denies new lymphadenopathy or easy bruising Neurological:Denies numbness, tingling or new weaknesses Behavioral/Psych: Mood is stable, no new changes  *patient has noted swelling and asymmetry of right clavicle. This is slightly tender with palpation.  All other systems were reviewed with the patient and are negative.   VITALS:   Today's Vitals   04/05/23 0850  BP: 114/61  Pulse: 78  Resp: 14  Temp: 98.1 F (36.7 C)  TempSrc: Temporal  SpO2: 97%  Weight: 164 lb 14.4 oz (74.8 kg)   Body mass index is 29.21 kg/m.   Wt Readings from Last 3 Encounters:  04/05/23 164 lb 14.4 oz (74.8 kg)  12/17/22 157 lb 9.6 oz (71.5 kg)  12/07/22 160 lb 3.2 oz (72.7 kg)    Body mass index is 29.21 kg/m.  Performance status (ECOG): 0 - Asymptomatic  PHYSICAL EXAM:   Physical Exam Vitals and nursing note reviewed.  Constitutional:      Appearance: Normal appearance. She is well-developed.  HENT:     Head: Normocephalic and atraumatic.     Nose: Nose normal.     Mouth/Throat:     Mouth: Mucous membranes are moist.     Pharynx: Oropharynx is clear.  Eyes:     Extraocular Movements: Extraocular movements intact.     Conjunctiva/sclera: Conjunctivae normal.     Pupils: Pupils are equal, round, and reactive to light.  Neck:     Vascular: No carotid bruit.   Cardiovascular:     Rate and  Rhythm: Normal rate and regular rhythm.     Pulses: Normal pulses.     Heart sounds: Normal heart sounds.  Pulmonary:     Effort: Pulmonary effort is normal.     Breath sounds: Normal breath sounds.  Chest:     Comments: Bilateral mastectomy with reconstruction Abdominal:     Palpations: Abdomen is soft.  Musculoskeletal:        General: Normal range of motion.     Cervical back: Normal range of motion and neck supple.  Lymphadenopathy:     Cervical: No cervical  adenopathy.  Skin:    General: Skin is warm and dry.     Capillary Refill: Capillary refill takes less than 2 seconds.  Neurological:     General: No focal deficit present.     Mental Status: She is alert and oriented to person, place, and time.  Psychiatric:        Mood and Affect: Mood normal.        Behavior: Behavior normal.        Thought Content: Thought content normal.        Judgment: Judgment normal.      LABORATORY DATA:  I have reviewed the data as listed    Component Value Date/Time   NA 138 04/05/2023 0830   NA 140 06/13/2017 1402   K 3.9 04/05/2023 0830   K 3.9 06/13/2017 1402   CL 102 04/05/2023 0830   CO2 26 04/05/2023 0830   CO2 28 06/13/2017 1402   GLUCOSE 229 (H) 04/05/2023 0830   GLUCOSE 114 06/13/2017 1402   BUN 14 04/05/2023 0830   BUN 11.2 06/13/2017 1402   CREATININE 0.73 04/05/2023 0830   CREATININE 0.51 08/27/2022 1229   CREATININE 0.8 06/13/2017 1402   CALCIUM 9.4 04/05/2023 0830   CALCIUM 9.7 06/13/2017 1402   PROT 6.5 04/05/2023 0830   PROT 7.2 06/13/2017 1402   ALBUMIN 3.9 04/05/2023 0830   ALBUMIN 4.1 06/13/2017 1402   AST 24 04/05/2023 0830   AST 25 06/17/2021 1046   AST 18 06/13/2017 1402   ALT 31 04/05/2023 0830   ALT 25 06/17/2021 1046   ALT 22 06/13/2017 1402   ALKPHOS 65 04/05/2023 0830   ALKPHOS 103 06/13/2017 1402   BILITOT 0.3 04/05/2023 0830   BILITOT 0.4 06/17/2021 1046   BILITOT 0.31 06/13/2017 1402   GFRNONAA >60 04/05/2023 0830   GFRNONAA >60  06/17/2021 1046   GFRNONAA 93 02/19/2020 1633   GFRAA 108 02/19/2020 1633     Lab Results  Component Value Date   WBC 4.7 04/05/2023   NEUTROABS 2.6 04/05/2023   HGB 12.9 04/05/2023   HCT 38.9 04/05/2023   MCV 95.3 04/05/2023   PLT 249 04/05/2023      Chemistry      Component Value Date/Time   NA 138 04/05/2023 0830   NA 140 06/13/2017 1402   K 3.9 04/05/2023 0830   K 3.9 06/13/2017 1402   CL 102 04/05/2023 0830   CO2 26 04/05/2023 0830   CO2 28 06/13/2017 1402   BUN 14 04/05/2023 0830   BUN 11.2 06/13/2017 1402   CREATININE 0.73 04/05/2023 0830   CREATININE 0.51 08/27/2022 1229   CREATININE 0.8 06/13/2017 1402      Component Value Date/Time   CALCIUM 9.4 04/05/2023 0830   CALCIUM 9.7 06/13/2017 1402   ALKPHOS 65 04/05/2023 0830   ALKPHOS 103 06/13/2017 1402   AST 24 04/05/2023 0830   AST 25 06/17/2021 1046   AST 18 06/13/2017 1402   ALT 31 04/05/2023 0830   ALT 25 06/17/2021 1046   ALT 22 06/13/2017 1402   BILITOT 0.3 04/05/2023 0830   BILITOT 0.4 06/17/2021 1046   BILITOT 0.31 06/13/2017 1402        Plan: Continue Tamoxifen daily Bone density test  Ultrasound of neck/clavicular area - will contact patient with results and recommended follow up  Yearly follow up for breast cancer.   Addendum Patient is here for scheduled follow-up.  She is clinically doing well, he is little concerned about the  slight edema in the right supraclavicular area, exam was negative for palpable mass or adenopathy, but we will get an ultrasound of for further evaluation.  She is otherwise tolerating tamoxifen well, no other clinical concern for recurrence.  Will continue tamoxifen until 2028, follow-up in a year.  Malachy Mood MD 04/06/2023

## 2023-04-05 ENCOUNTER — Inpatient Hospital Stay: Payer: Medicare PPO | Admitting: Nurse Practitioner

## 2023-04-05 ENCOUNTER — Other Ambulatory Visit: Payer: Self-pay

## 2023-04-05 ENCOUNTER — Other Ambulatory Visit: Payer: Medicare PPO

## 2023-04-05 VITALS — BP 114/61 | HR 78 | Temp 98.1°F | Resp 14 | Wt 164.9 lb

## 2023-04-05 DIAGNOSIS — D489 Neoplasm of uncertain behavior, unspecified: Secondary | ICD-10-CM | POA: Insufficient documentation

## 2023-04-05 DIAGNOSIS — C50511 Malignant neoplasm of lower-outer quadrant of right female breast: Secondary | ICD-10-CM | POA: Diagnosis not present

## 2023-04-05 DIAGNOSIS — E1165 Type 2 diabetes mellitus with hyperglycemia: Secondary | ICD-10-CM | POA: Insufficient documentation

## 2023-04-05 DIAGNOSIS — M8580 Other specified disorders of bone density and structure, unspecified site: Secondary | ICD-10-CM | POA: Insufficient documentation

## 2023-04-05 DIAGNOSIS — D480 Neoplasm of uncertain behavior of bone and articular cartilage: Secondary | ICD-10-CM | POA: Insufficient documentation

## 2023-04-05 DIAGNOSIS — Z170 Estrogen receptor positive status [ER+]: Secondary | ICD-10-CM | POA: Diagnosis not present

## 2023-04-05 DIAGNOSIS — Z79810 Long term (current) use of selective estrogen receptor modulators (SERMs): Secondary | ICD-10-CM | POA: Insufficient documentation

## 2023-04-05 LAB — CBC WITH DIFFERENTIAL/PLATELET
Abs Immature Granulocytes: 0.02 10*3/uL (ref 0.00–0.07)
Basophils Absolute: 0 10*3/uL (ref 0.0–0.1)
Basophils Relative: 0 %
Eosinophils Absolute: 0.2 10*3/uL (ref 0.0–0.5)
Eosinophils Relative: 5 %
HCT: 38.9 % (ref 36.0–46.0)
Hemoglobin: 12.9 g/dL (ref 12.0–15.0)
Immature Granulocytes: 0 %
Lymphocytes Relative: 32 %
Lymphs Abs: 1.5 10*3/uL (ref 0.7–4.0)
MCH: 31.6 pg (ref 26.0–34.0)
MCHC: 33.2 g/dL (ref 30.0–36.0)
MCV: 95.3 fL (ref 80.0–100.0)
Monocytes Absolute: 0.4 10*3/uL (ref 0.1–1.0)
Monocytes Relative: 9 %
Neutro Abs: 2.6 10*3/uL (ref 1.7–7.7)
Neutrophils Relative %: 54 %
Platelets: 249 10*3/uL (ref 150–400)
RBC: 4.08 MIL/uL (ref 3.87–5.11)
RDW: 12.6 % (ref 11.5–15.5)
WBC: 4.7 10*3/uL (ref 4.0–10.5)
nRBC: 0 % (ref 0.0–0.2)

## 2023-04-05 LAB — COMPREHENSIVE METABOLIC PANEL
ALT: 31 U/L (ref 0–44)
AST: 24 U/L (ref 15–41)
Albumin: 3.9 g/dL (ref 3.5–5.0)
Alkaline Phosphatase: 65 U/L (ref 38–126)
Anion gap: 10 (ref 5–15)
BUN: 14 mg/dL (ref 8–23)
CO2: 26 mmol/L (ref 22–32)
Calcium: 9.4 mg/dL (ref 8.9–10.3)
Chloride: 102 mmol/L (ref 98–111)
Creatinine, Ser: 0.73 mg/dL (ref 0.44–1.00)
GFR, Estimated: >60 mL/min (ref >60)
Glucose, Bld: 229 mg/dL — ABNORMAL HIGH (ref 70–99)
Potassium: 3.9 mmol/L (ref 3.5–5.1)
Sodium: 138 mmol/L (ref 135–145)
Total Bilirubin: 0.3 mg/dL (ref 0.3–1.2)
Total Protein: 6.5 g/dL (ref 6.5–8.1)

## 2023-04-05 NOTE — Assessment & Plan Note (Signed)
Blood glucose (non-fasting) is 229 today.  -patient understands to limit intake of excess sugar and carbohydrates.  -will discuss management of DM2 with primary care provider.

## 2023-04-05 NOTE — Assessment & Plan Note (Signed)
Patient taking Tamoxifen daily.  -episodes of loose or watery stools in the mornings. Becomes normal after that.  -weight gain over past 3 months.  -generally tolerating well.  -continue for total of 7 to 10 years.

## 2023-04-05 NOTE — Assessment & Plan Note (Signed)
Bone density test done 2019 indicated osteopenia.  -repeat imaging ordered today.  -maintain adequate intake of calcium and vitamin d  -participate in regular, low-impact exercise daily.

## 2023-04-05 NOTE — Assessment & Plan Note (Signed)
Present for past few months. Ultrasound ordered for further evaluation.

## 2023-04-11 ENCOUNTER — Ambulatory Visit (HOSPITAL_COMMUNITY)
Admission: RE | Admit: 2023-04-11 | Discharge: 2023-04-11 | Disposition: A | Discharge status: Home / Self Care | Payer: Medicare PPO | Source: Ambulatory Visit | Attending: Nurse Practitioner | Admitting: Nurse Practitioner

## 2023-04-11 DIAGNOSIS — D480 Neoplasm of uncertain behavior of bone and articular cartilage: Secondary | ICD-10-CM | POA: Insufficient documentation

## 2023-04-11 DIAGNOSIS — C413 Malignant neoplasm of ribs, sternum and clavicle: Secondary | ICD-10-CM | POA: Diagnosis not present

## 2023-04-11 NOTE — Progress Notes (Signed)
Hi John, Will you call this patient to let her know that the ultrasound of the clavicular area showed no evidence of abnormality?  Thank you so much. Herbert Seta

## 2023-04-12 ENCOUNTER — Telehealth: Payer: Self-pay

## 2023-04-12 NOTE — Telephone Encounter (Addendum)
Called patient and relayed message below as per Vincent Gros NP. Patient voiced full understanding.    ----- Message from Carlean Jews sent at 04/11/2023  5:06 PM EDT ----- [place], Will you call this patient to let her know that the ultrasound of the clavicular area showed no evidence of abnormality?  Thank you so much. Herbert Seta

## 2023-04-18 ENCOUNTER — Other Ambulatory Visit: Payer: Self-pay | Admitting: Family Medicine

## 2023-04-20 NOTE — Telephone Encounter (Signed)
Requested medication (s) are due for refill today: yes  Requested medication (s) are on the active medication list: yes  Last refill:  01/18/23  Future visit scheduled: no  Notes to clinic:  Unable to refill per protocol, courtesy refill already given, routing for provider approval.      Requested Prescriptions  Pending Prescriptions Disp Refills   JANUVIA 100 MG tablet [Pharmacy Med Name: JANUVIA 100 MG TABLET] 90 tablet 0    Sig: TAKE (1) TABLET BY MOUTH ONCE DAILY.     Endocrinology:  Diabetes - DPP-4 Inhibitors Failed - 04/18/2023 11:41 AM      Failed - HBA1C is between 0 and 7.9 and within 180 days    Hgb A1c MFr Bld  Date Value Ref Range Status  08/27/2022 7.1 (H) <5.7 % of total Hgb Final    Comment:    For someone without known diabetes, a hemoglobin A1c value of 6.5% or greater indicates that they may have  diabetes and this should be confirmed with a follow-up  test. . For someone with known diabetes, a value <7% indicates  that their diabetes is well controlled and a value  greater than or equal to 7% indicates suboptimal  control. A1c targets should be individualized based on  duration of diabetes, age, comorbid conditions, and  other considerations. . Currently, no consensus exists regarding use of hemoglobin A1c for diagnosis of diabetes for children. .          Failed - Valid encounter within last 6 months    Recent Outpatient Visits           1 year ago Strain of neck muscle, initial encounter   [facility] Valentino Nose, NP   1 year ago Acute left ankle pain   [facility] Tanya Nones, Priscille Heidelberg, MD   2 years ago Sebaceous cyst   [facility] Tanya Nones Priscille Heidelberg, MD   2 years ago Upper respiratory tract infection, unspecified type   [facility] Tanya Nones, Priscille Heidelberg, MD   3 years ago Idiopathic peripheral neuropathy   [facility] Pickard, Priscille Heidelberg, MD               Passed - Cr in normal range and within 360 days    Creatinine  Date Value Ref Range Status  06/13/2017 0.8 0.6 - 1.1 mg/dL Final   Creat  Date Value Ref Range Status  08/27/2022 0.51 0.50 - 1.05 mg/dL Final   Creatinine, Ser  Date Value Ref Range Status  04/05/2023 0.73 0.44 - 1.00 mg/dL Final   Creatinine, Urine  Date Value Ref Range Status  07/23/2021 80 20 - 275 mg/dL Final

## 2023-05-16 ENCOUNTER — Other Ambulatory Visit: Payer: Self-pay | Admitting: Family Medicine

## 2023-05-16 DIAGNOSIS — K219 Gastro-esophageal reflux disease without esophagitis: Secondary | ICD-10-CM

## 2023-05-17 NOTE — Telephone Encounter (Signed)
Requested Prescriptions  Pending Prescriptions Disp Refills   omeprazole (PRILOSEC) 20 MG capsule [Pharmacy Med Name: OMEPRAZOLE 20 MG CAPSULE DR] 60 capsule 0    Sig: TAKE ONE CAPSULE BY MOUTH TWICE DAILY BEFORE MEALS     Gastroenterology: Proton Pump Inhibitors Failed - 05/16/2023  2:07 PM      Failed - Valid encounter within last 12 months    Recent Outpatient Visits           1 year ago Strain of neck muscle, initial encounter   [facility] Valentino Nose, NP   1 year ago Acute left ankle pain   [facility] Tanya Nones, Priscille Heidelberg, MD   2 years ago Sebaceous cyst   [facility] Donita Brooks, MD   2 years ago Upper respiratory tract infection, unspecified type   [facility] Donita Brooks, MD   3 years ago Idiopathic peripheral neuropathy   [facility] Pickard, Priscille Heidelberg, MD

## 2023-06-13 ENCOUNTER — Other Ambulatory Visit: Payer: Self-pay | Admitting: Family Medicine

## 2023-06-16 ENCOUNTER — Other Ambulatory Visit: Payer: Self-pay | Admitting: Nurse Practitioner

## 2023-07-19 ENCOUNTER — Other Ambulatory Visit: Payer: Self-pay

## 2023-07-20 ENCOUNTER — Other Ambulatory Visit: Payer: Self-pay

## 2023-08-22 ENCOUNTER — Other Ambulatory Visit: Payer: Self-pay | Admitting: Family Medicine

## 2023-08-22 DIAGNOSIS — K219 Gastro-esophageal reflux disease without esophagitis: Secondary | ICD-10-CM

## 2023-09-18 ENCOUNTER — Other Ambulatory Visit: Payer: Self-pay | Admitting: Family Medicine

## 2023-09-18 DIAGNOSIS — F32A Depression, unspecified: Secondary | ICD-10-CM

## 2023-09-20 NOTE — Telephone Encounter (Signed)
 Requested medications are due for refill today.  yes  Requested medications are on the active medications list.  yes  Last refill. 10/05/2022 #90 3 rf  Future visit scheduled.   no  Notes to clinic.  Pt due for a CPE.    Requested Prescriptions  Pending Prescriptions Disp Refills   escitalopram  (LEXAPRO ) 20 MG tablet [Pharmacy Med Name: escitalopram  20 mg tablet] 90 tablet 3    Sig: Take 1 tablet (20 mg total) by mouth daily.     Psychiatry:  Antidepressants - SSRI Failed - 09/20/2023 10:31 AM      Failed - Valid encounter within last 6 months    Recent Outpatient Visits           2 years ago Strain of neck muscle, initial encounter   [facility] Chandra Harlene LABOR, NP   2 years ago Acute left ankle pain   [facility] Duanne, Butler DASEN, MD   2 years ago Sebaceous cyst   [facility] Duanne Butler DASEN, MD   3 years ago Upper respiratory tract infection, unspecified type   [facility] Duanne Butler DASEN, MD   3 years ago Idiopathic peripheral neuropathy   [facility] Pickard, Butler DASEN, MD

## 2023-09-21 DIAGNOSIS — Z9013 Acquired absence of bilateral breasts and nipples: Secondary | ICD-10-CM | POA: Diagnosis not present

## 2023-09-21 DIAGNOSIS — Z853 Personal history of malignant neoplasm of breast: Secondary | ICD-10-CM | POA: Diagnosis not present

## 2023-09-26 ENCOUNTER — Other Ambulatory Visit: Payer: Self-pay | Admitting: Nurse Practitioner

## 2023-10-05 ENCOUNTER — Other Ambulatory Visit: Payer: Self-pay | Admitting: Family Medicine

## 2023-10-05 DIAGNOSIS — F32A Depression, unspecified: Secondary | ICD-10-CM

## 2023-10-05 MED ORDER — ESCITALOPRAM OXALATE 20 MG PO TABS: 20.0000 mg | ORAL_TABLET | Freq: Every day | ORAL | 0 refills | Status: DC

## 2023-10-05 NOTE — Telephone Encounter (Signed)
Copied from CRM [id number]. Topic: Clinical - Medication Refill >> Oct 05, 2023  9:14 AM Nada Libman H wrote: Most Recent Primary Care Visit:  Provider: Lynnea Ferrier T  Department: [facility]  Visit Type: OFFICE VISIT  Date: 12/17/2022  Medication: escitalopram (LEXAPRO) 20 MG tablet [[id number]]  Has the patient contacted their pharmacy? Yes (Agent: If no, request that the patient contact the pharmacy for the refill. If patient does not wish to contact the pharmacy document the reason why and proceed with request.) (Agent: If yes, when and what did the pharmacy advise?)  Is this the correct pharmacy for this prescription? Yes If no, delete pharmacy and type the correct one.  This is the patient's preferred pharmacy:  [facility] [place], [place] - [id number] [place] [address] Phone: [phone] Fax: [phone]   Has the prescription been filled recently? No  Is the patient out of the medication? Yes  Has the patient been seen for an appointment in the last year OR does the patient have an upcoming appointment? Yes  Can we respond through MyChart? No  Agent: Please be advised that Rx refills may take up to 3 business days. We ask that you follow-up with your pharmacy.

## 2023-10-05 NOTE — Telephone Encounter (Signed)
Requested Prescriptions  Pending Prescriptions Disp Refills   escitalopram (LEXAPRO) 20 MG tablet 30 tablet 0    Sig: Take 1 tablet (20 mg total) by mouth daily.     Psychiatry:  Antidepressants - SSRI Failed - 10/05/2023 11:25 AM      Failed - Valid encounter within last 6 months    Recent Outpatient Visits           2 years ago Strain of neck muscle, initial encounter   [facility] Valentino Nose, NP   2 years ago Acute left ankle pain   [facility] Tanya Nones, Priscille Heidelberg, MD   2 years ago Sebaceous cyst   [facility] Donita Brooks, MD   3 years ago Upper respiratory tract infection, unspecified type   [facility] Donita Brooks, MD   3 years ago Idiopathic peripheral neuropathy   [facility] Pickard, Priscille Heidelberg, MD

## 2023-10-07 ENCOUNTER — Other Ambulatory Visit: Payer: Self-pay | Admitting: Family Medicine

## 2023-11-05 DIAGNOSIS — D259 Leiomyoma of uterus, unspecified: Secondary | ICD-10-CM

## 2023-11-05 HISTORY — DX: Leiomyoma of uterus, unspecified: D25.9

## 2023-11-14 ENCOUNTER — Other Ambulatory Visit: Payer: Self-pay | Admitting: Family Medicine

## 2023-11-14 DIAGNOSIS — Z853 Personal history of malignant neoplasm of breast: Secondary | ICD-10-CM | POA: Diagnosis not present

## 2023-11-14 DIAGNOSIS — N8189 Other female genital prolapse: Secondary | ICD-10-CM | POA: Diagnosis not present

## 2023-11-14 DIAGNOSIS — F32A Depression, unspecified: Secondary | ICD-10-CM

## 2023-11-15 NOTE — Telephone Encounter (Signed)
 Requested medications are due for refill today.  yes  Requested medications are on the active medications list.  yes  Last refill. 10/05/2023 #30 0 rf  Future visit scheduled.   no  Notes to clinic.  Pt is overdue for OV. Courtesy refill already given.    Requested Prescriptions  Pending Prescriptions Disp Refills   escitalopram (LEXAPRO) 20 MG tablet [Pharmacy Med Name: escitalopram 20 mg tablet] 30 tablet 0    Sig: Take 1 tablet (20 mg total) by mouth daily.     Psychiatry:  Antidepressants - SSRI Failed - 11/15/2023 12:43 PM      Failed - Valid encounter within last 6 months    Recent Outpatient Visits           2 years ago Strain of neck muscle, initial encounter   [facility] Valentino Nose, NP   2 years ago Acute left ankle pain   [facility] Tanya Nones, Priscille Heidelberg, MD   2 years ago Sebaceous cyst   [facility] Donita Brooks, MD   3 years ago Upper respiratory tract infection, unspecified type   [facility] Donita Brooks, MD   3 years ago Idiopathic peripheral neuropathy   [facility] Pickard, Priscille Heidelberg, MD

## 2023-11-17 ENCOUNTER — Other Ambulatory Visit: Payer: Self-pay | Admitting: Family Medicine

## 2023-11-17 NOTE — Telephone Encounter (Signed)
 Requested medications are due for refill today.  yes  Requested medications are on the active medications list.  yes  Last refill. 06/13/2023 #180 1 rf  Future visit scheduled.   yes  Notes to clinic.  Pt last seen 12/17/2022 - labs are expired.    Requested Prescriptions  Pending Prescriptions Disp Refills   metFORMIN (GLUCOPHAGE) 1000 MG tablet [Pharmacy Med Name: metformin 1,000 mg tablet] 180 tablet 1    Sig: TAKE 1 TABLET BY MOUTH TWICE DAILY WITH MEALS.     Endocrinology:  Diabetes - Biguanides Failed - 11/17/2023  1:45 PM      Failed - HBA1C is between 0 and 7.9 and within 180 days    Hgb A1c MFr Bld  Date Value Ref Range Status  08/27/2022 7.1 (H) <5.7 % of total Hgb Final    Comment:    For someone without known diabetes, a hemoglobin A1c value of 6.5% or greater indicates that they may have  diabetes and this should be confirmed with a follow-up  test. . For someone with known diabetes, a value <7% indicates  that their diabetes is well controlled and a value  greater than or equal to 7% indicates suboptimal  control. A1c targets should be individualized based on  duration of diabetes, age, comorbid conditions, and  other considerations. . Currently, no consensus exists regarding use of hemoglobin A1c for diagnosis of diabetes for children. .          Failed - B12 Level in normal range and within 720 days    Vitamin B-12  Date Value Ref Range Status  07/23/2021 383 200 - 1,100 pg/mL Final    Comment:    . Please Note: Although the reference range for vitamin B12 is [phone] pg/mL, it has been reported that between 5 and 10% of patients with values between 200 and 400 pg/mL may experience neuropsychiatric and hematologic abnormalities due to occult B12 deficiency; less than 1% of patients with values above 400 pg/mL will have symptoms. .          Failed - Valid encounter within last 6 months    Recent Outpatient Visits           2 years ago Strain  of neck muscle, initial encounter   [facility] Valentino Nose, NP   2 years ago Acute left ankle pain   [facility] Tanya Nones, Priscille Heidelberg, MD   2 years ago Sebaceous cyst   [facility] Tanya Nones, Priscille Heidelberg, MD   3 years ago Upper respiratory tract infection, unspecified type   [facility] Donita Brooks, MD   3 years ago Idiopathic peripheral neuropathy   [facility] Pickard, Priscille Heidelberg, MD              Passed - Cr in normal range and within 360 days    Creatinine  Date Value Ref Range Status  06/13/2017 0.8 0.6 - 1.1 mg/dL Final   Creat  Date Value Ref Range Status  08/27/2022 0.51 0.50 - 1.05 mg/dL Final   Creatinine, Ser  Date Value Ref Range Status  04/05/2023 0.73 0.44 - 1.00 mg/dL Final   Creatinine, Urine  Date Value Ref Range Status  07/23/2021 80 20 - 275 mg/dL Final         Passed - eGFR in normal range and within 360 days    GFR, Est African American  Date Value Ref Range Status  02/19/2020 108 > OR = 60 mL/min/1.57m2 Final   GFR, Est Non African American  Date Value Ref Range Status  02/19/2020 93 > OR = 60 mL/min/1.36m2 Final   GFR, Estimated  Date Value Ref Range Status  04/05/2023 >60 >60 mL/min Final    Comment:    (NOTE) Calculated using the CKD-EPI Creatinine Equation (2021)   06/17/2021 >60 >60 mL/min Final    Comment:    (NOTE) Calculated using the CKD-EPI Creatinine Equation (2021)    eGFR  Date Value Ref Range Status  08/27/2022 102 > OR = 60 mL/min/1.58m2 Final         Passed - CBC within normal limits and completed in the last 12 months    WBC  Date Value Ref Range Status  04/05/2023 4.7 4.0 - 10.5 K/uL Final   RBC  Date Value Ref Range Status  04/05/2023 4.08 3.87 - 5.11 MIL/uL Final   Hemoglobin  Date Value Ref Range Status  04/05/2023 12.9 12.0 - 15.0 g/dL Final   HGB  Date Value Ref Range Status  06/13/2017 12.8 11.6 - 15.9 g/dL  Final   HCT  Date Value Ref Range Status  04/05/2023 38.9 36.0 - 46.0 % Final  06/13/2017 38.5 34.8 - 46.6 % Final   MCHC  Date Value Ref Range Status  04/05/2023 33.2 30.0 - 36.0 g/dL Final   [facility]  Date Value Ref Range Status  04/05/2023 31.6 26.0 - 34.0 pg Final   MCV  Date Value Ref Range Status  04/05/2023 95.3 80.0 - 100.0 fL Final  06/13/2017 95.2 79.5 - 101.0 fL Final   No results found for: "PLTCOUNTKUC", "LABPLAT", "POCPLA" RDW  Date Value Ref Range Status  04/05/2023 12.6 11.5 - 15.5 % Final  06/13/2017 13.8 11.2 - 14.5 % Final

## 2023-11-24 ENCOUNTER — Other Ambulatory Visit: Payer: Self-pay

## 2023-11-24 DIAGNOSIS — D251 Intramural leiomyoma of uterus: Secondary | ICD-10-CM | POA: Diagnosis not present

## 2023-11-24 DIAGNOSIS — Z853 Personal history of malignant neoplasm of breast: Secondary | ICD-10-CM | POA: Diagnosis not present

## 2023-11-24 DIAGNOSIS — D252 Subserosal leiomyoma of uterus: Secondary | ICD-10-CM | POA: Diagnosis not present

## 2023-12-12 ENCOUNTER — Other Ambulatory Visit: Payer: Self-pay | Admitting: Family Medicine

## 2023-12-12 DIAGNOSIS — F32A Depression, unspecified: Secondary | ICD-10-CM

## 2023-12-13 NOTE — Telephone Encounter (Signed)
 Requested medications are due for refill today.  yes  Requested medications are on the active medications list.  yes  Last refill. 11/15/2023 #30 0 rf  Future visit scheduled.   yes  Notes to clinic.  Per protocol - I would only be able to refill this rx for 2 days. Please advise.    Requested Prescriptions  Pending Prescriptions Disp Refills   escitalopram (LEXAPRO) 20 MG tablet [Pharmacy Med Name: escitalopram 20 mg tablet] 30 tablet 0    Sig: TAKE ONE TABLET BY MOUTH EVERY DAY     Psychiatry:  Antidepressants - SSRI Failed - 12/13/2023  1:57 PM      Failed - Valid encounter within last 6 months    Recent Outpatient Visits           12 months ago Acute bronchitis with asthma with acute exacerbation   [facility] Donita Brooks, MD   1 year ago Asthma exacerbation, mild   [facility] [place] Meo, FNP   1 year ago Acute bronchitis with asthma with acute exacerbation   [place] [facility] Park Meo, FNP   1 year ago Shortness of breath   [facility] [facility] Pickard, Priscille Heidelberg, MD   1 year ago Epigastric pain   [place] [facility] [place] Meo, FNP

## 2023-12-15 ENCOUNTER — Ambulatory Visit: Payer: Medicare PPO | Admitting: *Deleted

## 2023-12-15 DIAGNOSIS — Z0000 Encounter for general adult medical examination without abnormal findings: Secondary | ICD-10-CM | POA: Diagnosis not present

## 2023-12-15 NOTE — Patient Instructions (Signed)
 Carolyn Glass , Thank you for taking time to come for your Medicare Wellness Visit. I appreciate your ongoing commitment to your health goals. Please review the following plan we discussed and let me know if I can assist you in the future.   Screening recommendations/referrals: Colonoscopy: up to date Bone Density: Education provided Recommended yearly ophthalmology/optometry visit for glaucoma screening and checkup Recommended yearly dental visit for hygiene and checkup  Vaccinations: Influenza vaccine: up to date Pneumococcal vaccine: up tod ate Tdap vaccine: up to date Shingles vaccine: up to date      Preventive Care 65 Years and Older, Female Preventive care refers to lifestyle choices and visits with your health care provider that can promote health and wellness. What does preventive care include? A yearly physical exam. This is also called an annual well check. Dental exams once or twice a year. Routine eye exams. Ask your health care provider how often you should have your eyes checked. Personal lifestyle choices, including: Daily care of your teeth and gums. Regular physical activity. Eating a healthy diet. Avoiding tobacco and drug use. Limiting alcohol use. Practicing safe sex. Taking low-dose aspirin every day. Taking vitamin and mineral supplements as recommended by your health care provider. What happens during an annual well check? The services and screenings done by your health care provider during your annual well check will depend on your age, overall health, lifestyle risk factors, and family history of disease. Counseling  Your health care provider may ask you questions about your: Alcohol use. Tobacco use. Drug use. Emotional well-being. Home and relationship well-being. Sexual activity. Eating habits. History of falls. Memory and ability to understand (cognition). Work and work Astronomer. Reproductive health. Screening  You may have the following  tests or measurements: Height, weight, and BMI. Blood pressure. Lipid and cholesterol levels. These may be checked every 5 years, or more frequently if you are over 82 years old. Skin check. Lung cancer screening. You may have this screening every year starting at age 41 if you have a 30-pack-year history of smoking and currently smoke or have quit within the past 15 years. Fecal occult blood test (FOBT) of the stool. You may have this test every year starting at age 18. Flexible sigmoidoscopy or colonoscopy. You may have a sigmoidoscopy every 5 years or a colonoscopy every 10 years starting at age 80. Hepatitis C blood test. Hepatitis B blood test. Sexually transmitted disease (STD) testing. Diabetes screening. This is done by checking your blood sugar (glucose) after you have not eaten for a while (fasting). You may have this done every 1-3 years. Bone density scan. This is done to screen for osteoporosis. You may have this done starting at age 34. Mammogram. This may be done every 1-2 years. Talk to your health care provider about how often you should have regular mammograms. Talk with your health care provider about your test results, treatment options, and if necessary, the need for more tests. Vaccines  Your health care provider may recommend certain vaccines, such as: Influenza vaccine. This is recommended every year. Tetanus, diphtheria, and acellular pertussis (Tdap, Td) vaccine. You may need a Td booster every 10 years. Zoster vaccine. You may need this after age 61. Pneumococcal 13-valent conjugate (PCV13) vaccine. One dose is recommended after age 17. Pneumococcal polysaccharide (PPSV23) vaccine. One dose is recommended after age 38. Talk to your health care provider about which screenings and vaccines you need and how often you need them. This information is not intended to  replace advice given to you by your health care provider. Make sure you discuss any questions you have with  your health care provider. Document Released: 09/19/2015 Document Revised: 05/12/2016 Document Reviewed: 06/24/2015 Elsevier Interactive Patient Education  2017 [employer].  Fall Prevention in the Home Falls can cause injuries. They can happen to people of all ages. There are many things you can do to make your home safe and to help prevent falls. What can I do on the outside of my home? Regularly fix the edges of walkways and driveways and fix any cracks. Remove anything that might make you trip as you walk through a door, such as a raised step or threshold. Trim any bushes or trees on the path to your home. Use bright outdoor lighting. Clear any walking paths of anything that might make someone trip, such as rocks or tools. Regularly check to see if handrails are loose or broken. Make sure that both sides of any steps have handrails. Any raised decks and porches should have guardrails on the edges. Have any leaves, snow, or ice cleared regularly. Use sand or salt on walking paths during winter. Clean up any spills in your garage right away. This includes oil or grease spills. What can I do in the bathroom? Use night lights. Install grab bars by the toilet and in the tub and shower. Do not use towel bars as grab bars. Use non-skid mats or decals in the tub or shower. If you need to sit down in the shower, use a plastic, non-slip stool. Keep the floor dry. Clean up any water that spills on the floor as soon as it happens. Remove soap buildup in the tub or shower regularly. Attach bath mats securely with double-sided non-slip rug tape. Do not have throw rugs and other things on the floor that can make you trip. What can I do in the bedroom? Use night lights. Make sure that you have a light by your bed that is easy to reach. Do not use any sheets or blankets that are too big for your bed. They should not hang down onto the floor. Have a firm chair that has side arms. You can use this  for support while you get dressed. Do not have throw rugs and other things on the floor that can make you trip. What can I do in the kitchen? Clean up any spills right away. Avoid walking on wet floors. Keep items that you use a lot in easy-to-reach places. If you need to reach something above you, use a strong step stool that has a grab bar. Keep electrical cords out of the way. Do not use floor polish or wax that makes floors slippery. If you must use wax, use non-skid floor wax. Do not have throw rugs and other things on the floor that can make you trip. What can I do with my stairs? Do not leave any items on the stairs. Make sure that there are handrails on both sides of the stairs and use them. Fix handrails that are broken or loose. Make sure that handrails are as long as the stairways. Check any carpeting to make sure that it is firmly attached to the stairs. Fix any carpet that is loose or worn. Avoid having throw rugs at the top or bottom of the stairs. If you do have throw rugs, attach them to the floor with carpet tape. Make sure that you have a light switch at the top of the stairs and the  bottom of the stairs. If you do not have them, ask someone to add them for you. What else can I do to help prevent falls? Wear shoes that: Do not have high heels. Have rubber bottoms. Are comfortable and fit you well. Are closed at the toe. Do not wear sandals. If you use a stepladder: Make sure that it is fully opened. Do not climb a closed stepladder. Make sure that both sides of the stepladder are locked into place. Ask someone to hold it for you, if possible. Clearly mark and make sure that you can see: Any grab bars or handrails. First and last steps. Where the edge of each step is. Use tools that help you move around (mobility aids) if they are needed. These include: Canes. Walkers. Scooters. Crutches. Turn on the lights when you go into a dark area. Replace any light bulbs as  soon as they burn out. Set up your furniture so you have a clear path. Avoid moving your furniture around. If any of your floors are uneven, fix them. If there are any pets around you, be aware of where they are. Review your medicines with your doctor. Some medicines can make you feel dizzy. This can increase your chance of falling. Ask your doctor what other things that you can do to help prevent falls. This information is not intended to replace advice given to you by your health care provider. Make sure you discuss any questions you have with your health care provider. Document Released: 06/19/2009 Document Revised: 01/29/2016 Document Reviewed: 09/27/2014 Elsevier Interactive Patient Education  2017 [employer].

## 2023-12-15 NOTE — Progress Notes (Signed)
 Subjective:   Carolyn Glass is a 68 y.o. female who presents for Medicare Annual (Subsequent) preventive examination.  Visit Complete: Virtual I connected with  Carolyn Glass on 12/15/23 by a audio enabled telemedicine application and verified that I am speaking with the correct person using two identifiers.  Patient Location: Home  Provider Location: Home Office  I discussed the limitations of evaluation and management by telemedicine. The patient expressed understanding and agreed to proceed.  Vital Signs: Because this visit was a virtual/telehealth visit, some criteria may be missing or patient reported. Any vitals not documented were not able to be obtained and vitals that have been documented are patient reported.  Cardiac Risk Factors include: advanced age (>64men, >55 women);diabetes mellitus     Objective:    There were no vitals filed for this visit. There is no height or weight on file to calculate BMI.     12/15/2023   10:24 AM 10/12/2022    4:18 PM 07/23/2021    8:33 AM 10/17/2019    2:33 PM 03/22/2018    3:42 PM 03/13/2018    1:37 PM 12/12/2017    2:28 PM  Advanced Directives  Does Patient Have a Medical Advance Directive? Yes No Yes No No No No  Type of Estate agent of [employer] Power of [place];Living will      Copy of Healthcare Power of Attorney in Chart? No - copy requested  No - copy requested      Would patient like information on creating a medical advance directive?  No - Patient declined  No - Patient declined No - Patient declined No - Patient declined No - Patient declined    Current Medications (verified) Outpatient Encounter Medications as of 12/15/2023  Medication Sig   albuterol (VENTOLIN HFA) 108 (90 Base) MCG/ACT inhaler Inhale 2 puffs into the lungs every 6 (six) hours as needed for wheezing or shortness of breath.   benazepril (LOTENSIN) 20 MG tablet TAKE (1) TABLET BY MOUTH TWICE DAILY.   escitalopram (LEXAPRO)  20 MG tablet Take 1 tablet (20 mg total) by mouth daily.   fexofenadine (ALLEGRA) 60 MG tablet Take 60 mg by mouth 2 (two) times daily.   fluticasone-salmeterol (ADVAIR HFA) 115-21 MCG/ACT inhaler Inhale 2 puffs into the lungs 2 (two) times daily.   hydrochlorothiazide (HYDRODIURIL) 25 MG tablet Take 1 tablet (25 mg total) by mouth daily.   metFORMIN (GLUCOPHAGE) 1000 MG tablet TAKE 1 TABLET BY MOUTH TWICE DAILY WITH MEALS.   omeprazole (PRILOSEC) 20 MG capsule TAKE ONE CAPSULE BY MOUTH TWICE DAILY   sitaGLIPtin (JANUVIA) 100 MG tablet TAKE (1) TABLET BY MOUTH ONCE DAILY.   tamoxifen (NOLVADEX) 20 MG tablet TAKE (1) TABLET BY MOUTH ONCE DAILY.   doxycycline (VIBRA-TABS) 100 MG tablet Take 1 tablet (100 mg total) by mouth 2 (two) times daily. (Patient not taking: Reported on 04/05/2023)   No facility-administered encounter medications on file as of 12/15/2023.    Allergies (verified) Corn-containing products, Tomato, Azithromycin, Biaxin [clarithromycin], and Codeine   History: Past Medical History:  Diagnosis Date   Anxiety    Arthritis    OA hips   Asthma    allergy and exercise induced asthma    Depression    Diabetes mellitus without complication (HCC)    Family history of breast cancer    Family history of colon cancer    Family history of stomach cancer    GERD (gastroesophageal reflux disease)  Hypertension    Malignant neoplasm of lower-outer quadrant of right female breast (HCC) 06/21/2016   Seasonal allergies    Past Surgical History:  Procedure Laterality Date   BREAST LUMPECTOMY     right    BREAST RECONSTRUCTION WITH PLACEMENT OF TISSUE EXPANDER AND FLEX HD (ACELLULAR HYDRATED DERMIS) Bilateral 08/13/2016   Procedure: BREAST RECONSTRUCTION WITH PLACEMENT OF TISSUE EXPANDER AND ALLODERM;  Surgeon: Glenna Fellows, MD;  Location: [facility];  Service: Plastics;  Laterality: Bilateral;   COLONOSCOPY N/A 10/31/2017   Procedure: COLONOSCOPY;  Surgeon:  West Bali, MD;  Location: [facility];  Service: Endoscopy;  Laterality: N/A;  8:30 Am   DILATION AND CURETTAGE OF UTERUS     JOINT REPLACEMENT     LIPOSUCTION WITH LIPOFILLING Bilateral 11/05/2016   Procedure: LIPOFILLING FROM ABDOMEN TO BILATERAL CHEST;  Surgeon: Glenna Fellows, MD;  Location: [facility];  Service: Plastics;  Laterality: Bilateral;   MASTECTOMY W/ SENTINEL NODE BIOPSY Bilateral 08/13/2016   Procedure: BILATERAL TOTAL MASTECTOMY WITH RIGHT AXILLARY SENTINEL LYMPH NODE BIOPSY;  Surgeon: Glenna Fellows, MD;  Location: [facility];  Service: General;  Laterality: Bilateral;   REMOVAL OF BILATERAL TISSUE EXPANDERS WITH PLACEMENT OF BILATERAL BREAST IMPLANTS Bilateral 11/05/2016   Procedure: REMOVAL OF BILATERAL TISSUE EXPANDERS WITH PLACEMENT OF BILATERAL BREAST SALINE IMPLANTS;  Surgeon: Glenna Fellows, MD;  Location: [facility];  Service: Plastics;  Laterality: Bilateral;   TOTAL HIP ARTHROPLASTY Right 01/07/2016   Procedure: TOTAL HIP ARTHROPLASTY ANTERIOR APPROACH;  Surgeon: Ollen Gross, MD;  Location: [facility];  Service: Orthopedics;  Laterality: Right;   TOTAL HIP ARTHROPLASTY Left 03/22/2018   Procedure: LEFT TOTAL HIP ARTHROPLASTY ANTERIOR APPROACH;  Surgeon: Ollen Gross, MD;  Location: [facility];  Service: Orthopedics;  Laterality: Left;   Family History  Problem Relation Age of Onset   Arthritis Mother    Asthma Mother    Diabetes Mother    Hyperlipidemia Mother    Hypertension Mother    Arthritis Father    Asthma Father    Stroke Father    Hyperlipidemia Father    Early death Brother 8       Muscular Dystrophy   Birth defects Brother    Cancer Maternal Uncle        possibly stomach   Mental retardation Maternal Uncle    Cancer Paternal Grandmother 42       breast cancer    Cancer Cousin 4       paternal cousin's daughter with breast cancer   Colon cancer Maternal Aunt        dx in her 71s   Leukemia  Paternal Uncle    Lymphoma Paternal Uncle    Down syndrome Paternal Uncle    Social History   Socioeconomic History   Marital status: Married    Spouse name: [name]   Number of children: 1   Years of education: Not on file   Highest education level: Not on file  Occupational History   Not on file  Tobacco Use   Smoking status: Never   Smokeless tobacco: Never  Vaping Use   Vaping status: Never Used  Substance and Sexual Activity   Alcohol use: No   Drug use: No   Sexual activity: Yes  Other Topics Concern   Not on file  Social History Narrative   Married x 37 years in 2022.   1 daughter Nehemiah Settle   1 granddaughter, Mardella Layman   Social  Drivers of Corporate investment banker Strain: Low Risk  (12/15/2023)   Overall Financial Resource Strain (CARDIA)    Difficulty of Paying Living Expenses: Not hard at all  Food Insecurity: No Food Insecurity (12/15/2023)   Hunger Vital Sign    Worried About Running Out of Food in the Last Year: Never true    Ran Out of Food in the Last Year: Never true  Transportation Needs: No Transportation Needs (12/15/2023)   PRAPARE - Administrator, Civil Service (Medical): No    Lack of Transportation (Non-Medical): No  Physical Activity: Sufficiently Active (12/15/2023)   Exercise Vital Sign    Days of Exercise per Week: 4 days    Minutes of Exercise per Session: 40 min  Stress: No Stress Concern Present (12/15/2023)   [employer] of Occupational Health - Occupational Stress Questionnaire    Feeling of Stress : Not at all  Social Connections: Socially Integrated (12/15/2023)   Social Connection and Isolation Panel [NHANES]    Frequency of Communication with Friends and Family: More than three times a week    Frequency of Social Gatherings with Friends and Family: More than three times a week    Attends Religious Services: More than 4 times per year    Active Member of [employer] or Organizations: Yes    Attends Engineer, structural:  More than 4 times per year    Marital Status: Married    Tobacco Counseling Counseling given: Not Answered   Clinical Intake:  Pre-visit preparation completed: Yes  Pain : No/denies pain     Diabetes: Yes CBG done?: No Did pt. bring in CBG monitor from home?: No  How often do you need to have someone help you when you read instructions, pamphlets, or other written materials from your doctor or pharmacy?: 1 - Never  Interpreter Needed?: No  Information entered by :: Remi Haggard LPN   Activities of Daily Living    12/15/2023   10:28 AM  In your present state of health, do you have any difficulty performing the following activities:  Hearing? 0  Vision? 0  Difficulty concentrating or making decisions? 0  Walking or climbing stairs? 0  Dressing or bathing? 0  Doing errands, shopping? 0  Preparing Food and eating ? N  Using the Toilet? N  In the past six months, have you accidently leaked urine? Y  Do you have problems with loss of bowel control? N  Managing your Medications? N  Managing your Finances? N  Housekeeping or managing your Housekeeping? N    Patient Care Team: Donita Brooks, MD as PCP - General (Family Medicine) Glenna Fellows, MD (Inactive) as Consulting Physician (General Surgery) Malachy Mood, MD as Consulting Physician (Hematology) Lonie Peak, MD as Attending Physician (Radiation Oncology) Axel Filler Larna Daughters, NP as Nurse Practitioner (Hematology and Oncology) Glenna Fellows, MD as Consulting Physician (Plastic Surgery) [facility]  Indicate any recent Medical Services you may have received from other than [facility] providers in the past year (date may be approximate).     Assessment:   This is a routine wellness examination for [place].  Hearing/Vision screen Hearing Screening - Comments:: No trouble hearing Vision Screening - Comments:: Up to date cotter   Goals Addressed             This  Visit's Progress    DIET - REDUCE CALORIE INTAKE   Not on track    Pt states she would  like to lose about 10 lbs in the next year.      Weight (lb) < 200 lb (90.7 kg)       Increase stretching exercise        Depression Screen    12/15/2023   10:27 AM 12/17/2022    3:31 PM 12/07/2022    3:03 PM 10/12/2022    4:17 PM 08/27/2022   12:19 PM 08/04/2022    4:17 PM 07/23/2021    8:30 AM  PHQ 2/9 Scores  PHQ - 2 Score 0 0 1 0 0 0 1  PHQ- 9 Score 0 0 5  0 0     Fall Risk    12/15/2023   10:22 AM 12/17/2022    3:31 PM 12/07/2022    3:03 PM 10/12/2022    4:17 PM 08/27/2022   12:19 PM  Fall Risk   Falls in the past year? 0 0 0 0 0  Number falls in past yr: 0 0 0 0 0  Injury with Fall? 0 0 0 0 0  Risk for fall due to :  No Fall Risks   No Fall Risks  Follow up Falls evaluation completed;Education provided;Falls prevention discussed Falls prevention discussed  Falls prevention discussed;Education provided;Falls evaluation completed Falls prevention discussed    MEDICARE RISK AT HOME: Medicare Risk at Home Any stairs in or around the home?: No If so, are there any without handrails?: No Home free of loose throw rugs in walkways, pet beds, electrical cords, etc?: Yes Adequate lighting in your home to reduce risk of falls?: Yes Life alert?: No Use of a cane, walker or w/c?: No Grab bars in the bathroom?: No Shower chair or bench in shower?: No Elevated toilet seat or a handicapped toilet?: Yes  TIMED UP AND GO:  Was the test performed?  No    Cognitive Function:        12/15/2023   10:24 AM 10/12/2022    4:19 PM 07/23/2021    8:57 AM 07/23/2021    8:39 AM  6CIT Screen  What Year? 0 points 0 points 0 points   What month? 0 points 0 points 0 points 0 points  What time? 0 points 0 points 0 points 0 points  Count back from 20 0 points 0 points 0 points 0 points  Months in reverse 0 points 0 points 0 points 0 points  Repeat phrase 0 points 0 points 0 points 0 points  Total Score 0  points 0 points 0 points     Immunizations Immunization History  Administered Date(s) Administered   Fluad Quad(high Dose 65+) 07/03/2021   Influenza Inj Mdck Quad With Preservative 06/25/2017   Influenza,inj,Quad PF,6+ Mos 06/25/2017, 06/14/2018, 04/27/2019   Influenza,inj,quad, With Preservative 06/29/2017   Influenza-Unspecified 07/07/2020, 06/14/2022, 06/01/2023   Moderna Sars-Covid-2 Vaccination 11/01/2019, 11/30/2019, 08/22/2020   Pneumococcal Conjugate-13 09/06/2014   Pneumococcal Polysaccharide-23 02/14/2018   Tdap 04/27/2018   Zoster Recombinant(Shingrix) 10/19/2017, 03/02/2018    TDAP status: Up to date  Flu Vaccine status: Up to date  Pneumococcal vaccine status: Up to date  Covid-19 vaccine status: Information provided on how to obtain vaccines.   Qualifies for Shingles Vaccine? No   Zostavax completed Yes   Shingrix Completed?: Yes  Screening Tests Health Maintenance  Topic Date Due   FOOT EXAM  Never done   MAMMOGRAM  06/08/2018   OPHTHALMOLOGY EXAM  05/24/2019   Diabetic kidney evaluation - Urine ACR  07/23/2022   Pneumonia Vaccine 57+ Years old (  3 of 3 - PCV20 or PCV21) 02/15/2023   HEMOGLOBIN A1C  02/26/2023   COVID-19 Vaccine (4 - 2024-25 season) 05/08/2023   Diabetic kidney evaluation - eGFR measurement  04/04/2024   INFLUENZA VACCINE  04/06/2024   Medicare Annual Wellness (AWV)  12/14/2024   Colonoscopy  11/01/2027   DTaP/Tdap/Td (2 - Td or Tdap) 04/27/2028   DEXA SCAN  Completed   Hepatitis C Screening  Completed   Zoster Vaccines- Shingrix  Completed   HPV VACCINES  Aged Out   Meningococcal B Vaccine  Aged Out    Health Maintenance  Health Maintenance Due  Topic Date Due   FOOT EXAM  Never done   MAMMOGRAM  06/08/2018   OPHTHALMOLOGY EXAM  05/24/2019   Diabetic kidney evaluation - Urine ACR  07/23/2022   Pneumonia Vaccine 82+ Years old (3 of 3 - PCV20 or PCV21) 02/15/2023   HEMOGLOBIN A1C  02/26/2023   COVID-19 Vaccine (4 - 2024-25  season) 05/08/2023    Colorectal cancer screening: Type of screening: Colonoscopy. Completed 2019. Repeat every 10 years  Mammogram     Bone Density status: Ordered patient will call to schedule. Pt provided with contact info and advised to call to schedule appt.  Lung Cancer Screening: (Low Dose CT Chest recommended if Age 80-80 years, 20 pack-year currently smoking OR have quit w/in 15years.) does not qualify.   Lung Cancer Screening Referral:   Additional Screening:  Hepatitis C Screening: does not qualify; Completed 2017  Vision Screening: Recommended annual ophthalmology exams for early detection of glaucoma and other disorders of the eye. Is the patient up to date with their annual eye exam?  Yes  Who is the provider or what is the name of the office in which the patient attends annual eye exams? [facility] If pt is not established with a provider, would they like to be referred to a provider to establish care? No .   Dental Screening: Recommended annual dental exams for proper oral hygiene  Nutrition Risk Assessment:  Has the patient had any N/V/D within the last 2 months?  No  Does the patient have any non-healing wounds?  No  Has the patient had any unintentional weight loss or weight gain?  No   Diabetes:  Is the patient diabetic?  Yes  If diabetic, was a CBG obtained today?  No  Did the patient bring in their glucometer from home?  No  How often do you monitor your CBG's? Does not check.   Financial Strains and Diabetes Management:  Are you having any financial strains with the device, your supplies or your medication? No .  Does the patient want to be seen by Chronic Care Management for management of their diabetes?  No  Would the patient like to be referred to a Nutritionist or for Diabetic Management?  No   Diabetic Exams:  Diabetic Eye Exam: Completed . Pt has been advised about the importance in completing this exam.   Diabetic Foot Exam: . Pt has been  advised about the importance in completing this exam. .    Community Resource Referral / Chronic Care Management: CRR required this visit?  No   CCM required this visit?  No     Plan:     I have personally reviewed and noted the following in the patient's chart:   Medical and social history Use of alcohol, tobacco or illicit drugs  Current medications and supplements including opioid prescriptions. Patient is not currently taking opioid prescriptions.  Functional ability and status Nutritional status Physical activity Advanced directives List of other physicians Hospitalizations, surgeries, and ER visits in previous 12 months Vitals Screenings to include cognitive, depression, and falls Referrals and appointments  In addition, I have reviewed and discussed with patient certain preventive protocols, quality metrics, and best practice recommendations. A written personalized care plan for preventive services as well as general preventive health recommendations were provided to patient.     Remi Haggard, LPN   0/27/2536   After Visit Summary: (MyChart) Due to this being a telephonic visit, the after visit summary with patients personalized plan was offered to patient via MyChart   Nurse Notes:

## 2023-12-16 ENCOUNTER — Other Ambulatory Visit: Payer: Self-pay

## 2023-12-16 ENCOUNTER — Telehealth: Payer: Self-pay

## 2023-12-16 DIAGNOSIS — F32A Depression, unspecified: Secondary | ICD-10-CM

## 2023-12-16 MED ORDER — ESCITALOPRAM OXALATE 20 MG PO TABS: 20.0000 mg | ORAL_TABLET | Freq: Every day | ORAL | 1 refills | Status: DC

## 2023-12-16 NOTE — Telephone Encounter (Signed)
 Refill for Escitalopram sent to [facility]. Pt has an appointment for 12/23/2023. Mjp,lpn  Copied from CRM [id number]. Topic: Clinical - Prescription Issue >> Dec 16, 2023  4:21 PM Carolyn Glass wrote: Reason for CRM: Patient calling about prescription denied when patient tried to pick it up from pharmacy, and Patient was told she has to have a visit with the Dr office and the patient states she previously had a visit and the office cancelled and rescheduled for 4/18 and patient would like to ask if the office could call the pharmacy so she can get her prescription picked up tomorrow  Patient number [phone] (M)

## 2023-12-23 ENCOUNTER — Ambulatory Visit: Admitting: Family Medicine

## 2023-12-23 ENCOUNTER — Encounter: Payer: Self-pay | Admitting: Family Medicine

## 2023-12-23 VITALS — BP 120/70 | HR 90 | Temp 97.8°F | Ht 63.0 in | Wt 165.4 lb

## 2023-12-23 DIAGNOSIS — Z7984 Long term (current) use of oral hypoglycemic drugs: Secondary | ICD-10-CM

## 2023-12-23 DIAGNOSIS — E119 Type 2 diabetes mellitus without complications: Secondary | ICD-10-CM | POA: Diagnosis not present

## 2023-12-23 NOTE — Progress Notes (Signed)
 Subjective:    Patient ID: Carolyn Glass, female    DOB: [birth year], 68 y.o.   MRN: [id number]  Patient is here today for a follow-up of her diabetes.  Her blood pressure today is excellent.  She denies any chest pain shortness of breath or dyspnea on exertion.  She is not checking her blood sugar.  She is currently on metformin  and Januvia .  She denies any hypoglycemia.  She denies any neuropathy in her feet. Past Medical History:  Diagnosis Date   Anxiety    Arthritis    OA hips   Asthma    allergy and exercise induced asthma    Depression    Diabetes mellitus without complication (HCC)    Family history of breast cancer    Family history of colon cancer    Family history of stomach cancer    GERD (gastroesophageal reflux disease)    Hypertension    Malignant neoplasm of lower-outer quadrant of right female breast (HCC) 06/21/2016   Seasonal allergies    Past Surgical History:  Procedure Laterality Date   BREAST LUMPECTOMY     right    BREAST RECONSTRUCTION WITH PLACEMENT OF TISSUE EXPANDER AND FLEX HD (ACELLULAR HYDRATED DERMIS) Bilateral 08/13/2016   Procedure: BREAST RECONSTRUCTION WITH PLACEMENT OF TISSUE EXPANDER AND ALLODERM;  Surgeon: Alger Infield, MD;  Location: [facility];  Service: Plastics;  Laterality: Bilateral;   COLONOSCOPY N/A 10/31/2017   Procedure: COLONOSCOPY;  Surgeon: Alyce Jubilee, MD;  [facility];  Service: Endoscopy;  Laterality: N/A;  8:30 Am   DILATION AND CURETTAGE OF UTERUS     JOINT REPLACEMENT     LIPOSUCTION WITH LIPOFILLING Bilateral 11/05/2016   Procedure: LIPOFILLING FROM ABDOMEN TO BILATERAL CHEST;  Surgeon: Alger Infield, MD;  Location: [facility];  Service: Plastics;  Laterality: Bilateral;   MASTECTOMY W/ SENTINEL NODE BIOPSY Bilateral 08/13/2016   Procedure: BILATERAL TOTAL MASTECTOMY WITH RIGHT AXILLARY SENTINEL LYMPH NODE BIOPSY;  Surgeon: Ayesha Lente, MD;  Location: [facility];  Service: General;  Laterality: Bilateral;   REMOVAL OF BILATERAL TISSUE EXPANDERS WITH PLACEMENT OF BILATERAL BREAST IMPLANTS Bilateral 11/05/2016   Procedure: REMOVAL OF BILATERAL TISSUE EXPANDERS WITH PLACEMENT OF BILATERAL BREAST SALINE IMPLANTS;  Surgeon: Alger Infield, MD;  Location: [facility];  Service: Plastics;  Laterality: Bilateral;   TOTAL HIP ARTHROPLASTY Right 01/07/2016   Procedure: TOTAL HIP ARTHROPLASTY ANTERIOR APPROACH;  Surgeon: Liliane Rei, MD;  [facility];  Service: Orthopedics;  Laterality: Right;   TOTAL HIP ARTHROPLASTY Left 03/22/2018   Procedure: LEFT TOTAL HIP ARTHROPLASTY ANTERIOR APPROACH;  Surgeon: Liliane Rei, MD;  [facility];  Service: Orthopedics;  Laterality: Left;   Current Outpatient Medications on File Prior to Visit  Medication Sig Dispense Refill   albuterol  (VENTOLIN  HFA) 108 (90 Base) MCG/ACT inhaler Inhale 2 puffs into the lungs every 6 (six) hours as needed for wheezing or shortness of breath. 8 g 0   benazepril  (LOTENSIN ) 20 MG tablet TAKE (1) TABLET BY MOUTH TWICE DAILY. 180 tablet 3   doxycycline  (VIBRA -TABS) 100 MG tablet Take 1 tablet (100 mg total) by mouth 2 (two) times daily. (Patient not taking: Reported on 04/05/2023) 14 tablet 0   escitalopram  (LEXAPRO ) 20 MG tablet Take 1 tablet (20 mg total) by mouth daily. 90 tablet 1   fexofenadine (ALLEGRA) 60 MG tablet Take 60 mg by mouth 2 (two) times daily.     fluticasone -salmeterol (ADVAIR HFA)  115-21 MCG/ACT inhaler Inhale 2 puffs into the lungs 2 (two) times daily. 1 each 12   hydrochlorothiazide  (HYDRODIURIL ) 25 MG tablet Take 1 tablet (25 mg total) by mouth daily. 90 tablet 3   metFORMIN  (GLUCOPHAGE ) 1000 MG tablet TAKE 1 TABLET BY MOUTH TWICE DAILY WITH MEALS. 180 tablet 1   omeprazole  (PRILOSEC) 20 MG capsule TAKE ONE CAPSULE BY MOUTH TWICE DAILY 180 capsule 3   sitaGLIPtin  (JANUVIA ) 100 MG tablet TAKE (1) TABLET BY MOUTH ONCE DAILY. 90 tablet 1    tamoxifen  (NOLVADEX ) 20 MG tablet TAKE (1) TABLET BY MOUTH ONCE DAILY. 90 tablet 0   No current facility-administered medications on file prior to visit.   Allergies  Allergen Reactions   Corn-Containing Products Itching    Running nose   Tomato Itching    Running nose    Azithromycin Itching and Rash   Biaxin [Clarithromycin] Rash   Codeine Rash    Thinks a codeine cough syrup caused rash.   Social History   Socioeconomic History   Marital status: Married    Spouse name: [name]   Number of children: 1   Years of education: Not on file   Highest education level: Not on file  Occupational History   Not on file  Tobacco Use   Smoking status: Never   Smokeless tobacco: Never  Vaping Use   Vaping status: Never Used  Substance and Sexual Activity   Alcohol use: No   Drug use: No   Sexual activity: Yes  Other Topics Concern   Not on file  Social History Narrative   Married x 37 years in 2022.   1 daughter Ruthann Cover   1 granddaughter, Loris Ros   Social Drivers of Health   Financial Resource Strain: Low Risk  (12/15/2023)   Overall Financial Resource Strain (CARDIA)    Difficulty of Paying Living Expenses: Not hard at all  Food Insecurity: No Food Insecurity (12/15/2023)   Hunger Vital Sign    Worried About Running Out of Food in the Last Year: Never true    Ran Out of Food in the Last Year: Never true  Transportation Needs: No Transportation Needs (12/15/2023)   PRAPARE - Administrator, Civil Service (Medical): No    Lack of Transportation (Non-Medical): No  Physical Activity: Sufficiently Active (12/15/2023)   Exercise Vital Sign    Days of Exercise per Week: 4 days    Minutes of Exercise per Session: 40 min  Stress: No Stress Concern Present (12/15/2023)   [employer] of Occupational Health - Occupational Stress Questionnaire    Feeling of Stress : Not at all  Social Connections: Socially Integrated (12/15/2023)   Social Connection and Isolation Panel  [NHANES]    Frequency of Communication with Friends and Family: More than three times a week    Frequency of Social Gatherings with Friends and Family: More than three times a week    Attends Religious Services: More than 4 times per year    Active Member of [employer] or Organizations: Yes    Attends Banker Meetings: More than 4 times per year    Marital Status: Married  Catering manager Violence: Not At Risk (12/15/2023)   Humiliation, Afraid, Rape, and Kick questionnaire    Fear of Current or Ex-Partner: No    Emotionally Abused: No    Physically Abused: No    Sexually Abused: No   Family History  Problem Relation Age of Onset   Arthritis Mother  Asthma Mother    Diabetes Mother    Hyperlipidemia Mother    Hypertension Mother    Arthritis Father    Asthma Father    Stroke Father    Hyperlipidemia Father    Early death Brother 8       Muscular Dystrophy   Birth defects Brother    Cancer Maternal Uncle        possibly stomach   Mental retardation Maternal Uncle    Cancer Paternal Grandmother 62       breast cancer    Cancer Cousin 56       paternal cousin's daughter with breast cancer   Colon cancer Maternal Aunt        dx in her 79s   Leukemia Paternal Uncle    Lymphoma Paternal Uncle    Down syndrome Paternal Uncle       Review of Systems  All other systems reviewed and are negative.      Objective:   Physical Exam Vitals reviewed.  Constitutional:      General: She is not in acute distress.    Appearance: She is well-developed. She is not diaphoretic.  Cardiovascular:     Rate and Rhythm: Normal rate and regular rhythm.     Heart sounds: Normal heart sounds. No murmur heard.    No friction rub. No gallop.  Pulmonary:     Effort: Pulmonary effort is normal. No respiratory distress.     Breath sounds: Normal breath sounds. No wheezing or rales.  Chest:     Chest wall: No tenderness.  Musculoskeletal:        General: No tenderness or  deformity. Normal range of motion.     Cervical back: Normal range of motion and neck supple.  Skin:    General: Skin is warm.     Coloration: Skin is not pale.     Findings: No erythema or rash.           Assessment & Plan:  Controlled type 2 diabetes mellitus without complication, without long-term current use of insulin  (HCC) - Plan: Hemoglobin A1c, CBC with Differential/Platelet, COMPLETE METABOLIC PANEL WITHOUT GFR, Lipid panel, Microalbumin/Creatinine Ratio, Urine Blood pressure is excellent.  Check hemoglobin A1c.  Goal A1c is less than 6.5.  Consider switching Januvia  to [facility] would like him to measure blood sugar.  Goal LDL cholesterol is less than 100.  Check urine protein creatinine ratio.  Goal is less than 30.  If significantly elevated consider Farxiga

## 2023-12-24 LAB — CBC WITH DIFFERENTIAL/PLATELET
Absolute Lymphocytes: 1542 {cells}/uL (ref 850–3900)
Absolute Monocytes: 582 {cells}/uL (ref 200–950)
Basophils Absolute: 38 {cells}/uL (ref 0–200)
Basophils Relative: 0.6 %
Eosinophils Absolute: 320 {cells}/uL (ref 15–500)
Eosinophils Relative: 5 %
HCT: 39.2 % (ref 35.0–45.0)
Hemoglobin: 12.8 g/dL (ref 11.7–15.5)
MCH: 31.5 pg (ref 27.0–33.0)
MCHC: 32.7 g/dL (ref 32.0–36.0)
MCV: 96.6 fL (ref 80.0–100.0)
MPV: 10.6 fL (ref 7.5–12.5)
Monocytes Relative: 9.1 %
Neutro Abs: 3917 {cells}/uL (ref 1500–7800)
Neutrophils Relative %: 61.2 %
Platelets: 294 10*3/uL (ref 140–400)
RBC: 4.06 10*6/uL (ref 3.80–5.10)
RDW: 12.5 % (ref 11.0–15.0)
Total Lymphocyte: 24.1 %
WBC: 6.4 10*3/uL (ref 3.8–10.8)

## 2023-12-24 LAB — LIPID PANEL
Cholesterol: 139 mg/dL (ref <200)
HDL: 36 mg/dL — ABNORMAL LOW (ref >=50)
LDL Cholesterol (Calc): 76 mg/dL
Non-HDL Cholesterol (Calc): 103 mg/dL (ref <130)
Total CHOL/HDL Ratio: 3.9 (calc) (ref <5.0)
Triglycerides: 173 mg/dL — ABNORMAL HIGH (ref <150)

## 2023-12-24 LAB — MICROALBUMIN / CREATININE URINE RATIO
Creatinine, Urine: 148 mg/dL (ref 20–275)
Microalb Creat Ratio: 3 mg/g{creat} (ref <30)
Microalb, Ur: 0.5 mg/dL

## 2023-12-24 LAB — COMPLETE METABOLIC PANEL WITHOUT GFR
AG Ratio: 1.3 (calc) (ref 1.0–2.5)
ALT: 55 U/L — ABNORMAL HIGH (ref 6–29)
AST: 42 U/L — ABNORMAL HIGH (ref 10–35)
Albumin: 3.7 g/dL (ref 3.6–5.1)
Alkaline phosphatase (APISO): 88 U/L (ref 37–153)
BUN: 11 mg/dL (ref 7–25)
CO2: 29 mmol/L (ref 20–32)
Calcium: 8.4 mg/dL — ABNORMAL LOW (ref 8.6–10.4)
Chloride: 100 mmol/L (ref 98–110)
Creat: 0.63 mg/dL (ref 0.50–1.05)
Globulin: 2.9 g/dL (ref 1.9–3.7)
Glucose, Bld: 201 mg/dL — ABNORMAL HIGH (ref 65–99)
Potassium: 4 mmol/L (ref 3.5–5.3)
Sodium: 138 mmol/L (ref 135–146)
Total Bilirubin: 0.3 mg/dL (ref 0.2–1.2)
Total Protein: 6.6 g/dL (ref 6.1–8.1)

## 2023-12-24 LAB — HEMOGLOBIN A1C
Hgb A1c MFr Bld: 9.4 % — ABNORMAL HIGH (ref <5.7)
Mean Plasma Glucose: 223 mg/dL
eAG (mmol/L): 12.4 mmol/L

## 2023-12-26 ENCOUNTER — Other Ambulatory Visit: Payer: Self-pay

## 2023-12-26 DIAGNOSIS — R9389 Abnormal findings on diagnostic imaging of other specified body structures: Secondary | ICD-10-CM | POA: Diagnosis not present

## 2023-12-26 DIAGNOSIS — N840 Polyp of corpus uteri: Secondary | ICD-10-CM | POA: Diagnosis not present

## 2023-12-26 MED ORDER — PIOGLITAZONE HCL 30 MG PO TABS: 30.0000 mg | ORAL_TABLET | Freq: Every day | ORAL | 0 refills | Status: DC

## 2023-12-26 MED ORDER — TIRZEPATIDE 2.5 MG/0.5ML ~~LOC~~ SOAJ: 2.5000 mg | SUBCUTANEOUS | 1 refills | Status: DC

## 2023-12-27 ENCOUNTER — Other Ambulatory Visit: Payer: Self-pay | Admitting: Nurse Practitioner

## 2023-12-28 ENCOUNTER — Telehealth: Payer: Self-pay

## 2023-12-28 NOTE — Telephone Encounter (Signed)
 Carolyn Glass (Key: [id number])  Your information has been sent to [facility].

## 2023-12-28 NOTE — H&P (Signed)
 Patient name   Carolyn Glass DICTATION# [id number] VWU#[id number]  Merryl Abraham, MD 12/28/2023 6:14 AM

## 2023-12-28 NOTE — H&P (Signed)
 Carolyn Glass, Carolyn Glass MEDICAL RECORD NO: [id number] ACCOUNT NO: [id number] DATE OF BIRTH: Jan 12, 1956 PHYSICIAN: Chandler Combs, MD  History and Physical   DATE OF ADMISSION: 01/05/2024  Day of surgery is 01/05/2024.  HISTORY OF PRESENT ILLNESS:  The patient is a 68 year old female that comes in for hysteroscopy and D and C.  The patient did have an ultrasound performed on 11/24/2023 that revealed a thickened endometrium, possible endometrial polyp.  We were doing  this because we planned surgery on her for pelvic relaxation.  We did a saline infusion ultrasound that confirmed the presence of an endometrial polyp.  She now presents for hysteroscopic resection.  ALLERGIES:  SHE IS ALLERGIC TO ZITHROMAX.  MEDICATIONS:  She uses an Advil inhaler.  She is on albuterol .  She is on hydrochlorothiazide  25 mg tablets, Januvia  100 mg tablet daily. Lotensin .  She is taking metformin . She is on tamoxifen .  PAST MEDICAL HISTORY: She is being managed for diabetes. She is on medication in the form of metformin  and Januvia . She is also being managed for hypertension. She has had a double mastectomy for breast cancer in the right breast. Genetic testing came  back negative. She is clinically free of disease at the present time, but remains on tamoxifen .  Past medical history is as noted above.  SOCIAL HISTORY:  Reveals no tobacco or alcohol use.  FAMILY HISTORY:  Noncontributory.  REVIEW OF SYSTEMS:  Noncontributory.  PHYSICAL EXAMINATION: VITAL SIGNS:  The patient is afebrile, stable vital signs. HEENT:  The patient is normocephalic.  Pupils equal, round, and reactive to light and accommodation.  Extraocular movements were intact.  Sclerae and conjunctiva were clear.  Oropharynx was clear. LUNGS:  Clear. CARDIOVASCULAR:  Regular rhythm and rate without murmurs or gallops. ABDOMEN:  Benign.  No masses, organomegaly, or tenderness. PELVIC:  Normal external genitalia.  Vaginal mucosa is clear.   Cervix unremarkable.  Uterus upper limits of normal size.  Normal shape and contour.  Adnexa free of masses or tenderness.  ASSESSMENT:  Endometrial polyp.  PLAN:  The patient will undergo hysteroscopy with resection of the endometrial polyp.  The nature of the procedure has been discussed. The risks have been explained, including the risk of infection. Risk could lead to hemorrhage requiring transfusion or  possible hysterectomy. Risk of injury to adjacent organs, including bladder, bowel, or ureters that could require further exploratory surgery.   Risk of deep venous thrombosis and pulmonary embolus. The patient expressed understanding of the indications  and risks.       PAA D: 12/28/2023 6:11:07 am T: 12/28/2023 6:52:00 am  JOB: [id number]/ [id number]

## 2023-12-30 ENCOUNTER — Telehealth: Payer: Self-pay

## 2023-12-30 NOTE — Telephone Encounter (Signed)
 Copied from CRM [id number]. Topic: General - Other >> Dec 30, 2023 10:09 AM Emylou G wrote: Reason for CRM: Patient called.. wants to know if she can finish sitaGLIPtin  (JANUVIA ) 100 MG tablet the rest she has before she starts her new meds.. she didn't want it to go to waste.. number on file is good

## 2024-01-03 NOTE — Anesthesia Preprocedure Evaluation (Addendum)
 Anesthesia Evaluation  Patient identified by MRN, date of birth, ID band Patient awake    Reviewed: Allergy & Precautions, H&P , NPO status , Patient's Chart, lab work & pertinent test results  Airway Mallampati: II   Neck ROM: full    Dental   Pulmonary asthma    breath sounds clear to auscultation       Cardiovascular hypertension, Pt. on medications  Rhythm:regular Rate:Normal     Neuro/Psych  PSYCHIATRIC DISORDERS Anxiety Depression    negative neurological ROS     GI/Hepatic Neg liver ROS,GERD  Medicated and Controlled,,  Endo/Other  diabetes, Well Controlled, Type 2, Oral Hypoglycemic Agents    Renal/GU negative Renal ROS  negative genitourinary   Musculoskeletal  (+) Arthritis , Osteoarthritis,    Abdominal   Peds  Hematology negative hematology ROS (+) Hb 12.8   Anesthesia Other Findings   Reproductive/Obstetrics negative OB ROS                             Anesthesia Physical Anesthesia Plan  ASA: 2  Anesthesia Plan: General   Post-op Pain Management: Tylenol  PO (pre-op)*   Induction: Intravenous  PONV Risk Score and Plan: 4 or greater and Ondansetron , Dexamethasone , Midazolam  and Treatment may vary due to age or medical condition  Airway Management Planned: LMA  Additional Equipment: None  Intra-op Plan:   Post-operative Plan: Extubation in OR  Informed Consent: I have reviewed the patients History and Physical, chart, labs and discussed the procedure including the risks, benefits and alternatives for the proposed anesthesia with the patient or authorized representative who has indicated his/her understanding and acceptance.     Dental advisory given  Plan Discussed with: CRNA, Anesthesiologist and Surgeon  Anesthesia Plan Comments:        Anesthesia Quick Evaluation

## 2024-01-04 ENCOUNTER — Other Ambulatory Visit: Payer: Self-pay

## 2024-01-04 ENCOUNTER — Encounter (HOSPITAL_COMMUNITY): Payer: Self-pay | Admitting: Obstetrics and Gynecology

## 2024-01-04 NOTE — Progress Notes (Signed)
 PCP - Dr Eliane Grooms Cardiologist - none Oncology - Dr Sonja [place] Plastic - Dr Alger Infield  Chest x-ray - n/a EKG - DOS Stress Test - n/a ECHO - n/a Cardiac Cath - n/a  ICD Pacemaker/Loop - n/a  Sleep Study -  n/a  Diabetes Type 2, does not check blood sugar Do not take Metformin  or ACTOS  on the morning of surgery.  Patient is not taking Januvia  or Mounjaro.    Aspirin & Blood Thinner Instructions:  n/a  NPO  Anesthesia review: Yes  STOP now taking any Aspirin (unless otherwise instructed by your surgeon), Aleve, Naproxen, Ibuprofen, Motrin, Advil, Goody's, BC's, all herbal medications, fish oil, and all vitamins.   Coronavirus Screening Do you have any of the following symptoms:  Cough yes/no: No Fever (>100.38F)  yes/no: No Runny nose yes/no: No Sore throat occasional Difficulty breathing/shortness of breath  yes/no: No  Have you traveled in the last 14 days and where? yes/no: No  Patient verbalized understanding of instructions that were given via phone.

## 2024-01-04 NOTE — Progress Notes (Signed)
 Anesthesia Chart Review:  Case: [id number] Date/Time: 01/05/24 0715   Procedures:      DILATATION & CURETTAGE/HYSTEROSCOPY WITH RESECTOCOPE - POSSIBLE MYOSURE     MYOSURE RESECTION   Anesthesia type: General   Diagnosis: Endometrial polyp [N84.0]   Pre-op diagnosis: POLYP   [facility] / [facility]   Surgeons: Merryl Abraham, MD       DISCUSSION: Patient is a 68 year old female scheduled for the above procedure.  History includes never smoker, HTN, DM2, asthma, GERD, right breast cancer (s/p bilateral mastectomies with reconstruction 08/13/16), osteoarthritis (right THA 01/07/16; left THA 03/22/18).  A1c 9.4% on 12/23/23. Dr. Cheril Cork had her stop Januvia  and start Actos  and Mounjaro. She is not on Mounjaro yet, but she is on metformin  and Actos .  She is a same day work-up. She had recent labs. EKG as indicated on arrival.   VS: Ht 5\' 3"  (1.6 m)   Wt 74.8 kg   BMI 29.23 kg/m  BP Readings from Last 3 Encounters:  12/23/23 120/70  04/05/23 114/61  12/17/22 120/76   Pulse Readings from Last 3 Encounters:  12/23/23 90  04/05/23 78  12/17/22 100     PROVIDERS: Austine Lefort, MD is PCP  Sonja [place], MD is HEM-ONC  LABS: Most recent lab results in [facility] include: Lab Results  Component Value Date   WBC 6.4 12/23/2023   HGB 12.8 12/23/2023   HCT 39.2 12/23/2023   PLT 294 12/23/2023   GLUCOSE 201 (H) 12/23/2023   CHOL 139 12/23/2023   TRIG 173 (H) 12/23/2023   HDL 36 (L) 12/23/2023   LDLCALC 76 12/23/2023   ALT 55 (H) 12/23/2023   AST 42 (H) 12/23/2023   NA 138 12/23/2023   K 4.0 12/23/2023   CL 100 12/23/2023   CREATININE 0.63 12/23/2023   BUN 11 12/23/2023   CO2 29 12/23/2023   HGBA1C 9.4 (H) 12/23/2023   MICROALBUR 0.5 12/23/2023     EKG: On day of surgery as indicated. Last tracing noted on 03/13/18 showed NSR.   CV: N/A  Past Medical History:  Diagnosis Date   Anxiety    Arthritis    OA hips   Asthma    allergy and exercise induced asthma     Depression    Diabetes mellitus without complication (HCC)    type 2, does not check blood sugar   Family history of breast cancer    Family history of colon cancer    Family history of stomach cancer    GERD (gastroesophageal reflux disease)    Hypertension    Malignant neoplasm of lower-outer quadrant of right female breast (HCC) 06/21/2016   Seasonal allergies    Uterine fibroid 11/2023    Past Surgical History:  Procedure Laterality Date   BREAST LUMPECTOMY     right    BREAST RECONSTRUCTION WITH PLACEMENT OF TISSUE EXPANDER AND FLEX HD (ACELLULAR HYDRATED DERMIS) Bilateral 08/13/2016   Procedure: BREAST RECONSTRUCTION WITH PLACEMENT OF TISSUE EXPANDER AND ALLODERM;  Surgeon: Alger Infield, MD;  Location: [facility];  Service: Plastics;  Laterality: Bilateral;   COLONOSCOPY N/A 10/31/2017   Procedure: COLONOSCOPY;  Surgeon: Alyce Jubilee, MD;  [facility];  Service: Endoscopy;  Laterality: N/A;  8:30 Am   DILATION AND CURETTAGE OF UTERUS     JOINT REPLACEMENT     LIPOSUCTION WITH LIPOFILLING Bilateral 11/05/2016   Procedure: LIPOFILLING FROM ABDOMEN TO BILATERAL CHEST;  Surgeon: Alger Infield, MD;  Location:  [facility];  Service: Plastics;  Laterality: Bilateral;   MASTECTOMY W/ SENTINEL NODE BIOPSY Bilateral 08/13/2016   Procedure: BILATERAL TOTAL MASTECTOMY WITH RIGHT AXILLARY SENTINEL LYMPH NODE BIOPSY;  Surgeon: Ayesha Lente, MD;  Location: [facility];  Service: General;  Laterality: Bilateral;   REMOVAL OF BILATERAL TISSUE EXPANDERS WITH PLACEMENT OF BILATERAL BREAST IMPLANTS Bilateral 11/05/2016   Procedure: REMOVAL OF BILATERAL TISSUE EXPANDERS WITH PLACEMENT OF BILATERAL BREAST SALINE IMPLANTS;  Surgeon: Alger Infield, MD;  Location: [facility];  Service: Plastics;  Laterality: Bilateral;   TOTAL HIP ARTHROPLASTY Right 01/07/2016   Procedure: TOTAL HIP ARTHROPLASTY ANTERIOR APPROACH;  Surgeon:  Liliane Rei, MD;  [facility];  Service: Orthopedics;  Laterality: Right;   TOTAL HIP ARTHROPLASTY Left 03/22/2018   Procedure: LEFT TOTAL HIP ARTHROPLASTY ANTERIOR APPROACH;  Surgeon: Liliane Rei, MD;  [facility];  Service: Orthopedics;  Laterality: Left;    MEDICATIONS: No current facility-administered medications for this encounter.    albuterol  (VENTOLIN  HFA) 108 (90 Base) MCG/ACT inhaler   benazepril  (LOTENSIN ) 20 MG tablet   Calcium Carb-Cholecalciferol (CALCIUM+D3 PO)   Cyanocobalamin  (B-12 PO)   escitalopram  (LEXAPRO ) 20 MG tablet   fexofenadine (ALLEGRA) 180 MG tablet   fluticasone -salmeterol (ADVAIR HFA) 115-21 MCG/ACT inhaler   hydrochlorothiazide  (HYDRODIURIL ) 25 MG tablet   metFORMIN  (GLUCOPHAGE ) 1000 MG tablet   omeprazole  (PRILOSEC) 20 MG capsule   sitaGLIPtin  (JANUVIA ) 100 MG tablet   tamoxifen  (NOLVADEX ) 20 MG tablet   pioglitazone  (ACTOS ) 30 MG tablet   tirzepatide (MOUNJARO) 2.5 MG/0.5ML Pen    Ella Gun, PA-C Surgical Short Stay/Anesthesiology [facility] Phone [phone] [facility] Phone [phone] 01/04/2024 1:56 PM

## 2024-01-05 ENCOUNTER — Encounter (HOSPITAL_COMMUNITY): Payer: Self-pay | Admitting: Obstetrics and Gynecology

## 2024-01-05 ENCOUNTER — Telehealth: Payer: Self-pay

## 2024-01-05 ENCOUNTER — Ambulatory Visit (HOSPITAL_COMMUNITY)
Admission: RE | Admit: 2024-01-05 | Discharge: 2024-01-05 | Disposition: A | Discharge status: Home / Self Care | Attending: Obstetrics and Gynecology | Admitting: Obstetrics and Gynecology

## 2024-01-05 ENCOUNTER — Ambulatory Visit (HOSPITAL_BASED_OUTPATIENT_CLINIC_OR_DEPARTMENT_OTHER): Payer: Self-pay | Admitting: Anesthesiology

## 2024-01-05 ENCOUNTER — Other Ambulatory Visit: Payer: Self-pay

## 2024-01-05 ENCOUNTER — Encounter (HOSPITAL_COMMUNITY)
Admission: RE | Disposition: A | Discharge status: Home / Self Care | Payer: Self-pay | Source: Home / Self Care | Attending: Obstetrics and Gynecology

## 2024-01-05 ENCOUNTER — Ambulatory Visit (HOSPITAL_COMMUNITY): Payer: Self-pay | Admitting: Anesthesiology

## 2024-01-05 DIAGNOSIS — R9389 Abnormal findings on diagnostic imaging of other specified body structures: Secondary | ICD-10-CM | POA: Diagnosis not present

## 2024-01-05 DIAGNOSIS — N840 Polyp of corpus uteri: Secondary | ICD-10-CM | POA: Diagnosis not present

## 2024-01-05 DIAGNOSIS — Z853 Personal history of malignant neoplasm of breast: Secondary | ICD-10-CM | POA: Insufficient documentation

## 2024-01-05 DIAGNOSIS — E119 Type 2 diabetes mellitus without complications: Secondary | ICD-10-CM | POA: Diagnosis not present

## 2024-01-05 DIAGNOSIS — Z01818 Encounter for other preprocedural examination: Secondary | ICD-10-CM

## 2024-01-05 DIAGNOSIS — E1165 Type 2 diabetes mellitus with hyperglycemia: Secondary | ICD-10-CM

## 2024-01-05 DIAGNOSIS — K219 Gastro-esophageal reflux disease without esophagitis: Secondary | ICD-10-CM | POA: Insufficient documentation

## 2024-01-05 DIAGNOSIS — Z9013 Acquired absence of bilateral breasts and nipples: Secondary | ICD-10-CM | POA: Insufficient documentation

## 2024-01-05 DIAGNOSIS — Z7984 Long term (current) use of oral hypoglycemic drugs: Secondary | ICD-10-CM | POA: Insufficient documentation

## 2024-01-05 DIAGNOSIS — J45909 Unspecified asthma, uncomplicated: Secondary | ICD-10-CM | POA: Insufficient documentation

## 2024-01-05 DIAGNOSIS — I10 Essential (primary) hypertension: Secondary | ICD-10-CM | POA: Insufficient documentation

## 2024-01-05 HISTORY — PX: DILATATION & CURRETTAGE/HYSTEROSCOPY WITH RESECTOCOPE: SHX5572

## 2024-01-05 LAB — CBC
HCT: 40.9 % (ref 36.0–46.0)
Hemoglobin: 13.6 g/dL (ref 12.0–15.0)
MCH: 31.5 pg (ref 26.0–34.0)
MCHC: 33.3 g/dL (ref 30.0–36.0)
MCV: 94.7 fL (ref 80.0–100.0)
Platelets: 265 10*3/uL (ref 150–400)
RBC: 4.32 MIL/uL (ref 3.87–5.11)
RDW: 13 % (ref 11.5–15.5)
WBC: 5.8 10*3/uL (ref 4.0–10.5)
nRBC: 0 % (ref 0.0–0.2)

## 2024-01-05 LAB — TYPE AND SCREEN
ABO/RH(D): O POS
Antibody Screen: NEGATIVE

## 2024-01-05 LAB — GLUCOSE, CAPILLARY
Glucose-Capillary: 239 mg/dL — ABNORMAL HIGH (ref 70–99)
Glucose-Capillary: 265 mg/dL — ABNORMAL HIGH (ref 70–99)

## 2024-01-05 LAB — HIV ANTIBODY (ROUTINE TESTING W REFLEX): HIV Screen 4th Generation wRfx: NONREACTIVE

## 2024-01-05 SURGERY — DILATATION & CURETTAGE/HYSTEROSCOPY WITH RESECTOCOPE
Anesthesia: General | Site: Uterus

## 2024-01-05 MED ORDER — AMISULPRIDE (ANTIEMETIC) 5 MG/2ML IV SOLN: 10.0000 mg | Freq: Once | INTRAVENOUS | Status: DC | PRN

## 2024-01-05 MED ORDER — LANCETS MISC. MISC: 1.0000 | Freq: Three times a day (TID) | 3 refills | Status: AC

## 2024-01-05 MED ORDER — DEXAMETHASONE SODIUM PHOSPHATE 10 MG/ML IJ SOLN
INTRAMUSCULAR | Status: DC | PRN
  Administered 2024-01-05: 5 mg via INTRAVENOUS

## 2024-01-05 MED ORDER — HYDROMORPHONE HCL 1 MG/ML IJ SOLN: 0.2500 mg | INTRAMUSCULAR | Status: DC | PRN

## 2024-01-05 MED ORDER — POVIDONE-IODINE 10 % EX SWAB
2.0000 | Freq: Once | CUTANEOUS | Status: AC
  Administered 2024-01-05: 2 via TOPICAL

## 2024-01-05 MED ORDER — FENTANYL CITRATE (PF) 250 MCG/5ML IJ SOLN
INTRAMUSCULAR | Status: AC
  Filled 2024-01-05: qty 5

## 2024-01-05 MED ORDER — LIDOCAINE HCL 1 % IJ SOLN
INTRAMUSCULAR | Status: DC | PRN
  Administered 2024-01-05: 10 mL

## 2024-01-05 MED ORDER — BLOOD GLUCOSE TEST VI STRP: 1.0000 | ORAL_STRIP | Freq: Three times a day (TID) | 3 refills | Status: AC

## 2024-01-05 MED ORDER — ONDANSETRON HCL 4 MG/2ML IJ SOLN
INTRAMUSCULAR | Status: AC
  Filled 2024-01-05: qty 2

## 2024-01-05 MED ORDER — LIDOCAINE 2% (20 MG/ML) 5 ML SYRINGE
INTRAMUSCULAR | Status: DC | PRN
  Administered 2024-01-05: 60 mg via INTRAVENOUS

## 2024-01-05 MED ORDER — ONDANSETRON HCL 4 MG/2ML IJ SOLN: 4.0000 mg | Freq: Once | INTRAMUSCULAR | Status: DC | PRN

## 2024-01-05 MED ORDER — PROPOFOL 10 MG/ML IV BOLUS
INTRAVENOUS | Status: AC
  Filled 2024-01-05: qty 20

## 2024-01-05 MED ORDER — ACETAMINOPHEN 500 MG PO TABS
1000.0000 mg | ORAL_TABLET | Freq: Once | ORAL | Status: AC
  Administered 2024-01-05: 1000 mg via ORAL
  Filled 2024-01-05: qty 2

## 2024-01-05 MED ORDER — OXYCODONE HCL 5 MG/5ML PO SOLN: 5.0000 mg | Freq: Once | ORAL | Status: DC | PRN

## 2024-01-05 MED ORDER — CHLORHEXIDINE GLUCONATE 0.12 % MT SOLN
15.0000 mL | Freq: Once | OROMUCOSAL | Status: AC
  Administered 2024-01-05: 15 mL via OROMUCOSAL
  Filled 2024-01-05: qty 15

## 2024-01-05 MED ORDER — OXYCODONE HCL 5 MG PO TABS: 5.0000 mg | ORAL_TABLET | Freq: Once | ORAL | Status: DC | PRN

## 2024-01-05 MED ORDER — METRONIDAZOLE 500 MG/100ML IV SOLN
500.0000 mg | INTRAVENOUS | Status: DC
  Filled 2024-01-05: qty 100

## 2024-01-05 MED ORDER — CIPROFLOXACIN IN D5W 400 MG/200ML IV SOLN
400.0000 mg | INTRAVENOUS | Status: AC
  Administered 2024-01-05: 400 mg via INTRAVENOUS
  Filled 2024-01-05: qty 200

## 2024-01-05 MED ORDER — LANCET DEVICE MISC: 1.0000 | Freq: Three times a day (TID) | 0 refills | Status: AC

## 2024-01-05 MED ORDER — ORAL CARE MOUTH RINSE: 15.0000 mL | Freq: Once | OROMUCOSAL | Status: AC

## 2024-01-05 MED ORDER — FENTANYL CITRATE (PF) 250 MCG/5ML IJ SOLN
INTRAMUSCULAR | Status: DC | PRN
  Administered 2024-01-05 (×2): 50 ug via INTRAVENOUS

## 2024-01-05 MED ORDER — INSULIN ASPART 100 UNIT/ML IJ SOLN
0.0000 [IU] | INTRAMUSCULAR | Status: DC | PRN
  Administered 2024-01-05: 6 [IU] via SUBCUTANEOUS

## 2024-01-05 MED ORDER — BLOOD GLUCOSE MONITORING SUPPL DEVI: 1.0000 | Freq: Three times a day (TID) | 0 refills | Status: AC

## 2024-01-05 MED ORDER — LIDOCAINE HCL 1 % IJ SOLN
INTRAMUSCULAR | Status: AC
  Filled 2024-01-05: qty 20

## 2024-01-05 MED ORDER — DEXAMETHASONE SODIUM PHOSPHATE 10 MG/ML IJ SOLN
INTRAMUSCULAR | Status: AC
  Filled 2024-01-05: qty 1

## 2024-01-05 MED ORDER — CEFAZOLIN SODIUM-DEXTROSE 2-4 GM/100ML-% IV SOLN
2.0000 g | INTRAVENOUS | Status: DC
  Filled 2024-01-05: qty 100

## 2024-01-05 MED ORDER — LACTATED RINGERS IV SOLN: INTRAVENOUS | Status: DC

## 2024-01-05 MED ORDER — FENTANYL CITRATE (PF) 100 MCG/2ML IJ SOLN: 25.0000 ug | INTRAMUSCULAR | Status: DC | PRN

## 2024-01-05 MED ORDER — ONDANSETRON HCL 4 MG/2ML IJ SOLN
INTRAMUSCULAR | Status: DC | PRN
  Administered 2024-01-05: 4 mg via INTRAVENOUS

## 2024-01-05 MED ORDER — ONDANSETRON HCL 4 MG/2ML IJ SOLN
4.0000 mg | Freq: Four times a day (QID) | INTRAMUSCULAR | Status: DC | PRN
Start: 2024-01-05 — End: 2024-01-05

## 2024-01-05 MED ORDER — INSULIN ASPART 100 UNIT/ML IJ SOLN
INTRAMUSCULAR | Status: AC
  Filled 2024-01-05: qty 1

## 2024-01-05 MED ORDER — PROPOFOL 10 MG/ML IV BOLUS
INTRAVENOUS | Status: DC | PRN
  Administered 2024-01-05: 160 mg via INTRAVENOUS

## 2024-01-05 MED ORDER — MIDAZOLAM HCL 2 MG/2ML IJ SOLN
INTRAMUSCULAR | Status: AC
  Filled 2024-01-05: qty 2

## 2024-01-05 SURGICAL SUPPLY — 13 items
CATH ROBINSON RED A/P 16FR (CATHETERS) ×2 IMPLANT
DEVICE MYOSURE LITE (MISCELLANEOUS) IMPLANT
DEVICE MYOSURE REACH (MISCELLANEOUS) IMPLANT
GLOVE BIO SURGEON STRL SZ7 (GLOVE) ×2 IMPLANT
GLOVE SURG UNDER POLY LF SZ7 (GLOVE) ×2 IMPLANT
GOWN STRL REUS W/ TWL LRG LVL3 (GOWN DISPOSABLE) ×4 IMPLANT
KIT PROCEDURE FLUENT (KITS) ×2 IMPLANT
KIT TURNOVER KIT B (KITS) ×2 IMPLANT
PACK VAGINAL MINOR WOMEN LF (CUSTOM PROCEDURE TRAY) ×2 IMPLANT
PAD OB MATERNITY 11 LF (PERSONAL CARE ITEMS) ×2 IMPLANT
SEAL ROD LENS SCOPE MYOSURE (ABLATOR) ×2 IMPLANT
TOWEL GREEN STERILE FF (TOWEL DISPOSABLE) ×4 IMPLANT
UNDERPAD 30X36 HEAVY ABSORB (UNDERPADS AND DIAPERS) ×2 IMPLANT

## 2024-01-05 NOTE — Anesthesia Postprocedure Evaluation (Signed)
 Anesthesia Post Note  Patient: URIJAH ROSO  Procedure(s) Performed: DILATATION & CURETTAGE/HYSTEROSCOPY WITH RESECTOCOPE (Uterus)     Patient location during evaluation: PACU Anesthesia Type: General Level of consciousness: awake and alert Pain management: pain level controlled Vital Signs Assessment: post-procedure vital signs reviewed and stable Respiratory status: spontaneous breathing, nonlabored ventilation, respiratory function stable and patient connected to nasal cannula oxygen Cardiovascular status: blood pressure returned to baseline and stable Postop Assessment: no apparent nausea or vomiting Anesthetic complications: no   No notable events documented.  Last Vitals:  Vitals:   01/05/24 0755 01/05/24 0830  BP: (!) 103/54 127/77  Pulse:  80  Resp:  (!) 21  Temp: 36.7 C 36.7 C  SpO2:  93%    Last Pain:  Vitals:   01/05/24 0830  TempSrc:   PainSc: 0-No pain                 Kateria Cutrona S

## 2024-01-05 NOTE — Anesthesia Procedure Notes (Signed)
 Procedure Name: LMA Insertion Date/Time: 01/05/2024 7:25 AM  Performed by: Colen Daunt, CRNAPre-anesthesia Checklist: Patient identified, Emergency Drugs available, Suction available and Patient being monitored Patient Re-evaluated:Patient Re-evaluated prior to induction Oxygen Delivery Method: Circle System Utilized Preoxygenation: Pre-oxygenation with 100% oxygen Induction Type: IV induction Ventilation: Mask ventilation without difficulty LMA: LMA inserted LMA Size: 4.0 Number of attempts: 1 Placement Confirmation: positive ETCO2 Tube secured with: Tape Dental Injury: Teeth and Oropharynx as per pre-operative assessment

## 2024-01-05 NOTE — Procedures (Signed)
 Patient name   Carolyn Glass DICTATION# [id number] VWU#[id number]  Merryl Abraham, MD 01/05/2024 7:47 AM

## 2024-01-05 NOTE — H&P (Signed)
  History and physical exam unchanged 

## 2024-01-05 NOTE — Op Note (Signed)
 Carolyn Glass, LAMKINS MEDICAL RECORD NO: [id number] ACCOUNT NO: [id number] DATE OF BIRTH: 06/14/1956 FAC[facility] LOCATION: [facility] PHYSICIAN: Chandler Combs, MD  Operative Report   DATE OF PROCEDURE: 01/05/2024  PREOPERATIVE DIAGNOSIS:  Endometrial polyp.  POSTOPERATIVE DIAGNOSIS:  Endometrial thickening.  SURGEON:  Chandler Combs, MD  PROCEDURE:  Cervical dilation with hysteroscopy and endometrial curettings.   ANESTHESIA:  General endotracheal.  ESTIMATED BLOOD LOSS:  Minimal.  PACKS AND DRAINS:  None.  INTRAOPERATIVE BLOOD REPLACEMENT:  None.  COMPLICATIONS:  None.  INDICATIONS:  Dictated in the history and physical.  DESCRIPTION OF PROCEDURE:  The patient was taken to the OR and placed in the supine position.  After a satisfactory level of general anesthesia was obtained, she was placed in the dorsal lithotomy position using the Allen stirrups.  The perineum and  vagina prepped out with Betadine .  Bladder was emptied via catheterization.  The patient was then draped in a sterile field.  A speculum was placed in the vaginal vault.  The cervix was grasped with a single-tooth tenaculum.  A paracervical block of 1%  Xylocaine  was instituted.  Uterus sounded to approximately 10 cm.  Cervix was sterilely dilated.  Hysteroscope was introduced.  Endometrium revealed a slight thickening, but no definitive polyp was noted throughout close observation.  The hysteroscope  was then removed.  Endometrial curettings were obtained and sent for pathological review.  Repeat hysteroscopy revealed no additional thickening or polyp-like outgrowth.  At this point in time, the hysteroscope was removed.  Single-tooth tenaculum was  removed.  Speculum was then removed. The patient was taken out of the dorsal lithotomy position. Once alert and extubated, transferred to the recovery room in good condition. Sponge, instrument, and needle counts were correct by circulating nurse x2.   [facility] D:  01/05/2024 7:45:36 am T: 01/05/2024 8:12:00 am  JOB: [id number]/ [id number]

## 2024-01-05 NOTE — Telephone Encounter (Signed)
 Pt's husband came by and states pt's blood sugar was 400 after surgical procedure this morning. Andy Bannister asks if a glucometer can be sent in to pharmacy for patient? Sample of [employer] given to get patient started and rx for monitor, testing strips and lancets sent to [facility]. Pt's husband aware. Mjp,lpn

## 2024-01-05 NOTE — Transfer of Care (Signed)
 Immediate Anesthesia Transfer of Care Note  Patient: Carolyn Glass  Procedure(s) Performed: DILATATION & CURETTAGE/HYSTEROSCOPY WITH RESECTOCOPE (Uterus)  Patient Location: PACU  Anesthesia Type:General  Level of Consciousness: drowsy  Airway & Oxygen Therapy: Patient Spontanous Breathing and Patient connected to face mask oxygen  Post-op Assessment: Report given to RN and Post -op Vital signs reviewed and stable  Post vital signs: Reviewed and stable  Last Vitals:  Vitals Value Taken Time  BP 103/54 01/05/24 0750  Temp    Pulse 76 01/05/24 0753  Resp 13 01/05/24 0753  SpO2 98 % 01/05/24 0753  Vitals shown include unfiled device data.  Last Pain:  Vitals:   01/05/24 0644  TempSrc:   PainSc: 0-No pain         Complications: No notable events documented.

## 2024-01-06 ENCOUNTER — Encounter (HOSPITAL_COMMUNITY): Payer: Self-pay | Admitting: Obstetrics and Gynecology

## 2024-01-06 LAB — SURGICAL PATHOLOGY

## 2024-02-08 DIAGNOSIS — N814 Uterovaginal prolapse, unspecified: Secondary | ICD-10-CM | POA: Diagnosis not present

## 2024-02-08 LAB — HM DEXA SCAN

## 2024-02-27 DIAGNOSIS — N958 Other specified menopausal and perimenopausal disorders: Secondary | ICD-10-CM | POA: Diagnosis not present

## 2024-02-27 DIAGNOSIS — M8588 Other specified disorders of bone density and structure, other site: Secondary | ICD-10-CM | POA: Diagnosis not present

## 2024-02-27 LAB — DG BONE DENSITY

## 2024-02-28 ENCOUNTER — Other Ambulatory Visit

## 2024-02-28 DIAGNOSIS — E1165 Type 2 diabetes mellitus with hyperglycemia: Secondary | ICD-10-CM | POA: Diagnosis not present

## 2024-02-28 LAB — HEMOGLOBIN A1C
Hgb A1c MFr Bld: 9.5 % — ABNORMAL HIGH (ref <5.7)
Mean Plasma Glucose: 226 mg/dL
eAG (mmol/L): 12.5 mmol/L

## 2024-03-01 ENCOUNTER — Ambulatory Visit: Payer: Self-pay | Admitting: Family Medicine

## 2024-03-12 ENCOUNTER — Other Ambulatory Visit: Payer: Self-pay | Admitting: Family Medicine

## 2024-03-13 ENCOUNTER — Ambulatory Visit (HOSPITAL_COMMUNITY): Admit: 2024-03-13 | Admitting: Obstetrics and Gynecology

## 2024-03-13 DIAGNOSIS — N8182 Incompetence or weakening of pubocervical tissue: Secondary | ICD-10-CM

## 2024-03-13 SURGERY — HYSTERECTOMY, VAGINAL, LAPAROSCOPY-ASSISTED, WITH SALPINGO-OOPHORECTOMY
Anesthesia: General

## 2024-03-23 ENCOUNTER — Encounter: Payer: Self-pay | Admitting: Family Medicine

## 2024-03-23 ENCOUNTER — Ambulatory Visit: Admitting: Family Medicine

## 2024-03-23 VITALS — BP 118/70 | HR 70 | Temp 97.8°F | Ht 63.0 in

## 2024-03-23 DIAGNOSIS — E1165 Type 2 diabetes mellitus with hyperglycemia: Secondary | ICD-10-CM

## 2024-03-23 DIAGNOSIS — Z7984 Long term (current) use of oral hypoglycemic drugs: Secondary | ICD-10-CM

## 2024-03-23 MED ORDER — TIRZEPATIDE 2.5 MG/0.5ML ~~LOC~~ SOAJ: 2.5000 mg | SUBCUTANEOUS | 1 refills | Status: DC

## 2024-03-23 NOTE — Progress Notes (Signed)
 Subjective:    Patient ID: Carolyn Glass, female    DOB: 1956-07-10, 68 y.o.   MRN: [id number]  Patient is here today for a follow-up of her diabetes.  Her hemoglobin A1c was greater than 9 in April.  At that time she was on metformin  and she was on pioglitazone .  I recommended that she discontinue Januvia  and start Mounjaro .  She has not hepatologist there typically around 160.  Her 2-hour postprandial sugars are averaging between 200 and 220.  She is here today to discuss options.  She is wanting to have a hysterectomy however her hemoglobin A1c is out of control. Past Medical History:  Diagnosis Date   Anxiety    Arthritis    OA hips   Asthma    allergy and exercise induced asthma    Depression    Diabetes mellitus without complication (HCC)    type 2, does not check blood sugar   Family history of breast cancer    Family history of colon cancer    Family history of stomach cancer    GERD (gastroesophageal reflux disease)    Hypertension    Malignant neoplasm of lower-outer quadrant of right female breast (HCC) 06/21/2016   Seasonal allergies    Uterine fibroid 11/2023   Past Surgical History:  Procedure Laterality Date   BREAST LUMPECTOMY     right    BREAST RECONSTRUCTION WITH PLACEMENT OF TISSUE EXPANDER AND FLEX HD (ACELLULAR HYDRATED DERMIS) Bilateral 08/13/2016   Procedure: BREAST RECONSTRUCTION WITH PLACEMENT OF TISSUE EXPANDER AND ALLODERM;  Surgeon: Earlis Ranks, MD;  Location: [facility];  Service: Plastics;  Laterality: Bilateral;   COLONOSCOPY N/A 10/31/2017   Procedure: COLONOSCOPY;  Surgeon: Harvey Margo CROME, MD;  [facility];  Service: Endoscopy;  Laterality: N/A;  8:30 Am   DILATATION & CURRETTAGE/HYSTEROSCOPY WITH RESECTOCOPE N/A 01/05/2024   Procedure: DILATATION & CURETTAGE/HYSTEROSCOPY WITH RESECTOCOPE;  Surgeon: Leva Rush, MD;  Location: [facility];  Service: Gynecology;  Laterality: N/A;  POSSIBLE MYOSURE   DILATION AND CURETTAGE  OF UTERUS     JOINT REPLACEMENT     LIPOSUCTION WITH LIPOFILLING Bilateral 11/05/2016   Procedure: LIPOFILLING FROM ABDOMEN TO BILATERAL CHEST;  Surgeon: Earlis Ranks, MD;  Location: [facility];  Service: Plastics;  Laterality: Bilateral;   MASTECTOMY W/ SENTINEL NODE BIOPSY Bilateral 08/13/2016   Procedure: BILATERAL TOTAL MASTECTOMY WITH RIGHT AXILLARY SENTINEL LYMPH NODE BIOPSY;  Surgeon: Morene Olives, MD;  Location: [facility];  Service: General;  Laterality: Bilateral;   REMOVAL OF BILATERAL TISSUE EXPANDERS WITH PLACEMENT OF BILATERAL BREAST IMPLANTS Bilateral 11/05/2016   Procedure: REMOVAL OF BILATERAL TISSUE EXPANDERS WITH PLACEMENT OF BILATERAL BREAST SALINE IMPLANTS;  Surgeon: Earlis Ranks, MD;  Location: [facility];  Service: Plastics;  Laterality: Bilateral;   TOTAL HIP ARTHROPLASTY Right 01/07/2016   Procedure: TOTAL HIP ARTHROPLASTY ANTERIOR APPROACH;  Surgeon: Dempsey Moan, MD;  Location: [facility];  Service: Orthopedics;  Laterality: Right;   TOTAL HIP ARTHROPLASTY Left 03/22/2018   Procedure: LEFT TOTAL HIP ARTHROPLASTY ANTERIOR APPROACH;  Surgeon: Moan Dempsey, MD;  Location: [facility];  Service: Orthopedics;  Laterality: Left;   Current Outpatient Medications on File Prior to Visit  Medication Sig Dispense Refill   albuterol  (VENTOLIN  HFA) 108 (90 Base) MCG/ACT inhaler Inhale 2 puffs into the lungs every 6 (six) hours as needed for wheezing or shortness of breath. 8.5 g 0   benazepril  (LOTENSIN ) 20 MG tablet TAKE (1) TABLET  BY MOUTH TWICE DAILY. 180 tablet 3   Blood Glucose Monitoring Suppl DEVI 1 each by Does not apply route in the morning, at noon, and at bedtime. May substitute to any manufacturer covered by patient's insurance. 1 each 0   Calcium Carb-Cholecalciferol (CALCIUM+D3 PO) Take 1 tablet by mouth daily.     Cyanocobalamin  (B-12 PO) Take 1 capsule by mouth daily.     escitalopram  (LEXAPRO ) 20 MG tablet Take 1 tablet  (20 mg total) by mouth daily. 90 tablet 1   fexofenadine (ALLEGRA) 180 MG tablet Take 180 mg by mouth daily.     fluticasone -salmeterol (ADVAIR HFA) 115-21 MCG/ACT inhaler Inhale 2 puffs into the lungs 2 (two) times daily. (Patient taking differently: Inhale 2 puffs into the lungs 2 (two) times daily as needed (shortness of breath).) 1 each 12   Glucose Blood (BLOOD GLUCOSE TEST STRIPS) STRP 1 each by In Vitro route in the morning, at noon, and at bedtime. May substitute to any manufacturer covered by patient's insurance. 100 strip 3   hydrochlorothiazide  (HYDRODIURIL ) 25 MG tablet Take 1 tablet (25 mg total) by mouth daily. 90 tablet 3   metFORMIN  (GLUCOPHAGE ) 1000 MG tablet TAKE 1 TABLET BY MOUTH TWICE DAILY WITH MEALS. 180 tablet 1   omeprazole  (PRILOSEC) 20 MG capsule TAKE ONE CAPSULE BY MOUTH TWICE DAILY 180 capsule 3   pioglitazone  (ACTOS ) 30 MG tablet Take 1 tablet (30 mg total) by mouth daily. 90 tablet 0   tamoxifen  (NOLVADEX ) 20 MG tablet TAKE (1) TABLET BY MOUTH ONCE DAILY. 90 tablet 0   No current facility-administered medications on file prior to visit.   Allergies  Allergen Reactions   Corn-Containing Products Itching    Running nose   Tomato Itching    Running nose    Azithromycin Itching and Rash   Biaxin [Clarithromycin] Rash   Codeine Rash    Thinks a codeine cough syrup caused rash.   Social History   Socioeconomic History   Marital status: Married    Spouse name: [name]   Number of children: 1   Years of education: Not on file   Highest education level: Not on file  Occupational History   Not on file  Tobacco Use   Smoking status: Never   Smokeless tobacco: Never  Vaping Use   Vaping status: Never Used  Substance and Sexual Activity   Alcohol use: No   Drug use: No   Sexual activity: Not Currently    Birth control/protection: Post-menopausal  Other Topics Concern   Not on file  Social History Narrative   Married x 37 years in 2022.   1 daughter Lyle    1 granddaughter, Morna   Social Drivers of Health   Financial Resource Strain: Low Risk  (12/15/2023)   Overall Financial Resource Strain (CARDIA)    Difficulty of Paying Living Expenses: Not hard at all  Food Insecurity: No Food Insecurity (12/15/2023)   Hunger Vital Sign    Worried About Running Out of Food in the Last Year: Never true    Ran Out of Food in the Last Year: Never true  Transportation Needs: No Transportation Needs (12/15/2023)   PRAPARE - Administrator, Civil Service (Medical): No    Lack of Transportation (Non-Medical): No  Physical Activity: Sufficiently Active (12/15/2023)   Exercise Vital Sign    Days of Exercise per Week: 4 days    Minutes of Exercise per Session: 40 min  Stress: No Stress Concern Present (12/15/2023)  [employer] of Occupational Health - Occupational Stress Questionnaire    Feeling of Stress : Not at all  Social Connections: Socially Integrated (12/15/2023)   Social Connection and Isolation Panel    Frequency of Communication with Friends and Family: More than three times a week    Frequency of Social Gatherings with Friends and Family: More than three times a week    Attends Religious Services: More than 4 times per year    Active Member of [employer] or Organizations: Yes    Attends Engineer, structural: More than 4 times per year    Marital Status: Married  Catering manager Violence: Not At Risk (12/15/2023)   Humiliation, Afraid, Rape, and Kick questionnaire    Fear of Current or Ex-Partner: No    Emotionally Abused: No    Physically Abused: No    Sexually Abused: No   Family History  Problem Relation Age of Onset   Arthritis Mother    Asthma Mother    Diabetes Mother    Hyperlipidemia Mother    Hypertension Mother    Arthritis Father    Asthma Father    Stroke Father    Hyperlipidemia Father    Early death Brother 8       Muscular Dystrophy   Birth defects Brother    Cancer Maternal Uncle         possibly stomach   Mental retardation Maternal Uncle    Cancer Paternal Grandmother 36       breast cancer    Cancer Cousin 1       paternal cousin's daughter with breast cancer   Colon cancer Maternal Aunt        dx in her 68s   Leukemia Paternal Uncle    Lymphoma Paternal Uncle    Down syndrome Paternal Uncle       Review of Systems  All other systems reviewed and are negative.      Objective:   Physical Exam Vitals reviewed.  Constitutional:      General: She is not in acute distress.    Appearance: She is well-developed. She is not diaphoretic.  Cardiovascular:     Rate and Rhythm: Normal rate and regular rhythm.     Heart sounds: Normal heart sounds. No murmur heard.    No friction rub. No gallop.  Pulmonary:     Effort: Pulmonary effort is normal. No respiratory distress.     Breath sounds: Normal breath sounds. No wheezing or rales.  Chest:     Chest wall: No tenderness.  Musculoskeletal:        General: No tenderness or deformity. Normal range of motion.     Cervical back: Normal range of motion and neck supple.  Skin:    General: Skin is warm.     Coloration: Skin is not pale.     Findings: No erythema or rash.           Assessment & Plan:  Uncontrolled type 2 diabetes mellitus with hyperglycemia (HCC) Patient hemoglobin A1c will need to below 7.5 prior to surgery.  She has a recheck hemoglobin A1c in October.  I explained to the patient to have a hemoglobin A1c well-controlled, her fasting blood sugars need to be less than 130.  Her 2-hour postprandial sugars need to be less than 160.  Begin Mounjaro  2.5 mg subcu weekly.  Uptitrate every month as tolerated.  Hopefully with weight loss, dietary changes, and titration of Mounjaro , we will achieve good  control of her blood sugars by October.  She will notify me if her blood sugars are well outside the range that I provided for her prior to that

## 2024-03-29 ENCOUNTER — Other Ambulatory Visit: Payer: Self-pay | Admitting: Family Medicine

## 2024-03-29 ENCOUNTER — Other Ambulatory Visit: Payer: Self-pay | Admitting: Hematology

## 2024-04-03 ENCOUNTER — Other Ambulatory Visit: Payer: Self-pay | Admitting: Nurse Practitioner

## 2024-04-03 DIAGNOSIS — M8580 Other specified disorders of bone density and structure, unspecified site: Secondary | ICD-10-CM

## 2024-04-03 DIAGNOSIS — Z170 Estrogen receptor positive status [ER+]: Secondary | ICD-10-CM

## 2024-04-03 NOTE — Progress Notes (Unsigned)
 Patient Care Team: Duanne Butler DASEN, MD as PCP - General (Family Medicine) Lanny Callander, MD as Consulting Physician (Hematology) Izell Domino, MD as Attending Physician (Radiation Oncology) Crawford Morna Pickle, NP as Nurse Practitioner (Hematology and Oncology) Arelia Filippo, MD as Consulting Physician (Plastic Surgery) [facility], [facility]   Clinic Day:  04/04/2024  Referring physician: Duanne Butler DASEN, MD  ASSESSMENT & PLAN:   Assessment & Plan: Malignant neoplasm of lower-outer quadrant of right female breast [name]  daily.  -episodes of loose or watery stools in the mornings. Becomes normal after that.  -weight gain over past 3 months.  -generally tolerating well.  -continue for total of 7 to 10 years.  - DEXA scan done in June 2025 showing generalized osteopenia.  She is currently taking calcium and vitamin D  and is very active.  Plans to begin with light weightlifting to improve muscle tone.  Vitamin D  level checked today and will treat deficiency as indicated.  Repeat DEXA scan in June 2027. - Exam is benign today.  She continues to tolerate tamoxifen  well.  Consider discontinuing tamoxifen  following hysterectomy and bilateral BSO.  Plan for labs and follow-up in 1 year, sooner if needed.   Osteopenia Patient had DEXA scan in 02/2024.  She has generalized osteopenia.  Currently on tamoxifen  daily.  She takes calcium and vitamin D  every day.  Vitamin D  level checked as part of today's visit.  Will treat with high-dose vitamin D  if indicated.  Recommend she participate in low impact exercises such as walking, swimming, or using elliptical.  Continue calcium and vitamin D  supplementation daily.  Recheck DEXA scan in 2 years.  Uterine prolapse Patient states she was recently seen by her GYN provider.  Uterine prolapse noted along with uterine polyps.  These were removed and biopsied.  All were benign.  She is planning for complete  hysterectomy and BSO, potentially in October.  Diabetes Patient states her HgbA1c was elevated to >9.0 at last visit with primary care.  She was started on Mounjaro .  Her first dose was 2 weeks ago.  Blood sugars are already improving.  She reports feeling less hungry.  Will go back and 3 months to recheck Hgb A1c.  Plan Labs reviewed. - CBC unremarkable. - CMP unremarkable except for blood sugar which was 143. Reviewed DEXA scan showing generalized osteopenia.  She is currently taking calcium with vitamin D  every day.  She takes care of 2 grandchildren, aged 46 and 2.  She plans to start with light weightlifting to improve muscle tone.  Repeat DEXA scan in June 2027. Continue tamoxifen  daily.  May consider discontinuing if/when she has hysterectomy and bilateral BSO. Labs and follow-up in 1 year, sooner if needed.  The patient understands the plans discussed today and is in agreement with them.  She knows to contact our office if she develops concerns prior to her next appointment.  I provided 25 minutes of face-to-face time during this encounter and > 50% was spent counseling as documented under my assessment and plan.    Carolyn FORBES Lessen, NP  [facility] - A DEPT OF [facility] [address] [place] [address] Dept: [phone] Dept Fax: [phone]   No orders of the defined types were placed in this encounter.     CHIEF COMPLAINT:  CC: Right breast cancer, estrogen receptor positive and LCIS  Current Treatment: Tamoxifen  daily  INTERVAL HISTORY:  Carolyn Glass is here today for repeat clinical assessment.  She was last seen by myself on 04/05/2023.  She had DEXA scan done 02/2024 showing generalized osteopenia.  She is currently taking tamoxifen  which should help to build her bones.  She is also taking calcium with vitamin D  every day.  Vitamin D  level checked as part of today's visit.  Will add weekly Drisdol if  indicated for low vitamin D  level.  She states she recently saw GYN provider.  Had uterine prolapse along with polyps which were removed.  They were biopsied and all were benign.  She she is considering hysterectomy with bilateral BSO, possibly in October 2025.  She also states she was started on Mounjaro  due to elevated blood sugar and Hgb A1c of >9.0.  Blood sugars already improving.  She denies chest pain, chest pressure, or shortness of breath. She denies headaches or visual disturbances. She denies abdominal pain, nausea, vomiting, or changes in bowel or bladder habits.  She denies fevers or chills. She denies pain. Her appetite is good. Her weight has been stable.  I have reviewed the past medical history, past surgical history, social history and family history with the patient and they are unchanged from previous note.  ALLERGIES:  is allergic to corn-containing products, tomato, azithromycin, biaxin [clarithromycin], and codeine.  MEDICATIONS:  Current Outpatient Medications  Medication Sig Dispense Refill   albuterol  (VENTOLIN  HFA) 108 (90 Base) MCG/ACT inhaler Inhale 2 puffs into the lungs every 6 (six) hours as needed for wheezing or shortness of breath. 8.5 g 0   benazepril  (LOTENSIN ) 20 MG tablet TAKE (1) TABLET BY MOUTH TWICE DAILY. 180 tablet 3   Blood Glucose Monitoring Suppl DEVI 1 each by Does not apply route in the morning, at noon, and at bedtime. May substitute to any manufacturer covered by patient's insurance. 1 each 0   Calcium Carb-Cholecalciferol (CALCIUM+D3 PO) Take 1 tablet by mouth daily.     Cyanocobalamin  (B-12 PO) Take 1 capsule by mouth daily.     escitalopram  (LEXAPRO ) 20 MG tablet Take 1 tablet (20 mg total) by mouth daily. 90 tablet 1   fexofenadine (ALLEGRA) 180 MG tablet Take 180 mg by mouth daily.     fluticasone -salmeterol (ADVAIR HFA) 115-21 MCG/ACT inhaler Inhale 2 puffs into the lungs 2 (two) times daily. (Patient taking differently: Inhale 2 puffs into the  lungs 2 (two) times daily as needed (shortness of breath).) 1 each 12   Glucose Blood (BLOOD GLUCOSE TEST STRIPS) STRP 1 each by In Vitro route in the morning, at noon, and at bedtime. May substitute to any manufacturer covered by patient's insurance. 100 strip 3   hydrochlorothiazide  (HYDRODIURIL ) 25 MG tablet Take 1 tablet (25 mg total) by mouth daily. 90 tablet 3   metFORMIN  (GLUCOPHAGE ) 1000 MG tablet TAKE 1 TABLET BY MOUTH TWICE DAILY WITH MEALS. 180 tablet 1   omeprazole  (PRILOSEC) 20 MG capsule TAKE ONE CAPSULE BY MOUTH TWICE DAILY 180 capsule 3   pioglitazone  (ACTOS ) 30 MG tablet Take 1 tablet (30 mg total) by mouth daily. 90 tablet 0   tamoxifen  (NOLVADEX ) 20 MG tablet TAKE (1) TABLET BY MOUTH ONCE DAILY. 90 tablet 0   tirzepatide  (MOUNJARO ) 2.5 MG/0.5ML Pen Inject 2.5 mg into the skin once a week. 2 mL 1   No current facility-administered medications for this visit.    HISTORY OF PRESENT ILLNESS:   Oncology History Overview Note  Malignant neoplasm of lower-outer quadrant of right female breast [facility])   Staging  form: Breast, AJCC 7th Edition   - Clinical stage from 06/23/2016: Stage IA (T1c, N0, M0) - Unsigned  04/05/2023 - currently taking tamoxifen . Goal is to continue through 09/2026. Had to switch from aromisin due to negative side effects  05/31/2018 - bone density test done at [facility] in [place]. She does have osteopenia. Due to have repeat bone density test.     Malignant neoplasm of lower-outer quadrant of right female breast (HCC)  04/28/2015 Imaging   Bone Density Report 04/28/2015 [facility] Spine (L1-L4): T-Score = -1.7. Osteopenia Femoral Neck (Left) : T-Score = -1.0 Normal Femoral Neck (Right): T-Score = 0.4 Normal   03/01/2016 Mammogram   Bilateral screening mammogram was negative   06/08/2016 Imaging   Bilateral breast MRI with and without contrast showed 4 lesions in the right breast, 2 adjacent 1.4 cm mass in the inferior aspect of the right breast, and  in the right lateral aspect of the breast, there are 8 mm and a 7 mm irregular enhancing mass. Multiple small enhancing lesions within the liver, indeterminate. No axillary adenopathy.   06/16/2016 Initial Diagnosis   Malignant neoplasm of lower-outer quadrant of right female breast (HCC)   06/16/2016 Initial Biopsy   Right breast needle core biopsy at 9 clock, 6:30 clock, 6 clock showed invasive lobular carcinoma, and LCIS   06/16/2016 Receptors her2   two right breast biopsy showed ER strongly positive, PR strongly positive, HER-2 negative, Ki-67 5-10%    06/16/2016 Imaging   Right breast and axilla ultrasound showed a 1.4 cm mass at the 6:30 o'clock, 1.0 cm mass at the 6:00, and 8mm mass at 9:00 position. Axilla was negative.   06/28/2016 Imaging   MRI ABDOMEN W W/O CONTRAST 06/28/2016 IMPRESSION: 1. Multiple hepatic hemangiomas.  No suspicious liver lesion. 2. Hepatic steatosis. 3. No evidence of abdominal metastasis.   06/28/2016 Genetic Testing   Patient has genetic testing done for breast cancer. Results revealed patient has the following variant called, MSH2 [id number]>T.   08/13/2016 Surgery   Bilateral simple mastectomy and right SLN biopsy     08/13/2016 Pathology Results   Right mastectomy showed invasive ductal carcinoma, G2, multifocal, 1.5cm, 1.5cm, and 1.0cm. (+) LCIS, margins are negative, 1 sentinel lymph node was negative. Left simple mastectomy showed LCIS, ADH, no malignancy.    08/13/2016 Oncotype testing   Oncotype recurrence score 11, which predicts 10 year distant recurrence risk of 7% with tamoxifen  alone, low risk category. No adjuvant chemo recommended based on this.    09/25/2016 -  Anti-estrogen oral therapy   Adjuvant exemestane  25mg  daily    11/05/2016 Surgery   REMOVAL OF BILATERAL TISSUE EXPANDERS WITH PLACEMENT OF BILATERAL BREAST SALINE IMPLANTS by Dr. Arelia        REVIEW OF SYSTEMS:   Constitutional: Denies fevers, chills or abnormal  weight loss Eyes: Denies blurriness of vision Ears, nose, mouth, throat, and face: Denies mucositis or sore throat Respiratory: Denies cough, dyspnea or wheezes Cardiovascular: Denies palpitation, chest discomfort or lower extremity swelling Gastrointestinal:  Denies nausea, heartburn or change in bowel habits Skin: Denies abnormal skin rashes Lymphatics: Denies new lymphadenopathy or easy bruising Neurological:Denies numbness, tingling or new weaknesses Behavioral/Psych: Mood is stable, no new changes  All other systems were reviewed with the patient and are negative.   VITALS:   Today's Vitals   04/04/24 0907  BP: 132/80  Pulse: 77  Resp: 17  Temp: 97.8 F (36.6 C)  SpO2: 97%  Weight: 162 lb 12.8 oz (  73.8 kg)   Body mass index is 28.84 kg/m.   Wt Readings from Last 3 Encounters:  04/04/24 162 lb 12.8 oz (73.8 kg)  01/05/24 (P) 163 lb (73.9 kg)  12/23/23 165 lb 6.4 oz (75 kg)    Body mass index is 28.84 kg/m.  Performance status (ECOG): 1 - Symptomatic but completely ambulatory  PHYSICAL EXAM:   GENERAL:alert, no distress and comfortable SKIN: skin color, texture, turgor are normal, no rashes or significant lesions EYES: normal, Conjunctiva are pink and non-injected, sclera clear OROPHARYNX:no exudate, no erythema and lips, buccal mucosa, and tongue normal  NECK: supple, thyroid  normal size, non-tender, without nodularity LYMPH:  no palpable lymphadenopathy in the cervical, axillary or inguinal LUNGS: clear to auscultation and percussion with normal breathing effort HEART: regular rate & rhythm and no murmurs and no lower extremity edema ABDOMEN:abdomen soft, non-tender and normal bowel sounds Musculoskeletal:no cyanosis of digits and no clubbing  NEURO: alert & oriented x 3 with fluent speech, no focal motor/sensory deficits BREAST: The patient has had bilateral mastectomy with reconstructive surgery.  There is small, palpable nodule along the upper outer  circumference of the right breast.  This has been present since having reconstructive surgery.  There have been no changes.  She does follow-up with her surgeon every year.  Implant is intact.  No other palpable masses or lesions along the right chest wall.  There is no axillary lymphadenopathy on the right side.  There is intact implant on the left side.  No palpable lumps or masses along the left left chest wall.  There is no axillary lymphadenopathy on the left.  LABORATORY DATA:  I have reviewed the data as listed    Component Value Date/Time   NA 138 04/04/2024 0853   NA 140 06/13/2017 1402   K 3.9 04/04/2024 0853   K 3.9 06/13/2017 1402   CL 100 04/04/2024 0853   CO2 32 04/04/2024 0853   CO2 28 06/13/2017 1402   GLUCOSE 143 (H) 04/04/2024 0853   GLUCOSE 114 06/13/2017 1402   BUN 15 04/04/2024 0853   BUN 11.2 06/13/2017 1402   CREATININE 0.69 04/04/2024 0853   CREATININE 0.63 12/23/2023 0916   CREATININE 0.8 06/13/2017 1402   CALCIUM 9.6 04/04/2024 0853   CALCIUM 9.7 06/13/2017 1402   PROT 7.3 04/04/2024 0853   PROT 7.2 06/13/2017 1402   ALBUMIN 4.2 04/04/2024 0853   ALBUMIN 4.1 06/13/2017 1402   AST 37 04/04/2024 0853   AST 18 06/13/2017 1402   ALT 39 04/04/2024 0853   ALT 22 06/13/2017 1402   ALKPHOS 67 04/04/2024 0853   ALKPHOS 103 06/13/2017 1402   BILITOT 0.4 04/04/2024 0853   BILITOT 0.31 06/13/2017 1402   GFRNONAA >60 04/04/2024 0853   GFRNONAA 93 02/19/2020 1633   GFRAA 108 02/19/2020 1633    Lab Results  Component Value Date   WBC 4.5 04/04/2024   NEUTROABS 2.0 04/04/2024   HGB 13.0 04/04/2024   HCT 39.1 04/04/2024   MCV 94.4 04/04/2024   PLT 239 04/04/2024

## 2024-04-03 NOTE — Assessment & Plan Note (Signed)
 Patient taking Tamoxifen  daily.  -episodes of loose or watery stools in the mornings. Becomes normal after that.  -weight gain over past 3 months.  -generally tolerating well.  -continue for total of 7 to 10 years.  - DEXA scan done in June 2025 showing generalized osteopenia.  She is currently taking calcium and vitamin D  and is very active.  Plans to begin with light weightlifting to improve muscle tone.  Vitamin D  level checked today and will treat deficiency as indicated.  Repeat DEXA scan in June 2027. - Exam is benign today.  She continues to tolerate tamoxifen  well.  Consider discontinuing tamoxifen  following hysterectomy and bilateral BSO.  Plan for labs and follow-up in 1 year, sooner if needed.

## 2024-04-04 ENCOUNTER — Inpatient Hospital Stay: Payer: Medicare PPO | Attending: Nurse Practitioner

## 2024-04-04 ENCOUNTER — Inpatient Hospital Stay (HOSPITAL_BASED_OUTPATIENT_CLINIC_OR_DEPARTMENT_OTHER): Payer: Medicare PPO | Admitting: Nurse Practitioner

## 2024-04-04 ENCOUNTER — Telehealth: Payer: Self-pay

## 2024-04-04 ENCOUNTER — Encounter: Payer: Self-pay | Admitting: Nurse Practitioner

## 2024-04-04 VITALS — BP 132/80 | HR 77 | Temp 97.8°F | Resp 17 | Wt 162.8 lb

## 2024-04-04 DIAGNOSIS — Z79810 Long term (current) use of selective estrogen receptor modulators (SERMs): Secondary | ICD-10-CM | POA: Insufficient documentation

## 2024-04-04 DIAGNOSIS — M8580 Other specified disorders of bone density and structure, unspecified site: Secondary | ICD-10-CM | POA: Diagnosis not present

## 2024-04-04 DIAGNOSIS — C50511 Malignant neoplasm of lower-outer quadrant of right female breast: Secondary | ICD-10-CM | POA: Diagnosis not present

## 2024-04-04 DIAGNOSIS — Z9013 Acquired absence of bilateral breasts and nipples: Secondary | ICD-10-CM | POA: Diagnosis not present

## 2024-04-04 DIAGNOSIS — E119 Type 2 diabetes mellitus without complications: Secondary | ICD-10-CM | POA: Diagnosis not present

## 2024-04-04 DIAGNOSIS — Z170 Estrogen receptor positive status [ER+]: Secondary | ICD-10-CM | POA: Diagnosis not present

## 2024-04-04 DIAGNOSIS — Z7985 Long-term (current) use of injectable non-insulin antidiabetic drugs: Secondary | ICD-10-CM | POA: Diagnosis not present

## 2024-04-04 DIAGNOSIS — N814 Uterovaginal prolapse, unspecified: Secondary | ICD-10-CM | POA: Insufficient documentation

## 2024-04-04 DIAGNOSIS — Z1732 Human epidermal growth factor receptor 2 negative status: Secondary | ICD-10-CM | POA: Insufficient documentation

## 2024-04-04 DIAGNOSIS — Z1721 Progesterone receptor positive status: Secondary | ICD-10-CM | POA: Insufficient documentation

## 2024-04-04 LAB — CMP (CANCER CENTER ONLY)
ALT: 39 U/L (ref 0–44)
AST: 37 U/L (ref 15–41)
Albumin: 4.2 g/dL (ref 3.5–5.0)
Alkaline Phosphatase: 67 U/L (ref 38–126)
Anion gap: 6 (ref 5–15)
BUN: 15 mg/dL (ref 8–23)
CO2: 32 mmol/L (ref 22–32)
Calcium: 9.6 mg/dL (ref 8.9–10.3)
Chloride: 100 mmol/L (ref 98–111)
Creatinine: 0.69 mg/dL (ref 0.44–1.00)
GFR, Estimated: >60 mL/min (ref >60)
Glucose, Bld: 143 mg/dL — ABNORMAL HIGH (ref 70–99)
Potassium: 3.9 mmol/L (ref 3.5–5.1)
Sodium: 138 mmol/L (ref 135–145)
Total Bilirubin: 0.4 mg/dL (ref 0.0–1.2)
Total Protein: 7.3 g/dL (ref 6.5–8.1)

## 2024-04-04 LAB — CBC WITH DIFFERENTIAL (CANCER CENTER ONLY)
Abs Immature Granulocytes: 0.01 K/uL (ref 0.00–0.07)
Basophils Absolute: 0 K/uL (ref 0.0–0.1)
Basophils Relative: 1 %
Eosinophils Absolute: 0.2 K/uL (ref 0.0–0.5)
Eosinophils Relative: 4 %
HCT: 39.1 % (ref 36.0–46.0)
Hemoglobin: 13 g/dL (ref 12.0–15.0)
Immature Granulocytes: 0 %
Lymphocytes Relative: 39 %
Lymphs Abs: 1.7 K/uL (ref 0.7–4.0)
MCH: 31.4 pg (ref 26.0–34.0)
MCHC: 33.2 g/dL (ref 30.0–36.0)
MCV: 94.4 fL (ref 80.0–100.0)
Monocytes Absolute: 0.5 K/uL (ref 0.1–1.0)
Monocytes Relative: 12 %
Neutro Abs: 2 K/uL (ref 1.7–7.7)
Neutrophils Relative %: 44 %
Platelet Count: 239 K/uL (ref 150–400)
RBC: 4.14 MIL/uL (ref 3.87–5.11)
RDW: 13.2 % (ref 11.5–15.5)
WBC Count: 4.5 K/uL (ref 4.0–10.5)
nRBC: 0 % (ref 0.0–0.2)

## 2024-04-04 LAB — VITAMIN D 25 HYDROXY (VIT D DEFICIENCY, FRACTURES): Vit D, 25-Hydroxy: 39.66 ng/mL (ref 30–100)

## 2024-04-04 NOTE — Telephone Encounter (Signed)
 Copied from CRM [id number]. Topic: Clinical - Medication Question >> Apr 04, 2024 11:05 AM Tonda B wrote: Reason for CRM: patient is calling in to see if she can get a device for her A1c and blood sugar please [phone]

## 2024-04-09 ENCOUNTER — Encounter: Payer: Self-pay | Admitting: Obstetrics and Gynecology

## 2024-04-09 ENCOUNTER — Ambulatory Visit: Payer: Self-pay | Admitting: Nurse Practitioner

## 2024-05-07 ENCOUNTER — Other Ambulatory Visit: Payer: Self-pay | Admitting: Family Medicine

## 2024-05-08 NOTE — Telephone Encounter (Signed)
 Requested medication (s) are due for refill today: yes  Requested medication (s) are on the active medication list: yes  Last refill:  03/23/24 2 ml 1 RF  Future visit scheduled: no  Notes to clinic:  med not assigned to a protocol   Requested Prescriptions  Pending Prescriptions Disp Refills   MOUNJARO  2.5 MG/0.5ML Pen [Pharmacy Med Name: Mounjaro  2.5 mg/0.5 mL subcutaneous pen injector] 2 mL 1    Sig: Inject 2.5 mg into the skin once a week.     Off-Protocol Failed - 05/08/2024  2:17 PM      Failed - Medication not assigned to a protocol, review manually.      Passed - Valid encounter within last 12 months    Recent Outpatient Visits           1 month ago Uncontrolled type 2 diabetes mellitus with hyperglycemia (HCC)   [place] [facility] Duanne Butler DASEN, MD   4 months ago Controlled type 2 diabetes mellitus without complication, without long-term current use of insulin  [facility])   [facility] Duanne Butler DASEN, MD   1 year ago Acute bronchitis with asthma with acute exacerbation   [place] [facility] Duanne Butler DASEN, MD   1 year ago Asthma exacerbation, mild   [facility] Kayla Jeoffrey RAMAN, FNP   1 year ago Acute bronchitis with asthma with acute exacerbation    [facility] Kayla Jeoffrey RAMAN, FNP

## 2024-05-14 ENCOUNTER — Telehealth: Payer: Self-pay

## 2024-05-14 ENCOUNTER — Other Ambulatory Visit: Payer: Self-pay | Admitting: Family Medicine

## 2024-05-14 NOTE — Telephone Encounter (Signed)
 Copied from CRM [id number]. Topic: Clinical - Medication Question >> May 14, 2024  3:00 PM Dominique E wrote: Reason for CRM: Pt called reporting that she is ready to increase her dose for Mounjaro . Pt says she was told to call when she was ready to increase her dose strength.   [facility] [place], [place] - [id number] [place] [address] Phone: [phone] Fax: [phone]

## 2024-05-15 ENCOUNTER — Other Ambulatory Visit: Payer: Self-pay | Admitting: Family Medicine

## 2024-05-15 MED ORDER — TIRZEPATIDE 5 MG/0.5ML ~~LOC~~ SOAJ: 5.0000 mg | SUBCUTANEOUS | 3 refills | Status: AC

## 2024-05-18 NOTE — Telephone Encounter (Signed)
 Pt. Was left a vm message to inform of medication sent to pharmacy.

## 2024-06-11 ENCOUNTER — Other Ambulatory Visit: Payer: Self-pay

## 2024-06-11 DIAGNOSIS — F32A Depression, unspecified: Secondary | ICD-10-CM

## 2024-06-11 NOTE — Telephone Encounter (Signed)
 Prescription Request  06/11/2024  LOV: 03/23/24  What is the name of the medication or equipment? escitalopram  (LEXAPRO ) 20 MG tablet [[id number]]   Have you contacted your pharmacy to request a refill? Yes   Which pharmacy would you like this sent to?  [facility] [place], [place] - [id number] [place] [address] Phone: [phone] Fax: [phone]    Patient notified that their request is being sent to the clinical staff for review and that they should receive a response within 2 business days.   Please advise at [facility] [phone]

## 2024-06-13 MED ORDER — ESCITALOPRAM OXALATE 20 MG PO TABS: 20.0000 mg | ORAL_TABLET | Freq: Every day | ORAL | 1 refills | Status: AC

## 2024-06-13 NOTE — Telephone Encounter (Signed)
 Requested Prescriptions  Pending Prescriptions Disp Refills   escitalopram  (LEXAPRO ) 20 MG tablet 90 tablet 1    Sig: Take 1 tablet (20 mg total) by mouth daily.     Psychiatry:  Antidepressants - SSRI Passed - 06/13/2024  9:16 AM      Passed - Valid encounter within last 6 months    Recent Outpatient Visits           2 months ago Uncontrolled type 2 diabetes mellitus with hyperglycemia (HCC)   [facility] Duanne Butler DASEN, MD   5 months ago Controlled type 2 diabetes mellitus without complication, without long-term current use of insulin  [facility])   [facility] Duanne Butler DASEN, MD   1 year ago Acute bronchitis with asthma with acute exacerbation   [place] [facility] Duanne Butler DASEN, MD   1 year ago Asthma exacerbation, mild   [facility] Kayla Jeoffrey RAMAN, FNP   1 year ago Acute bronchitis with asthma with acute exacerbation   [facility] Kayla Jeoffrey RAMAN, FNP

## 2024-06-20 ENCOUNTER — Other Ambulatory Visit

## 2024-06-20 DIAGNOSIS — E1165 Type 2 diabetes mellitus with hyperglycemia: Secondary | ICD-10-CM

## 2024-06-21 ENCOUNTER — Ambulatory Visit: Payer: Self-pay | Admitting: Family Medicine

## 2024-06-21 LAB — HEMOGLOBIN A1C
Hgb A1c MFr Bld: 6.7 % — ABNORMAL HIGH (ref <5.7)
Mean Plasma Glucose: 146 mg/dL
eAG (mmol/L): 8.1 mmol/L

## 2024-06-27 ENCOUNTER — Telehealth: Payer: Self-pay | Admitting: Family Medicine

## 2024-06-27 NOTE — Telephone Encounter (Signed)
 Copied from CRM [id number]. Topic: General - Other >> Jun 26, 2024  2:56 PM Zebedee SAUNDERS wrote: Reason for CRM:  Received call from [facility] per Celine ph: [phone] fax: [phone], regarding Hedis start measure statin request which was fax on 06/20/2024. Please contact Celine by phone or fax.

## 2024-07-04 ENCOUNTER — Other Ambulatory Visit: Payer: Self-pay | Admitting: Family Medicine

## 2024-08-11 ENCOUNTER — Other Ambulatory Visit: Payer: Self-pay | Admitting: Family Medicine

## 2024-08-11 DIAGNOSIS — K219 Gastro-esophageal reflux disease without esophagitis: Secondary | ICD-10-CM

## 2024-08-20 ENCOUNTER — Telehealth: Payer: Self-pay | Admitting: Pharmacy Technician

## 2024-08-20 ENCOUNTER — Other Ambulatory Visit (HOSPITAL_COMMUNITY): Payer: Self-pay

## 2024-08-20 NOTE — Telephone Encounter (Signed)
 Pharmacy Patient Advocate Encounter   Received notification from Onbase that prior authorization for Mounjaro  2.5MG /0.5ML auto-injectors  is due for renewal.   Insurance verification completed.   The patient is insured through [employer].

## 2024-08-23 ENCOUNTER — Other Ambulatory Visit: Payer: Self-pay | Admitting: Family Medicine

## 2024-08-28 ENCOUNTER — Ambulatory Visit: Payer: Self-pay

## 2024-08-28 ENCOUNTER — Encounter: Payer: Self-pay | Admitting: Family Medicine

## 2024-08-28 ENCOUNTER — Ambulatory Visit: Admitting: Family Medicine

## 2024-08-28 VITALS — BP 106/64 | HR 98 | Temp 98.7°F | Ht 63.0 in | Wt 151.0 lb

## 2024-08-28 DIAGNOSIS — J069 Acute upper respiratory infection, unspecified: Secondary | ICD-10-CM

## 2024-08-28 NOTE — Telephone Encounter (Signed)
 FYI Only or Action Required?: FYI only for provider: appointment scheduled on 08/28/24.  Patient was last seen in primary care on 03/23/2024 by Duanne Butler DASEN, MD.  Called Nurse Triage reporting Cough.  Symptoms began several days ago.  Interventions attempted: OTC medications: tylenol , mucinex.  Symptoms are: unchanged.  Triage Disposition: See Physician Within 24 Hours  Patient/caregiver understands and will follow disposition?: Yes   Copied from CRM #[id number]. Topic: Clinical - Red Word Triage >> Aug 28, 2024  9:27 AM Carolyn Glass wrote: Carolyn Glass that prompted transfer to Nurse Triage: Pt calling states she some shortness of breath, nasal and chest congestion, increased coughing, with colored yellow mucous,.  Pt also reports she also has asthma so that triggers her asthma.   Pt ph. [phone] Reason for Disposition  [1] Continuous (nonstop) coughing interferes with work or school AND [2] no improvement using cough treatment per Care Advice  Answer Assessment - Initial Assessment Questions Scheduled appt with alt prov 08/28/24.  Advised call back or ED/911 if symptoms worsen. Patient verbalized understanding.  1. ONSET: When did the cough begin?      Sunday 2. SEVERITY: How bad is the cough today?      mild 3. SPUTUM: Describe the color of your sputum (e.g., none, dry cough; clear, white, yellow, green)     yellow 4. HEMOPTYSIS: Are you coughing up any blood? If Yes, ask: How much? (e.g., flecks, streaks, tablespoons, etc.)     denies 5. DIFFICULTY BREATHING: Are you having difficulty breathing? If Yes, ask: How bad is it? (e.g., mild, moderate, severe)      none 6. FEVER: Do you have a fever? If Yes, ask: What is your temperature, how was it measured, and when did it start?     10 1.2;last temp, tylenol ; otc mucinex; has not rechecked temp, no use of rescue inhaler Denies chills/ n/v/d 7. CARDIAC HISTORY: Do you have any history of heart disease?  (e.g., heart attack, congestive heart failure)      no 8. LUNG HISTORY: Do you have any history of lung disease?  (e.g., pulmonary embolus, asthma, emphysema)     asthma 9. PE RISK FACTORS: Do you have a history of blood clots? (or: recent major surgery, recent prolonged travel, bedridden)     no 10. OTHER SYMPTOMS: Do you have any other symptoms? (e.g., runny nose, wheezing, chest pain) Runny nose, sinus HA denies  Protocols used: Cough - Acute Productive-A-AH

## 2024-08-28 NOTE — Progress Notes (Signed)
 "  Patient Office Visit  Assessment & Plan:  Upper respiratory virus -     SARS-CoV-2 RNA, Influenza A/B, and RSV RNA, Qualitative NAAT   Assessment and Plan    Acute upper respiratory infection Suspected viral etiology, likely RSV or flu. No bacterial infection suspected. Discussed potential for viral to bacterial progression. - Ordered respiratory panel for RSV, flu, and other viruses. - Continue Mucinex for symptoms. - Consider Tessalon  Perles for cough. - Use albuterol  inhaler as needed. - Advised mask use to prevent spread. - Monitor symptoms and report worsening via MyChart.  Asthma Well-controlled, no recent exacerbations. - Continue Advair as maintenance. - Use albuterol  inhaler as needed.  Hypertension Blood pressure low, likely due to weight loss from Mounjaro . Discussed medication adjustment. - Reduce hydrochlorothiazide  by half, monitor blood pressure. - Continue benazepril . - Monitor for hypotension symptoms.       No follow-ups on file.   Subjective:    Patient ID: Carolyn Glass, female    DOB: Oct 06, 1955  Age: 68 y.o. MRN: [id number]  Chief Complaint  Patient presents with   Nasal Congestion    Associated with headache, cough and wheezing since Sunday.   Fever    101 fever yesterday afternoon.    Fever    Discussed the use of AI scribe software for clinical note transcription with the patient, who gave verbal consent to proceed.  History of Present Illness        History of Present Illness Carolyn Glass is a 68 year old female with asthma and diabetes who presents with respiratory symptoms including cough and congestion.  She has been experiencing symptoms since Sunday, including a runny nose, cough, and congestion. No significant wheezing and she has not used her inhaler recently. She had a fever last night and is concerned about the possibility of having the flu, although she has received the flu shot this season.  Her asthma is usually  well-controlled. She uses Advair as a maintenance inhaler and has an albuterol  inhaler on hand, though she has not needed to use it recently.  She has a history of breast cancer, for which she has been taking tamoxifen  for nearly ten years as a preventative measure. She is also diabetic and is currently taking Actos  and metformin  for management.  She has been taking Mucinex for her current symptoms, which seems to help break up congestion. Occasional sinus pressure but not severe. She is concerned about the potential for bronchitis, as she has been prone to it in the past.  She is a non-smoker but lives with her husband who smokes. She has not been around anyone known to be sick recently but is aware of the prevalence of respiratory illnesses in the community.  She is also managing her blood pressure, which has been low recently, possibly due to weight loss from taking Mounjaro .  Physical Exam HEENT: Throat normal, no redness. CHEST: Lungs clear to auscultation bilaterally.  Assessment and Plan Acute upper respiratory infection Suspected viral etiology, likely RSV or flu. No bacterial infection suspected. Discussed potential for viral to bacterial progression. - Ordered respiratory panel for RSV, flu, and other viruses. - Continue Mucinex for symptoms. - Consider Tessalon  Perles for cough. - Use albuterol  inhaler as needed. - Advised mask use to prevent spread. - Monitor symptoms and report worsening via MyChart.  Asthma Well-controlled, no recent exacerbations. - Continue Advair as maintenance. - Use albuterol  inhaler as needed.  Hypertension Blood pressure low, likely due to weight loss  from Mounjaro . Discussed medication adjustment. - Reduce hydrochlorothiazide  by half, monitor blood pressure. - Continue benazepril . - Monitor for hypotension symptoms.   The 10-year ASCVD risk score (Arnett DK, et al., 2019) is: 14.4%  Past Medical History:  Diagnosis Date   Anxiety     Arthritis    OA hips   Asthma    allergy and exercise induced asthma    Depression    Diabetes mellitus without complication (HCC)    type 2, does not check blood sugar   Family history of breast cancer    Family history of colon cancer    Family history of stomach cancer    GERD (gastroesophageal reflux disease)    Hypertension    Malignant neoplasm of lower-outer quadrant of right female breast (HCC) 06/21/2016   Seasonal allergies    Uterine fibroid 11/2023   Past Surgical History:  Procedure Laterality Date   BREAST LUMPECTOMY     right    BREAST RECONSTRUCTION WITH PLACEMENT OF TISSUE EXPANDER AND FLEX HD (ACELLULAR HYDRATED DERMIS) Bilateral 08/13/2016   Procedure: BREAST RECONSTRUCTION WITH PLACEMENT OF TISSUE EXPANDER AND ALLODERM;  Surgeon: Earlis Ranks, MD;  Location: [facility];  Service: Plastics;  Laterality: Bilateral;   COLONOSCOPY N/A 10/31/2017   Procedure: COLONOSCOPY;  Surgeon: Harvey Margo CROME, MD;  [facility];  Service: Endoscopy;  Laterality: N/A;  8:30 Am   DILATATION & CURRETTAGE/HYSTEROSCOPY WITH RESECTOCOPE N/A 01/05/2024   Procedure: DILATATION & CURETTAGE/HYSTEROSCOPY WITH RESECTOCOPE;  Surgeon: Leva Rush, MD;  Location: [facility];  Service: Gynecology;  Laterality: N/A;  POSSIBLE MYOSURE   DILATION AND CURETTAGE OF UTERUS     JOINT REPLACEMENT     LIPOSUCTION WITH LIPOFILLING Bilateral 11/05/2016   Procedure: LIPOFILLING FROM ABDOMEN TO BILATERAL CHEST;  Surgeon: Earlis Ranks, MD;  Location: [facility];  Service: Plastics;  Laterality: Bilateral;   MASTECTOMY W/ SENTINEL NODE BIOPSY Bilateral 08/13/2016   Procedure: BILATERAL TOTAL MASTECTOMY WITH RIGHT AXILLARY SENTINEL LYMPH NODE BIOPSY;  Surgeon: Morene Olives, MD;  Location: [facility];  Service: General;  Laterality: Bilateral;   REMOVAL OF BILATERAL TISSUE EXPANDERS WITH PLACEMENT OF BILATERAL BREAST IMPLANTS Bilateral 11/05/2016    Procedure: REMOVAL OF BILATERAL TISSUE EXPANDERS WITH PLACEMENT OF BILATERAL BREAST SALINE IMPLANTS;  Surgeon: Earlis Ranks, MD;  Location:  SURGERY CENTER;  Service: Plastics;  Laterality: Bilateral;   TOTAL HIP ARTHROPLASTY Right 01/07/2016   Procedure: TOTAL HIP ARTHROPLASTY ANTERIOR APPROACH;  Surgeon: Dempsey Moan, MD;  Location: [facility];  Service: Orthopedics;  Laterality: Right;   TOTAL HIP ARTHROPLASTY Left 03/22/2018   Procedure: LEFT TOTAL HIP ARTHROPLASTY ANTERIOR APPROACH;  Surgeon: Moan Dempsey, MD;  Location: [facility];  Service: Orthopedics;  Laterality: Left;   Social History[1] Family History  Problem Relation Age of Onset   Arthritis Mother    Asthma Mother    Diabetes Mother    Hyperlipidemia Mother    Hypertension Mother    Arthritis Father    Asthma Father    Stroke Father    Hyperlipidemia Father    Early death Brother 8       Muscular Dystrophy   Birth defects Brother    Cancer Maternal Uncle        possibly stomach   Mental retardation Maternal Uncle    Cancer Paternal Grandmother 60       breast cancer    Cancer Cousin 33       paternal cousin's daughter with breast  cancer   Colon cancer Maternal Aunt        dx in her 55s   Leukemia Paternal Uncle    Lymphoma Paternal Uncle    Down syndrome Paternal Uncle    Allergies[2]  Review of Systems  Constitutional:  Positive for fever.      Objective:    BP 106/64   Pulse 98   Temp 98.7 F (37.1 C)   Ht 5' 3 (1.6 m)   Wt 151 lb (68.5 kg)   SpO2 98%   BMI 26.75 kg/m  BP Readings from Last 3 Encounters:  08/28/24 106/64  04/04/24 132/80  03/23/24 118/70   Wt Readings from Last 3 Encounters:  08/28/24 151 lb (68.5 kg)  04/04/24 162 lb 12.8 oz (73.8 kg)  01/05/24 (P) 163 lb (73.9 kg)    Physical Exam Vitals reviewed.  Constitutional:      General: She is not in acute distress. HENT:     Head: Normocephalic.     Nose: Congestion present.     Mouth/Throat:     Pharynx: No  oropharyngeal exudate or posterior oropharyngeal erythema.  Cardiovascular:     Rate and Rhythm: Tachycardia present.  Pulmonary:     Effort: Pulmonary effort is normal.     Breath sounds: No wheezing or rhonchi.      No results found for any visits on 08/28/24.          [1]  Social History Tobacco Use   Smoking status: Never   Smokeless tobacco: Never  Vaping Use   Vaping status: Never Used  Substance Use Topics   Alcohol use: No   Drug use: No  [2]  Allergies Allergen Reactions   Corn-Containing Products Itching    Running nose   Tomato Itching    Running nose    Azithromycin Itching and Rash   Biaxin [Clarithromycin] Rash   Codeine Rash    Thinks a codeine cough syrup caused rash.   "

## 2024-08-29 ENCOUNTER — Encounter: Payer: Self-pay | Admitting: Family Medicine

## 2024-09-04 ENCOUNTER — Other Ambulatory Visit: Payer: Self-pay

## 2024-09-04 ENCOUNTER — Telehealth: Payer: Self-pay

## 2024-09-04 NOTE — Telephone Encounter (Signed)
 Received telephone call from our switchboard operator on behalf of the patient requesting refill on Tamoxifen  be sent to [facility]. Let caller know that I would forward her message request to the provider.

## 2024-09-05 ENCOUNTER — Ambulatory Visit: Payer: Self-pay | Admitting: Family Medicine

## 2024-09-05 ENCOUNTER — Other Ambulatory Visit: Payer: Self-pay | Admitting: Nurse Practitioner

## 2024-09-05 LAB — SARS-COV-2 RNA, INFLUENZA A/B, AND RSV RNA, QUALITATIVE NAAT
INFLUENZA A RNA: DETECTED — AB
INFLUENZA B RNA: NOT DETECTED
RSV RNA: NOT DETECTED
SARS COV2 RNA: NOT DETECTED

## 2024-09-05 MED ORDER — TAMOXIFEN CITRATE 20 MG PO TABS: 20.0000 mg | ORAL_TABLET | Freq: Every day | ORAL | 1 refills | Status: AC

## 2024-10-05 ENCOUNTER — Other Ambulatory Visit: Payer: Self-pay | Admitting: Family Medicine

## 2024-10-10 ENCOUNTER — Other Ambulatory Visit: Payer: Self-pay | Admitting: Family Medicine

## 2024-12-20 ENCOUNTER — Encounter

## 2025-04-04 ENCOUNTER — Ambulatory Visit: Admitting: Hematology

## 2025-04-04 ENCOUNTER — Other Ambulatory Visit
